# Patient Record
Sex: Male | Born: 1960 | Race: White | Hispanic: No | Marital: Single | Smoking: Never smoker
Health system: Southern US, Community
[De-identification: ages and names within clinical notes are randomized; demographics above are authoritative.]

## PROBLEM LIST (undated history)

## (undated) DIAGNOSIS — N320 Bladder-neck obstruction: Secondary | ICD-10-CM

## (undated) DIAGNOSIS — F329 Major depressive disorder, single episode, unspecified: Secondary | ICD-10-CM

## (undated) DIAGNOSIS — F419 Anxiety disorder, unspecified: Secondary | ICD-10-CM

## (undated) DIAGNOSIS — K219 Gastro-esophageal reflux disease without esophagitis: Secondary | ICD-10-CM

## (undated) DIAGNOSIS — K589 Irritable bowel syndrome without diarrhea: Secondary | ICD-10-CM

## (undated) DIAGNOSIS — F840 Autistic disorder: Secondary | ICD-10-CM

## (undated) DIAGNOSIS — J45909 Unspecified asthma, uncomplicated: Secondary | ICD-10-CM

## (undated) DIAGNOSIS — I10 Essential (primary) hypertension: Secondary | ICD-10-CM

## (undated) DIAGNOSIS — E785 Hyperlipidemia, unspecified: Secondary | ICD-10-CM

## (undated) DIAGNOSIS — J189 Pneumonia, unspecified organism: Secondary | ICD-10-CM

## (undated) DIAGNOSIS — G4733 Obstructive sleep apnea (adult) (pediatric): Secondary | ICD-10-CM

## (undated) DIAGNOSIS — D638 Anemia in other chronic diseases classified elsewhere: Secondary | ICD-10-CM

## (undated) DIAGNOSIS — N390 Urinary tract infection, site not specified: Secondary | ICD-10-CM

## (undated) DIAGNOSIS — K8020 Calculus of gallbladder without cholecystitis without obstruction: Secondary | ICD-10-CM

## (undated) DIAGNOSIS — E669 Obesity, unspecified: Secondary | ICD-10-CM

## (undated) DIAGNOSIS — F32A Depression, unspecified: Secondary | ICD-10-CM

## (undated) HISTORY — DX: Irritable bowel syndrome, unspecified: K58.9

## (undated) HISTORY — DX: Obstructive sleep apnea (adult) (pediatric): G47.33

## (undated) HISTORY — DX: Unspecified asthma, uncomplicated: J45.909

## (undated) HISTORY — DX: Anxiety disorder, unspecified: F41.9

## (undated) HISTORY — DX: Pneumonia, unspecified organism: J18.9

## (undated) HISTORY — DX: Obesity, unspecified: E66.9

## (undated) HISTORY — DX: Major depressive disorder, single episode, unspecified: F32.9

## (undated) HISTORY — DX: Calculus of gallbladder without cholecystitis without obstruction: K80.20

## (undated) HISTORY — DX: Essential (primary) hypertension: I10

## (undated) HISTORY — DX: Gastro-esophageal reflux disease without esophagitis: K21.9

## (undated) HISTORY — DX: Depression, unspecified: F32.A

## (undated) HISTORY — DX: Urinary tract infection, site not specified: N39.0

## (undated) HISTORY — DX: Hyperlipidemia, unspecified: E78.5

---

## 2004-05-14 ENCOUNTER — Ambulatory Visit: Payer: Self-pay | Admitting: Internal Medicine

## 2004-10-08 ENCOUNTER — Ambulatory Visit: Payer: Self-pay | Admitting: Internal Medicine

## 2005-03-25 ENCOUNTER — Ambulatory Visit: Payer: Self-pay | Admitting: Internal Medicine

## 2005-10-07 ENCOUNTER — Ambulatory Visit: Payer: Self-pay | Admitting: Internal Medicine

## 2006-04-07 ENCOUNTER — Ambulatory Visit: Payer: Self-pay | Admitting: Internal Medicine

## 2006-05-22 ENCOUNTER — Emergency Department (HOSPITAL_COMMUNITY): Admission: EM | Admit: 2006-05-22 | Discharge: 2006-05-22 | Payer: Self-pay | Admitting: Emergency Medicine

## 2006-07-21 ENCOUNTER — Ambulatory Visit: Payer: Self-pay | Admitting: Internal Medicine

## 2006-07-21 LAB — CONVERTED CEMR LAB
ALT: 17 units/L (ref 0–53)
Albumin: 3.7 g/dL (ref 3.5–5.2)
Alkaline Phosphatase: 82 units/L (ref 39–117)
BUN: 15 mg/dL (ref 6–23)
Basophils Absolute: 0 10*3/uL (ref 0.0–0.1)
Bilirubin, Direct: 0.1 mg/dL (ref 0.0–0.3)
CO2: 28 meq/L (ref 19–32)
Calcium: 9 mg/dL (ref 8.4–10.5)
Chloride: 107 meq/L (ref 96–112)
Cholesterol: 204 mg/dL (ref 0–200)
Eosinophils Absolute: 0.3 10*3/uL (ref 0.0–0.6)
Eosinophils Relative: 3.1 % (ref 0.0–5.0)
GFR calc Af Amer: 103 mL/min
Lymphocytes Relative: 16.5 % (ref 12.0–46.0)
Monocytes Absolute: 1.2 10*3/uL — ABNORMAL HIGH (ref 0.2–0.7)
Monocytes Relative: 14.2 % — ABNORMAL HIGH (ref 3.0–11.0)
Neutro Abs: 5.7 10*3/uL (ref 1.4–7.7)
PSA: 0.51 ng/mL (ref 0.10–4.00)
RBC: 4.5 M/uL (ref 4.22–5.81)
Sodium: 141 meq/L (ref 135–145)
TSH: 0.8 microintl units/mL (ref 0.35–5.50)
Total Bilirubin: 0.8 mg/dL (ref 0.3–1.2)
Total CHOL/HDL Ratio: 6

## 2007-02-16 ENCOUNTER — Ambulatory Visit: Payer: Self-pay | Admitting: Internal Medicine

## 2007-02-16 DIAGNOSIS — R079 Chest pain, unspecified: Secondary | ICD-10-CM | POA: Insufficient documentation

## 2007-02-16 DIAGNOSIS — G4710 Hypersomnia, unspecified: Secondary | ICD-10-CM | POA: Insufficient documentation

## 2007-02-16 DIAGNOSIS — J309 Allergic rhinitis, unspecified: Secondary | ICD-10-CM | POA: Insufficient documentation

## 2007-02-16 DIAGNOSIS — I10 Essential (primary) hypertension: Secondary | ICD-10-CM | POA: Insufficient documentation

## 2007-02-16 DIAGNOSIS — E785 Hyperlipidemia, unspecified: Secondary | ICD-10-CM | POA: Insufficient documentation

## 2007-02-16 DIAGNOSIS — F3289 Other specified depressive episodes: Secondary | ICD-10-CM | POA: Insufficient documentation

## 2007-02-16 DIAGNOSIS — F411 Generalized anxiety disorder: Secondary | ICD-10-CM | POA: Insufficient documentation

## 2007-02-16 DIAGNOSIS — K219 Gastro-esophageal reflux disease without esophagitis: Secondary | ICD-10-CM | POA: Insufficient documentation

## 2007-02-16 DIAGNOSIS — M722 Plantar fascial fibromatosis: Secondary | ICD-10-CM | POA: Insufficient documentation

## 2007-02-16 DIAGNOSIS — F329 Major depressive disorder, single episode, unspecified: Secondary | ICD-10-CM

## 2007-02-27 ENCOUNTER — Encounter: Payer: Self-pay | Admitting: Pulmonary Disease

## 2008-05-02 ENCOUNTER — Encounter (INDEPENDENT_AMBULATORY_CARE_PROVIDER_SITE_OTHER): Payer: Self-pay | Admitting: *Deleted

## 2008-05-02 ENCOUNTER — Ambulatory Visit: Payer: Self-pay | Admitting: Internal Medicine

## 2008-05-02 LAB — CONVERTED CEMR LAB
AST: 20 units/L (ref 0–37)
Albumin: 4 g/dL (ref 3.5–5.2)
Alkaline Phosphatase: 76 units/L (ref 39–117)
BUN: 17 mg/dL (ref 6–23)
Basophils Relative: 0.5 % (ref 0.0–3.0)
Bilirubin Urine: NEGATIVE
Bilirubin, Direct: 0 mg/dL (ref 0.0–0.3)
CO2: 28 meq/L (ref 19–32)
Chloride: 108 meq/L (ref 96–112)
Cholesterol: 178 mg/dL (ref 0–200)
Creatinine, Ser: 1 mg/dL (ref 0.4–1.5)
Eosinophils Absolute: 0.2 10*3/uL (ref 0.0–0.7)
Eosinophils Relative: 3.5 % (ref 0.0–5.0)
GFR calc non Af Amer: 84.66 mL/min (ref >60)
HDL: 32.4 mg/dL — ABNORMAL LOW (ref >39.00)
Hemoglobin, Urine: NEGATIVE
LDL Cholesterol: 107 mg/dL — ABNORMAL HIGH (ref 0–99)
Lymphs Abs: 1.4 10*3/uL (ref 0.7–4.0)
MCV: 86.4 fL (ref 78.0–100.0)
Neutro Abs: 3 10*3/uL (ref 1.4–7.7)
Nitrite: NEGATIVE
PSA: 0.35 ng/mL (ref 0.10–4.00)
Specific Gravity, Urine: 1.025 (ref 1.000–1.030)
TSH: 1.01 microintl units/mL (ref 0.35–5.50)
Total Bilirubin: 0.7 mg/dL (ref 0.3–1.2)
Total Protein, Urine: NEGATIVE mg/dL
Total Protein: 7.4 g/dL (ref 6.0–8.3)
pH: 5.5 (ref 5.0–8.0)

## 2008-06-11 ENCOUNTER — Inpatient Hospital Stay (HOSPITAL_COMMUNITY): Admission: EM | Admit: 2008-06-11 | Discharge: 2008-06-12 | Payer: Self-pay | Admitting: Emergency Medicine

## 2008-06-11 ENCOUNTER — Ambulatory Visit: Payer: Self-pay | Admitting: Internal Medicine

## 2008-07-04 ENCOUNTER — Ambulatory Visit: Payer: Self-pay | Admitting: Internal Medicine

## 2008-11-07 ENCOUNTER — Ambulatory Visit: Payer: Self-pay | Admitting: Internal Medicine

## 2009-06-20 ENCOUNTER — Ambulatory Visit: Payer: Self-pay | Admitting: Internal Medicine

## 2009-06-20 DIAGNOSIS — R1011 Right upper quadrant pain: Secondary | ICD-10-CM | POA: Insufficient documentation

## 2009-06-20 LAB — CONVERTED CEMR LAB
ALT: 21 units/L (ref 0–53)
Alkaline Phosphatase: 65 units/L (ref 39–117)
Basophils Absolute: 0 10*3/uL (ref 0.0–0.1)
Bilirubin Urine: NEGATIVE
CO2: 28 meq/L (ref 19–32)
Chloride: 101 meq/L (ref 96–112)
Cholesterol: 212 mg/dL — ABNORMAL HIGH (ref 0–200)
Creatinine, Ser: 0.9 mg/dL (ref 0.4–1.5)
Direct LDL: 129.1 mg/dL
Eosinophils Absolute: 0.2 10*3/uL (ref 0.0–0.7)
Eosinophils Relative: 3.6 % (ref 0.0–5.0)
Glucose, Bld: 76 mg/dL (ref 70–99)
Hemoglobin, Urine: NEGATIVE
Lymphocytes Relative: 31.1 % (ref 12.0–46.0)
Lymphs Abs: 2 10*3/uL (ref 0.7–4.0)
MCV: 87.5 fL (ref 78.0–100.0)
Neutro Abs: 3.5 10*3/uL (ref 1.4–7.7)
Platelets: 297 10*3/uL (ref 150.0–400.0)
Potassium: 4 meq/L (ref 3.5–5.1)
RBC: 4.66 M/uL (ref 4.22–5.81)
RDW: 13.4 % (ref 11.5–14.6)
Sodium: 139 meq/L (ref 135–145)
Specific Gravity, Urine: 1.025 (ref 1.000–1.030)
TSH: 1.43 microintl units/mL (ref 0.35–5.50)
Urobilinogen, UA: 0.2 (ref 0.0–1.0)
WBC: 6.5 10*3/uL (ref 4.5–10.5)

## 2009-07-05 ENCOUNTER — Ambulatory Visit: Payer: Self-pay | Admitting: Pulmonary Disease

## 2009-07-06 ENCOUNTER — Telehealth: Payer: Self-pay | Admitting: Internal Medicine

## 2009-07-10 ENCOUNTER — Ambulatory Visit: Payer: Self-pay | Admitting: Internal Medicine

## 2009-07-17 ENCOUNTER — Ambulatory Visit: Payer: Self-pay | Admitting: Internal Medicine

## 2009-08-14 ENCOUNTER — Encounter: Payer: Self-pay | Admitting: Pulmonary Disease

## 2009-08-14 ENCOUNTER — Ambulatory Visit (HOSPITAL_BASED_OUTPATIENT_CLINIC_OR_DEPARTMENT_OTHER): Admission: RE | Admit: 2009-08-14 | Discharge: 2009-08-14 | Payer: Self-pay | Admitting: Pulmonary Disease

## 2009-08-14 DIAGNOSIS — G4733 Obstructive sleep apnea (adult) (pediatric): Secondary | ICD-10-CM | POA: Insufficient documentation

## 2009-08-21 ENCOUNTER — Ambulatory Visit: Payer: Self-pay | Admitting: Internal Medicine

## 2009-08-23 ENCOUNTER — Ambulatory Visit: Payer: Self-pay | Admitting: Pulmonary Disease

## 2009-09-04 ENCOUNTER — Ambulatory Visit: Payer: Self-pay | Admitting: Internal Medicine

## 2009-09-15 ENCOUNTER — Emergency Department (HOSPITAL_COMMUNITY): Admission: EM | Admit: 2009-09-15 | Discharge: 2009-09-16 | Payer: Self-pay | Admitting: Emergency Medicine

## 2009-10-02 ENCOUNTER — Ambulatory Visit: Payer: Self-pay | Admitting: Internal Medicine

## 2009-11-28 ENCOUNTER — Ambulatory Visit: Payer: Self-pay | Admitting: Internal Medicine

## 2010-02-11 ENCOUNTER — Encounter: Payer: Self-pay | Admitting: Internal Medicine

## 2010-02-16 ENCOUNTER — Emergency Department (HOSPITAL_COMMUNITY)
Admission: EM | Admit: 2010-02-16 | Discharge: 2010-02-17 | Payer: Self-pay | Source: Home / Self Care | Admitting: Emergency Medicine

## 2010-02-21 NOTE — Assessment & Plan Note (Signed)
Summary: depression/offered sda - pt requested this date/cd   Vital Signs:  Patient profile:   50 year old male Height:      70 inches Weight:      254 pounds BMI:     36.58 O2 Sat:      95 % on Room air Temp:     97.5 degrees F oral Pulse rate:   85 / minute BP sitting:   122 / 80  (left arm) Cuff size:   large  Vitals Entered ByZella Ball Ewing (Jun 20, 2009 10:56 AM)  O2 Flow:  Room air  CC: Depression/RE   CC:  Depression/RE.  History of Present Illness: out of sertraline for approx 1 mo, as unbeknownst to [place] [facility] (mother goes there too);  so depressive symtpoms worsened as he was out of the med; overall good compliacne and tolerance in the past;    here with wt gain, and also pain to RUQ for at least 2 months, mild, intermittent, worse to lie on the right side, currently sleeping on the floor of his one BR apt he shares with mother; no n/v, wt loss, bowel change, or GU symtpoms such as freq, urgency, dysuria or hematuria; no fever, trauma, wt loss, night sweats, or other constitutional symtpoms  Pt denies CP, sob, doe, wheezing, orthopnea, pnd, worsening LE edema, palps, dizziness or syncope   Pt denies new neuro symptoms such as headache, facial or extremity weakness    Preventive Screening-Counseling & Management      Drug Use:  no.    Problems Prior to Update: 1)  Ruq Pain  (ICD-789.01) 2)  Plantar Fasciitis, Left  (ICD-728.71) 3)  Preventive Health Care  (ICD-V70.0) 4)  Fasciitis, Plantar  (ICD-728.71) 5)  Allergic Rhinitis  (ICD-477.9) 6)  Chest Pain  (ICD-786.50) 7)  Hypersomnia  (ICD-780.54) 8)  Hypertension  (ICD-401.9) 9)  Family History of Alcoholism/addiction  (ICD-V61.41) 10)  Anxiety  (ICD-300.00) 11)  Hyperlipidemia  (ICD-272.4) 12)  Gerd  (ICD-530.81) 13)  Depression  (ICD-311)  Medications Prior to Update: 1)  Alprazolam 0.5 Mg  Tabs (Alprazolam) .Marland Kitchen.. 1 By Mouth Two Times A Day As Needed 2)  Sertraline Hcl 100  Mg Tabs (Sertraline Hcl) .... 2 By Mouth Once Daily 3)  Meloxicam 15 Mg Tabs (Meloxicam) .... Take 1 Tablet By Mouth Once A Day As Needed Pain 4)  Lisinopril 20 Mg  Tabs (Lisinopril) .Marland Kitchen.. 1 By Mouth Once Daily 5)  Fluticasone Propionate 50 Mcg/act  Susp (Fluticasone Propionate) .... 2 Spray/side Once Daily For Allergies 6)  Simvastatin 40 Mg  Tabs (Simvastatin) .Marland Kitchen.. 1 By Mouth Once Daily 7)  Adult Aspirin Ec Low Strength 81 Mg Tbec (Aspirin) .Marland Kitchen.. 1 By Mouth Once Daily  Current Medications (verified): 1)  Alprazolam 0.5 Mg  Tabs (Alprazolam) .Marland Kitchen.. 1 By Mouth Two Times A Day As Needed 2)  Sertraline Hcl 100 Mg Tabs (Sertraline Hcl) .... 2 By Mouth Once Daily 3)  Meloxicam 15 Mg Tabs (Meloxicam) .... Take 1 Tablet By Mouth Once A Day As Needed Pain 4)  Lisinopril 20 Mg  Tabs (Lisinopril) .Marland Kitchen.. 1 By Mouth Once Daily 5)  Fluticasone Propionate 50 Mcg/act  Susp (Fluticasone Propionate) .... 2 Spray/side Once Daily For Allergies 6)  Simvastatin 40 Mg  Tabs (Simvastatin) .Marland Kitchen.. 1 By Mouth Once Daily 7)  Adult Aspirin Ec Low Strength 81 Mg Tbec (Aspirin) .Marland Kitchen.. 1 By Mouth Once Daily  Allergies (verified): 1)  Prozac  Past History:  Past Medical History: Last updated: 02/16/2007 Depression GERD Hyperlipidemia Anxiety Hypertension Allergic rhinitis  Past Surgical History: Last updated: 02/16/2007 Denies surgical history  Family History: Last updated: 02/16/2007 Family History of Alcoholism/Addiction Family History of Arthritis breast cancer prostate cancer Family History High cholesterol heart disease stroke kidney disease DM pancreatic cancer - uncle  Social History: Last updated: 06/20/2009 Never Smoked Alcohol use-no Single no children Drug use-no  Risk Factors: Smoking Status: never (02/16/2007)  Social History: Reviewed history from 02/16/2007 and no changes required. Never Smoked Alcohol use-no Single no children Drug use-no Drug Use:  no  Review of  Systems  The patient denies anorexia, fever, vision loss, decreased hearing, hoarseness, chest pain, syncope, dyspnea on exertion, peripheral edema, prolonged cough, headaches, hemoptysis, melena, hematochezia, severe indigestion/heartburn, hematuria, muscle weakness, suspicious skin lesions, transient blindness, difficulty walking, unusual weight change, abnormal bleeding, enlarged lymph nodes, and angioedema.         all otherwise negative per pt -    Physical Exam  General:  alert and overweight-appearing.   Head:  normocephalic and atraumatic.   Eyes:  vision grossly intact, pupils equal, and pupils round.   Ears:  R ear normal and L ear normal.   Nose:  no external deformity and no nasal discharge.   Mouth:  no gingival abnormalities and pharynx pink and moist.   Neck:  supple and no masses.   Lungs:  normal respiratory effort and normal breath sounds.   Heart:  normal rate and regular rhythm.   Abdomen:  soft, non-tender, and normal bowel sounds.  except for midl ruq /costal tenderness Msk:  no joint tenderness and no joint swelling.   Extremities:  no edema, no erythema  Neurologic:  cranial nerves II-XII intact and strength normal in all extremities.   Skin:  color normal and no rashes.   Psych:  depressed affect and moderately anxious.     Impression & Recommendations:  Problem # 1:  Preventive Health Care (ICD-V70.0)  Overall doing well, age appropriate education and counseling updated and referral for appropriate preventive services done unless declined, immunizations up to date or declined, diet counseling done if overweight, urged to quit smoking if smokes , most recent labs reviewed and current ordered if appropriate, ecg reviewed or declined (interpretation per ECG scanned in the EMR if done); information regarding Medicare Prevention requirements given if appropriate; speciality referrals updated as appropriate   Orders: TLB-BMP (Basic Metabolic Panel-BMET)  (80048-METABOL) TLB-CBC Platelet - w/Differential (85025-CBCD) TLB-Hepatic/Liver Function Pnl (80076-HEPATIC) TLB-Lipid Panel (80061-LIPID) TLB-TSH (Thyroid Stimulating Hormone) (84443-TSH) TLB-PSA (Prostate Specific Antigen) (84153-PSA) TLB-Udip ONLY (81003-UDIP)  Problem # 2:  DEPRESSION (ICD-311)  His updated medication list for this problem includes:    Alprazolam 0.5 Mg Tabs (Alprazolam) .Marland Kitchen... 1 by mouth two times a day as needed    Sertraline Hcl 100 Mg Tabs (Sertraline hcl) .Marland Kitchen... 2 by mouth once daily to re-start the sertraline  Problem # 3:  HYPERSOMNIA (ICD-780.54) he agrees now to referral Orders: Pulmonary Referral (Pulmonary)  Problem # 4:  RUQ PAIN (ICD-789.01) ? msk - for abd u/s, and labs above Orders: Radiology Referral (Radiology)  Complete Medication List: 1)  Alprazolam 0.5 Mg Tabs (Alprazolam) .Marland Kitchen.. 1 by mouth two times a day as needed 2)  Sertraline Hcl 100 Mg Tabs (Sertraline hcl) .... 2 by mouth once daily 3)  Meloxicam 15 Mg Tabs (Meloxicam) .... Take 1 tablet by mouth once a day as needed pain 4)  Lisinopril 20 Mg  Tabs (Lisinopril) .Marland Kitchen.. 1 by mouth once daily 5)  Fluticasone Propionate 50 Mcg/act Susp (Fluticasone propionate) .... 2 spray/side once daily for allergies 6)  Simvastatin 40 Mg Tabs (Simvastatin) .Marland Kitchen.. 1 by mouth once daily 7)  Adult Aspirin Ec Low Strength 81 Mg Tbec (Aspirin) .Marland Kitchen.. 1 by mouth once daily  Patient Instructions: 1)  Continue all previous medications as before this visit , including re-starting the sertraline 2)  You will be contacted about the referral(s) to: pulmonary for the possible sleep apnea, and the ultrasound for the right side pain 3)  Please go to the Lab in the basement for your blood and/or urine tests today 4)  Please schedule a follow-up appointment in 1 year or sooner if needed Prescriptions: SIMVASTATIN 40 MG  TABS (SIMVASTATIN) 1 by mouth once daily  #90 x 3   Entered and Authorized by:   Corwin Levins MD    Signed by:   Corwin Levins MD on 06/20/2009   Method used:   Electronically to        [facility] [phone]* (retail)       [address]       [place], [place]  [address]       Ph: [phone]       Fax: [phone]   RxID:   [id number] FLUTICASONE PROPIONATE 50 MCG/ACT  SUSP (FLUTICASONE PROPIONATE) 2 spray/side once daily for allergies  #3 x 3   Entered and Authorized by:   Corwin Levins MD   Signed by:   Corwin Levins MD on 06/20/2009   Method used:   Electronically to        [facility]. [phone]* (retail)       [address]       [place], [place]  [address]       Ph: [phone]       Fax: [phone]   RxID:   [id number] LISINOPRIL 20 MG  TABS (LISINOPRIL) 1 by mouth once daily  #90 x 3   Entered and Authorized by:   Corwin Levins MD   Signed by:   Corwin Levins MD on 06/20/2009   Method used:   Electronically to        [facility]. [phone]* (retail)       [address]       [place], [place]  [address]       Ph: [phone]       Fax: [phone]   RxID:   [id number] MELOXICAM 15 MG TABS (MELOXICAM) Take 1 tablet by mouth once a day as needed pain  #90 x 3   Entered and Authorized by:   Corwin Levins MD   Signed by:   Corwin Levins MD on 06/20/2009   Method used:   Electronically to        [facility] [phone]* (retail)       [address]       [place], [place]  [address]       Ph: [phone]       Fax: [phone]   RxID:   [id number] SERTRALINE HCL 100 MG TABS (SERTRALINE HCL) 2 by mouth once daily  #180 x 3   Entered and Authorized by:  Corwin Levins MD   Signed by:   Corwin Levins MD on 06/20/2009   Method used:   Electronically to        [facility] [phone]* (retail)       [address]       [place], [place]  [address]       Ph: [phone]       Fax: [phone]   RxID:    [id number]

## 2010-02-21 NOTE — Progress Notes (Signed)
----   Converted from flag ---- ---- 07/06/2009 3:15 PM, Phillip Levins MD wrote: will try to have him return for appt  - likely worsening depression with ? psychosis, ? paranoia  robin - to call pt- needs ROV  ---- 07/06/2009 2:01 PM, Phillip Helling MD wrote: Phillip Butler  I meet Phillip Butler yesterday.  I don't know what his usual personality is like.  However, yesterday he seemed to have very odd behavior.  He seemed distracted, withdrawn, and had a flat affect.  He persistently repeated his statements.    He would interject comments that didn't really seem to pertain to what we were otherwise discussing.  For example, I was explaining to him how sleep apnea can affect his health.  He interupted me to let me know he liked my socks, and then proceed to tell me how he had plantar fascitis after showing me his foot.    He also commented that he sometimes would hear his neighbors talking about him through the walls of his apartment.  He says he is not taking his anti-depressant medications and can't afford them.    I will defer to you about what other interventions he may need.  Phillip Butler ------------------------------  called pt left msg to call back 07/06/2009  Patient returned call, informed to schedule an OV with Phillip Butler, he agreed and transfered to a scheduler and pt schedule appt. for July 10, 2009.

## 2010-02-21 NOTE — Miscellaneous (Signed)
Summary: Sleep study results   Clinical Lists Changes AHI 91, oxygen nadir 72%.  CPAP 13 cm h2o with AHI down to 13 (mostly central's).  Observed in REM and supine.  PLMI 32.8 in titration portion.  Results d/w pt over the phone.  Will set up CPAP 13 cm h2o and arrange for rov in 6 to 8 weeks after set up. Problems: Added new problem of OBSTRUCTIVE SLEEP APNEA (ICD-327.23) Orders: Added new Referral order of DME Referral (DME) - Signed

## 2010-02-21 NOTE — Assessment & Plan Note (Signed)
Summary: FOLLOW UP-LB   Vital Signs:  Patient profile:   50 year old male Height:      70 inches Weight:      262 pounds BMI:     37.73 O2 Sat:      95 % on Room air Temp:     97.3 degrees F oral Pulse rate:   89 / minute BP sitting:   148 / 90  (left arm) Cuff size:   large  Vitals Entered By: Zella Ball Ewing CMA Duncan Dull) (September 04, 2009 10:37 AM)  O2 Flow:  Room air CC: followup/RE   Primary Care Provider:  Dr. Oliver Barre  CC:  followup/RE.  History of Present Illness: here to f/u; still marked fatigue;  has OSA but not yet tried the CPAP a night for some reason - pt vague on this;  has not been taking several of his meds including the lisinorpil and the fluoxetine - too expensive per pt - goes to [facility] and using his ins copay.  Pt denies CP, sob, doe, wheezing, orthopnea, pnd, worsening LE edema, palps, dizziness or syncope  Pt denies new neuro symptoms such as headache, facial or extremity weakness exceot c/o RLS type symptoms to getting to sleep and during the night.  Has persitent depressive symtpoms, but no suicidal ideation , incr anxiety or panic.  Hard to lose wt, too tired most diays.  Denies onset polydipsia or polyuria.  Does also have significant bilat ear discomfort, right more than left without hearing loss, n/v or vertigo;  has ongoing nasal allergy symtpoms but again not using meds due to cost.    Problems Prior to Update: 1)  Obstructive Sleep Apnea  (ICD-327.23) 2)  Ruq Pain  (ICD-789.01) 3)  Plantar Fasciitis, Left  (ICD-728.71) 4)  Preventive Health Care  (ICD-V70.0) 5)  Fasciitis, Plantar  (ICD-728.71) 6)  Allergic Rhinitis  (ICD-477.9) 7)  Chest Pain  (ICD-786.50) 8)  Hypersomnia  (ICD-780.54) 9)  Hypertension  (ICD-401.9) 10)  Family History of Alcoholism/addiction  (ICD-V61.41) 11)  Anxiety  (ICD-300.00) 12)  Hyperlipidemia  (ICD-272.4) 13)  Gerd  (ICD-530.81) 14)  Depression  (ICD-311)  Medications Prior to Update: 1)  Alprazolam 0.5 Mg  Tabs  (Alprazolam) .Marland Kitchen.. 1 By Mouth Two Times A Day As Needed 2)  Fluoxetine Hcl 20 Mg Caps (Fluoxetine Hcl) .Marland Kitchen.. 1 By Mouth Once Daily 3)  Meloxicam 15 Mg Tabs (Meloxicam) .... Take 1 Tablet By Mouth Once A Day As Needed Pain 4)  Lisinopril 20 Mg  Tabs (Lisinopril) .Marland Kitchen.. 1 By Mouth Once Daily 5)  Fluticasone Propionate 50 Mcg/act  Susp (Fluticasone Propionate) .... 2 Spray/side Once Daily For Allergies 6)  Simvastatin 40 Mg  Tabs (Simvastatin) .Marland Kitchen.. 1 By Mouth Once Daily 7)  Adult Aspirin Ec Low Strength 81 Mg Tbec (Aspirin) .Marland Kitchen.. 1 By Mouth Once Daily 8)  Tums 500 Mg Chew (Calcium Carbonate Antacid) .... As Needed 9)  St Johns Wort 300 Mg Caps ([address] Johns Wort) .Marland Kitchen.. 1 By Mouth Three Times A Day  Current Medications (verified): 1)  Fluoxetine Hcl 20 Mg Caps (Fluoxetine Hcl) .Marland Kitchen.. 1 By Mouth Once Daily 2)  Diclofenac Sodium 75 Mg Tbec (Diclofenac Sodium) .Marland Kitchen.. 1po Two Times A Day As Needed 3)  Lisinopril 20 Mg  Tabs (Lisinopril) .Marland Kitchen.. 1 By Mouth Once Daily 4)  Fluticasone Propionate 50 Mcg/act  Susp (Fluticasone Propionate) .... 2 Spray/side Once Daily For Allergies 5)  Pravastatin Sodium 40 Mg Tabs (Pravastatin Sodium) .Marland Kitchen.. 1po Once Daily 6)  Adult Aspirin Ec Low Strength 81 Mg Tbec (Aspirin) .Marland Kitchen.. 1 By Mouth Once Daily 7)  Tums 500 Mg Chew (Calcium Carbonate Antacid) .... As Needed 8)  St Johns Wort 300 Mg Caps ([address] Johns Wort) .Marland Kitchen.. 1 By Mouth Three Times A Day 9)  Fexofenadine Hcl 180 Mg Tabs (Fexofenadine Hcl) .Marland Kitchen.. 1po Once Daily  Allergies (verified): 1)  Prozac  Past History:  Past Surgical History: Last updated: 02/16/2007 Denies surgical history  Social History: Last updated: 06/20/2009 Never Smoked Alcohol use-no Single no children Drug use-no  Risk Factors: Alcohol Use: 0 (07/05/2009)  Risk Factors: Smoking Status: never (07/05/2009)  Past Medical History: Depression GERD Hyperlipidemia Anxiety Hypertension Allergic rhinitis OSA  Review of Systems       all otherwise  negative per pt -    Physical Exam  General:  alert and overweight-appearing.  , appears very fatigued and slow mentation Head:  normocephalic and atraumatic.   Eyes:  vision grossly intact, pupils equal, and pupils round.   Ears:  bilat tm's mild erythema, right worse than left Nose:  no external deformity and no nasal discharge.   Mouth:  no gingival abnormalities and pharynx pink and moist.   Neck:  supple and no masses.   Lungs:  normal respiratory effort and normal breath sounds.   Heart:  normal rate and regular rhythm.   Abdomen:  soft, non-tender, and normal bowel sounds.   Extremities:  no edema, no erythema    Impression & Recommendations:  Problem # 1:  OBSTRUCTIVE SLEEP APNEA (ICD-327.23) encouraged pt to start the CPAP - f/u with pulm if does not work out, I suspect his fatigue is likely mostly related to this and deconditiong  Problem # 2:  ALLERGIC RHINITIS (ICD-477.9)  His updated medication list for this problem includes:    Fluticasone Propionate 50 Mcg/act Susp (Fluticasone propionate) .Marland Kitchen... 2 spray/side once daily for allergies    Fexofenadine Hcl 180 Mg Tabs (Fexofenadine hcl) .Marland Kitchen... 1po once daily re-start med, also for generic allegra, and mucinx as needed for ear discomfort as well/eustachain symptoms on the right  Problem # 3:  HYPERTENSION (ICD-401.9)  His updated medication list for this problem includes:    Lisinopril 20 Mg Tabs (Lisinopril) .Marland Kitchen... 1 by mouth once daily re-start med - to change pharmacy to [facility] for cash pay which will be less expensive  BP today: 148/90 Prior BP: 146/100 (07/17/2009)  Labs Reviewed: K+: 4.0 (06/20/2009) Creat: : 0.9 (06/20/2009)   Chol: 212 (06/20/2009)   HDL: 36.00 (06/20/2009)   LDL: 107 (05/02/2008)   TG: 416.0 (06/20/2009)  Problem # 4:  DEPRESSION (ICD-311)  The following medications were removed from the medication list:    Alprazolam 0.5 Mg Tabs (Alprazolam) .Marland Kitchen... 1 by mouth two times a day as  needed His updated medication list for this problem includes:    Fluoxetine Hcl 20 Mg Caps (Fluoxetine hcl) .Marland Kitchen... 1 by mouth once daily to start the fluoxetine  - to send to [facility] as above to help with cost, declines counseling at this time  Complete Medication List: 1)  Fluoxetine Hcl 20 Mg Caps (Fluoxetine hcl) .Marland Kitchen.. 1 by mouth once daily 2)  Diclofenac Sodium 75 Mg Tbec (Diclofenac sodium) .Marland Kitchen.. 1po two times a day as needed 3)  Lisinopril 20 Mg Tabs (Lisinopril) .Marland Kitchen.. 1 by mouth once daily 4)  Fluticasone Propionate 50 Mcg/act Susp (Fluticasone propionate) .... 2 spray/side once daily for allergies 5)  Pravastatin Sodium 40 Mg Tabs (Pravastatin sodium) .Marland KitchenMarland KitchenMarland Kitchen  1po once daily 6)  Adult Aspirin Ec Low Strength 81 Mg Tbec (Aspirin) .Marland Kitchen.. 1 by mouth once daily 7)  Tums 500 Mg Chew (Calcium carbonate antacid) .... As needed 8)  [employer] Wort 300 Mg Caps ([address] johns wort) .Marland Kitchen.. 1 by mouth three times a day 9)  Fexofenadine Hcl 180 Mg Tabs (Fexofenadine hcl) .Marland Kitchen.. 1po once daily  Patient Instructions: 1)  All of your prescriptions were sent to [facility] on [address], except you need to stop the meloxicam (for pain) and the simvastatin (for cholesterol) 2)  The meloxicam was changed to diclofenac as needed for pain, and the simvastatin was changed to pravastatin for cholesterol, to help with the cost 3)  All of your prescriptions should be on the $4 list (cash) at [facility] except for the generic allegrea (fexofenadine) and the nasal spray for allergies (you will likely need to use your [facility] plan for the copay on those) 4)  You can also use Mucinex OTC or it's generic for congestion  5)  Please start the CPAP as per Dr Craige Cotta 6)  Please schedule a follow-up appointment in 3 months with: 7)  Hepatic Panel prior to visit, ICD-9: v58.69 8)  Lipid Panel prior to visit, ICD-9: 272.0 Prescriptions: DICLOFENAC SODIUM 75 MG TBEC (DICLOFENAC SODIUM) 1po two times a day as needed  #60 x 2   Entered and  Authorized by:   Corwin Levins MD   Signed by:   Corwin Levins MD on 09/04/2009   Method used:   Electronically to        [facility]  [phone]* (retail)       [address]       [place], [place]  [address]       Ph: [phone] or [phone]       Fax: [phone]   RxID:   [id number] FEXOFENADINE HCL 180 MG TABS (FEXOFENADINE HCL) 1po once daily  #30 x 11   Entered and Authorized by:   Corwin Levins MD   Signed by:   Corwin Levins MD on 09/04/2009   Method used:   Electronically to        [facility]  [phone]* (retail)       [address]       [place], [place]  [address]       Ph: [phone] or [phone]       Fax: [phone]   RxID:   [id number] PRAVASTATIN SODIUM 40 MG TABS (PRAVASTATIN SODIUM) 1po once daily  #90 x 3   Entered and Authorized by:   Corwin Levins MD   Signed by:   Corwin Levins MD on 09/04/2009   Method used:   Electronically to        [facility]  [phone]* (retail)       [address]       [place], [place]  [address]       Ph: [phone] or [phone]       Fax: [phone]   RxID:   [id number] FLUTICASONE PROPIONATE 50 MCG/ACT  SUSP (FLUTICASONE PROPIONATE) 2 spray/side once daily for allergies  #1 x 11   Entered and Authorized by:   Corwin Levins MD   Signed by:   Corwin Levins MD on 09/04/2009   Method used:  Electronically to        [facility]  [phone]* (retail)       [address]       [place], [place]  [address]       Ph: [phone] or [phone]       Fax: [phone]   RxID:   [id number] LISINOPRIL 20 MG  TABS (LISINOPRIL) 1 by mouth once daily  #90 x 3   Entered and Authorized by:   Corwin Levins MD   Signed by:   Corwin Levins MD on 09/04/2009   Method used:   Electronically to        [facility]  [phone]* (retail)       [address]        [place], [place]  [address]       Ph: [phone] or [phone]       Fax: [phone]   RxID:   [id number] MELOXICAM 15 MG TABS (MELOXICAM) Take 1 tablet by mouth once a day as needed pain  #90 x 3   Entered and Authorized by:   Corwin Levins MD   Signed by:   Corwin Levins MD on 09/04/2009   Method used:   Electronically to        [facility]  [phone]* (retail)       [address]       [place], [place]  [address]       Ph: [phone] or [phone]       Fax: [phone]   RxID:   [id number] FLUOXETINE HCL 20 MG CAPS (FLUOXETINE HCL) 1 by mouth once daily  #90 x 3   Entered and Authorized by:   Corwin Levins MD   Signed by:   Corwin Levins MD on 09/04/2009   Method used:   Electronically to        [facility]  [phone]* (retail)       [address]       [place], [place]  [address]       Ph: [phone] or [phone]       Fax: [phone]   RxID:   [id number]

## 2010-02-21 NOTE — Progress Notes (Signed)
----   Converted from flag ---- ---- 07/06/2009 4:13 PM, Coralyn Helling MD wrote: That is what I am worried about.  Thanks.  ---- 07/06/2009 3:15 PM, Corwin Levins MD wrote: will try to have him return for appt  - likely worsening depression with ? psychosis, ? paranoia  robin - to call pt- needs ROV  ---- 07/06/2009 2:01 PM, Coralyn Helling MD wrote: Phillip Butler  I meet Phillip Butler yesterday.  I don't know what his usual personality is like.  However, yesterday he seemed to have very odd behavior.  He seemed distracted, withdrawn, and had a flat affect.  He persistently repeated his statements.    He would interject comments that didn't really seem to pertain to what we were otherwise discussing.  For example, I was explaining to him how sleep apnea can affect his health.  He interupted me to let me know he liked my socks, and then proceed to tell me how he had plantar fascitis after showing me his foot.    He also commented that he sometimes would hear his neighbors talking about him through the walls of his apartment.  He says he is not taking his anti-depressant medications and can't afford them.    I will defer to you about what other interventions he may need.  Vineet ------------------------------

## 2010-02-21 NOTE — Assessment & Plan Note (Signed)
Summary: depression/lb   Vital Signs:  Patient profile:   50 year old male Height:      70 inches Weight:      252.50 pounds BMI:     36.36 O2 Sat:      96 % on Room air Temp:     99.8 degrees F oral Pulse rate:   91 / minute BP sitting:   146 / 100  (left arm) Cuff size:   large  Vitals Entered By: Zella Ball Ewing CMA Duncan Dull) (July 17, 2009 10:41 AM)  O2 Flow:  Room air CC: Depression/RE   Primary Care Provider:  Dr. Oliver Barre  CC:  Depression/RE.  History of Present Illness: here unfortunately with severe financial constraints, has been unable to afford the sertraline for approx 1 month, so he also gave up taking his other meds as well.  Pt denies CP, sob, doe, wheezing, orthopnea, pnd, worsening LE edema, palps, dizziness or syncope  Pt denies new neuro symptoms such as headache, facial or extremity weakness  Pt denies polydipsia, polyuria, or low sugar symptoms such as shakiness improved with eating.  But has had gradaul worsening depressive symptoms over the past 2 wks with low mood, tearfulness , lack of energy, wt gain, sleeps more, but no increased suicidal ideation or anxiety.  Also with mild nasal allergy itch, sneeze and congestion since last visit without headache, sinus pain, colored d/c or fever or ST.    Problems Prior to Update: 1)  Ruq Pain  (ICD-789.01) 2)  Plantar Fasciitis, Left  (ICD-728.71) 3)  Preventive Health Care  (ICD-V70.0) 4)  Fasciitis, Plantar  (ICD-728.71) 5)  Allergic Rhinitis  (ICD-477.9) 6)  Chest Pain  (ICD-786.50) 7)  Hypersomnia  (ICD-780.54) 8)  Hypertension  (ICD-401.9) 9)  Family History of Alcoholism/addiction  (ICD-V61.41) 10)  Anxiety  (ICD-300.00) 11)  Hyperlipidemia  (ICD-272.4) 12)  Gerd  (ICD-530.81) 13)  Depression  (ICD-311)  Medications Prior to Update: 1)  Alprazolam 0.5 Mg  Tabs (Alprazolam) .Marland Kitchen.. 1 By Mouth Two Times A Day As Needed 2)  Sertraline Hcl 100 Mg Tabs (Sertraline Hcl) .... 2 By Mouth Once Daily 3)  Meloxicam 15  Mg Tabs (Meloxicam) .... Take 1 Tablet By Mouth Once A Day As Needed Pain 4)  Lisinopril 20 Mg  Tabs (Lisinopril) .Marland Kitchen.. 1 By Mouth Once Daily 5)  Fluticasone Propionate 50 Mcg/act  Susp (Fluticasone Propionate) .... 2 Spray/side Once Daily For Allergies 6)  Simvastatin 40 Mg  Tabs (Simvastatin) .Marland Kitchen.. 1 By Mouth Once Daily 7)  Adult Aspirin Ec Low Strength 81 Mg Tbec (Aspirin) .Marland Kitchen.. 1 By Mouth Once Daily 8)  Tums 500 Mg Chew (Calcium Carbonate Antacid) .... As Needed 9)  St Johns Wort 300 Mg Caps ([address] Johns Wort) .Marland Kitchen.. 1 By Mouth Three Times A Day  Current Medications (verified): 1)  Alprazolam 0.5 Mg  Tabs (Alprazolam) .Marland Kitchen.. 1 By Mouth Two Times A Day As Needed 2)  Fluoxetine Hcl 20 Mg Caps (Fluoxetine Hcl) .Marland Kitchen.. 1 By Mouth Once Daily 3)  Meloxicam 15 Mg Tabs (Meloxicam) .... Take 1 Tablet By Mouth Once A Day As Needed Pain 4)  Lisinopril 20 Mg  Tabs (Lisinopril) .Marland Kitchen.. 1 By Mouth Once Daily 5)  Fluticasone Propionate 50 Mcg/act  Susp (Fluticasone Propionate) .... 2 Spray/side Once Daily For Allergies 6)  Simvastatin 40 Mg  Tabs (Simvastatin) .Marland Kitchen.. 1 By Mouth Once Daily 7)  Adult Aspirin Ec Low Strength 81 Mg Tbec (Aspirin) .Marland Kitchen.. 1 By Mouth Once Daily 8)  Tums 500 Mg Chew (Calcium Carbonate Antacid) .... As Needed 9)  St Johns Wort 300 Mg Caps ([address] Johns Wort) .Marland Kitchen.. 1 By Mouth Three Times A Day  Allergies (verified): 1)  Prozac  Past History:  Past Medical History: Last updated: 02/16/2007 Depression GERD Hyperlipidemia Anxiety Hypertension Allergic rhinitis  Past Surgical History: Last updated: 02/16/2007 Denies surgical history  Social History: Last updated: 06/20/2009 Never Smoked Alcohol use-no Single no children Drug use-no  Risk Factors: Alcohol Use: 0 (07/05/2009)  Risk Factors: Smoking Status: never (07/05/2009)  Review of Systems       all otherwise negative per pt -    Physical Exam  General:  alert and overweight-appearing.   Head:  normocephalic and  atraumatic.   Eyes:  vision grossly intact, pupils equal, and pupils round.   Ears:  R ear normal and L ear normal.   Nose:  nasal dischargemucosal pallor and mucosal edema.   Mouth:  no gingival abnormalities and pharynx pink and moist.   Neck:  supple and no masses.   Lungs:  normal respiratory effort and normal breath sounds.   Heart:  normal rate and regular rhythm.   Abdomen:  soft, non-tender, and normal bowel sounds.   Extremities:  no edema, no erythema  Psych:  depressed affect, flat affect, and slightly anxious.     Impression & Recommendations:  Problem # 1:  HYPERTENSION (ICD-401.9)  His updated medication list for this problem includes:    Lisinopril 20 Mg Tabs (Lisinopril) .Marland Kitchen... 1 by mouth once daily encouraged pt to re-start med - should be on the $4 list at [facility]  BP today: 146/100 Prior BP: 120/78 (07/05/2009)  Labs Reviewed: K+: 4.0 (06/20/2009) Creat: : 0.9 (06/20/2009)   Chol: 212 (06/20/2009)   HDL: 36.00 (06/20/2009)   LDL: 107 (05/02/2008)   TG: 416.0 (06/20/2009)  Problem # 2:  DEPRESSION (ICD-311)  His updated medication list for this problem includes:    Alprazolam 0.5 Mg Tabs (Alprazolam) .Marland Kitchen... 1 by mouth two times a day as needed    Fluoxetine Hcl 20 Mg Caps (Fluoxetine hcl) .Marland Kitchen... 1 by mouth once daily treat as above, f/u any worsening signs or symptoms - with the prozac should be on the $4 list at [facility] and he thinks he can afford this;  to consider ER or [facility] for evaluation if onset suicidal ideation , or hallucinations  Problem # 3:  ALLERGIC RHINITIS (ICD-477.9)  His updated medication list for this problem includes:    Fluticasone Propionate 50 Mcg/act Susp (Fluticasone propionate) .Marland Kitchen... 2 spray/side once daily for allergies adivsed pt, if unable to afford, should try OTC claritin as needed or allegra  Complete Medication List: 1)  Alprazolam 0.5 Mg Tabs (Alprazolam) .Marland Kitchen.. 1 by mouth two times a day as needed 2)  Fluoxetine Hcl 20 Mg  Caps (Fluoxetine hcl) .Marland Kitchen.. 1 by mouth once daily 3)  Meloxicam 15 Mg Tabs (Meloxicam) .... Take 1 tablet by mouth once a day as needed pain 4)  Lisinopril 20 Mg Tabs (Lisinopril) .Marland Kitchen.. 1 by mouth once daily 5)  Fluticasone Propionate 50 Mcg/act Susp (Fluticasone propionate) .... 2 spray/side once daily for allergies 6)  Simvastatin 40 Mg Tabs (Simvastatin) .Marland Kitchen.. 1 by mouth once daily 7)  Adult Aspirin Ec Low Strength 81 Mg Tbec (Aspirin) .Marland Kitchen.. 1 by mouth once daily 8)  Tums 500 Mg Chew (Calcium carbonate antacid) .... As needed 9)  St Johns Wort 300 Mg Caps ([address] johns wort) .Marland Kitchen.. 1 by mouth three  times a day  Patient Instructions: 1)  Please take all new medications as prescribed  - the new generic prozac (called fluoxetine)  - sent to the pharmacy 2)  re-start the lisinopril at 20 mg per day - sent to the pharmacy 3)  Remember, the generic prozac and the lisinopril should be $4 each at the [facility] 4)  Continue all previous medications as before this visit , as you can 5)  please go to the ER or [facility] for any worsening symptoms such as thoughts or suicide, hallucinations or confusion 6)  Please schedule a follow-up appointment in 1 month with CPX labs Prescriptions: LISINOPRIL 20 MG  TABS (LISINOPRIL) 1 by mouth once daily  #90 x 3   Entered and Authorized by:   Corwin Levins MD   Signed by:   Corwin Levins MD on 07/17/2009   Method used:   Electronically to        [facility]. [phone]* (retail)       [address]       [place], [place]  [address]       Ph: [phone]       Fax: [phone]   RxID:   [id number] FLUOXETINE HCL 20 MG CAPS (FLUOXETINE HCL) 1 by mouth once daily  #90 x 3   Entered and Authorized by:   Corwin Levins MD   Signed by:   Corwin Levins MD on 07/17/2009   Method used:   Electronically to        [facility]. [phone]* (retail)       [address]       [place], [place]   [address]       Ph: [phone]       Fax: [phone]   RxID:   [id number]

## 2010-02-21 NOTE — Assessment & Plan Note (Signed)
Summary: HYPERSOMNIA///kp   Visit Type:  Initial Consult Copy to:  Dr. Oliver Barre Primary Provider/Referring Provider:  Dr. Oliver Barre  CC:  Sleep consult for hypersomnia. Marland Kitchen  History of Present Illness: 50 yo male with hypersomnia.  He has noticed problems with his sleep for several years.  He was told to have sleep evaluation, but did not follow through.  His symptoms have been getting worse, and he finally decided he wanted to do something more.  He feels sleepy all the time.  He goes to bed at 9pm.  He is not using anything to help him sleep.  He falls asleep quickly.  He wakes up after about 2 hours to use the bathroom.  He will sometimes have trouble falling back to sleep.  He gets out of bed at 5am.  He feels tired when he wakes up, and will get headaches in the morning.  He drinks 5 cups of coffee and 4 or 5 sodas per day.  He needs the caffiene to keep going.  He has tried energy drinks, but these don't help.  He does snore, and wakes up with a choking sensation.  He has trouble sleeping on his back.  He will sometimes wake up in the middle of a dream, and has been acting out his dream on occasional.    He denies sleep walking, sleep talking, or bruxism.  There is no history of restless legs.  He denies sleep hallucinations, or cataplexy.  He has experienced a few episodes in which he felt like he couldn't move even though he was awake.  He denies alcohol use.  His recent thyroid function was okay.  He has gained about 30 lbs over the past two years.  He has a history of depression and anxiety.  He has not been taking his medications.  He says that the medicines are too expensive.  He sometimes can hear his neighbors talking about him through the walls of his apartment.  He denies visual hallucinations.  He denies suicidal or homicidal ideation.  Epworth is 9 out of 24.  Preventive Screening-Counseling & Management  Alcohol-Tobacco     Alcohol drinks/day: 0     Smoking Status:  never  Current Medications (verified): 1)  Alprazolam 0.5 Mg  Tabs (Alprazolam) .Marland Kitchen.. 1 By Mouth Two Times A Day As Needed 2)  Sertraline Hcl 100 Mg Tabs (Sertraline Hcl) .... 2 By Mouth Once Daily 3)  Meloxicam 15 Mg Tabs (Meloxicam) .... Take 1 Tablet By Mouth Once A Day As Needed Pain 4)  Lisinopril 20 Mg  Tabs (Lisinopril) .Marland Kitchen.. 1 By Mouth Once Daily 5)  Fluticasone Propionate 50 Mcg/act  Susp (Fluticasone Propionate) .... 2 Spray/side Once Daily For Allergies 6)  Simvastatin 40 Mg  Tabs (Simvastatin) .Marland Kitchen.. 1 By Mouth Once Daily 7)  Adult Aspirin Ec Low Strength 81 Mg Tbec (Aspirin) .Marland Kitchen.. 1 By Mouth Once Daily 8)  Tums 500 Mg Chew (Calcium Carbonate Antacid) .... As Needed 9)  St Johns Wort 300 Mg Caps ([address] Johns Wort) .Marland Kitchen.. 1 By Mouth Three Times A Day  Allergies (verified): 1)  Prozac  Past History:  Past Surgical History: Last updated: 02/16/2007 Denies surgical history  Past Medical History: Reviewed history from 02/16/2007 and no changes required. Depression GERD Hyperlipidemia Anxiety Hypertension Allergic rhinitis  Family History: Reviewed history from 02/16/2007 and no changes required. Family History of Alcoholism/Addiction Family History of Arthritis breast cancer prostate cancer Family History High cholesterol heart disease stroke kidney disease DM  pancreatic cancer - uncle  Social History: Reviewed history from 06/20/2009 and no changes required. Never Smoked Alcohol use-no Single no children Drug use-no Alcohol drinks/day:  0  Review of Systems       The patient complains of shortness of breath with activity, shortness of breath at rest, chest pain, acid heartburn, tooth/dental problems, headaches, nasal congestion/difficulty breathing through nose, sneezing, itching, ear ache, anxiety, depression, hand/feet swelling, and joint stiffness or pain.  The patient denies productive cough, non-productive cough, coughing up blood, irregular heartbeats,  indigestion, loss of appetite, weight change, abdominal pain, difficulty swallowing, sore throat, rash, change in color of mucus, and fever.    Vital Signs:  Patient profile:   50 year old male Height:      70 inches (177.80 cm) Weight:      249 pounds (113.18 kg) BMI:     35.86 O2 Sat:      94 % on Room air Temp:     97.7 degrees F (36.50 degrees C) oral Pulse rate:   89 / minute BP sitting:   120 / 78  (left arm) Cuff size:   large  Vitals Entered By: Michel Bickers CMA (July 05, 2009 2:01 PM)  O2 Sat at Rest %:  94 O2 Flow:  Room air CC: Sleep consult for hypersomnia.  Is Patient Diabetic? No Comments Medications reviewed. Daytime phone verified. Michel Bickers [facility]  July 05, 2009 2:34 PM   Physical Exam  General:  obese.   Eyes:  PERRLA and EOMI.   Nose:  no deformity, discharge, inflammation, or lesions Mouth:  MP 3, no exudate Neck:  no JVD.   Chest Wall:  no deformities noted Lungs:  clear bilaterally to auscultation and percussion Heart:  regular rate and rhythm, S1, S2 without murmurs, rubs, gallops, or clicks Abdomen:  bowel sounds positive; abdomen soft and non-tender without masses, or organomegaly Msk:  no deformity or scoliosis noted with normal posture Pulses:  pulses normal Extremities:  no clubbing, cyanosis, edema, or deformity noted Neurologic:  CN II-XII grossly intact with normal reflexes, coordination, muscle strength and tone Skin:  multiple areas of excoriation Cervical Nodes:  no significant adenopathy Psych:  depressed affect, easily distracted, poor concentration, and poor historian.     Impression & Recommendations:  Problem # 1:  HYPERSOMNIA (ICD-780.54) He has sleep disruption and excessive daytime sleepiness.  He has prior history of hypertension and depression.  I am concerned that he has sleep apnea.  He also reports intermittent episodes of acting out his dreams.  He could also have a component of REM sleep behavior disorder.  To further  assess these I will schedule him for a sleep test.  I explained to him how his weight can affect his sleep.  I advised him to use caution when operating equipment.  Problem # 2:  DEPRESSION (ICD-311) He has been off his medications for at least one year.  He states that he can not afford these medications.  He is to follow up with his primary physician.  He had rather odd behavior patterns.  He would likely benefit from further psychiatric evaluation.  Medications Added to Medication List This Visit: 1)  Tums 500 Mg Chew (Calcium carbonate antacid) .... As needed 2)  [employer] Wort 300 Mg Caps ([address] johns wort) .Marland Kitchen.. 1 by mouth three times a day  Complete Medication List: 1)  Alprazolam 0.5 Mg Tabs (Alprazolam) .Marland Kitchen.. 1 by mouth two times a day as needed 2)  Sertraline Hcl 100 Mg Tabs (Sertraline hcl) .... 2 by mouth once daily 3)  Meloxicam 15 Mg Tabs (Meloxicam) .... Take 1 tablet by mouth once a day as needed pain 4)  Lisinopril 20 Mg Tabs (Lisinopril) .Marland Kitchen.. 1 by mouth once daily 5)  Fluticasone Propionate 50 Mcg/act Susp (Fluticasone propionate) .... 2 spray/side once daily for allergies 6)  Simvastatin 40 Mg Tabs (Simvastatin) .Marland Kitchen.. 1 by mouth once daily 7)  Adult Aspirin Ec Low Strength 81 Mg Tbec (Aspirin) .Marland Kitchen.. 1 by mouth once daily 8)  Tums 500 Mg Chew (Calcium carbonate antacid) .... As needed 9)  St Johns Wort 300 Mg Caps ([address] johns wort) .Marland Kitchen.. 1 by mouth three times a day  Other Orders: Consultation Level IV (56213) Sleep Disorder Referral (Sleep Disorder)  Patient Instructions: 1)  Will schedule sleep apnea 2)  Will call to schedule follow up after sleep test is reviewed

## 2010-03-29 ENCOUNTER — Encounter: Payer: Self-pay | Admitting: Internal Medicine

## 2010-03-29 ENCOUNTER — Other Ambulatory Visit: Payer: Self-pay | Admitting: Internal Medicine

## 2010-03-29 ENCOUNTER — Other Ambulatory Visit: Payer: PRIVATE HEALTH INSURANCE

## 2010-03-29 ENCOUNTER — Ambulatory Visit (INDEPENDENT_AMBULATORY_CARE_PROVIDER_SITE_OTHER): Payer: PRIVATE HEALTH INSURANCE | Admitting: Internal Medicine

## 2010-03-29 DIAGNOSIS — F3289 Other specified depressive episodes: Secondary | ICD-10-CM

## 2010-03-29 DIAGNOSIS — R109 Unspecified abdominal pain: Secondary | ICD-10-CM | POA: Insufficient documentation

## 2010-03-29 DIAGNOSIS — I10 Essential (primary) hypertension: Secondary | ICD-10-CM

## 2010-03-29 DIAGNOSIS — G4733 Obstructive sleep apnea (adult) (pediatric): Secondary | ICD-10-CM

## 2010-03-29 DIAGNOSIS — F329 Major depressive disorder, single episode, unspecified: Secondary | ICD-10-CM

## 2010-03-29 LAB — URINALYSIS, ROUTINE W REFLEX MICROSCOPIC
Bilirubin Urine: NEGATIVE
Hgb urine dipstick: NEGATIVE
Nitrite: NEGATIVE
Total Protein, Urine: NEGATIVE

## 2010-03-29 LAB — CBC WITH DIFFERENTIAL/PLATELET
Eosinophils Absolute: 0.3 10*3/uL (ref 0.0–0.7)
Lymphocytes Relative: 26.3 % (ref 12.0–46.0)
Lymphs Abs: 1.8 10*3/uL (ref 0.7–4.0)
MCHC: 34.5 g/dL (ref 30.0–36.0)
MCV: 87.1 fl (ref 78.0–100.0)
Monocytes Absolute: 0.7 10*3/uL (ref 0.1–1.0)
Monocytes Relative: 10 % (ref 3.0–12.0)
Neutro Abs: 4 10*3/uL (ref 1.4–7.7)
Neutrophils Relative %: 58.9 % (ref 43.0–77.0)
Platelets: 282 10*3/uL (ref 150.0–400.0)
RDW: 14 % (ref 11.5–14.6)
WBC: 6.7 10*3/uL (ref 4.5–10.5)

## 2010-03-29 LAB — LIPASE: Lipase: 25 U/L (ref 11.0–59.0)

## 2010-03-29 LAB — BASIC METABOLIC PANEL
Chloride: 108 mEq/L (ref 96–112)
Glucose, Bld: 99 mg/dL (ref 70–99)
Potassium: 4.1 mEq/L (ref 3.5–5.1)

## 2010-03-29 LAB — HEPATIC FUNCTION PANEL
ALT: 23 U/L (ref 0–53)
AST: 22 U/L (ref 0–37)
Total Protein: 7.2 g/dL (ref 6.0–8.3)

## 2010-03-30 ENCOUNTER — Other Ambulatory Visit: Payer: Self-pay | Admitting: Internal Medicine

## 2010-04-03 NOTE — Assessment & Plan Note (Signed)
Summary: COULDN'T GET REASON OUT OF PT--STC   Vital Signs:  Patient profile:   50 year old male Height:      70 inches Weight:      259.50 pounds BMI:     37.37 O2 Sat:      94 % on Room air Temp:     97.7 degrees F oral Pulse rate:   105 / minute BP sitting:   162 / 90  (left arm) Cuff size:   large  Vitals Entered By: Margaret Pyle, CMA (March 29, 2010 1:29 PM)  O2 Flow:  Room air  CC: RT Abd pain x several months. Pt states pain has been ongoing and has recently increased in intensity.   Primary Care Provider:  Dr. Oliver Barre  CC:  RT Abd pain x several months. Pt states pain has been ongoing and has recently increased in intensity..  History of Present Illness: here to f/u - appears extremely fatigued , admits to noncompliacne to CPAP and all meds , though we took pains last visit to explain his meds were the least expensive , and at his pharmacy at relatively low cost.  Pt denies CP, worsening sob, doe, wheezing, orthopnea, pnd, worsening LE edema, palps, dizziness or syncope  Pt denies new neuro symptoms such as headache, facial or extremity weakness  Pt denies polydipsia, polyuria, or low sugar symptoms such as shakiness improved with eating.  Overall good compliance with meds, trying to follow low chol, DM diet, wt stable, little excercise however  Does have marked fatigue and daytime somnolence, and indeed today is very fatigued appaering.     Also c/o vague abd pain, mostly right sided, with occasional n/v , no radiaiton, fever, wt loss, dyshphagia, but has been intermittently constipated as well.  He is concerned about GB since a friend of his had similar symptoms it seems.  No blood, dysphagia.    Problems Prior to Update: 1)  Abdominal Pain Other Specified Site  (ICD-789.09) 2)  Obstructive Sleep Apnea  (ICD-327.23) 3)  Ruq Pain  (ICD-789.01) 4)  Plantar Fasciitis, Left  (ICD-728.71) 5)  Preventive Health Care  (ICD-V70.0) 6)  Fasciitis, Plantar   (ICD-728.71) 7)  Allergic Rhinitis  (ICD-477.9) 8)  Chest Pain  (ICD-786.50) 9)  Hypersomnia  (ICD-780.54) 10)  Hypertension  (ICD-401.9) 11)  Family History of Alcoholism/addiction  (ICD-V61.41) 12)  Anxiety  (ICD-300.00) 13)  Hyperlipidemia  (ICD-272.4) 14)  Gerd  (ICD-530.81) 15)  Depression  (ICD-311)  Medications Prior to Update: 1)  Fluoxetine Hcl 20 Mg Caps (Fluoxetine Hcl) .Marland Kitchen.. 1 By Mouth Once Daily 2)  Diclofenac Sodium 75 Mg Tbec (Diclofenac Sodium) .Marland Kitchen.. 1po Two Times A Day As Needed 3)  Lisinopril 20 Mg  Tabs (Lisinopril) .Marland Kitchen.. 1 By Mouth Once Daily 4)  Fluticasone Propionate 50 Mcg/act  Susp (Fluticasone Propionate) .... 2 Spray/side Once Daily For Allergies 5)  Pravastatin Sodium 40 Mg Tabs (Pravastatin Sodium) .Marland Kitchen.. 1po Once Daily 6)  Adult Aspirin Ec Low Strength 81 Mg Tbec (Aspirin) .Marland Kitchen.. 1 By Mouth Once Daily 7)  Tums 500 Mg Chew (Calcium Carbonate Antacid) .... As Needed 8)  St Johns Wort 300 Mg Caps ([address] Johns Wort) .Marland Kitchen.. 1 By Mouth Three Times A Day 9)  Fexofenadine Hcl 180 Mg Tabs (Fexofenadine Hcl) .Marland Kitchen.. 1po Once Daily  Current Medications (verified): 1)  Fluoxetine Hcl 20 Mg Caps (Fluoxetine Hcl) .Marland Kitchen.. 1 By Mouth Once Daily 2)  Lisinopril 20 Mg  Tabs (Lisinopril) .Marland Kitchen.. 1 By Mouth Once  Daily 3)  Pravastatin Sodium 40 Mg Tabs (Pravastatin Sodium) .Marland Kitchen.. 1po Once Daily 4)  Adult Aspirin Ec Low Strength 81 Mg Tbec (Aspirin) .Marland Kitchen.. 1 By Mouth Once Daily 5)  Tums 500 Mg Chew (Calcium Carbonate Antacid) .... As Needed  Allergies (verified): 1)  Prozac  Past History:  Past Medical History: Last updated: 09/04/2009 Depression GERD Hyperlipidemia Anxiety Hypertension Allergic rhinitis OSA  Past Surgical History: Last updated: 02/16/2007 Denies surgical history  Social History: Last updated: 06/20/2009 Never Smoked Alcohol use-no Single no children Drug use-no  Risk Factors: Alcohol Use: 0 (07/05/2009)  Risk Factors: Smoking Status: never  (07/05/2009)  Review of Systems       all otherwise negative per pt -    Physical Exam  General:  alert and overweight-appearing.  , appears very fatigued and slow mentation Head:  normocephalic and atraumatic.   Eyes:  vision grossly intact, pupils equal, and pupils round.   Ears:  R ear normal and L ear normal.   Nose:  no external deformity and no nasal discharge.   Mouth:  no gingival abnormalities and pharynx pink and moist.   Neck:  supple and no masses.   Lungs:  normal respiratory effort and normal breath sounds.   Heart:  normal rate and regular rhythm.   Abdomen:  soft and normal bowel sounds.with midl ruq/rlq tender without guarding or rebound Msk:  no joint tenderness and no joint swelling.   Extremities:  no edema, no erythema  Neurologic:  strength normal in all extremities and gait normal.     Impression & Recommendations:  Problem # 1:  ABDOMINAL PAIN OTHER SPECIFIED SITE (ICD-789.09)  The following medications were removed from the medication list:    Diclofenac Sodium 75 Mg Tbec (Diclofenac sodium) .Marland Kitchen... 1po two times a day as needed His updated medication list for this problem includes:    Adult Aspirin Ec Low Strength 81 Mg Tbec (Aspirin) .Marland Kitchen... 1 by mouth once daily right side - ? constipation vs other - does seem to have some abd distnension today and recent n/v;  wil check abd films, routine labs as well as abd u/s to screen for GB (all of which he states he is willing to do today);    Orders: T-Acute Abdomen (2 view w/ PA & Chest (02725DG) Radiology Referral (Radiology) TLB-BMP (Basic Metabolic Panel-BMET) (80048-METABOL) TLB-CBC Platelet - w/Differential (85025-CBCD) TLB-Hepatic/Liver Function Pnl (80076-HEPATIC) TLB-Lipase (83690-LIPASE) TLB-Udip w/ Micro (81001-URINE)  Problem # 2:  HYPERTENSION (ICD-401.9)  His updated medication list for this problem includes:    Lisinopril 20 Mg Tabs (Lisinopril) .Marland Kitchen... 1 by mouth once daily  BP today:  162/90 Prior BP: 148/90 (09/04/2009)  Labs Reviewed: K+: 4.0 (06/20/2009) Creat: : 0.9 (06/20/2009)   Chol: 212 (06/20/2009)   HDL: 36.00 (06/20/2009)   LDL: 107 (05/02/2008)   TG: 416.0 (06/20/2009) noncompliant with tx - will try again as per last visit with same tx recommendation  Problem # 3:  DEPRESSION (ICD-311)  His updated medication list for this problem includes:    Fluoxetine Hcl 20 Mg Caps (Fluoxetine hcl) .Marland Kitchen... 1 by mouth once daily  Discussed treatment options, including trial of antidpressant medication. . Patient agrees to call if any worsening of symptoms or thoughts of doing harm arise. Verified that the patient has no suicidal ideation at this time.   treat as above, f/u any worsening signs or symptoms   Problem # 4:  OBSTRUCTIVE SLEEP APNEA (ICD-327.23) urged to start the CPAP, which may in  the big picture be his biggest problem today, as well as the noncomplaince issues and financial constraints  Complete Medication List: 1)  Fluoxetine Hcl 20 Mg Caps (Fluoxetine hcl) .Marland Kitchen.. 1 by mouth once daily 2)  Lisinopril 20 Mg Tabs (Lisinopril) .Marland Kitchen.. 1 by mouth once daily 3)  Pravastatin Sodium 40 Mg Tabs (Pravastatin sodium) .Marland Kitchen.. 1po once daily 4)  Adult Aspirin Ec Low Strength 81 Mg Tbec (Aspirin) .Marland Kitchen.. 1 by mouth once daily 5)  Tums 500 Mg Chew (Calcium carbonate antacid) .... As needed  Patient Instructions: 1)  please use your CPAP as prescribed by your [facility]ologist 2)  Please take all new medications as prescribed  - the blood pressure, cholesterol and depression medications 3)  Please go to the Lab in the basement for your blood and/or urine tests today 4)  Please go to Radiology in the basement level for your X-Ray today  5)  You will be contacted about the referral(s) to: ultrasound for the abdomen to see about the gallbladder 6)  Please schedule a follow-up appointment in 2 weeks. Prescriptions: PRAVASTATIN SODIUM 40 MG TABS (PRAVASTATIN SODIUM) 1po once  daily  #90 x 3   Entered and Authorized by:   Corwin Levins MD   Signed by:   Corwin Levins MD on 03/29/2010   Method used:   Electronically to        [facility]  [phone]* (retail)       [address]       [place], [place]  [address]       Ph: [phone] or [phone]       Fax: [phone]   RxID:   [id number] LISINOPRIL 20 MG  TABS (LISINOPRIL) 1 by mouth once daily  #90 x 3   Entered and Authorized by:   Corwin Levins MD   Signed by:   Corwin Levins MD on 03/29/2010   Method used:   Electronically to        [facility]  [phone]* (retail)       [address]       [place], [place]  [address]       Ph: [phone] or [phone]       Fax: [phone]   RxID:   [id number] FLUOXETINE HCL 20 MG CAPS (FLUOXETINE HCL) 1 by mouth once daily  #90 x 3   Entered and Authorized by:   Corwin Levins MD   Signed by:   Corwin Levins MD on 03/29/2010   Method used:   Electronically to        [facility]  [phone]* (retail)       [address]       [place], [place]  [address]       Ph: [phone] or [phone]       Fax: [phone]   RxID:   [id number]    Orders Added: 1)  T-Acute Abdomen (2 view w/ PA & Chest [74022TC] 2)  Radiology Referral [Radiology] 3)  TLB-BMP (Basic Metabolic Panel-BMET) [80048-METABOL] 4)  TLB-CBC Platelet - w/Differential [85025-CBCD] 5)  TLB-Hepatic/Liver Function Pnl [80076-HEPATIC] 6)  TLB-Lipase [83690-LIPASE] 7)  TLB-Udip w/ Micro [81001-URINE] 8)  Est. Patient Level IV [25852]

## 2010-04-05 ENCOUNTER — Encounter: Payer: Self-pay | Admitting: Internal Medicine

## 2010-04-23 ENCOUNTER — Inpatient Hospital Stay: Admission: RE | Admit: 2010-04-23 | Payer: PRIVATE HEALTH INSURANCE | Source: Ambulatory Visit

## 2010-04-23 ENCOUNTER — Other Ambulatory Visit: Payer: PRIVATE HEALTH INSURANCE

## 2010-05-01 LAB — POCT CARDIAC MARKERS: Myoglobin, poc: 107 ng/mL (ref 12–200)

## 2010-05-01 LAB — COMPREHENSIVE METABOLIC PANEL
ALT: 17 U/L (ref 0–53)
ALT: 19 U/L (ref 0–53)
AST: 19 U/L (ref 0–37)
AST: 21 U/L (ref 0–37)
Alkaline Phosphatase: 77 U/L (ref 39–117)
CO2: 26 mEq/L (ref 19–32)
CO2: 28 mEq/L (ref 19–32)
Calcium: 8.8 mg/dL (ref 8.4–10.5)
Calcium: 9.2 mg/dL (ref 8.4–10.5)
Chloride: 106 mEq/L (ref 96–112)
GFR calc Af Amer: >60 mL/min (ref >60)
GFR calc Af Amer: >60 mL/min (ref >60)
GFR calc non Af Amer: >60 mL/min (ref >60)
GFR calc non Af Amer: >60 mL/min (ref >60)
Glucose, Bld: 101 mg/dL — ABNORMAL HIGH (ref 70–99)
Potassium: 4.1 mEq/L (ref 3.5–5.1)
Potassium: 4.2 mEq/L (ref 3.5–5.1)
Sodium: 140 mEq/L (ref 135–145)
Sodium: 142 mEq/L (ref 135–145)
Total Bilirubin: 0.6 mg/dL (ref 0.3–1.2)
Total Protein: 6.8 g/dL (ref 6.0–8.3)

## 2010-05-01 LAB — POCT I-STAT, CHEM 8
BUN: 22 mg/dL (ref 6–23)
Creatinine, Ser: 0.9 mg/dL (ref 0.4–1.5)
Glucose, Bld: 111 mg/dL — ABNORMAL HIGH (ref 70–99)
Potassium: 3.6 mEq/L (ref 3.5–5.1)
Sodium: 141 mEq/L (ref 135–145)

## 2010-05-01 LAB — CARDIAC PANEL(CRET KIN+CKTOT+MB+TROPI)
CK, MB: 2.4 ng/mL (ref 0.3–4.0)
CK, MB: 2.7 ng/mL (ref 0.3–4.0)
Relative Index: 1.3 (ref 0.0–2.5)
Total CK: 192 U/L (ref 7–232)

## 2010-05-01 LAB — CBC
Hemoglobin: 13.2 g/dL (ref 13.0–17.0)
MCHC: 34.7 g/dL (ref 30.0–36.0)
MCV: 86.2 fL (ref 78.0–100.0)
Platelets: 280 10*3/uL (ref 150–400)
RBC: 4.57 MIL/uL (ref 4.22–5.81)

## 2010-05-01 LAB — DIFFERENTIAL
Lymphocytes Relative: 32 % (ref 12–46)
Lymphs Abs: 2 10*3/uL (ref 0.7–4.0)
Monocytes Relative: 11 % (ref 3–12)
Neutrophils Relative %: 54 % (ref 43–77)

## 2010-06-05 NOTE — H&P (Signed)
NAME:  Phillip Butler, Phillip Butler NO.:  [id number]   MEDICAL RECORD NO.:  [id number]          PATIENT TYPE:  INP   LOCATION:  2022                         FACILITY:  [facility]   PHYSICIAN:  Joylene John, MD       DATE OF BIRTH:  [birth year]   DATE OF ADMISSION:  06/11/2008  DATE OF DISCHARGE:                              HISTORY & PHYSICAL   REASON FOR ADMISSION:  Chest pain.   HISTORY OF PRESENT ILLNESS:  This is a 50 year old male coming in with  complaints of chest pain.  According to the patient, the pain started  while he was at work.  He has had similar episodes in the past.  According to the patient, the worry and stress triggers the pain.  No  relation with exertion.  Pain according to the patient has located at  entire left side of the chest, it is pressure-like in nature.  He took  aspirin today which helped to relieve the pain.  It is associated with  diaphoresis, nausea, and shortness of breath.   PAST MEDICAL HISTORY:  According to the patient, past medical history of  depression and sleep apnea.   SOCIAL HISTORY:  No alcohol, drugs, or tobacco.   FAMILY HISTORY:  Father had MI at a young age.   ALLERGIES:  No known allergies.   EKG:  Normal sinus rhythm.  No ST-T changes.  No previous EKG to  compare.   REVIEW OF SYSTEMS:  Please refer to the HPI.  Otherwise, 14-point review  of system is negative.   PHYSICAL EXAMINATION:  VITAL SIGNS:  Temperature of 97.4, blood pressure  131/74, pulse of 73, respiration is 20.  GENERAL:  The patient is mentally challenged poor historian.  No acute  distress.  Able to follow simple commands and answer simple questions.  No icterus, pallor, or JVD appreciated.  LUNGS:  Clear to auscultation.  CARDIOVASCULAR:  Regular rate and rhythm.  LOWER EXTREMITY:  No edema appreciated.   CHEST X-RAY:  Without any acute pathology, borderline cardiomegaly lab  show cardiac enzymes first set negative.  Sodium 141, potassium 3.6,  chloride 106, bicarb 24, BUN 22, creatinine 0.9, glucose 111, hemoglobin  13.2, hematocrit 37.1, white count 6.4, platelets 280.   ASSESSMENT AND PLAN:  Plan is to admit the patient to a tele bed and to  do serial cardiac enzymes.  Continue aspirin.  The patient tells me he  does have an outpatient PCP.  Once the patient is ruled out, the patient  can likely be discharged, and have outpatient followup for cardiac  workup.      Joylene John, MD  Electronically Signed     RP/MEDQ  D:  06/11/2008  T:  06/12/2008  Job:  [id number]

## 2010-06-05 NOTE — Discharge Summary (Signed)
NAME:  STEFANO, TRULSON NO.:  [id number]   MEDICAL RECORD NO.:  [id number]          PATIENT TYPE:  INP   LOCATION:  2022                         FACILITY:  [facility]   PHYSICIAN:  Willow Ora, MD           DATE OF BIRTH:  15-Feb-1960   DATE OF ADMISSION:  06/11/2008  DATE OF DISCHARGE:  06/12/2008                               DISCHARGE SUMMARY   ADMITTING DIAGNOSIS:  Chest pain.   DISCHARGE DIAGNOSES:  1. Chest pain with negative cardiac enzymes.  2. Hypertension.  3. History of anxiety and depression.  4. History of hyperlipidemia.  5. Allergic rhinitis.  6. Gastroesophageal reflux disease.   BRIEF HISTORY AND PHYSICAL:  Mr. Cassedy is a 50 year old gentleman who  was admitted with atypical chest pain.  The pain started when he was at  work.  He states that the triggers are usually a stress and there was no  relationship with exertion.  Location is on the entire left side of the  chest and pressure like in nature.  Aspirin seems to relieve the pain.   PHYSICAL EXAMINATION:  VITAL SIGNS:  Upon admission, he was afebrile  with blood pressure 131/74, pulse 73, respirations 20.  LUNGS:  Clear to auscultation bilaterally.  CARDIOVASCULAR:  Regular rate and rhythm without murmur.  LOWER EXTREMITIES:  No edema.  NEUROLOGICAL AND PSYCHIATRIC:  The patient is a poor historian.  He  seems mentally challenged, but he is able to follow all commands and  answer most questions.   LABORATORY AND X-RAYS:  Chest x-ray was unremarkable.  Three sets of  cardiac enzymes are negative.  Calcium was normal.  LFTs were normal.  Potassium 4.1, creatinine 0.8.  hemoglobin 13.2, platelets 280, white  cells 6.4.  EKG was normal.   HOSPITAL COURSE:  The patient was admitted to the telemetry.  He  remained asymptomatic.  Enzymes were negative.  Today on rounds, again  he denies further chest pain.  There was no history of  lower extremity  edema, or pain.  I believe the patient is ready to be  discharge home for  possible further workup as an outpatient by his primary care doctor.  I  am planning to check a D-dimer before he goes home.  The outpatient  chart is reviewed.  He is supposed to be on sertraline to 100 mg 2  tablets a day, meloxicam, lisinopril 20 mg once a day, Flonase,  simvastatin 40 mg daily, and an aspirin every day.  The patient,  however, states that he has only been taking sertraline.  At this point,  he will be discharged on sertraline and lisinopril since his blood  pressure during this admission has been slightly elevated with the  diastolic occasionally in the 90s.  Otherwise, his medication will be  handled by his primary care doctor.   His discharge instructions are as follows:  1. Sertraline as before which is 100 mg two tablets a day.  2. Restart lisinopril 20 mg one a day, prescription for 30 tablets      given to  the patient.  3. He is encouraged to talk with Dr. Kae Heller regards his other      medications.  4. He is advised to see Dr. Aurea Graff next week.  5. If he has severe chest pain, he is instructed to go to the      emergency room.  6. Aspirin 81 mg once a day.   ADDENDUM:  There seems to be some social issues as well.  The patient  has been living with his mother, but he reports some problems there.  The nurse is working on getting the patient's sister to pick him up from  the hospital take him into her home.  I did inform that the social  worker will be involved in this as well.      Willow Ora, MD  Electronically Signed     JP/MEDQ  D:  06/12/2008  T:  06/12/2008  Job:  [id number]

## 2010-08-09 ENCOUNTER — Encounter: Payer: Self-pay | Admitting: Internal Medicine

## 2010-08-10 ENCOUNTER — Encounter: Payer: Self-pay | Admitting: Internal Medicine

## 2010-08-10 DIAGNOSIS — Z0000 Encounter for general adult medical examination without abnormal findings: Secondary | ICD-10-CM | POA: Insufficient documentation

## 2010-08-13 ENCOUNTER — Ambulatory Visit (INDEPENDENT_AMBULATORY_CARE_PROVIDER_SITE_OTHER): Payer: PRIVATE HEALTH INSURANCE | Admitting: Internal Medicine

## 2010-08-13 ENCOUNTER — Encounter: Payer: Self-pay | Admitting: Internal Medicine

## 2010-08-13 ENCOUNTER — Inpatient Hospital Stay (HOSPITAL_COMMUNITY)
Admission: RE | Admit: 2010-08-13 | Discharge: 2010-08-24 | DRG: 885 | Disposition: A | Discharge status: Nursing Home (Includes ICF) | Payer: PRIVATE HEALTH INSURANCE | Attending: Psychiatry | Admitting: Psychiatry

## 2010-08-13 VITALS — BP 140/92 | HR 70 | Temp 98.0°F | Ht 70.0 in | Wt 253.1 lb

## 2010-08-13 DIAGNOSIS — M722 Plantar fascial fibromatosis: Secondary | ICD-10-CM

## 2010-08-13 DIAGNOSIS — F840 Autistic disorder: Secondary | ICD-10-CM

## 2010-08-13 DIAGNOSIS — I10 Essential (primary) hypertension: Secondary | ICD-10-CM

## 2010-08-13 DIAGNOSIS — E785 Hyperlipidemia, unspecified: Secondary | ICD-10-CM

## 2010-08-13 DIAGNOSIS — F3289 Other specified depressive episodes: Secondary | ICD-10-CM

## 2010-08-13 DIAGNOSIS — R45850 Homicidal ideations: Secondary | ICD-10-CM

## 2010-08-13 DIAGNOSIS — H6691 Otitis media, unspecified, right ear: Secondary | ICD-10-CM

## 2010-08-13 DIAGNOSIS — K219 Gastro-esophageal reflux disease without esophagitis: Secondary | ICD-10-CM

## 2010-08-13 DIAGNOSIS — R45851 Suicidal ideations: Secondary | ICD-10-CM

## 2010-08-13 DIAGNOSIS — Z111 Encounter for screening for respiratory tuberculosis: Secondary | ICD-10-CM

## 2010-08-13 DIAGNOSIS — Z7982 Long term (current) use of aspirin: Secondary | ICD-10-CM

## 2010-08-13 DIAGNOSIS — F411 Generalized anxiety disorder: Secondary | ICD-10-CM

## 2010-08-13 DIAGNOSIS — H6690 Otitis media, unspecified, unspecified ear: Secondary | ICD-10-CM

## 2010-08-13 DIAGNOSIS — Z9119 Patient's noncompliance with other medical treatment and regimen: Secondary | ICD-10-CM

## 2010-08-13 DIAGNOSIS — F329 Major depressive disorder, single episode, unspecified: Secondary | ICD-10-CM

## 2010-08-13 DIAGNOSIS — Z91199 Patient's noncompliance with other medical treatment and regimen due to unspecified reason: Secondary | ICD-10-CM

## 2010-08-13 DIAGNOSIS — F322 Major depressive disorder, single episode, severe without psychotic features: Principal | ICD-10-CM

## 2010-08-13 DIAGNOSIS — Z560 Unemployment, unspecified: Secondary | ICD-10-CM

## 2010-08-13 DIAGNOSIS — Z79899 Other long term (current) drug therapy: Secondary | ICD-10-CM

## 2010-08-13 LAB — CBC
HCT: 41.3 % (ref 39.0–52.0)
MCHC: 32.7 g/dL (ref 30.0–36.0)
MCV: 87.7 fL (ref 78.0–100.0)
RDW: 13 % (ref 11.5–15.5)

## 2010-08-13 LAB — DIFFERENTIAL
Eosinophils Relative: 4 % (ref 0–5)
Lymphocytes Relative: 33 % (ref 12–46)
Lymphs Abs: 2.4 10*3/uL (ref 0.7–4.0)
Monocytes Absolute: 0.6 10*3/uL (ref 0.1–1.0)

## 2010-08-13 LAB — COMPREHENSIVE METABOLIC PANEL
Albumin: 4.2 g/dL (ref 3.5–5.2)
BUN: 13 mg/dL (ref 6–23)
Calcium: 9.9 mg/dL (ref 8.4–10.5)
Creatinine, Ser: 0.98 mg/dL (ref 0.50–1.35)
Total Bilirubin: 0.5 mg/dL (ref 0.3–1.2)
Total Protein: 7.8 g/dL (ref 6.0–8.3)

## 2010-08-13 MED ORDER — FLUOXETINE HCL 20 MG PO CAPS: 20.0000 mg | ORAL_CAPSULE | Freq: Every day | ORAL | Status: DC

## 2010-08-13 MED ORDER — AZITHROMYCIN 250 MG PO TABS: ORAL_TABLET | ORAL | Status: AC

## 2010-08-13 MED ORDER — LISINOPRIL 20 MG PO TABS: 20.0000 mg | ORAL_TABLET | Freq: Every day | ORAL | Status: DC

## 2010-08-13 NOTE — Assessment & Plan Note (Signed)
Pt with extremely limited finances; will hold on the statin at this time

## 2010-08-13 NOTE — Assessment & Plan Note (Addendum)
Mild, for tylenol prn ,  to f/u any worsening symptoms or concerns, consider podiatry

## 2010-08-13 NOTE — Patient Instructions (Addendum)
Please re-start the Prozac and Lisinopril (for depression and Blood Pressure) OK to stay off the cholesterol pill for now (pravachol) You can also take Mucinex (or it's generic off brand) for congestion A letter will be sent ASAP to the Dept of Social Services to ask for Case Worker Evaluation;  You may need to have Adult Protective Services involved and assigned a Guardian to help with your Daily activities, Health Care, and Finances You can also use tylenol as needed for the foot pain;  If not better please call for podiatry referral (foot doctor) From here, please go directly to [facility] for Evalaution for Major Depression with Suicidal Ideation Please return in 2 weeks, or sooner if needed

## 2010-08-13 NOTE — Progress Notes (Signed)
Subjective:    Patient ID: Phillip Butler, male    DOB: January 14, 1961, 50 y.o.   MRN: [id number]  HPI  Here to f/u; unfort pt's mother with whom he has been living has been placed in LT facility, and he lost his job, now living alone, noncomplaint with taking medications and essentially Failure to thrive today.  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 Schizophrenia, uncontrollable fits of crying, slowness of thought, cannot pay attention, bad negative thoughts against himself"; also mentions incidentally 2 days onset right earache, low grade temp, dizziness with walking.  We were also contacted per Dept Soc Services this AM prior to pt visit; a concerned person had contacted them about his living situation, unable to care for himself and Dept SS would like letter from [place] to support involving Adult Protective Services Case Worker Evalaution; may need Guardian.  Also with not taking meds including prozac and recent mother placement his depression has been worse, now with daily thoughts of hurting himself, and has plan but denies intent today or soon.  Nonhomicidal.  Pt denies chest pain, increased sob or doe, wheezing, orthopnea, PND, increased LE swelling, palpitations, dizziness or syncope.  Pt denies new neurological symptoms such as new headache, or facial or extremity weakness or numbness   Pt denies polydipsia, polyuria. Does also have some mild left plantar fasciitis type symptoms with pain on standing to the midfoot,. Past Medical History  Diagnosis Date  . Depression   . GERD (gastroesophageal reflux disease)   . Hyperlipidemia   . Anxiety   . Hypertension   . Allergic rhinitis   . OSA (obstructive sleep apnea)    No past surgical history on file.  reports that he has never smoked. He does not have any smokeless tobacco history on file. He reports that he does not drink alcohol or use illicit drugs. family  history includes Alcohol abuse in an unspecified family member; Arthritis in an unspecified family member; Breast cancer in an unspecified family member; Diabetes in an unspecified family member; Heart disease in an unspecified family member; Hyperlipidemia in an unspecified family member; Kidney disease in an unspecified family member; Pancreatic cancer in an unspecified family member; Prostate cancer in an unspecified family member; and Stroke in an unspecified family member. Allergies  Allergen Reactions  . Fluoxetine Hcl     REACTION: ineffective   Current Outpatient Prescriptions on File Prior to Visit  Medication Sig Dispense Refill  . calcium carbonate (TUMS) 500 MG chewable tablet Chew 1 tablet by mouth as needed.        Marland Kitchen aspirin 81 MG EC tablet Take 81 mg by mouth daily.        Marland Kitchen FLUoxetine (PROZAC) 20 MG capsule Take 20 mg by mouth daily.        Marland Kitchen lisinopril (PRINIVIL,ZESTRIL) 20 MG tablet Take 20 mg by mouth daily.        . pravastatin (PRAVACHOL) 40 MG tablet Take 40 mg by mouth daily.         Review of Systems Review of Systems  Constitutional: Negative for diaphoresis and unexpected weight change.  HENT: Negative for drooling and tinnitus.   Eyes: Negative for photophobia and visual disturbance.  Respiratory: Negative for choking and stridor.   Gastrointestinal: Negative for vomiting and blood in stool.  Genitourinary: Negative for hematuria and decreased urine volume.  Musculoskeletal: Negative for gait problem.  Skin:  Negative for color change and wound.      Objective:   Physical Exam BP 140/92  Pulse 70  Temp(Src) 98 F (36.7 C) (Oral)  Ht 5\' 10"  (1.778 m)  Wt 253 lb 2 oz (114.817 kg)  BMI 36.32 kg/m2  SpO2 95% Physical Exam  VS noted Constitutional: Pt appears well-developed and well-nourished.  HENT: Head: Normocephalic.  Right Ear: External ear normal.  Left Ear: External ear normal. Right TM mod erythema, bulging Eyes: Conjunctivae and EOM are normal.  Pupils are equal, round, and reactive to light.  Neck: Normal range of motion. Neck supple.  Cardiovascular: Normal rate and regular rhythm.   Pulmonary/Chest: Effort normal and breath sounds normal.  Abd:  Soft, NT, non-distended, + BS Neurological: Pt is alert. No cranial nerve deficit.  Skin: Skin is warm. No erythema.  Psychiatric: Pt with marked psychomotor retarded, fatigued appearing, depressed affect but alert and x 3   Mild tender left instep, no sweling, erythema, ulcer   Assessment & Plan:

## 2010-08-13 NOTE — Assessment & Plan Note (Signed)
Mild to mod, for antibx course,  to f/u any worsening symptoms or concerns 

## 2010-08-13 NOTE — Assessment & Plan Note (Signed)
Now with major depression episode with suicidal ideation, prob FTT with respect to ability to care for himself at home, and will need to send letter to Dept Soc Serv to ask for Case worker Eval, for adult prot serv purpose, to possibly assign guardian; will re-start the prozac, but more importanntly on leaving here today, he is to go directly to [facility] for intake and suicidal/depression evaluation

## 2010-08-13 NOTE — Assessment & Plan Note (Signed)
Mild elev, to restart the lisinopril he has tolerated well  BP Readings from Last 3 Encounters:  08/13/10 140/92  03/29/10 162/90  09/04/09 148/90

## 2010-08-14 DIAGNOSIS — F840 Autistic disorder: Secondary | ICD-10-CM

## 2010-08-14 DIAGNOSIS — F329 Major depressive disorder, single episode, unspecified: Secondary | ICD-10-CM

## 2010-08-14 LAB — URINALYSIS, ROUTINE W REFLEX MICROSCOPIC
Hgb urine dipstick: NEGATIVE
Protein, ur: NEGATIVE mg/dL
Urobilinogen, UA: 0.2 mg/dL (ref 0.0–1.0)

## 2010-08-14 LAB — TSH: TSH: 2.78 u[IU]/mL (ref 0.350–4.500)

## 2010-08-14 LAB — T3 UPTAKE: T3 Uptake Ratio: 30.6 % (ref 22.5–37.0)

## 2010-08-15 LAB — URINE DRUGS OF ABUSE SCREEN W ALC, ROUTINE (REF LAB)
Amphetamine Screen, Ur: NEGATIVE
Cocaine Metabolites: NEGATIVE
Opiate Screen, Urine: NEGATIVE
Propoxyphene: NEGATIVE

## 2010-08-16 ENCOUNTER — Telehealth: Payer: Self-pay

## 2010-08-16 NOTE — Telephone Encounter (Signed)
Thanks for calling.  I meant to do a letter but have been out of town  Based on recent exam, Phillip Butler is not capable of living alone in that he cannot perform ADL's dependably due to mental and physical illness, and is not capable of handling his finances.

## 2010-08-16 NOTE — Telephone Encounter (Signed)
Mariel Kansky with Social Services called to inquire of MD's opinion of this patients ability to continue to live at home alone. The patients mother has been put in a facility and does MD think the patient has need for placement. Please advise based on last OV of patient's capacity at this time. Call back number is [phone].

## 2010-08-17 NOTE — Telephone Encounter (Signed)
Called Phillip Butler left message to call back 

## 2010-08-19 NOTE — Assessment & Plan Note (Signed)
NAME:  Phillip Butler, RAHIMI NO.:  [id number]  MEDICAL RECORD NO.:  [id number]  LOCATION:  0501                          FACILITY:  [facility]  PHYSICIAN:  Franchot Gallo, MD     DATE OF BIRTH:  03-24-1960  DATE OF ADMISSION:  08/13/2010 DATE OF DISCHARGE:                      PSYCHIATRIC ADMISSION ASSESSMENT   HISTORY OF PRESENT ILLNESS:  This is a 50 year old single white male. He was at a routine appointment today with his primary care doctor, Dr. Oliver Barre at [facility] on [facility], and apparently they had received a telephone call that a concerned person and was asking him to have a DSS evaluation that they felt that he was unable to care for himself.  His mother had recently been put in a long-term facility.  He lost his employment apparently 2 months ago and is reported to have a history of autism.  Apparently, he became noncompliant with his Prozac.  His depression has worsened.  He now has daily thoughts of hurting himself but no plan or intent.  He actually says today that he would never hurt himself.  He said he has had homicidal ideations towards his ex-manager who apparently harassed him into being fired.  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  He is not getting any unemployment.  PAST PSYCHIATRIC HISTORY:  None.  SOCIAL HISTORY:  He has a GED.  He never married.  He has no children. As already stated, he recently lost his job 2 months ago after being employed at [employer] for about 20 years.  He does not drive.  He has to take a bus.  He says his mother is currently in the [facility], and his sister Tyrrell Stephens is available at [phone].  She may be able to help [place] with support materials regarding his autism.  FAMILY HISTORY:  He has 4 sisters.  ALCOHOL AND DRUG HISTORY:  He denies.  His primary care provider is Dr. Oliver Barre.  He states that he has been his patient for 7 years.  MEDICAL PROBLEMS:  He is known to have  hypertension and GERD.  Today he was found to have right otitis media he also has unspecified hyperlipidemia and plantar fascial fibromatosis.  MEDICATIONS:  It was felt that he could stay on Tums 1 tablet by mouth as needed, aspirin 81 mg p.o. daily, enteric-coated.  They were going to restart Prozac, but they started Zoloft here today.  Lisinopril 20 mg p.o. daily, pravastatin 40 mg p.o. daily.  They also stated that he could take Mucinex.  They said a letter will be sent as soon as possible to the Department of Social Services to ask for case worker evaluation.  He may need to have adult protective services involved and be assigned a guardian to help with daily activities, health care and finances.  DRUG ALLERGIES:  No known drug allergies.  POSITIVE PHYSICAL FINDINGS:  He is a well-developed, well-nourished obese white male.  He does have unusual phonation to his voice, and he has an odd speech pattern.  He was medically cleared in Dr. Raphael Gibney office today.  His blood pressure was 140/92, pulse 70.  Temperature  was 98.4.  He weighed 253 pounds.  His BMI is 36.32.  His baseline labs were drawn here.  They are pending.  MENTAL STATUS EXAM:  He is alert and oriented.  He is somewhat unkempt. He needs to wash his clothes, brush his teeth, this sort of thing.  As far as his speech, as already stated, he has unusual phonation and timing on his speech.  He had good eye contact.  His motor was normal. His mood is depressed.  His affect is rather blunted.  His thought processes are superficial, somewhat concrete but he was not noted to be responding to internal stimuli.  He does acknowledge auditory hallucinations.  He says it is not God talking to him.  He would be able to tell that.  He denies that they are command in nature.  Judgment and insight are fair.  Concentration and memory are superficially intact. Intelligence is average to borderline average.  DIAGNOSES:   Axis I: 1.  Adjustment disorder with depression and anxiety. 2. Depressive disorder NOS. Axis II:  Borderline IQ (?) versus autism. Axis III:  Unspecified hyperlipidemia, unspecified the essential hypertension, plantar fascial fibromatosis and right otitis media. Axis IV:  Severe, problems with primary support group, education, occupation, housing and Nurse, children's. Axis V:  GAF 45.  PLAN:  The plan is to admit for safety and stabilization.  He was already started on Zoloft 50 mg p.o. q.a.m.  Our case manager will contact Dr. Fayrene Fearing John's office regarding who they have been in contact with at DSS, and we will also make contact with his sister, Brody Bonneau, at [phone] for help with pinning down his autism.  Estimated length of stay is 3-5 days.     Mickie Leonarda Salon, P.A.-C.   ______________________________ Franchot Gallo, MD    MD/MEDQ  D:  08/13/2010  T:  08/13/2010  Job:  [id number]  Electronically Signed by Jaci Lazier ADAMS P.A.-C. on 08/19/2010 12:22:42 PM Electronically Signed by Franchot Gallo MD on 08/19/2010 07:44:30 PM

## 2010-08-20 NOTE — Telephone Encounter (Signed)
Called Phillip Butler left message to call back 

## 2010-08-20 NOTE — Telephone Encounter (Signed)
Called Mariel Kansky left message to call back

## 2010-08-21 NOTE — Telephone Encounter (Signed)
Called Martha Clark left message to call back 

## 2010-08-21 NOTE — Telephone Encounter (Signed)
Called Phillip Butler left message to call back 

## 2010-08-22 ENCOUNTER — Telehealth: Payer: Self-pay

## 2010-08-22 DIAGNOSIS — F329 Major depressive disorder, single episode, unspecified: Secondary | ICD-10-CM

## 2010-08-22 DIAGNOSIS — F840 Autistic disorder: Secondary | ICD-10-CM

## 2010-08-22 NOTE — Telephone Encounter (Signed)
Phillip Butler with Social Service returned phone call from 08/17/2010. Informed would send a letter per MD pertaining to the patients inability to live alone. Completed letter and will fax to Phillip Butler with Social Services at [phone].

## 2010-08-27 ENCOUNTER — Ambulatory Visit: Payer: PRIVATE HEALTH INSURANCE | Admitting: Internal Medicine

## 2010-08-27 NOTE — Discharge Summary (Signed)
  NAME:  Phillip Butler, Phillip Butler NO.:  [id number]  MEDICAL RECORD NO.:  [id number]  LOCATION:  0500                          FACILITY:  [facility]  PHYSICIAN:  Franchot Gallo, MD     DATE OF BIRTH:  03/21/1960  DATE OF ADMISSION:  08/13/2010 DATE OF DISCHARGE:  08/24/2010                              DISCHARGE SUMMARY   REASON FOR ADMISSION:  This was a 50 year old male that was admitted after there was a DSS evaluation report that the patient was unable to care for himself.  His mother had recently been put in a long-term facility.  He had lost his employment a couple of years ago.  He had a history of autism.  His depression had worsened.  He was having thoughts of hurting himself and having no plan or intent.  He currently was not receiving any services.  FINAL IMPRESSION: 1. Major depressive disorder single episode, severe. 2. Autistic disorder.  SIGNIFICANT FINDINGS:  The patient was admitted to the adult milieu for safety and stabilization.  He had already been initiated on his Zoloft which we continued.  We contacted Dr. Tresa Endo' office regarding who they had been contact with at DSS and made contact with his sister for further insight.  The patient was reporting good sleep and good appetite.  Having severe depressive symptoms rating it a 9, but having no suicidal thoughts.  He was having homicidal thoughts of hurting his manager, but no plan or intent.  He reports moderate anxiety rating it a 5 on a scale of 1-10.  We contacted the patient's sister, Ander Slade, to assess the situation and for [place] to assess any safety concerns.  The patient was very timid and quiet throughout his stay, and very cooperative.  We were focusing on obtaining placement.  He was continuing to feel a little depressed and was hopeful that we will find a place for him to stay.  He was tolerating his medications without any side effects.  On day of discharge, the patient was seen by the  interdisciplinary team to address any other concerns.  The patient was appropriate and denied any suicidal thoughts.  He was reporting good sleep and good appetite.  Having mild depressive symptoms, rating his depression 1-2 on a scale of 1-10. Again denied any suicidal or homicidal thoughts, auditory hallucinations or delusional thinking.  Having mild anxiety, rating it 1-2 on a scale of 1-10.  DISPOSITION:  He was discharged to an assisted-living facility at [facility] [place]  DISCHARGE MEDICATIONS: 1. Aspirin 81 mg daily. 2. Lisinopril 20 mg one daily. 3. Zoloft 100 mg daily. 4. Trazodone 50 mg one nightly for sleep.     Landry Corporal, N.P.   ______________________________ Franchot Gallo, MD    JO/MEDQ  D:  08/24/2010  T:  08/24/2010  Job:  [id number]  Electronically Signed by Limmie PatriciaP. on 08/27/2010 09:46:53 AM Electronically Signed by Franchot Gallo MD on 08/27/2010 02:52:59 PM

## 2010-10-01 ENCOUNTER — Other Ambulatory Visit: Payer: Self-pay

## 2010-10-01 DIAGNOSIS — I10 Essential (primary) hypertension: Secondary | ICD-10-CM

## 2010-10-01 MED ORDER — LISINOPRIL 20 MG PO TABS: 20.0000 mg | ORAL_TABLET | Freq: Every day | ORAL | Status: DC

## 2010-10-01 MED ORDER — ASPIRIN 81 MG PO TBEC: 81.0000 mg | DELAYED_RELEASE_TABLET | Freq: Every day | ORAL | Status: DC

## 2010-10-01 NOTE — Telephone Encounter (Signed)
[facility] called Francena Hanly contact person) to request refill on patients Lisinopril and Aspirin, pharmacy is Licensed conveyancer.

## 2010-11-28 ENCOUNTER — Ambulatory Visit: Payer: PRIVATE HEALTH INSURANCE

## 2010-11-28 ENCOUNTER — Encounter: Payer: Self-pay | Admitting: Internal Medicine

## 2010-11-28 ENCOUNTER — Other Ambulatory Visit: Payer: Self-pay | Admitting: Internal Medicine

## 2010-11-28 ENCOUNTER — Ambulatory Visit (INDEPENDENT_AMBULATORY_CARE_PROVIDER_SITE_OTHER): Payer: PRIVATE HEALTH INSURANCE | Admitting: Internal Medicine

## 2010-11-28 ENCOUNTER — Other Ambulatory Visit (INDEPENDENT_AMBULATORY_CARE_PROVIDER_SITE_OTHER): Payer: PRIVATE HEALTH INSURANCE

## 2010-11-28 VITALS — BP 134/84 | HR 108 | Temp 97.6°F | Ht 70.0 in | Wt 236.5 lb

## 2010-11-28 DIAGNOSIS — Z0000 Encounter for general adult medical examination without abnormal findings: Secondary | ICD-10-CM | POA: Insufficient documentation

## 2010-11-28 DIAGNOSIS — I10 Essential (primary) hypertension: Secondary | ICD-10-CM

## 2010-11-28 DIAGNOSIS — E785 Hyperlipidemia, unspecified: Secondary | ICD-10-CM

## 2010-11-28 DIAGNOSIS — K219 Gastro-esophageal reflux disease without esophagitis: Secondary | ICD-10-CM

## 2010-11-28 DIAGNOSIS — Z0001 Encounter for general adult medical examination with abnormal findings: Secondary | ICD-10-CM | POA: Insufficient documentation

## 2010-11-28 LAB — LIPID PANEL
HDL: 41.3 mg/dL (ref >39.00)
Triglycerides: 285 mg/dL — ABNORMAL HIGH (ref 0.0–149.0)
VLDL: 57 mg/dL — ABNORMAL HIGH (ref 0.0–40.0)

## 2010-11-28 MED ORDER — ATORVASTATIN CALCIUM 10 MG PO TABS: 10.0000 mg | ORAL_TABLET | Freq: Every day | ORAL | Status: DC

## 2010-11-28 MED ORDER — LISINOPRIL 20 MG PO TABS: 20.0000 mg | ORAL_TABLET | Freq: Every day | ORAL | Status: DC

## 2010-11-28 NOTE — Patient Instructions (Addendum)
Continue all other medications as before Please go to LAB in the Basement for the blood and/or urine tests to be done today Please call the phone number [phone] (the PhoneTree System) for results of testing in 2-3 days;  When calling, simply dial the number, and when prompted enter the MRN number above (the Medical Record Number) and the # key, then the message should start. Your forms were filled out today, and the hardcopy refill done for the blood pressure medicine Please do not put table salt on your food, to help keep the blood pressure lower You can also take TUMS as needed up to once per day for heartburn if needed Please return in 6 mo with Lab testing done 3-5 days before

## 2010-11-28 NOTE — Progress Notes (Signed)
Subjective:    Patient ID: Phillip Butler, male    DOB: 1960-06-06, 50 y.o.   MRN: [id number]  HPI  Here to f/u;  To re-cap - pt mother died approx 08-08-12and he suffered major depression and FTT, was hospd at [facility], since then living in a [facility] (and here with [name], owner-admin of the facility) with 5 other male cohabitants - currently located in [place], and for now is long term intent;  meds adjusted per psychiatry while hospd and cont's with f/u with [facility] in [place];  [name] states he has been overall doing well and no significnat compalints, goes to church on sundays with new friends there;   Pt denies chest pain, increased sob or doe, wheezing, orthopnea, PND, increased LE swelling, palpitations, dizziness or syncope.  Pt denies new neurological symptoms such as new headache, or facial or extremity weakness or numbness   Pt denies polydipsia, polyuria, or low sugar symptoms such as weakness or confusion improved with po intake.  Pt states overall good compliance with meds, trying to follow lower cholesterol  diet, wt overall stable but little exercise however.  Statin stopped last visit  - for labs today.  Has had mild worsening reflux, but no dysphagia, abd pain, n/v, bowel change or blood.  [name] fax:  [phone]  Past Medical History  Diagnosis Date  . Depression   . GERD (gastroesophageal reflux disease)   . Hyperlipidemia   . Anxiety   . Hypertension   . Allergic rhinitis   . OSA (obstructive sleep apnea)    No past surgical history on file.  reports that he has never smoked. He does not have any smokeless tobacco history on file. He reports that he does not drink alcohol or use illicit drugs. family history includes Alcohol abuse in an unspecified family member; Arthritis in an unspecified family member; Breast cancer in an unspecified family member; Diabetes in an unspecified family member; Heart disease in an unspecified family  member; Hyperlipidemia in an unspecified family member; Kidney disease in an unspecified family member; Pancreatic cancer in an unspecified family member; Prostate cancer in an unspecified family member; and Stroke in an unspecified family member. Allergies  Allergen Reactions  . Fluoxetine Hcl     REACTION: ineffective   Current Outpatient Prescriptions on File Prior to Visit  Medication Sig Dispense Refill  . aspirin 81 MG EC tablet Take 1 tablet (81 mg total) by mouth daily.  30 tablet  11  . calcium carbonate (TUMS) 500 MG chewable tablet Chew 1 tablet by mouth as needed.         Review of Systems Review of Systems  Constitutional: Negative for diaphoresis and unexpected weight change.  HENT: Negative for drooling and tinnitus.   Eyes: Negative for photophobia and visual disturbance.  Respiratory: Negative for choking and stridor.   Gastrointestinal: Negative for vomiting and blood in stool.  Genitourinary: Negative for hematuria and decreased urine volume.  Musculoskeletal: Negative for gait problem.      Objective:   Physical Exam BP 134/84  Pulse 108  Temp(Src) 97.6 F (36.4 C) (Oral)  Ht 5\' 10"  (1.778 m)  Wt 236 lb 8 oz (107.276 kg)  BMI 33.93 kg/m2  SpO2 95% Physical Exam  VS noted Constitutional: Pt appears well-developed and well-nourished.  HENT: Head: Normocephalic.  Right Ear: External ear normal.  Left Ear: External ear normal.  Eyes: Conjunctivae and EOM are normal. Pupils are equal, round, and  reactive to light.  Neck: Normal range of motion. Neck supple.  Cardiovascular: Normal rate and regular rhythm.   Pulmonary/Chest: Effort normal and breath sounds normal.  Abd:  Soft, NT, non-distended, + BS Neurological: Pt is alert. No cranial nerve deficit.  Skin: Skin is warm. No erythema.  Psychiatric: Pt behavior is normal. Thought content normal.     Assessment & Plan:

## 2010-12-02 ENCOUNTER — Encounter: Payer: Self-pay | Admitting: Internal Medicine

## 2010-12-02 NOTE — Assessment & Plan Note (Signed)
stable overall by hx and exam, most recent data reviewed with pt, and pt to continue medical treatment as before  Lab Results  Component Value Date   WBC 7.1 08/13/2010   HGB 13.5 08/13/2010   HCT 41.3 08/13/2010   PLT 283 08/13/2010   GLUCOSE 95 08/13/2010   CHOL 210* 11/28/2010   TRIG 285.0* 11/28/2010   HDL 41.30 11/28/2010   LDLDIRECT 137.9 11/28/2010   LDLCALC 107* 05/02/2008   ALT 19 08/13/2010   AST 20 08/13/2010   NA 139 08/13/2010   K 3.8 08/13/2010   CL 102 08/13/2010   CREATININE 0.98 08/13/2010   BUN 13 08/13/2010   CO2 29 08/13/2010   TSH 2.780 08/13/2010   PSA 0.32 06/20/2009   For tums prn, declines PPI

## 2010-12-02 NOTE — Assessment & Plan Note (Addendum)
stable overall by hx and exam, most recent data reviewed with pt, and pt to continue medical treatment as before, for statin for ldl > 100  Lab Results  Component Value Date   LDLCALC 107* 05/02/2008

## 2010-12-02 NOTE — Assessment & Plan Note (Signed)
stable overall by hx and exam, most recent data reviewed with pt, and pt to continue medical treatment as before  BP Readings from Last 3 Encounters:  11/28/10 134/84  08/13/10 140/92  03/29/10 162/90

## 2011-04-02 ENCOUNTER — Encounter: Payer: Self-pay | Admitting: Internal Medicine

## 2011-04-02 ENCOUNTER — Ambulatory Visit (INDEPENDENT_AMBULATORY_CARE_PROVIDER_SITE_OTHER)
Admission: RE | Admit: 2011-04-02 | Discharge: 2011-04-02 | Disposition: A | Discharge status: Home / Self Care | Payer: Medicaid Other | Source: Ambulatory Visit | Attending: Internal Medicine | Admitting: Internal Medicine

## 2011-04-02 ENCOUNTER — Ambulatory Visit (INDEPENDENT_AMBULATORY_CARE_PROVIDER_SITE_OTHER): Payer: Medicaid Other | Admitting: Internal Medicine

## 2011-04-02 DIAGNOSIS — R079 Chest pain, unspecified: Secondary | ICD-10-CM | POA: Insufficient documentation

## 2011-04-02 DIAGNOSIS — E785 Hyperlipidemia, unspecified: Secondary | ICD-10-CM

## 2011-04-02 DIAGNOSIS — K219 Gastro-esophageal reflux disease without esophagitis: Secondary | ICD-10-CM

## 2011-04-02 DIAGNOSIS — I10 Essential (primary) hypertension: Secondary | ICD-10-CM

## 2011-04-02 MED ORDER — ATORVASTATIN CALCIUM 10 MG PO TABS: 10.0000 mg | ORAL_TABLET | Freq: Every day | ORAL | Status: DC

## 2011-04-02 MED ORDER — OMEPRAZOLE 20 MG PO CPDR: 20.0000 mg | DELAYED_RELEASE_CAPSULE | Freq: Every day | ORAL | Status: DC

## 2011-04-02 NOTE — Assessment & Plan Note (Signed)
Mild to mod, for prilosec asd,  to f/u any worsening symptoms or concerns

## 2011-04-02 NOTE — Patient Instructions (Addendum)
Your FL2 form was filled out and signed today Your EKG was OK today Start the lipitor 10 mg per day for cholesterol Start the Prilosec 20 mg per day for reflux Continue all other medications as before Please have the pharmacy call if you need other refills We will plan on blood work next visit, but Please go to XRAY in the Basement for the x-ray test Please call the phone number [phone] (the PhoneTree System) for results of testing in 2-3 days;  When calling, simply dial the number, and when prompted enter the MRN number above (the Medical Record Number) and the # key, then the message should start. You will be contacted regarding the referral for: stress test

## 2011-04-02 NOTE — Assessment & Plan Note (Addendum)
Due to communication issue,lipitor not yet started - to start lipitor, f/u OV in 2 mo for repeat labs  Lab Results  Component Value Date   LDLCALC 107* 05/02/2008

## 2011-04-02 NOTE — Assessment & Plan Note (Signed)
stable overall by hx and exam, most recent data reviewed with pt, and pt to continue medical treatment as before  BP Readings from Last 3 Encounters:  04/02/11 132/70  11/28/10 134/84  08/13/10 140/92

## 2011-04-02 NOTE — Assessment & Plan Note (Addendum)
ECG reviewed as per emr, atypical pain ? GI but cant r/o cardiac - for stress test

## 2011-04-02 NOTE — Progress Notes (Signed)
Subjective:    Patient ID: Phillip Butler, male    DOB: 1960-03-16, 51 y.o.   MRN: [id number]  HPI  Here to f/u with caretaker of the group home, who asks for FL2 form to be signed as she is helping him now apply for disability and medicare, as she believes he likely qualifies.  Today incidentally c/o 2-3 day onset mild CP, dull and sharp, intermittent, no radiation, no sob, n/v, palps, diaphoresis, dizziness or syncope.  Does have also occas reflux, sour brash.  Pt denies new neurological symptoms such as new headache, or facial or extremity weakness or numbness   Pt denies polydipsia, polyuria.  Denies worsening depressive symptoms, suicidal ideation, or panic, though has ongoing anxiety, not increased recently.   Overall good compliance with treatment, and good medicine tolerability. Past Medical History  Diagnosis Date  . Depression   . GERD (gastroesophageal reflux disease)   . Hyperlipidemia   . Anxiety   . Hypertension   . Allergic rhinitis   . OSA (obstructive sleep apnea)    No past surgical history on file.  reports that he has never smoked. He does not have any smokeless tobacco history on file. He reports that he does not drink alcohol or use illicit drugs. family history includes Alcohol abuse in an unspecified family member; Arthritis in an unspecified family member; Breast cancer in an unspecified family member; Diabetes in an unspecified family member; Heart disease in an unspecified family member; Hyperlipidemia in an unspecified family member; Kidney disease in an unspecified family member; Pancreatic cancer in an unspecified family member; Prostate cancer in an unspecified family member; and Stroke in an unspecified family member. Allergies  Allergen Reactions  . Fluoxetine Hcl     REACTION: ineffective   Current Outpatient Prescriptions on File Prior to Visit  Medication Sig Dispense Refill  . aspirin 81 MG EC tablet Take 1 tablet (81 mg total) by mouth daily.  30  tablet  11  . calcium carbonate (TUMS) 500 MG chewable tablet Chew 1 tablet by mouth as needed.        Marland Kitchen lisinopril (PRINIVIL,ZESTRIL) 20 MG tablet Take 1 tablet (20 mg total) by mouth daily.  90 tablet  3  . sertraline (ZOLOFT) 100 MG tablet Take 100 mg by mouth daily.        . traZODone (DESYREL) 50 MG tablet Take 50 mg by mouth at bedtime.         Review of Systems Review of Systems  Constitutional: Negative for diaphoresis and unexpected weight change.  HENT: Negative for drooling and tinnitus.   Eyes: Negative for photophobia and visual disturbance.  Respiratory: Negative for choking and stridor.   Gastrointestinal: Negative for vomiting and blood in stool.  Genitourinary: Negative for hematuria and decreased urine volume.     Objective:   Physical Exam BP 132/70  Pulse 106  Temp(Src) 97.3 F (36.3 C) (Oral)  Ht 5\' 8"  (1.727 m)  Wt 225 lb 6 oz (102.229 kg)  BMI 34.27 kg/m2  SpO2 95% Physical Exam  VS noted Constitutional: Pt appears well-developed and well-nourished.  HENT: Head: Normocephalic.  Right Ear: External ear normal.  Left Ear: External ear normal.  Eyes: Conjunctivae and EOM are normal. Pupils are equal, round, and reactive to light.  Neck: Normal range of motion. Neck supple.  Cardiovascular: Normal rate and regular rhythm.   Pulmonary/Chest: Effort normal and breath sounds normal.  Abd:  Soft, NT, non-distended, + BS Neurological: Pt is alert. No  cranial nerve deficit.  Skin: Skin is warm. No erythema.  Psychiatric: Pt behavior is normal. Not depressed affect, 1+ nervous Assessment & Plan:

## 2011-04-15 ENCOUNTER — Encounter (HOSPITAL_COMMUNITY): Payer: Medicaid Other

## 2011-04-16 ENCOUNTER — Encounter (HOSPITAL_COMMUNITY): Payer: Medicaid Other

## 2011-05-06 ENCOUNTER — Ambulatory Visit (HOSPITAL_COMMUNITY): Payer: Medicaid Other | Attending: Internal Medicine | Admitting: Radiology

## 2011-05-06 DIAGNOSIS — R5383 Other fatigue: Secondary | ICD-10-CM | POA: Insufficient documentation

## 2011-05-06 DIAGNOSIS — R079 Chest pain, unspecified: Secondary | ICD-10-CM | POA: Insufficient documentation

## 2011-05-06 DIAGNOSIS — R5381 Other malaise: Secondary | ICD-10-CM | POA: Insufficient documentation

## 2011-05-06 DIAGNOSIS — R55 Syncope and collapse: Secondary | ICD-10-CM | POA: Insufficient documentation

## 2011-05-06 DIAGNOSIS — R0989 Other specified symptoms and signs involving the circulatory and respiratory systems: Secondary | ICD-10-CM | POA: Insufficient documentation

## 2011-05-06 DIAGNOSIS — E785 Hyperlipidemia, unspecified: Secondary | ICD-10-CM | POA: Insufficient documentation

## 2011-05-06 DIAGNOSIS — R0609 Other forms of dyspnea: Secondary | ICD-10-CM | POA: Insufficient documentation

## 2011-05-06 DIAGNOSIS — I10 Essential (primary) hypertension: Secondary | ICD-10-CM | POA: Insufficient documentation

## 2011-05-06 DIAGNOSIS — R0602 Shortness of breath: Secondary | ICD-10-CM | POA: Insufficient documentation

## 2011-05-06 DIAGNOSIS — Z8249 Family history of ischemic heart disease and other diseases of the circulatory system: Secondary | ICD-10-CM | POA: Insufficient documentation

## 2011-05-06 DIAGNOSIS — R42 Dizziness and giddiness: Secondary | ICD-10-CM | POA: Insufficient documentation

## 2011-05-06 DIAGNOSIS — R002 Palpitations: Secondary | ICD-10-CM | POA: Insufficient documentation

## 2011-05-06 MED ORDER — REGADENOSON 0.4 MG/5ML IV SOLN
0.4000 mg | Freq: Once | INTRAVENOUS | Status: AC
  Administered 2011-05-06: 0.4 mg via INTRAVENOUS

## 2011-05-06 MED ORDER — TECHNETIUM TC 99M TETROFOSMIN IV KIT
33.0000 | PACK | Freq: Once | INTRAVENOUS | Status: AC | PRN
  Administered 2011-05-06: 33 via INTRAVENOUS

## 2011-05-06 MED ORDER — TECHNETIUM TC 99M TETROFOSMIN IV KIT
11.0000 | PACK | Freq: Once | INTRAVENOUS | Status: AC | PRN
  Administered 2011-05-06: 11 via INTRAVENOUS

## 2011-05-06 NOTE — Progress Notes (Signed)
[facility] SITE 3 NUCLEAR MED [address] [place] [address] [phone]  Cardiology Nuclear Med Study  Phillip Butler is a 51 y.o. male     MRN : [id number]     DOB: 05/01/1960  Procedure Date: 05/06/2011  Nuclear Med Background Indication for Stress Test:  Evaluation for Ischemia History:  No prior cardiac history Cardiac Risk Factors: Family History - CAD, Hypertension and Lipids  Symptoms:  Chest Pain at rest and  with Exertion (last date of chest discomfort 2 weeks ago), Dizziness, DOE, Fatigue, Fatigue with Exertion, Light-Headedness, Near Syncope, Rapid HR and SOB   Nuclear Pre-Procedure Caffeine/Decaff Intake:  None NPO After: 9:00pm   Lungs:  clear IV 0.9% NS with Angio Cath:  20g  IV Site: R Antecubital  IV Started by:  Stanton Kidney, EMT-P  Chest Size (in):  46 Cup Size: n/a  Height: 5\' 8"  (1.727 m)  Weight:  224 lb (101.606 kg)  BMI:  Body mass index is 34.06 kg/(m^2). Tech Comments:  NA    Nuclear Med Study 1 or 2 day study: 1 day  Stress Test Type:  Treadmill/Lexiscan  Reading MD: Dietrich Pates, MD  Order Authorizing Provider:  Bayard Males  Resting Radionuclide: Technetium 55m Tetrofosmin  Resting Radionuclide Dose: 11.0 mCi   Stress Radionuclide:  Technetium 42m Tetrofosmin  Stress Radionuclide Dose: 33.0 mCi           Stress Protocol Rest HR: 61 Stress HR: 153  Rest BP: 141/91 Stress BP: 151/97  Exercise Time (min): 6:00 METS: 5.5   Predicted Max HR: 169 bpm % Max HR: 90.53 bpm Rate Pressure Product: 21308   Dose of Adenosine (mg):  n/a Dose of Lexiscan: 0.4 mg  Dose of Atropine (mg): n/a Dose of Dobutamine: n/a mcg/kg/min (at max HR)  Stress Test Technologist: Cathlyn Parsons, RN  Nuclear Technologist:  Domenic Polite, CNMT     Rest Procedure:  Myocardial perfusion imaging was performed at rest 45 minutes following the intravenous administration of Technetium 64m Tetrofosmin. Rest ECG: NSR - Normal EKG  Stress  Procedure:  The patient attempted Bruce Protocol but after 4:00 minutes patient fatigued and unable to continue. The patient received IV Lexiscan 0.4 mg over 15-seconds with concurrent low level exercise and then Technetium 35m Tetrofosmin was injected at 30-seconds while the patient continued walking one more minute. There were no significant changes with Lexiscan. Quantitative spect images were obtained after a 45-minute delay. Stress ECG: No significant change from baseline ECG  QPS Raw Data Images:  Images were motion corrected.  Soft tissue (diaphragm) underlies heart. Stress Images:  Normal homogeneous uptake in all areas of the myocardium. Rest Images:  Normal homogeneous uptake in all areas of the myocardium. Subtraction (SDS):  Normal Transient Ischemic Dilatation (Normal <1.22):  0.90 Lung/Heart Ratio (Normal <0.45):  0.30  Quantitative Gated Spect Images QGS EDV:  130 ml QGS ESV:  54 ml  Impression Exercise Capacity:  Patient unable to proceed further than target HR  Switched to lexiscan. BP Response:  Normal blood pressure response. Clinical Symptoms:  No significant symptoms noted. ECG Impression:  No significant ST segment change suggestive of ischemia. Comparison with Prior Nuclear Study: No images to compare  Overall Impression:  Normal stress nuclear study.  LV Ejection Fraction: 59%.  LV Wall Motion:  NL LV Function; NL Wall Motion Dietrich Pates

## 2011-05-07 ENCOUNTER — Encounter: Payer: Self-pay | Admitting: Internal Medicine

## 2011-05-22 ENCOUNTER — Encounter: Payer: Self-pay | Admitting: Internal Medicine

## 2011-05-29 ENCOUNTER — Ambulatory Visit: Payer: PRIVATE HEALTH INSURANCE | Admitting: Internal Medicine

## 2011-05-29 ENCOUNTER — Other Ambulatory Visit (INDEPENDENT_AMBULATORY_CARE_PROVIDER_SITE_OTHER): Payer: Medicaid Other

## 2011-05-29 ENCOUNTER — Encounter: Payer: Self-pay | Admitting: Internal Medicine

## 2011-05-29 ENCOUNTER — Ambulatory Visit (INDEPENDENT_AMBULATORY_CARE_PROVIDER_SITE_OTHER): Payer: Medicaid Other | Admitting: Internal Medicine

## 2011-05-29 VITALS — BP 106/62 | HR 79 | Temp 97.4°F | Ht 69.0 in | Wt 220.4 lb

## 2011-05-29 DIAGNOSIS — N320 Bladder-neck obstruction: Secondary | ICD-10-CM

## 2011-05-29 DIAGNOSIS — I10 Essential (primary) hypertension: Secondary | ICD-10-CM

## 2011-05-29 DIAGNOSIS — Z0000 Encounter for general adult medical examination without abnormal findings: Secondary | ICD-10-CM

## 2011-05-29 DIAGNOSIS — E785 Hyperlipidemia, unspecified: Secondary | ICD-10-CM

## 2011-05-29 DIAGNOSIS — R079 Chest pain, unspecified: Secondary | ICD-10-CM

## 2011-05-29 HISTORY — DX: Bladder-neck obstruction: N32.0

## 2011-05-29 LAB — LIPID PANEL
Cholesterol: 119 mg/dL (ref 0–200)
LDL Cholesterol: 44 mg/dL (ref 0–99)

## 2011-05-29 LAB — URINALYSIS, ROUTINE W REFLEX MICROSCOPIC
Ketones, ur: NEGATIVE
Leukocytes, UA: NEGATIVE
Nitrite: NEGATIVE
Specific Gravity, Urine: <=1.005 (ref 1.000–1.030)
Urobilinogen, UA: 0.2 (ref 0.0–1.0)

## 2011-05-29 LAB — BASIC METABOLIC PANEL
BUN: 14 mg/dL (ref 6–23)
Chloride: 103 mEq/L (ref 96–112)
GFR: 88.71 mL/min (ref >60.00)
Potassium: 3.9 mEq/L (ref 3.5–5.1)
Sodium: 138 mEq/L (ref 135–145)

## 2011-05-29 LAB — CBC WITH DIFFERENTIAL/PLATELET
Eosinophils Absolute: 0.2 10*3/uL (ref 0.0–0.7)
Eosinophils Relative: 3.4 % (ref 0.0–5.0)
HCT: 38.4 % — ABNORMAL LOW (ref 39.0–52.0)
Lymphs Abs: 1.6 10*3/uL (ref 0.7–4.0)
MCHC: 33.9 g/dL (ref 30.0–36.0)
MCV: 86.7 fl (ref 78.0–100.0)
Monocytes Absolute: 0.4 10*3/uL (ref 0.1–1.0)
Platelets: 229 10*3/uL (ref 150.0–400.0)
WBC: 5.6 10*3/uL (ref 4.5–10.5)

## 2011-05-29 LAB — HEPATIC FUNCTION PANEL
AST: 18 U/L (ref 0–37)
Alkaline Phosphatase: 73 U/L (ref 39–117)
Bilirubin, Direct: 0.1 mg/dL (ref 0.0–0.3)
Total Bilirubin: 0.7 mg/dL (ref 0.3–1.2)

## 2011-05-29 LAB — TSH: TSH: 0.81 u[IU]/mL (ref 0.35–5.50)

## 2011-05-29 NOTE — Assessment & Plan Note (Signed)
Not charged , but due for colonoscopy

## 2011-05-29 NOTE — Patient Instructions (Signed)
Continue all other medications as before Please have the pharmacy call with any refills you may need. Please go to LAB in the Basement for the blood and/or urine tests to be done today You will be contacted by phone if any changes need to be made immediately.  Otherwise, you will receive a letter about your results with an explanation. You will be contacted regarding the referral for: colonoscopy You are otherwise up to date with prevention Please continue your efforts at being more active, low cholesterol diet, and weight control.

## 2011-05-30 ENCOUNTER — Encounter: Payer: Self-pay | Admitting: Internal Medicine

## 2011-05-30 LAB — HEMOGLOBIN A1C: Hgb A1c MFr Bld: 5.6 % (ref 4.6–6.5)

## 2011-06-08 ENCOUNTER — Encounter: Payer: Self-pay | Admitting: Internal Medicine

## 2011-06-08 NOTE — Assessment & Plan Note (Signed)
stable overall by hx and exam, most recent data reviewed with pt, and pt to continue medical treatment as before Lab Results  Component Value Date   LDLCALC 44 05/29/2011

## 2011-06-08 NOTE — Assessment & Plan Note (Signed)
Also for psa as he is due 

## 2011-06-08 NOTE — Assessment & Plan Note (Signed)
stable overall by hx and exam, most recent data reviewed with pt, and pt to continue medical treatment as before BP Readings from Last 3 Encounters:  05/29/11 106/62  05/06/11 141/91  04/02/11 132/70

## 2011-06-08 NOTE — Assessment & Plan Note (Signed)
Noncardiac, to cont PPI,,  to f/u any worsening symptoms or concerns

## 2011-06-08 NOTE — Progress Notes (Signed)
Subjective:    Patient ID: Phillip Butler, male    DOB: [birth year], 51 y.o.   MRN: [id number]  HPI  Here to f/u recent eval with stress test  - neg for ischemia.  Has occas  worsening reflux, but no dysphagia, abd pain, n/v, bowel change or blood.  Pt denies increased sob or doe, wheezing, orthopnea, PND, increased LE swelling, palpitations, dizziness or syncope.   Pt denies polydipsia, polyuria,   Pt states overall good compliance with meds, trying to follow lower cholesterol diet, wt overall stable but little exercise however.  Pt denies new neurological symptoms such as new headache, or facial or extremity weakness or numbness  Denies worsening depressive symptoms, suicidal ideation, or panic   Pt denies fever, wt loss, night sweats, loss of appetite, or other constitutional symptoms Past Medical History  Diagnosis Date  . Depression   . GERD (gastroesophageal reflux disease)   . Hyperlipidemia   . Anxiety   . Hypertension   . Allergic rhinitis   . OSA (obstructive sleep apnea)    No past surgical history on file.  reports that he has never smoked. He does not have any smokeless tobacco history on file. He reports that he does not drink alcohol or use illicit drugs. family history includes Alcohol abuse in an unspecified family member; Arthritis in an unspecified family member; Breast cancer in an unspecified family member; Diabetes in an unspecified family member; Heart disease in an unspecified family member; Hyperlipidemia in an unspecified family member; Kidney disease in an unspecified family member; Pancreatic cancer in an unspecified family member; Prostate cancer in an unspecified family member; and Stroke in an unspecified family member. Allergies  Allergen Reactions  . Fluoxetine Hcl     REACTION: ineffective   Current Outpatient Prescriptions on File Prior to Visit  Medication Sig Dispense Refill  . aspirin 81 MG EC tablet Take 1 tablet (81 mg total) by mouth daily.  30  tablet  11  . atorvastatin (LIPITOR) 10 MG tablet Take 1 tablet (10 mg total) by mouth daily.  90 tablet  3  . calcium carbonate (TUMS) 500 MG chewable tablet Chew 1 tablet by mouth as needed.        Marland Kitchen lisinopril (PRINIVIL,ZESTRIL) 20 MG tablet Take 1 tablet (20 mg total) by mouth daily.  90 tablet  3  . omeprazole (PRILOSEC) 20 MG capsule Take 1 capsule (20 mg total) by mouth daily.  90 capsule  3  . sertraline (ZOLOFT) 100 MG tablet Take 100 mg by mouth daily.        . traZODone (DESYREL) 50 MG tablet Take 50 mg by mouth at bedtime.         Review of Systems Review of Systems  Constitutional: Negative for diaphoresis and unexpected weight change.  HENT: Negative for drooling and tinnitus.   Eyes: Negative for photophobia and visual disturbance.  Respiratory: Negative for choking and stridor.   Gastrointestinal: Negative for vomiting and blood in stool.  Genitourinary: Negative for hematuria and decreased urine volume.  Musculoskeletal: Negative for gait problem.  Skin: Negative for color change and wound.  Neurological: Negative for tremors and numbness.  Psychiatric/Behavioral: Negative for decreased concentration. The patient is not hyperactive.       Objective:   Physical Exam BP 106/62  Pulse 79  Temp(Src) 97.4 F (36.3 C) (Oral)  Ht 5\' 9"  (1.753 m)  Wt 220 lb 6 oz (99.961 kg)  BMI 32.54 kg/m2  SpO2 96% Physical Exam  VS noted, not ill appaering Constitutional: Pt appears well-developed and well-nourished.  HENT: Head: Normocephalic.  Right Ear: External ear normal.  Left Ear: External ear normal.  Eyes: Conjunctivae and EOM are normal. Pupils are equal, round, and reactive to light.  Neck: Normal range of motion. Neck supple.  Cardiovascular: Normal rate and regular rhythm.   Pulmonary/Chest: Effort normal and breath sounds normal.  Abd:  Soft, NT, non-distended, + BS Neurological: Pt is alert.  Skin: Skin is warm. No erythema.  Psychiatric: Pt behavior is normal.  Thought content normal.     Assessment & Plan:

## 2011-09-25 ENCOUNTER — Other Ambulatory Visit: Payer: Self-pay

## 2011-09-25 DIAGNOSIS — I10 Essential (primary) hypertension: Secondary | ICD-10-CM

## 2011-09-25 MED ORDER — ASPIRIN 81 MG PO TBEC: 81.0000 mg | DELAYED_RELEASE_TABLET | Freq: Every day | ORAL | Status: DC

## 2011-09-25 MED ORDER — LISINOPRIL 20 MG PO TABS: 20.0000 mg | ORAL_TABLET | Freq: Every day | ORAL | Status: DC

## 2012-02-03 ENCOUNTER — Ambulatory Visit (INDEPENDENT_AMBULATORY_CARE_PROVIDER_SITE_OTHER): Payer: Medicaid Other | Admitting: Internal Medicine

## 2012-02-03 ENCOUNTER — Other Ambulatory Visit (INDEPENDENT_AMBULATORY_CARE_PROVIDER_SITE_OTHER): Payer: Medicaid Other

## 2012-02-03 ENCOUNTER — Encounter: Payer: Self-pay | Admitting: Internal Medicine

## 2012-02-03 VITALS — BP 140/82 | HR 117 | Temp 98.2°F | Wt 233.0 lb

## 2012-02-03 DIAGNOSIS — R350 Frequency of micturition: Secondary | ICD-10-CM

## 2012-02-03 DIAGNOSIS — M79609 Pain in unspecified limb: Secondary | ICD-10-CM

## 2012-02-03 DIAGNOSIS — I10 Essential (primary) hypertension: Secondary | ICD-10-CM

## 2012-02-03 DIAGNOSIS — H9190 Unspecified hearing loss, unspecified ear: Secondary | ICD-10-CM

## 2012-02-03 DIAGNOSIS — G4710 Hypersomnia, unspecified: Secondary | ICD-10-CM | POA: Insufficient documentation

## 2012-02-03 DIAGNOSIS — M79672 Pain in left foot: Secondary | ICD-10-CM | POA: Insufficient documentation

## 2012-02-03 DIAGNOSIS — M79671 Pain in right foot: Secondary | ICD-10-CM | POA: Insufficient documentation

## 2012-02-03 DIAGNOSIS — H9193 Unspecified hearing loss, bilateral: Secondary | ICD-10-CM | POA: Insufficient documentation

## 2012-02-03 LAB — URINALYSIS, ROUTINE W REFLEX MICROSCOPIC
Bilirubin Urine: NEGATIVE
Hgb urine dipstick: NEGATIVE
Leukocytes, UA: NEGATIVE
Nitrite: NEGATIVE
pH: 6 (ref 5.0–8.0)

## 2012-02-03 NOTE — Progress Notes (Signed)
Subjective:    Patient ID: Phillip Butler, male    DOB: 04-30-1960, 52 y.o.   MRN: [id number]  HPI  Here to f/u;  No longer living at the group home b/c "I got homesick and they werent treating me right."  Living by himself for 2 mo.  Has some hearing bilat difficulty today.  Does ADL's ok.  Does all his own shopping and cooking and laundry.  Needs letter to Soc Security to change his address from the group home to his current home so that he gets disability checks to his current address.  Has had about 1 yr ongoing urinary freq, self diagnosed with overactive bladder, asking if might need tx since he sees ads on TV, urinates about 8 times per day, 1-3 times per night, sometimes has to strain to start, some ? Retention such that he cant get it all out.  Denies urinary symptoms such as urgency, flank pain, hematuria or n/v, fever, chills. Has occasional urinary leakage.  Thinks sertraline at 100 mg might cause sedation for about 2 mo, but also gained 13 lbs since may 2013, and snores at night.  Has been trying to walk more for exercise.  Has pain to both insteps and flat feet.  May 2013 labs with benign UA, PSA normal and glc 117.   Pt denies polydipsia, polyuria. Past Medical History  Diagnosis Date  . Depression   . GERD (gastroesophageal reflux disease)   . Hyperlipidemia   . Anxiety   . Hypertension   . Allergic rhinitis   . OSA (obstructive sleep apnea)    No past surgical history on file.  reports that he has never smoked. He does not have any smokeless tobacco history on file. He reports that he does not drink alcohol or use illicit drugs. family history includes Alcohol abuse in an unspecified family member; Arthritis in an unspecified family member; Breast cancer in an unspecified family member; Diabetes in an unspecified family member; Heart disease in an unspecified family member; Hyperlipidemia in an unspecified family member; Kidney disease in an unspecified family member; Pancreatic  cancer in an unspecified family member; Prostate cancer in an unspecified family member; and Stroke in an unspecified family member. Allergies  Allergen Reactions  . Fluoxetine Hcl     REACTION: ineffective   Current Outpatient Prescriptions on File Prior to Visit  Medication Sig Dispense Refill  . aspirin 81 MG EC tablet Take 1 tablet (81 mg total) by mouth daily.  30 tablet  8  . atorvastatin (LIPITOR) 10 MG tablet Take 1 tablet (10 mg total) by mouth daily.  90 tablet  3  . calcium carbonate (TUMS) 500 MG chewable tablet Chew 1 tablet by mouth as needed.        Marland Kitchen lisinopril (PRINIVIL,ZESTRIL) 20 MG tablet Take 1 tablet (20 mg total) by mouth daily.  30 tablet  8  . omeprazole (PRILOSEC) 20 MG capsule Take 1 capsule (20 mg total) by mouth daily.  90 capsule  3  . sertraline (ZOLOFT) 100 MG tablet Take 100 mg by mouth daily.        . traZODone (DESYREL) 50 MG tablet Take 50 mg by mouth at bedtime.         Review of Systems  Constitutional: Negative for unexpected weight change, or unusual diaphoresis  HENT: Negative for tinnitus.   Eyes: Negative for photophobia and visual disturbance.  Respiratory: Negative for choking and stridor.   Gastrointestinal: Negative for vomiting and blood in  stool.  Genitourinary: Negative for hematuria and decreased urine volume.  Musculoskeletal: Negative for acute joint swelling Skin: Negative for color change and wound.  Neurological: Negative for tremors and numbness other than noted  Psychiatric/Behavioral: Negative for decreased concentration or  hyperactivity.       Objective:   Physical Exam BP 140/82  Pulse 117  Temp 98.2 F (36.8 C) (Oral)  Wt 233 lb (105.688 kg)  SpO2 97% VS noted, , not ill appearing Constitutional: Pt appears well-developed and well-nourished.  HENT: Head: NCAT.  Right Ear: External ear normal.  Left Ear: External ear normal.  Hearing improved with irrigation bilat Eyes: Conjunctivae and EOM are normal. Pupils are  equal, round, and reactive to light.  Neck: Normal range of motion. Neck supple.  Cardiovascular: Normal rate and regular rhythm.   Pulmonary/Chest: Effort normal and breath sounds normal.  Abd:  Soft, NT, non-distended, + BS Neurological: Pt is alert. Not confused  Skin: Skin is warm. No erythema.  Psychiatric: Pt behavior is normal. Mild nervous    Assessment & Plan:

## 2012-02-03 NOTE — Assessment & Plan Note (Signed)
Has bilat pes planus/flat feet - suspect may need orthotics , encouraged working on wt loss, for podiatry refferral

## 2012-02-03 NOTE — Assessment & Plan Note (Signed)
With recent wt gain, doubt related to sertraline, for pulm referral - suspect likely OSA

## 2012-02-03 NOTE — Assessment & Plan Note (Signed)
Suspect prostate or OAB - for UA today, refer urology

## 2012-02-03 NOTE — Assessment & Plan Note (Addendum)
Improved s/p irrigation bilat,  to f/u any worsening symptoms or concerns

## 2012-02-03 NOTE — Assessment & Plan Note (Addendum)
stable overall by history and exam, recent data reviewed with pt, and pt to continue medical treatment as before,  to f/u any worsening symptoms or concerns,  BP Readings from Last 3 Encounters:  02/03/12 140/82  05/29/11 106/62  05/06/11 141/91   Also ok for the letter today as he requests

## 2012-02-03 NOTE — Patient Instructions (Addendum)
Your Ears were both irrigated of wax today You are given the letter to say OK to live at your current address, for Social Security purposes Please continue all other medications as before, since the sertraline is not very likely to cause sleepiness. Please have the pharmacy call with any other refills you may need. You will be contacted regarding the referral for: Pulmonary for possible sleep apnea, Urology for possible enlarged prostate or bladder problem, and Podiatry for the feet pain Please go to the LAB in the Basement (turn left off the elevator) for the urine test to be done today You will be contacted by phone if any changes need to be made immediately.  Otherwise, you will receive a letter about your results with an explanation Please remember to sign up for My Chart if you have not done so, as this will be important to you in the future with finding out test results, communicating by private email, and scheduling acute appointments online when needed. Please return in May 2014, or sooner if needed

## 2012-02-19 ENCOUNTER — Ambulatory Visit: Payer: Medicaid Other | Admitting: Pulmonary Disease

## 2012-03-12 ENCOUNTER — Ambulatory Visit: Payer: Medicaid Other | Admitting: Pulmonary Disease

## 2012-03-26 ENCOUNTER — Ambulatory Visit (INDEPENDENT_AMBULATORY_CARE_PROVIDER_SITE_OTHER): Payer: Medicaid Other | Admitting: Pulmonary Disease

## 2012-03-26 ENCOUNTER — Encounter: Payer: Self-pay | Admitting: Pulmonary Disease

## 2012-03-26 VITALS — BP 120/82 | HR 75 | Temp 97.0°F | Ht 69.0 in | Wt 242.0 lb

## 2012-03-26 DIAGNOSIS — G4733 Obstructive sleep apnea (adult) (pediatric): Secondary | ICD-10-CM

## 2012-03-26 NOTE — Progress Notes (Signed)
Chief Complaint  Patient presents with  . Follow-up    last seen 07/05/09. Per pt he was never set up on CPAP in 2011. Pt reports he is feeling tired during the day, waking up about 3 times at night.     History of Present Illness: Phillip Butler is a 52 y.o. male with severe OSA.  He was last seen in July 05, 2009.  He had sleep study August 14, 2009.  He was never set up with CPAP, and did not follow up with pulmonary/sleep medicine until today.  He continues to have sleep disruption and daytimes sleepiness.  He goes to bed at 9 pm.  He falls asleep quickly, and does not use anything to help sleep.  He wakes up frequently to use the bathroom. He gets out of bed at 530 am.  He feels sleep all day.  He gets frequent headaches.  He is not using anything to help him stay wake.  He can fall asleep easily when sitting quiet.  He does still snore, and wakes up feeling like he is choking.  He can get funny feelings in his legs.  The patient denies sleep walking, sleep talking, bruxism, or nightmares.  The patient denies sleep hallucinations, sleep paralysis, or cataplexy.  His Epworth score is 9 out of 24.  TESTS: PSG 08/14/09 >> AHI 91, SpO2 72%; CPAP 13 cm H2O  [name]  has a past medical history of Depression; GERD (gastroesophageal reflux disease); Hyperlipidemia; Anxiety; Hypertension; Allergic rhinitis; and OSA (obstructive sleep apnea).  [name]  has no past surgical history on file.  Prior to Admission medications   Medication Sig Start Date End Date Taking? Authorizing Provider  aspirin 81 MG EC tablet Take 1 tablet (81 mg total) by mouth daily. 09/25/11  Yes Corwin Levins, MD  calcium carbonate (TUMS) 500 MG chewable tablet Chew 1 tablet by mouth as needed.     Yes Historical Provider, MD  sertraline (ZOLOFT) 100 MG tablet Take 100 mg by mouth daily.     Yes Historical Provider, MD    Allergies  Allergen Reactions  . Fluoxetine Hcl     REACTION: ineffective      Physical Exam:  General - No distress ENT - No sinus tenderness, MP 3, no oral exudate, no LAN Cardiac - s1s2 regular, no murmur Chest - No wheeze/rales/dullness Back - No focal tenderness Abd - Soft, non-tender Ext - No edema Neuro - Normal strength Skin - No rashes Psych - flat affect, difficulty focusing   Assessment/Plan:  Coralyn Helling, MD [facility] Pager:  [phone] 03/26/2012, 10:01 AM

## 2012-03-26 NOTE — Patient Instructions (Signed)
Will arrange for sleep study Will call to arrange for follow up after sleep study reviewed 

## 2012-03-26 NOTE — Assessment & Plan Note (Signed)
He has history of severe obstructive sleep apnea.  He has history of hypertension, and mood disorder.  He has continued snoring, sleep disruption, and daytime sleepiness.  Unfortunately he was never set up with CPAP.  I have explained how sleep apnea can affect the patient's health.  Driving precautions and importance of weight loss were discussed.  Treatment options for sleep apnea were reviewed.  To further assess will arrange for repeat in lab sleep study.  Will likely then need to arrange for CPAP set up.

## 2012-04-12 ENCOUNTER — Ambulatory Visit (HOSPITAL_BASED_OUTPATIENT_CLINIC_OR_DEPARTMENT_OTHER): Payer: Medicaid Other | Attending: Pulmonary Disease | Admitting: Radiology

## 2012-04-12 VITALS — Ht 70.0 in | Wt 230.0 lb

## 2012-04-12 DIAGNOSIS — G4737 Central sleep apnea in conditions classified elsewhere: Secondary | ICD-10-CM | POA: Insufficient documentation

## 2012-04-12 DIAGNOSIS — G4733 Obstructive sleep apnea (adult) (pediatric): Secondary | ICD-10-CM

## 2012-04-12 DIAGNOSIS — I10 Essential (primary) hypertension: Secondary | ICD-10-CM | POA: Insufficient documentation

## 2012-04-19 DIAGNOSIS — G4733 Obstructive sleep apnea (adult) (pediatric): Secondary | ICD-10-CM

## 2012-04-20 ENCOUNTER — Telehealth: Payer: Self-pay | Admitting: Pulmonary Disease

## 2012-04-20 NOTE — Telephone Encounter (Signed)
PSG 04/12/12 >> AHI 74.5, SpO2 low 81%, CA index 8.4.  Pt declined CPAP.  Will have my nurse schedule ROV to review results.

## 2012-04-21 NOTE — Procedures (Signed)
NAME:  Phillip Butler, BELLANCA NO.:  [id number]  MEDICAL RECORD NO.:  [id number]          PATIENT TYPE:  OUT  LOCATION:  SLEEP CENTER                 FACILITY:  [facility]  PHYSICIAN:  Coralyn Helling, MD        DATE OF BIRTH:  09-29-1960  DATE OF STUDY:                           NOCTURNAL POLYSOMNOGRAM  REFERRING PHYSICIAN:  Coralyn Helling, MD  INDICATION FOR STUDY:  Mr. Asfaw is a 52 year old male who has a history of hypertension and mood disorder.  He was previously diagnosed with severe obstructive sleep apnea after having a sleep study from August 14, 2009, and was found to have an apnea/hypopnea index of 91 and an oxygen saturation nadir of 72%.  He has never been on therapy for his sleep apnea.  He continues to have snoring, sleep disruption, and daytime sleepiness.  He is referred to the sleep lab for further evaluation of hypersomnia with obstructive sleep apnea.  Height is 5 feet 10 inches, weight is 230 pounds, BMI is 33, neck size is 18.5 inches.  EPWORTH SLEEPINESS SCORE:  10.  MEDICATIONS:  Aspirin, Tums, Zoloft, and trazodone.  SLEEP ARCHITECTURE:  Total recording time was 423 minutes, total sleep time was 278 minutes.  Sleep efficiency was 66%, sleep latency was 52 minutes.  REM latency was 146 minutes.  The study was notable for lack of slow-wave sleep and he slept in both supine and nonsupine positions.  RESPIRATORY DATA:  The average respiratory was 24.  Moderate snoring was noted by the technician.  The overall apnea/hypopnea index was 74.5. There were 39 central apneic events.  There were 52 mixed respiratory events.  The remainder of the events were obstructive in nature.  The central apnea index was 8.4.  The mixed apnea index was 11.2.  Of note is that the patient met split night study criteria.  However, he declined to be tried on CPAP in the 2nd half of the study.  OXYGEN DATA:  The baseline oxygenation was 95%.  The oxygen saturation nadir was 81%.   The study was conducted without the use of oxygen.  The average heart rate 72 and the rhythm strip showed sinus rhythm with occasional PVCs.  CARDIAC DATA:  MOVEMENT-PARASOMNIA:  The periodic limb movement index was 0 and the patient had 1 restroom trip.  IMPRESSION-RECOMMENDATIONS:  This study shows evidence for severe complex sleep apnea with an overall apnea/hypopnea index of 74.5.  He also had an oxygen saturation nadir of 81%.  He had both obstructive and central respiratory events.  In addition to diet, exercise, and weight reduction, I would recommend that the patient return to the sleep lab for a CPAP titration study.  At that time, it will be determine if CPAP is adequate or if he would need to consider using BiPAP therapy plus or minus supplemental oxygen.     Coralyn Helling, MD Diplomat, American Board of Sleep Medicine    VS/MEDQ  D:  04/20/2012 13:27:06  T:  04/21/2012 02:42:07  Job:  [id number]

## 2012-04-21 NOTE — Telephone Encounter (Signed)
lmomtcb x1 

## 2012-04-22 NOTE — Telephone Encounter (Signed)
lmomtcb x 2  

## 2012-04-23 ENCOUNTER — Encounter: Payer: Self-pay | Admitting: *Deleted

## 2012-04-23 NOTE — Telephone Encounter (Signed)
Noted  

## 2012-04-23 NOTE — Telephone Encounter (Signed)
lmomtcb x3 for pt. Called alternate # in chart for tp and it was d/c.  Will send letter and forward to Dr. Craige Cotta as Lorain Childes

## 2012-05-27 ENCOUNTER — Ambulatory Visit: Payer: Medicaid Other | Admitting: Internal Medicine

## 2012-12-16 ENCOUNTER — Other Ambulatory Visit: Payer: Self-pay | Admitting: Internal Medicine

## 2013-02-09 ENCOUNTER — Ambulatory Visit (INDEPENDENT_AMBULATORY_CARE_PROVIDER_SITE_OTHER): Payer: Self-pay | Admitting: Internal Medicine

## 2013-02-09 ENCOUNTER — Encounter: Payer: Self-pay | Admitting: Internal Medicine

## 2013-02-09 VITALS — BP 122/78 | HR 100 | Temp 97.9°F | Wt 235.5 lb

## 2013-02-09 DIAGNOSIS — I10 Essential (primary) hypertension: Secondary | ICD-10-CM

## 2013-02-09 DIAGNOSIS — M79672 Pain in left foot: Secondary | ICD-10-CM

## 2013-02-09 DIAGNOSIS — J069 Acute upper respiratory infection, unspecified: Secondary | ICD-10-CM | POA: Insufficient documentation

## 2013-02-09 DIAGNOSIS — M79609 Pain in unspecified limb: Secondary | ICD-10-CM

## 2013-02-09 MED ORDER — SERTRALINE HCL 100 MG PO TABS: 100.0000 mg | ORAL_TABLET | Freq: Every day | ORAL | Status: DC

## 2013-02-09 MED ORDER — AZITHROMYCIN 250 MG PO TABS: ORAL_TABLET | ORAL | Status: DC

## 2013-02-09 MED ORDER — FESOTERODINE FUMARATE ER 4 MG PO TB24: 4.0000 mg | ORAL_TABLET | Freq: Every day | ORAL | Status: DC

## 2013-02-09 MED ORDER — TRAZODONE HCL 50 MG PO TABS: ORAL_TABLET | ORAL | Status: DC

## 2013-02-09 MED ORDER — OMEPRAZOLE 20 MG PO CPDR: 20.0000 mg | DELAYED_RELEASE_CAPSULE | Freq: Every day | ORAL | Status: DC

## 2013-02-09 MED ORDER — LISINOPRIL 20 MG PO TABS: 20.0000 mg | ORAL_TABLET | Freq: Every day | ORAL | Status: DC

## 2013-02-09 NOTE — Assessment & Plan Note (Signed)
Instep with some pes planus; ok for podiatry referral, but may need to see his PCP assigned per [place] access to get this done;  O/w can try new shoes and otc orthotics to see if helps

## 2013-02-09 NOTE — Assessment & Plan Note (Signed)
stable overall by history and exam, recent data reviewed with pt, and pt to continue medical treatment as before,  to f/u any worsening symptoms or concerns BP Readings from Last 3 Encounters:  02/09/13 122/78  03/26/12 120/82  02/03/12 140/82

## 2013-02-09 NOTE — Assessment & Plan Note (Signed)
Mild to mod, for antibx course,  to f/u any worsening symptoms or concerns 

## 2013-02-09 NOTE — Progress Notes (Signed)
Subjective:    Patient ID: Phillip Butler, male    DOB: 1960/08/17, 53 y.o.   MRN: [id number]  HPI    Here with 2-3 days acute onset fever, facial pain, left earache, pressure, headache, general weakness and malaise, and greenish d/c, with mild ST and cough, but pt denies chest pain, wheezing, increased sob or doe, orthopnea, PND, increased LE swelling, palpitations, dizziness or syncope. Also c/o several wks left foot instep pain without trauma, ulcer, red, swelling, but tends to stand and walk quite a bit.  Asks for tetanus but is not yet due until next yr.  Needs mult med refills.  Pt denies polydipsia, polyuria. Pt denies new neurological symptoms such as new headache, or facial or extremity weakness or numbness other than above.   Past Medical History  Diagnosis Date  . Depression   . GERD (gastroesophageal reflux disease)   . Hyperlipidemia   . Anxiety   . Hypertension   . Allergic rhinitis   . OSA (obstructive sleep apnea)    No past surgical history on file.  reports that he has never smoked. He does not have any smokeless tobacco history on file. He reports that he does not drink alcohol or use illicit drugs. family history includes Alcohol abuse in an other family member; Arthritis in an other family member; Breast cancer in an other family member; Diabetes in an other family member; Heart disease in an other family member; Hyperlipidemia in an other family member; Kidney disease in an other family member; Pancreatic cancer in an other family member; Prostate cancer in an other family member; Stroke in an other family member. Allergies  Allergen Reactions  . Fluoxetine Hcl     REACTION: ineffective   Current Outpatient Prescriptions on File Prior to Visit  Medication Sig Dispense Refill  . aspirin 81 MG EC tablet Take 1 tablet (81 mg total) by mouth daily.  30 tablet  8  . calcium carbonate (TUMS) 500 MG chewable tablet Chew 1 tablet by mouth as needed.        . traZODone  (DESYREL) 50 MG tablet TAKE 1 TABLET BY MOUTH AT BEDTIME  30 tablet  2   No current facility-administered medications on file prior to visit.    Pt agrees to be seen, has [place] access and seems to understand the billing may not be approved by his insurance.   Review of Systems  Constitutional: Negative for unexpected weight change, or unusual diaphoresis  HENT: Negative for tinnitus.   Eyes: Negative for photophobia and visual disturbance.  Respiratory: Negative for choking and stridor.   Gastrointestinal: Negative for vomiting and blood in stool.  Genitourinary: Negative for hematuria and decreased urine volume.  Musculoskeletal: Negative for acute joint swelling Skin: Negative for color change and wound.  Neurological: Negative for tremors and numbness other than noted  Psychiatric/Behavioral: Negative for decreased concentration or  hyperactivity.       Objective:   Physical Exam BP 122/78  Pulse 100  Temp(Src) 97.9 F (36.6 C) (Oral)  Wt 235 lb 8 oz (106.822 kg)  SpO2 95% VS noted, mild ill Constitutional: Pt appears well-developed and well-nourished.  HENT: Head: NCAT.  Right Ear: External ear normal.  Left Ear: External ear normal.  Bilat tm's with mild erythema left > right.  Max sinus areas mild tender.  Pharynx with mild erythema, no exudate Eyes: Conjunctivae and EOM are normal. Pupils are equal, round, and reactive to light.  Neck: Normal range of motion.  Neck supple.  Cardiovascular: Normal rate and regular rhythm.   Pulmonary/Chest: Effort normal and breath sounds normal.  Abd:  Soft, NT, non-distended, + BS Neurological: Pt is alert. Not confused - at baseline most likely, has mental retardation Skin: Skin is warm. No erythema.  Psychiatric: Pt behavior is normal.     Assessment & Plan:

## 2013-02-09 NOTE — Patient Instructions (Signed)
Please take all new medication as prescribed - the antibiotic You can also take Delsym OTC for cough, and/or Mucinex (or it's generic off brand) for congestion, and tylenol as needed for pain. Please continue all other medications as before, and refills have been done Please have the pharmacy call with any other refills you may need.  Your tetanus update will be due in 2016 (ok to wait until then)  You will be contacted regarding the referral for: Podiatry, but if we are unable to do this because of your insurance, please see your "assigned physician" for a referral, or try an OTC orthotic (shoe insert with builtup instep) such as most Drug Stores or [facility] ([facility] [address]

## 2013-08-20 ENCOUNTER — Other Ambulatory Visit: Payer: Self-pay | Admitting: Internal Medicine

## 2014-02-18 ENCOUNTER — Emergency Department (HOSPITAL_COMMUNITY): Payer: Medicare Other

## 2014-02-18 ENCOUNTER — Inpatient Hospital Stay (HOSPITAL_COMMUNITY)
Admission: EM | Admit: 2014-02-18 | Discharge: 2014-02-22 | DRG: 871 | Disposition: A | Discharge status: Home Health | Payer: Medicare Other | Attending: Internal Medicine | Admitting: Internal Medicine

## 2014-02-18 ENCOUNTER — Encounter (HOSPITAL_COMMUNITY): Payer: Self-pay | Admitting: Emergency Medicine

## 2014-02-18 DIAGNOSIS — D638 Anemia in other chronic diseases classified elsewhere: Secondary | ICD-10-CM | POA: Diagnosis present

## 2014-02-18 DIAGNOSIS — D649 Anemia, unspecified: Secondary | ICD-10-CM | POA: Diagnosis present

## 2014-02-18 DIAGNOSIS — R05 Cough: Secondary | ICD-10-CM | POA: Diagnosis not present

## 2014-02-18 DIAGNOSIS — J984 Other disorders of lung: Secondary | ICD-10-CM | POA: Diagnosis not present

## 2014-02-18 DIAGNOSIS — J9601 Acute respiratory failure with hypoxia: Secondary | ICD-10-CM | POA: Diagnosis present

## 2014-02-18 DIAGNOSIS — J189 Pneumonia, unspecified organism: Secondary | ICD-10-CM | POA: Diagnosis not present

## 2014-02-18 DIAGNOSIS — A419 Sepsis, unspecified organism: Secondary | ICD-10-CM | POA: Diagnosis not present

## 2014-02-18 DIAGNOSIS — G4733 Obstructive sleep apnea (adult) (pediatric): Secondary | ICD-10-CM | POA: Diagnosis not present

## 2014-02-18 DIAGNOSIS — R0602 Shortness of breath: Secondary | ICD-10-CM | POA: Diagnosis not present

## 2014-02-18 DIAGNOSIS — K219 Gastro-esophageal reflux disease without esophagitis: Secondary | ICD-10-CM | POA: Diagnosis present

## 2014-02-18 DIAGNOSIS — F418 Other specified anxiety disorders: Secondary | ICD-10-CM | POA: Diagnosis not present

## 2014-02-18 DIAGNOSIS — I10 Essential (primary) hypertension: Secondary | ICD-10-CM

## 2014-02-18 DIAGNOSIS — Z7982 Long term (current) use of aspirin: Secondary | ICD-10-CM

## 2014-02-18 DIAGNOSIS — F329 Major depressive disorder, single episode, unspecified: Secondary | ICD-10-CM | POA: Diagnosis present

## 2014-02-18 DIAGNOSIS — E785 Hyperlipidemia, unspecified: Secondary | ICD-10-CM | POA: Diagnosis present

## 2014-02-18 DIAGNOSIS — Z79899 Other long term (current) drug therapy: Secondary | ICD-10-CM | POA: Diagnosis not present

## 2014-02-18 DIAGNOSIS — R059 Cough, unspecified: Secondary | ICD-10-CM

## 2014-02-18 HISTORY — DX: Anemia in other chronic diseases classified elsewhere: D63.8

## 2014-02-18 HISTORY — DX: Bladder-neck obstruction: N32.0

## 2014-02-18 LAB — URINE MICROSCOPIC-ADD ON

## 2014-02-18 LAB — BASIC METABOLIC PANEL
ANION GAP: 8 (ref 5–15)
BUN: 17 mg/dL (ref 6–23)
CHLORIDE: 107 mmol/L (ref 96–112)
CO2: 22 mmol/L (ref 19–32)
CREATININE: 0.79 mg/dL (ref 0.50–1.35)
Calcium: 8.4 mg/dL (ref 8.4–10.5)
GFR calc Af Amer: >90 mL/min (ref >90)
GFR calc non Af Amer: >90 mL/min (ref >90)
Glucose, Bld: 111 mg/dL — ABNORMAL HIGH (ref 70–99)
Potassium: 4.2 mmol/L (ref 3.5–5.1)
Sodium: 137 mmol/L (ref 135–145)

## 2014-02-18 LAB — URINALYSIS, ROUTINE W REFLEX MICROSCOPIC
GLUCOSE, UA: NEGATIVE mg/dL
Hgb urine dipstick: NEGATIVE
KETONES UR: NEGATIVE mg/dL
Leukocytes, UA: NEGATIVE
Nitrite: NEGATIVE
PH: 5.5 (ref 5.0–8.0)
Protein, ur: 100 mg/dL — AB
Specific Gravity, Urine: 1.033 — ABNORMAL HIGH (ref 1.005–1.030)
Urobilinogen, UA: 1 mg/dL (ref 0.0–1.0)

## 2014-02-18 LAB — CBC WITH DIFFERENTIAL/PLATELET
BASOS ABS: 0 10*3/uL (ref 0.0–0.1)
BASOS PCT: 0 % (ref 0–1)
EOS ABS: 0.1 10*3/uL (ref 0.0–0.7)
Eosinophils Relative: 2 % (ref 0–5)
HCT: 36 % — ABNORMAL LOW (ref 39.0–52.0)
Hemoglobin: 12 g/dL — ABNORMAL LOW (ref 13.0–17.0)
Lymphocytes Relative: 18 % (ref 12–46)
Lymphs Abs: 1.5 10*3/uL (ref 0.7–4.0)
MCH: 29.1 pg (ref 26.0–34.0)
MCHC: 33.3 g/dL (ref 30.0–36.0)
MCV: 87.4 fL (ref 78.0–100.0)
Monocytes Absolute: 0.7 10*3/uL (ref 0.1–1.0)
Monocytes Relative: 9 % (ref 3–12)
Neutro Abs: 5.8 10*3/uL (ref 1.7–7.7)
Neutrophils Relative %: 71 % (ref 43–77)
Platelets: 410 10*3/uL — ABNORMAL HIGH (ref 150–400)
RBC: 4.12 MIL/uL — ABNORMAL LOW (ref 4.22–5.81)
RDW: 12.6 % (ref 11.5–15.5)
WBC: 8.2 10*3/uL (ref 4.0–10.5)

## 2014-02-18 LAB — I-STAT CG4 LACTIC ACID, ED: Lactic Acid, Venous: 0.8 mmol/L (ref 0.5–2.0)

## 2014-02-18 MED ORDER — DEXTROSE 5 % IV SOLN
500.0000 mg | INTRAVENOUS | Status: DC
  Administered 2014-02-18: 500 mg via INTRAVENOUS
  Filled 2014-02-18: qty 500

## 2014-02-18 MED ORDER — DEXTROSE 5 % IV SOLN
1.0000 g | INTRAVENOUS | Status: DC
  Administered 2014-02-18: 1 g via INTRAVENOUS

## 2014-02-18 MED ORDER — SODIUM CHLORIDE 0.9 % IV SOLN
INTRAVENOUS | Status: DC
  Administered 2014-02-18: 20:00:00 via INTRAVENOUS

## 2014-02-18 NOTE — ED Notes (Addendum)
Pt reports  Fever, productive cough, shortness of breath, and central chest tightness. Pt afebrile at present time and pt denies checking temperature or taking anything at home to treat fever. Was seen at urgent care and diagnosed with pneumonia; sent here for tachycardia and low oxygen saturation on RA.

## 2014-02-18 NOTE — H&P (Signed)
PCP:  Oliver Barre, MD    Chief Complaint:  Cough shortness of breath HPI: Phillip Butler is a 54 y.o. male   has a past medical history of Depression; GERD (gastroesophageal reflux disease); Hyperlipidemia; Anxiety; Hypertension; Allergic rhinitis; and OSA (obstructive sleep apnea).    patient presented to Urgent care with shortness of breath and cough. With hx of fevers ,chills feeling unwell for the past 10 days. Not a smoker, no recent travel. He was found to have tachycardia up to 120 and oxygen saturation down to 90%. He was sent to ER and was found to have bilateral CPA on CXR. Reports no sick contacts. No leg swelling. No chest pain but have had mild right upper quadrant discomfort.    Hospitalist was called for admission for CAP  Review of Systems:    Pertinent positives include: Fevers, chills, fatigue, productive cough  Constitutional:  No weight loss, night sweats, weight loss  HEENT:  No headaches, Difficulty swallowing,Tooth/dental problems,Sore throat,  No sneezing, itching, ear ache, nasal congestion, post nasal drip,  Cardio-vascular:  No chest pain, Orthopnea, PND, anasarca, dizziness, palpitations.no Bilateral lower extremity swelling  GI:  No heartburn, indigestion, abdominal pain, nausea, vomiting, diarrhea, change in bowel habits, loss of appetite, melena, blood in stool, hematemesis Resp:  no shortness of breath at rest. No dyspnea on exertion, No excess mucus, no , No non-productive cough, No coughing up of blood.No change in color of mucus.No wheezing. Skin:  no rash or lesions. No jaundice GU:  no dysuria, change in color of urine, no urgency or frequency. No straining to urinate.  No flank pain.  Musculoskeletal:  No joint pain or no joint swelling. No decreased range of motion. No back pain.  Psych:  No change in mood or affect. No depression or anxiety. No memory loss.  Neuro: no localizing neurological complaints, no tingling, no weakness, no  double vision, no gait abnormality, no slurred speech, no confusion  Otherwise ROS are negative except for above, 10 systems were reviewed  Past Medical History: Past Medical History  Diagnosis Date  . Depression   . GERD (gastroesophageal reflux disease)   . Hyperlipidemia   . Anxiety   . Hypertension   . Allergic rhinitis   . OSA (obstructive sleep apnea)    History reviewed. No pertinent past surgical history.   Medications: Prior to Admission medications   Medication Sig Start Date End Date Taking? Authorizing Provider  aspirin 81 MG EC tablet Take 1 tablet (81 mg total) by mouth daily. 09/25/11  Yes Corwin Levins, MD  calcium carbonate (TUMS) 500 MG chewable tablet Chew 1 tablet by mouth as needed.     Yes Historical Provider, MD  fesoterodine (TOVIAZ) 4 MG TB24 tablet Take 1 tablet (4 mg total) by mouth daily. 02/09/13  Yes Corwin Levins, MD  lisinopril (PRINIVIL,ZESTRIL) 20 MG tablet TAKE 1 TABLET BY MOUTH EVERY DAY 08/20/13  Yes Corwin Levins, MD  omeprazole (PRILOSEC) 20 MG capsule Take 1 capsule (20 mg total) by mouth daily. 02/09/13  Yes Corwin Levins, MD  sertraline (ZOLOFT) 100 MG tablet Take 1 tablet (100 mg total) by mouth daily. 02/09/13  Yes Corwin Levins, MD  traZODone (DESYREL) 50 MG tablet TAKE 1 TABLET BY MOUTH AT BEDTIME 02/09/13  Yes Corwin Levins, MD  azithromycin [facility] Z-PAK) 250 MG tablet Use as directed 02/09/13   Corwin Levins, MD    Allergies:   Allergies  Allergen Reactions  .  Fluoxetine Hcl     REACTION: ineffective    Social History:  Ambulatory   Independently  Lives at home alone,        reports that he has never smoked. He does not have any smokeless tobacco history on file. He reports that he does not drink alcohol or use illicit drugs.    Family History: family history includes Alcohol abuse in an other family member; Arthritis in an other family member; Breast cancer in an other family member; Diabetes in an other family member; Heart  disease in his father and another family member; Hyperlipidemia in an other family member; Kidney disease in an other family member; Pancreatic cancer in an other family member; Prostate cancer in an other family member; Stroke in an other family member.    Physical Exam: Patient Vitals for the past 24 hrs:  BP Temp Temp src Pulse Resp SpO2  02/18/14 2106 138/75 mmHg - - 114 20 90 %  02/18/14 1901 - - - - - 92 %  02/18/14 1858 155/88 mmHg 98.9 F (37.2 C) Oral (!) 123 20 92 %  02/18/14 1844 149/88 mmHg 98 F (36.7 C) Oral 119 22 91 %    1. General:  in No Acute distress 2. Psychological: Alert and  Oriented 3. Head/ENT:   Dry Mucous Membranes                          Head Non traumatic, neck supple                             Poor Dentition 4. SKIN:  decreased Skin turgor,  Skin clean Dry and intact no rash 5. Heart: Regular rate and rhythm no Murmur, Rub or gallop 6. Lungs: Clear to auscultation bilaterally, no wheezes or crackles   7. Abdomen: Soft, mild RUQ tenderness, Non distended 8. Lower extremities: no clubbing, cyanosis, or edema 9. Neurologically Grossly intact, moving all 4 extremities equally 10. MSK: Normal range of motion  body mass index is unknown because there is no weight on file.   Labs on Admission:   Results for orders placed or performed during the hospital encounter of 02/18/14 (from the past 24 hour(s))  CBC with Differential/Platelet     Status: Abnormal   Collection Time: 02/18/14  7:17 PM  Result Value Ref Range   WBC 8.2 4.0 - 10.5 K/uL   RBC 4.12 (L) 4.22 - 5.81 MIL/uL   Hemoglobin 12.0 (L) 13.0 - 17.0 g/dL   HCT 62.136.0 (L) 30.839.0 - 65.752.0 %   MCV 87.4 78.0 - 100.0 fL   MCH 29.1 26.0 - 34.0 pg   MCHC 33.3 30.0 - 36.0 g/dL   RDW 84.612.6 96.211.5 - 95.215.5 %   Platelets 410 (H) 150 - 400 K/uL   Neutrophils Relative % 71 43 - 77 %   Neutro Abs 5.8 1.7 - 7.7 K/uL   Lymphocytes Relative 18 12 - 46 %   Lymphs Abs 1.5 0.7 - 4.0 K/uL   Monocytes Relative 9 3 - 12  %   Monocytes Absolute 0.7 0.1 - 1.0 K/uL   Eosinophils Relative 2 0 - 5 %   Eosinophils Absolute 0.1 0.0 - 0.7 K/uL   Basophils Relative 0 0 - 1 %   Basophils Absolute 0.0 0.0 - 0.1 K/uL  Basic metabolic panel     Status: Abnormal   Collection Time: 02/18/14  7:17 PM  Result Value Ref Range   Sodium 137 135 - 145 mmol/L   Potassium 4.2 3.5 - 5.1 mmol/L   Chloride 107 96 - 112 mmol/L   CO2 22 19 - 32 mmol/L   Glucose, Bld 111 (H) 70 - 99 mg/dL   BUN 17 6 - 23 mg/dL   Creatinine, Ser 4.09 0.50 - 1.35 mg/dL   Calcium 8.4 8.4 - 81.1 mg/dL   GFR calc non Af Amer >90 >90 mL/min   GFR calc Af Amer >90 >90 mL/min   Anion gap 8 5 - 15  I-Stat CG4 Lactic Acid, ED     Status: None   Collection Time: 02/18/14  7:29 PM  Result Value Ref Range   Lactic Acid, Venous 0.80 0.5 - 2.0 mmol/L  Urinalysis, Routine w reflex microscopic     Status: Abnormal   Collection Time: 02/18/14  9:07 PM  Result Value Ref Range   Color, Urine AMBER (A) YELLOW   APPearance CLEAR CLEAR   Specific Gravity, Urine 1.033 (H) 1.005 - 1.030   pH 5.5 5.0 - 8.0   Glucose, UA NEGATIVE NEGATIVE mg/dL   Hgb urine dipstick NEGATIVE NEGATIVE   Bilirubin Urine SMALL (A) NEGATIVE   Ketones, ur NEGATIVE NEGATIVE mg/dL   Protein, ur 914 (A) NEGATIVE mg/dL   Urobilinogen, UA 1.0 0.0 - 1.0 mg/dL   Nitrite NEGATIVE NEGATIVE   Leukocytes, UA NEGATIVE NEGATIVE  Urine microscopic-add on     Status: Abnormal   Collection Time: 02/18/14  9:07 PM  Result Value Ref Range   Squamous Epithelial / LPF RARE RARE   WBC, UA 3-6 <3 WBC/hpf   RBC / HPF 3-6 <3 RBC/hpf   Bacteria, UA FEW (A) RARE   Casts GRANULAR CAST (A) NEGATIVE   Urine-Other MUCOUS PRESENT     UA concentrated, 3-6 WBC and 3-6 RBC  Lab Results  Component Value Date   HGBA1C 5.6 05/29/2011    CrCl cannot be calculated (Unknown ideal weight.).  BNP (last 3 results) No results for input(s): PROBNP in the last 8760 hours.  Other results:  I have pearsonaly  reviewed this: ECG REPORT not obtained will order   There were no vitals filed for this visit.   Cultures: No results found for: SDES, SPECREQUEST, CULT, REPTSTATUS   Radiological Exams on Admission: Dg Chest 2 View  02/18/2014   CLINICAL DATA:  Cough, congestion, shortness of breath for 1 week  EXAM: CHEST  2 VIEW  COMPARISON:  04/02/2011  FINDINGS: Bilateral lower lobe hazy airspace disease, left greater than right. No pleural effusion or pneumothorax. Stable cardiomediastinal silhouette. No acute osseous abnormality.  IMPRESSION: Bilateral lower lobe hazy airspace disease concerning for bibasilar pneumonia.   Electronically Signed   By: Elige Ko   On: 02/18/2014 19:57    Chart has been reviewed  Assessment/Plan  54 yo M with hx of HTN here with tachycardia and CAP with evidence of sepsis  Present on Admission:  . Community acquired pneumonia -  - will admit for treatment of CAP will start on appropriate antibiotic coverage.   Obtain sputum cultures, blood cultures if febrile or if decompensates.  Provide oxygen as needed. Order influenza PCR . Essential hypertension - continue home meds, no hypotension noted Hypoxia, mild - given shortness of breath and relatively mild CXR findings will obtain d.dimer, troponin . Sepsis - given , hypoxia, tachycardia and tachypnea, give IVF, blood cultures obtained, order pro calcitonin level RUQ discomfort will obtain  hepatic panel  Prophylaxis:   Lovenox, Protonix  CODE STATUS:  FULL CODE    Other plan as per orders.  I have spent a total of 55 min on this admission  Ellisa Devivo 02/18/2014, 10:42 PM  [facility]  Pager [phone]   after 2 AM please page floor coverage PA If 7AM-7PM, please contact the day team taking care of the patient  [email or web address]  Password [email or web address]

## 2014-02-18 NOTE — ED Provider Notes (Signed)
CSN: [id number]     Arrival date & time 02/18/14  1843 History   First MD Initiated Contact with Patient 02/18/14 1912     No chief complaint on file.    (Consider location/radiation/quality/duration/timing/severity/associated sxs/prior Treatment) HPI Comments: Patient here with increased fever cough congestion 10 days. Seen in by his physician today and diagnosed with pneumonia and sent here due to hypoxia and tachycardia. He denies any hemoptysis, night sweats, recent travel history. No CHF or anginal type symptoms. Denies any use of tobacco products. No vomiting or diarrhea. Symptoms persistent and he becomes more short of breath with exertion and better with rest. Has used over-the-counter medications without relief.  The history is provided by the patient.    Past Medical History  Diagnosis Date  . Depression   . GERD (gastroesophageal reflux disease)   . Hyperlipidemia   . Anxiety   . Hypertension   . Allergic rhinitis   . OSA (obstructive sleep apnea)    History reviewed. No pertinent past surgical history. Family History  Problem Relation Age of Onset  . Alcohol abuse    . Arthritis    . Breast cancer    . Prostate cancer    . Hyperlipidemia    . Heart disease    . Stroke    . Kidney disease    . Diabetes    . Pancreatic cancer     History  Substance Use Topics  . Smoking status: Never Smoker   . Smokeless tobacco: Not on file  . Alcohol Use: No    Review of Systems  All other systems reviewed and are negative.     Allergies  Fluoxetine hcl  Home Medications   Prior to Admission medications   Medication Sig Start Date End Date Taking? Authorizing Provider  aspirin 81 MG EC tablet Take 1 tablet (81 mg total) by mouth daily. 09/25/11   Corwin Levins, MD  azithromycin (ZITHROMAX Z-PAK) 250 MG tablet Use as directed 02/09/13   Corwin Levins, MD  calcium carbonate (TUMS) 500 MG chewable tablet Chew 1 tablet by mouth as needed.      Historical Provider, MD   fesoterodine (TOVIAZ) 4 MG TB24 tablet Take 1 tablet (4 mg total) by mouth daily. 02/09/13   Corwin Levins, MD  lisinopril (PRINIVIL,ZESTRIL) 20 MG tablet TAKE 1 TABLET BY MOUTH EVERY DAY 08/20/13   Corwin Levins, MD  omeprazole (PRILOSEC) 20 MG capsule Take 1 capsule (20 mg total) by mouth daily. 02/09/13   Corwin Levins, MD  sertraline (ZOLOFT) 100 MG tablet Take 1 tablet (100 mg total) by mouth daily. 02/09/13   Corwin Levins, MD  traZODone (DESYREL) 50 MG tablet TAKE 1 TABLET BY MOUTH AT BEDTIME 02/09/13   Corwin Levins, MD   BP 155/88 mmHg  Pulse 123  Temp(Src) 98.9 F (37.2 C) (Oral)  Resp 20  SpO2 92% Physical Exam  Constitutional: He is oriented to person, place, and time. He appears well-developed and well-nourished.  Non-toxic appearance. No distress.  HENT:  Head: Normocephalic and atraumatic.  Eyes: Conjunctivae, EOM and lids are normal. Pupils are equal, round, and reactive to light.  Neck: Normal range of motion. Neck supple. No tracheal deviation present. No thyroid mass present.  Cardiovascular: Regular rhythm and normal heart sounds.  Tachycardia present.  Exam reveals no gallop.   No murmur heard. Pulmonary/Chest: Effort normal and breath sounds normal. No stridor. No respiratory distress. He has no decreased breath sounds. He  has no wheezes. He has no rhonchi. He has no rales.  Abdominal: Soft. Normal appearance and bowel sounds are normal. He exhibits no distension. There is no tenderness. There is no rebound and no CVA tenderness.  Musculoskeletal: Normal range of motion. He exhibits no edema or tenderness.  Neurological: He is alert and oriented to person, place, and time. He has normal strength. No cranial nerve deficit or sensory deficit. GCS eye subscore is 4. GCS verbal subscore is 5. GCS motor subscore is 6.  Skin: Skin is warm and dry. No abrasion and no rash noted.  Psychiatric: He has a normal mood and affect. His speech is normal and behavior is normal.  Nursing note  and vitals reviewed.   ED Course  Procedures (including critical care time) Labs Review Labs Reviewed  CBC WITH DIFFERENTIAL/PLATELET - Abnormal; Notable for the following:    RBC 4.12 (*)    Hemoglobin 12.0 (*)    HCT 36.0 (*)    Platelets 410 (*)    All other components within normal limits  CULTURE, BLOOD (ROUTINE X 2)  CULTURE, BLOOD (ROUTINE X 2)  BASIC METABOLIC PANEL  URINALYSIS, ROUTINE W REFLEX MICROSCOPIC  I-STAT CG4 LACTIC ACID, ED    Imaging Review No results found.   EKG Interpretation None      MDM   Final diagnoses:  Cough    Patient with community acquired pneumonia and will be started on antibiotics and admitted to the hospital service    Toy BakerAnthony T Noni Stonesifer, MD 02/18/14 1956

## 2014-02-19 ENCOUNTER — Encounter (HOSPITAL_COMMUNITY): Payer: Self-pay | Admitting: Internal Medicine

## 2014-02-19 DIAGNOSIS — E785 Hyperlipidemia, unspecified: Secondary | ICD-10-CM | POA: Insufficient documentation

## 2014-02-19 DIAGNOSIS — J9601 Acute respiratory failure with hypoxia: Secondary | ICD-10-CM

## 2014-02-19 DIAGNOSIS — D649 Anemia, unspecified: Secondary | ICD-10-CM | POA: Diagnosis present

## 2014-02-19 DIAGNOSIS — K219 Gastro-esophageal reflux disease without esophagitis: Secondary | ICD-10-CM

## 2014-02-19 DIAGNOSIS — G4733 Obstructive sleep apnea (adult) (pediatric): Secondary | ICD-10-CM

## 2014-02-19 DIAGNOSIS — F418 Other specified anxiety disorders: Secondary | ICD-10-CM | POA: Diagnosis present

## 2014-02-19 LAB — EXPECTORATED SPUTUM ASSESSMENT W GRAM STAIN, RFLX TO RESP C

## 2014-02-19 LAB — CBC WITH DIFFERENTIAL/PLATELET
BASOS ABS: 0 10*3/uL (ref 0.0–0.1)
BASOS PCT: 0 % (ref 0–1)
Eosinophils Absolute: 0.2 10*3/uL (ref 0.0–0.7)
Eosinophils Relative: 2 % (ref 0–5)
HCT: 31.8 % — ABNORMAL LOW (ref 39.0–52.0)
HEMOGLOBIN: 10.4 g/dL — AB (ref 13.0–17.0)
LYMPHS ABS: 1.6 10*3/uL (ref 0.7–4.0)
Lymphocytes Relative: 19 % (ref 12–46)
MCH: 29 pg (ref 26.0–34.0)
MCHC: 32.7 g/dL (ref 30.0–36.0)
MCV: 88.6 fL (ref 78.0–100.0)
MONO ABS: 1 10*3/uL (ref 0.1–1.0)
Monocytes Relative: 12 % (ref 3–12)
Neutro Abs: 5.6 10*3/uL (ref 1.7–7.7)
Neutrophils Relative %: 67 % (ref 43–77)
Platelets: 357 10*3/uL (ref 150–400)
RBC: 3.59 MIL/uL — AB (ref 4.22–5.81)
RDW: 12.6 % (ref 11.5–15.5)
WBC: 8.4 10*3/uL (ref 4.0–10.5)

## 2014-02-19 LAB — CBC
HEMATOCRIT: 33.8 % — AB (ref 39.0–52.0)
Hemoglobin: 11 g/dL — ABNORMAL LOW (ref 13.0–17.0)
MCH: 28.5 pg (ref 26.0–34.0)
MCHC: 32.5 g/dL (ref 30.0–36.0)
MCV: 87.6 fL (ref 78.0–100.0)
Platelets: 379 10*3/uL (ref 150–400)
RBC: 3.86 MIL/uL — ABNORMAL LOW (ref 4.22–5.81)
RDW: 12.7 % (ref 11.5–15.5)
WBC: 7.9 10*3/uL (ref 4.0–10.5)

## 2014-02-19 LAB — PROCALCITONIN: PROCALCITONIN: 0.14 ng/mL

## 2014-02-19 LAB — VITAMIN B12: Vitamin B-12: 352 pg/mL (ref 211–911)

## 2014-02-19 LAB — HEPATIC FUNCTION PANEL
ALBUMIN: 3.4 g/dL — AB (ref 3.5–5.2)
ALT: 18 U/L (ref 0–53)
AST: 23 U/L (ref 0–37)
Alkaline Phosphatase: 68 U/L (ref 39–117)
BILIRUBIN DIRECT: 0.2 mg/dL (ref 0.0–0.5)
Indirect Bilirubin: 0.5 mg/dL (ref 0.3–0.9)
TOTAL PROTEIN: 7.3 g/dL (ref 6.0–8.3)
Total Bilirubin: 0.7 mg/dL (ref 0.3–1.2)

## 2014-02-19 LAB — IRON AND TIBC
IRON: 13 ug/dL — AB (ref 42–165)
SATURATION RATIOS: 6 % — AB (ref 20–55)
TIBC: 230 ug/dL (ref 215–435)
UIBC: 217 ug/dL (ref 125–400)

## 2014-02-19 LAB — COMPREHENSIVE METABOLIC PANEL
ALT: 16 U/L (ref 0–53)
AST: 20 U/L (ref 0–37)
Albumin: 3.1 g/dL — ABNORMAL LOW (ref 3.5–5.2)
Alkaline Phosphatase: 61 U/L (ref 39–117)
Anion gap: 8 (ref 5–15)
BUN: 17 mg/dL (ref 6–23)
CO2: 24 mmol/L (ref 19–32)
CREATININE: 0.8 mg/dL (ref 0.50–1.35)
Calcium: 8.3 mg/dL — ABNORMAL LOW (ref 8.4–10.5)
Chloride: 107 mmol/L (ref 96–112)
GFR calc Af Amer: >90 mL/min (ref >90)
GFR calc non Af Amer: >90 mL/min (ref >90)
GLUCOSE: 97 mg/dL (ref 70–99)
POTASSIUM: 3.9 mmol/L (ref 3.5–5.1)
Sodium: 139 mmol/L (ref 135–145)
Total Bilirubin: 0.7 mg/dL (ref 0.3–1.2)
Total Protein: 6.9 g/dL (ref 6.0–8.3)

## 2014-02-19 LAB — TROPONIN I

## 2014-02-19 LAB — RETICULOCYTES
RBC.: 3.63 MIL/uL — AB (ref 4.22–5.81)
Retic Count, Absolute: 43.6 10*3/uL (ref 19.0–186.0)
Retic Ct Pct: 1.2 % (ref 0.4–3.1)

## 2014-02-19 LAB — INFLUENZA PANEL BY PCR (TYPE A & B)
H1N1 flu by pcr: NOT DETECTED
INFLAPCR: NEGATIVE
Influenza B By PCR: NEGATIVE

## 2014-02-19 LAB — CREATININE, SERUM
CREATININE: 0.89 mg/dL (ref 0.50–1.35)
GFR calc Af Amer: >90 mL/min (ref >90)

## 2014-02-19 LAB — D-DIMER, QUANTITATIVE (NOT AT ARMC): D DIMER QUANT: 2.46 ug{FEU}/mL — AB (ref 0.00–0.48)

## 2014-02-19 LAB — FOLATE: Folate: >20 ng/mL

## 2014-02-19 LAB — FERRITIN: Ferritin: 335 ng/mL — ABNORMAL HIGH (ref 22–322)

## 2014-02-19 LAB — EXPECTORATED SPUTUM ASSESSMENT W REFEX TO RESP CULTURE: SPECIAL REQUESTS: NORMAL

## 2014-02-19 MED ORDER — LISINOPRIL 20 MG PO TABS
20.0000 mg | ORAL_TABLET | Freq: Every day | ORAL | Status: DC
  Administered 2014-02-19 – 2014-02-22 (×5): 20 mg via ORAL
  Filled 2014-02-19 (×5): qty 1

## 2014-02-19 MED ORDER — ENSURE COMPLETE PO LIQD
237.0000 mL | Freq: Two times a day (BID) | ORAL | Status: DC
  Administered 2014-02-19 – 2014-02-22 (×7): 237 mL via ORAL

## 2014-02-19 MED ORDER — CEFTRIAXONE SODIUM IN DEXTROSE 20 MG/ML IV SOLN
1.0000 g | INTRAVENOUS | Status: DC
  Administered 2014-02-19 – 2014-02-20 (×2): 1 g via INTRAVENOUS
  Filled 2014-02-19 (×2): qty 50

## 2014-02-19 MED ORDER — GUAIFENESIN-CODEINE 100-10 MG/5ML PO SOLN
5.0000 mL | Freq: Four times a day (QID) | ORAL | Status: DC | PRN
  Administered 2014-02-19 – 2014-02-21 (×8): 5 mL via ORAL
  Filled 2014-02-19 (×9): qty 5

## 2014-02-19 MED ORDER — FESOTERODINE FUMARATE ER 4 MG PO TB24
4.0000 mg | ORAL_TABLET | Freq: Every day | ORAL | Status: DC
  Administered 2014-02-19 – 2014-02-22 (×4): 4 mg via ORAL
  Filled 2014-02-19 (×5): qty 1

## 2014-02-19 MED ORDER — INFLUENZA VAC SPLIT QUAD 0.5 ML IM SUSY
0.5000 mL | PREFILLED_SYRINGE | INTRAMUSCULAR | Status: DC
  Filled 2014-02-19 (×2): qty 0.5

## 2014-02-19 MED ORDER — SERTRALINE HCL 100 MG PO TABS
100.0000 mg | ORAL_TABLET | Freq: Every day | ORAL | Status: DC
  Administered 2014-02-19 – 2014-02-22 (×4): 100 mg via ORAL
  Filled 2014-02-19 (×4): qty 1

## 2014-02-19 MED ORDER — SODIUM CHLORIDE 0.9 % IV SOLN
INTRAVENOUS | Status: DC
  Administered 2014-02-19 – 2014-02-22 (×8): via INTRAVENOUS

## 2014-02-19 MED ORDER — INFLUENZA VAC SPLIT QUAD 0.5 ML IM SUSY
0.5000 mL | PREFILLED_SYRINGE | INTRAMUSCULAR | Status: AC
  Administered 2014-02-20: 0.5 mL via INTRAMUSCULAR
  Filled 2014-02-19 (×2): qty 0.5

## 2014-02-19 MED ORDER — ASPIRIN EC 81 MG PO TBEC
81.0000 mg | DELAYED_RELEASE_TABLET | Freq: Every day | ORAL | Status: DC
  Administered 2014-02-19 – 2014-02-22 (×4): 81 mg via ORAL
  Filled 2014-02-19 (×4): qty 1

## 2014-02-19 MED ORDER — TRAZODONE HCL 50 MG PO TABS
50.0000 mg | ORAL_TABLET | Freq: Every day | ORAL | Status: DC
  Administered 2014-02-19 – 2014-02-21 (×4): 50 mg via ORAL
  Filled 2014-02-19 (×5): qty 1

## 2014-02-19 MED ORDER — ENOXAPARIN SODIUM 40 MG/0.4ML ~~LOC~~ SOLN
40.0000 mg | SUBCUTANEOUS | Status: DC
  Administered 2014-02-19 – 2014-02-22 (×4): 40 mg via SUBCUTANEOUS
  Filled 2014-02-19 (×4): qty 0.4

## 2014-02-19 MED ORDER — PANTOPRAZOLE SODIUM 40 MG PO TBEC
40.0000 mg | DELAYED_RELEASE_TABLET | Freq: Every day | ORAL | Status: DC
  Administered 2014-02-19 – 2014-02-22 (×4): 40 mg via ORAL
  Filled 2014-02-19 (×4): qty 1

## 2014-02-19 MED ORDER — DEXTROSE 5 % IV SOLN
500.0000 mg | INTRAVENOUS | Status: DC
  Administered 2014-02-19 – 2014-02-20 (×2): 500 mg via INTRAVENOUS
  Filled 2014-02-19 (×2): qty 500

## 2014-02-19 NOTE — Progress Notes (Signed)
Assumed care of Phillip Butler at this time. Phillip Butler stable and with no complaints. Will continue to monitor Phillip Butler.   Arta BruceDeutsch, Orest Dygert [facility] HospitalDEBRULER 02/19/2014 3:00 PM

## 2014-02-19 NOTE — Progress Notes (Signed)
Progress Note   Phillip Butler:[id number] DOB: 07-02-1960 DOA: 02/18/2014 PCP: Oliver Barre, MD   Brief Narrative:   Phillip Butler is an 54 y.o. male with a PMH of depression, GERD, hyperlipidemia, anxiety, hypertension and OSA who was admitted 02/18/14 with chief complaint of shortness of breath and cough. Upon initial evaluation in the ED, he was found to be tachycardic with an oxygen saturation of 90%. Chest x-ray showed bilateral pneumonia.  Assessment/Plan:   Principal Problem:   Sepsis secondary to community-acquired pneumonia / acute respiratory failure with hypoxia  Continue Rocephin/azithromycin.  Continue supplemental oxygen as needed.  Follow-up sputum and blood cultures.  Follow-up HIV antibody and influenza panel.  Follow-up strep and legionella urinary antigens.  Hemodynamically stable, discontinue telemetry.  No elevation of lactic acid or Pro calcitonin which is reassuring.  D-dimer elevated, but with alternative explanation for hypoxia, no need to work up further unless patient does not improve over time.  Active Problems:   Normocytic anemia  Drop in hemoglobin overnight likely from dilutional factors. Monitor.   Check anemia panel.    Obstructive sleep apnea  Not on C Pap.    Essential hypertension  Continue lisinopril.    GERD  Continue PPI.    Depression with anxiety  Continue Zoloft and trazodone.    DVT Prophylaxis  Continue Lovenox.   Code Status: Full. Family Communication: No family at the bedside. Disposition Plan: Home when stable.  Lives alone.   IV Access:    Peripheral IV   Procedures and diagnostic studies:   Dg Chest 2 View 02/18/2014: Bilateral lower lobe hazy airspace disease concerning for bibasilar pneumonia.    Medical Consultants:    None.  Anti-Infectives:    Rocephin 02/18/14--->  Azithromycin 02/18/14--->  Subjective:   Phillip Butler denies dyspnea.  Has occasional cough.   Appetite poor.  Fatigued.  +fever/chills subjectively over night.  Objective:    Filed Vitals:   02/18/14 2106 02/18/14 2332 02/19/14 0020 02/19/14 0525  BP: 138/75 156/87 165/89 145/81  Pulse: 114 90 97 93  Temp:   97.5 F (36.4 C) 98.6 F (37 C)  TempSrc:   Oral Oral  Resp: Height:    (1.753 m)   Weight:   110.043 kg (242 lb 9.6 oz)   SpO2: 90% 90% 94% 96%    Intake/Output Summary (Last 24 hours) at 02/19/14 8119 Last data filed at 02/19/14 0700  Gross per 24 hour  Intake 779.17 ml  Output      0 ml  Net 779.17 ml    Exam: Gen:  NAD Cardiovascular:  RRR, No M/R/ Respiratory:  Lungs diminished Gastrointestinal:  Abdomen soft, NT/ND, + BS Extremities:  + edema, hairless   Data Reviewed:    Labs: Basic Metabolic Panel:  Recent Labs Lab 02/18/14 1917 02/19/14 0030 02/19/14 0620  NA 137  --  139  K 4.2  --  3.9  CL 107  --  107  CO2 22  --  24  GLUCOSE 111*  --  97  BUN 17  --  17  CREATININE 0.79 0.89 0.80  CALCIUM 8.4  --  8.3*   GFR Estimated Creatinine Clearance: 129 mL/min (by C-G formula based on Cr of 0.8). Liver Function Tests:  Recent Labs Lab 02/19/14 0030 02/19/14 0620  AST 23 20  ALT 18 16  ALKPHOS 68 61  BILITOT 0.7 0.7  PROT 7.3 6.9  ALBUMIN 3.4*  3.1*   CBC:  Recent Labs Lab 02/18/14 1917 02/19/14 0030 02/19/14 0620  WBC 8.2 7.9 8.4  NEUTROABS 5.8  --  5.6  HGB 12.0* 11.0* 10.4*  HCT 36.0* 33.8* 31.8*  MCV 87.4 87.6 88.6  PLT 410* 379 357   Cardiac Enzymes:  Recent Labs Lab 02/19/14 0030  TROPONINI <0.03   D-Dimer:  Recent Labs  02/18/14 2353  DDIMER 2.46*   Sepsis Labs:  Recent Labs Lab 02/18/14 1917 02/18/14 1929 02/19/14 0012 02/19/14 0030 02/19/14 0620  PROCALCITON  --   --  0.14  --   --   WBC 8.2  --   --  7.9 8.4  LATICACIDVEN  --  0.80  --   --   --    Microbiology No results found for this or any previous visit (from the past 240 hour(s)).   Medications:   .  aspirin EC  81 mg Oral Daily  . azithromycin  500 mg Intravenous Q24H  . cefTRIAXone (ROCEPHIN)  IV  1 g Intravenous Q24H  . enoxaparin (LOVENOX) injection  40 mg Subcutaneous Q24H  . feeding supplement (ENSURE COMPLETE)  237 mL Oral BID BM  . fesoterodine  4 mg Oral Daily  . Influenza vac split quadrivalent PF  0.5 mL Intramuscular Tomorrow-1000  . lisinopril  20 mg Oral Daily  . pantoprazole  40 mg Oral Daily  . sertraline  100 mg Oral Daily  . traZODone  50 mg Oral QHS   Continuous Infusions: . sodium chloride 125 mL/hr at 02/19/14 0800    Time spent: 35 minutes with > 50% of time discussing current diagnostic test results, clinical impression and plan of care.   LOS: 1 day   Tyheim Vanalstyne  [facility] Pager [phone]. If unable to reach me by pager, please call my cell phone at [phone].  *Please refer to [email or web address], password [email or web address] to get updated schedule on who will round on this patient, as hospitalists switch teams weekly. If 7PM-7AM, please contact night-coverage at [email or web address], password [email or web address] for any overnight needs.  02/19/2014, 8:11 AM

## 2014-02-20 DIAGNOSIS — A419 Sepsis, unspecified organism: Secondary | ICD-10-CM | POA: Diagnosis not present

## 2014-02-20 LAB — PROCALCITONIN

## 2014-02-20 LAB — CBC
HCT: 31.9 % — ABNORMAL LOW (ref 39.0–52.0)
Hemoglobin: 10.1 g/dL — ABNORMAL LOW (ref 13.0–17.0)
MCH: 28.2 pg (ref 26.0–34.0)
MCHC: 31.7 g/dL (ref 30.0–36.0)
MCV: 89.1 fL (ref 78.0–100.0)
Platelets: 393 10*3/uL (ref 150–400)
RBC: 3.58 MIL/uL — ABNORMAL LOW (ref 4.22–5.81)
RDW: 12.7 % (ref 11.5–15.5)
WBC: 6.9 10*3/uL (ref 4.0–10.5)

## 2014-02-20 LAB — BASIC METABOLIC PANEL WITH GFR
Anion gap: 7 (ref 5–15)
BUN: 15 mg/dL (ref 6–23)
CO2: 23 mmol/L (ref 19–32)
Calcium: 8 mg/dL — ABNORMAL LOW (ref 8.4–10.5)
Chloride: 106 mmol/L (ref 96–112)
Creatinine, Ser: 0.83 mg/dL (ref 0.50–1.35)
GFR calc Af Amer: >90 mL/min
GFR calc non Af Amer: >90 mL/min
Glucose, Bld: 114 mg/dL — ABNORMAL HIGH (ref 70–99)
Potassium: 3.8 mmol/L (ref 3.5–5.1)
Sodium: 136 mmol/L (ref 135–145)

## 2014-02-20 NOTE — Clinical Social Work Psychosocial (Signed)
Clinical Social Work Department BRIEF PSYCHOSOCIAL ASSESSMENT 02/20/2014  Patient:  Phillip Butler, Phillip Butler     Account Number:  [id number]     Admit date:  02/18/2014  Clinical Social Worker:  Dede Query, CLINICAL SOCIAL WORKER  Date/Time:  02/20/2014 02:53 PM  Referred by:  Physician  Date Referred:  02/20/2014 Referred for  ALF Placement   Other Referral:   Interview type:  Patient Other interview type:    PSYCHOSOCIAL DATA Living Status:  ALONE Admitted from facility:   Level of care:   Primary support name:  Vaughan Basta Primary support relationship to patient:  SIBLING Degree of support available:   medium/sister works full time    CURRENT CONCERNS  Other Concerns:    SOCIAL WORK ASSESSMENT / PLAN CSW met with pt at bedside to assess for services.  CSW prompted pt to discuss history and need for services.  CSW inquired into pt's home and living conditions and any need for services.  CSW discussed ALF options with pt.   Assessment/plan status:  No Further Intervention Required Other assessment/ plan:      Information/referral to community resources:    PATIENT'S/FAMILY'S RESPONSE TO PLAN OF CARE:  Pt stated that he lives alone and cares for himself.  Pt states that he does not work and is on disability. Pt states that he has the support of his sister and his brother in law.  Pt states that he provides for his own needs and does not need any additional services.  Pt declined a need for ALF placement  . Dede Query, LCSW [facility] Clinical Social Worker - Weekend Coverage cell #: [phone]

## 2014-02-20 NOTE — Progress Notes (Signed)
Nutrition Brief Note  Patient identified on the Malnutrition Screening Tool (MST) Report  Pt eating well. Pt with recent weight gain.   Wt Readings from Last 15 Encounters:  02/19/14 242 lb 9.6 oz (110.043 kg)  02/09/13 235 lb 8 oz (106.822 kg)  04/12/12 230 lb (104.327 kg)  03/26/12 242 lb (109.77 kg)  02/03/12 233 lb (105.688 kg)  05/29/11 220 lb 6 oz (99.961 kg)  05/06/11 224 lb (101.606 kg)  04/02/11 225 lb 6 oz (102.229 kg)  11/28/10 236 lb 8 oz (107.276 kg)  08/13/10 253 lb 2 oz (114.817 kg)  03/29/10 259 lb 8 oz (117.708 kg)  09/04/09 262 lb (118.842 kg)  07/17/09 252 lb 8 oz (114.533 kg)  07/05/09 249 lb (112.946 kg)  06/20/09 254 lb (115.214 kg)    Body mass index is 35.81 kg/(m^2). Patient meets criteria for obesity based on current BMI.   Current diet order is regular, patient is consuming approximately 100% of meals at this time. Labs and medications reviewed.   No nutrition interventions warranted at this time. If nutrition issues arise, please consult RD.   Tilda FrancoLindsey Sharolyn Weber, MS, RD, LDN Pager: [phone] After Hours Pager: [phone]

## 2014-02-20 NOTE — Progress Notes (Signed)
Progress Note   Phillip Butler [id number] DOB: August 23, 1960 DOA: 02/18/2014 PCP: Oliver Barre, MD   Brief Narrative:   Phillip Butler is an 54 y.o. male with a PMH of depression, GERD, hyperlipidemia, anxiety, hypertension and OSA who was admitted 02/18/14 with chief complaint of shortness of breath and cough. Upon initial evaluation in the ED, he was found to be tachycardic with an oxygen saturation of 90%. Chest x-ray showed bilateral pneumonia.  Assessment/Plan:   Principal Problem:   Sepsis secondary to community-acquired pneumonia / acute respiratory failure with hypoxia  Continue Rocephin/azithromycin.  Continue supplemental oxygen as needed.  Follow-up sputum and blood cultures.  Follow-up HIV antibody. Influenza panel negative.  Follow-up strep and legionella urinary antigens.  No elevation of lactic acid or Pro calcitonin which is reassuring.  D-dimer elevated, but with alternative explanation for hypoxia, no need to work up further unless patient does not improve over time.  Active Problems:   Normocytic anemia  Drop in hemoglobin overnight likely from dilutional factors. Monitor.   Iron low, ferritin 335. B-12/folate WNL. Findings consistent with anemia of chronic disease.    Obstructive sleep apnea  Not on C Pap.    Essential hypertension  Continue lisinopril.    GERD  Continue PPI.    Depression with anxiety  Continue Zoloft and trazodone.    DVT Prophylaxis  Continue Lovenox.   Code Status: Full. Family Communication: No family at the bedside. Disposition Plan: Home when stable.  Lives alone.   IV Access:    Peripheral IV   Procedures and diagnostic studies:   Dg Chest 2 View 02/18/2014: Bilateral lower lobe hazy airspace disease concerning for bibasilar pneumonia.    Medical Consultants:    None.  Anti-Infectives:    Rocephin 02/18/14--->  Azithromycin 02/18/14--->  Subjective:   Phillip Butler denies dyspnea.   No significant complaints.  Still with fatigue, occasional cough.  Objective:    Filed Vitals:   02/19/14 0920 02/19/14 1316 02/19/14 2020 02/20/14 0448  BP: 137/84 124/75 153/79 161/81  Pulse: 103 92 113 97  Temp: 98.1 F (36.7 C) 98.3 F (36.8 C) 99.5 F (37.5 C) 98.2 F (36.8 C)  TempSrc: Oral Oral Axillary Axillary  Resp: Height:      Weight:      SpO2: 95% 93% 93% 95%    Intake/Output Summary (Last 24 hours) at 02/20/14 1308 Last data filed at 02/20/14 1252  Gross per 24 hour  Intake 4287.5 ml  Output   2425 ml  Net 1862.5 ml    Exam: Gen:  NAD Cardiovascular:  RRR, No M/R/G Respiratory:  Lungs diminished Gastrointestinal:  Abdomen soft, NT/ND, + BS Extremities:  + edema, hairless   Data Reviewed:    Labs: Basic Metabolic Panel:  Recent Labs Lab 02/18/14 1917 02/19/14 0030 02/19/14 0620 02/20/14 0538  NA 137  --  139 136  K 4.2  --  3.9 3.8  CL 107  --  107 106  CO2 22  --  24 23  GLUCOSE 111*  --  97 114*  BUN 17  --  17 15  CREATININE 0.79 0.89 0.80 0.83  CALCIUM 8.4  --  8.3* 8.0*   GFR Estimated Creatinine Clearance: 124.3 mL/min (by C-G formula based on Cr of 0.83). Liver Function Tests:  Recent Labs Lab 02/19/14 0030 02/19/14 0620  AST 23 20  ALT 18 16  ALKPHOS 68 61  BILITOT 0.7 0.7  PROT 7.3 6.9  ALBUMIN 3.4* 3.1*   CBC:  Recent Labs Lab 02/18/14 1917 02/19/14 0030 02/19/14 0620 02/20/14 0538  WBC 8.2 7.9 8.4 6.9  NEUTROABS 5.8  --  5.6  --   HGB 12.0* 11.0* 10.4* 10.1*  HCT 36.0* 33.8* 31.8* 31.9*  MCV 87.4 87.6 88.6 89.1  PLT 410* 379 357 393   Cardiac Enzymes:  Recent Labs Lab 02/19/14 0030  TROPONINI <0.03   D-Dimer:  Recent Labs  02/18/14 2353  DDIMER 2.46*   Sepsis Labs:  Recent Labs Lab 02/18/14 1917 02/18/14 1929 02/19/14 0012 02/19/14 0030 02/19/14 0620 02/20/14 0538  PROCALCITON  --   --  0.14  --   --  <0.10  WBC 8.2  --   --  7.9 8.4 6.9  LATICACIDVEN  --  0.80  --    --   --   --    Microbiology Recent Results (from the past 240 hour(s))  Culture, sputum-assessment     Status: None   Collection Time: 02/19/14  8:02 AM  Result Value Ref Range Status   Specimen Description SPUTUM  Final   Special Requests Normal  Final   Sputum evaluation   Final    THIS SPECIMEN IS ACCEPTABLE. RESPIRATORY CULTURE REPORT TO FOLLOW.   Report Status 02/19/2014 FINAL  Final     Medications:   . aspirin EC  81 mg Oral Daily  . azithromycin  500 mg Intravenous Q24H  . cefTRIAXone (ROCEPHIN)  IV  1 g Intravenous Q24H  . enoxaparin (LOVENOX) injection  40 mg Subcutaneous Q24H  . feeding supplement (ENSURE COMPLETE)  237 mL Oral BID BM  . fesoterodine  4 mg Oral Daily  . lisinopril  20 mg Oral Daily  . pantoprazole  40 mg Oral Daily  . sertraline  100 mg Oral Daily  . traZODone  50 mg Oral QHS   Continuous Infusions: . sodium chloride 125 mL/hr at 02/20/14 0619    Time spent: 25 minutes.   LOS: 2 days   Marilena Trevathan  [facility] Pager [phone]. If unable to reach me by pager, please call my cell phone at [phone].  *Please refer to [email or web address], password [email or web address] to get updated schedule on who will round on this patient, as hospitalists switch teams weekly. If 7PM-7AM, please contact night-coverage at [email or web address], password [email or web address] for any overnight needs.  02/20/2014, 1:08 PM

## 2014-02-21 DIAGNOSIS — A419 Sepsis, unspecified organism: Secondary | ICD-10-CM | POA: Diagnosis not present

## 2014-02-21 LAB — STREP PNEUMONIAE URINARY ANTIGEN: Strep Pneumo Urinary Antigen: NEGATIVE

## 2014-02-21 MED ORDER — CEFUROXIME AXETIL 500 MG PO TABS
500.0000 mg | ORAL_TABLET | Freq: Two times a day (BID) | ORAL | Status: DC
  Administered 2014-02-21 – 2014-02-22 (×2): 500 mg via ORAL
  Filled 2014-02-21 (×3): qty 1

## 2014-02-21 MED ORDER — AZITHROMYCIN 250 MG PO TABS
250.0000 mg | ORAL_TABLET | Freq: Every day | ORAL | Status: DC
  Administered 2014-02-21 – 2014-02-22 (×2): 250 mg via ORAL
  Filled 2014-02-21 (×2): qty 1

## 2014-02-21 MED ORDER — TETANUS-DIPHTH-ACELL PERTUSSIS 5-2.5-18.5 LF-MCG/0.5 IM SUSP
0.5000 mL | Freq: Once | INTRAMUSCULAR | Status: AC
  Administered 2014-02-21: 0.5 mL via INTRAMUSCULAR
  Filled 2014-02-21: qty 0.5

## 2014-02-21 MED ORDER — ACETAMINOPHEN 325 MG PO TABS
650.0000 mg | ORAL_TABLET | ORAL | Status: DC | PRN
  Administered 2014-02-21: 650 mg via ORAL
  Filled 2014-02-21: qty 2

## 2014-02-21 NOTE — Care Management Note (Addendum)
    Page 1 of 2   02/22/2014     10:59:13 AM CARE MANAGEMENT NOTE 02/22/2014  Patient:  Phillip Butler,Phillip Butler   Account Number:  [id number]  Date Initiated:  02/21/2014  Documentation initiated by:  Lanier ClamMAHABIR,Derrill Bagnell  Subjective/Objective Assessment:   54 y/o m admitted w/sepsis.     Action/Plan:   From home.   Anticipated DC Date:  02/22/2014   Anticipated DC Plan:  HOME W HOME HEALTH SERVICES      DC Planning Services  CM consult      Choice offered to / List presented to:  C-1 Patient   DME arranged  OXYGEN      DME agency  [facility]     [facility]  [facility] RN  HH-2 PT  HH-4 NURSE'S AIDE  HH-6 SOCIAL WORKER      HH agency  [facility]   Status of service:  Completed, signed off Medicare Important Message given?   (If response is "NO", the following Medicare IM given date fields will be blank) Date Medicare IM given:   Medicare IM given by:   Date Additional Medicare IM given:   Additional Medicare IM given by:    Discharge Disposition:  HOME W HOME HEALTH SERVICES  Per UR Regulation:  Reviewed for med. necessity/level of care/duration of stay  If discussed at Long Length of Stay Meetings, dates discussed:    Comments:  02/22/14 Lanier ClamKathy Dorell Gatlin RN BSN NCM [phone] [facility] Kristen [facility], aware of d/c, & orders.[facility] dme rep Mayra ReelLecretia aware of qualifying for home 02, & orders.Will deliver home 02 to rm.Nurse notified.Patient correct address is:[address], [place].No further d/c needs.  02/21/14 Lanier ClamKathy Vilda Zollner RN BSN NCM [phone] Can provide private duty agency list as resource.Has medicaid.He can receive HH Nurse's aide for custodial asst.

## 2014-02-21 NOTE — Progress Notes (Signed)
Progress Note   Phillip CongressCharles G Butler [id number] DOB: Oct 24, 1960 DOA: 02/18/2014 PCP: Oliver BarreJames John, MD   Brief Narrative:   Phillip Butler is an 54 y.o. male with a PMH of depression, GERD, hyperlipidemia, anxiety, hypertension and OSA who was admitted 02/18/14 with chief complaint of shortness of breath and cough. Upon initial evaluation in the ED, he was found to be tachycardic with an oxygen saturation of 90%. Chest x-ray showed bilateral pneumonia.  Assessment/Plan:   Principal Problem:   Sepsis secondary to community-acquired pneumonia / acute respiratory failure with hypoxia  Change antibiotics to PO.  Continue supplemental oxygen as needed.  Follow-up sputum and blood cultures.  Follow-up HIV antibody. Influenza panel negative.  Follow-up strep and legionella urinary antigens.  No elevation of lactic acid or Pro calcitonin which is reassuring.  D-dimer elevated, but with alternative explanation for hypoxia, no need to work up further unless patient does not improve over time.  Active Problems:   Normocytic anemia  Drop in hemoglobin overnight likely from dilutional factors. Monitor.   Iron low, ferritin 335. B-12/folate WNL. Findings consistent with anemia of chronic disease.    Obstructive sleep apnea  Not on C Pap.    Essential hypertension  Continue lisinopril.    GERD  Continue PPI.    Depression with anxiety  Continue Zoloft and trazodone.    DVT Prophylaxis  Continue Lovenox.   Code Status: Full. Family Communication: No family at the bedside. Disposition Plan: Home when stable.  Lives alone.   IV Access:    Peripheral IV   Procedures and diagnostic studies:   Dg Chest 2 View 02/18/2014: Bilateral lower lobe hazy airspace disease concerning for bibasilar pneumonia.    Medical Consultants:    None.  Anti-Infectives:    Rocephin 02/18/14--->  Azithromycin 02/18/14--->  Subjective:   Phillip Congressharles G Limburg denies dyspnea.  No  significant complaints.  Still with fatigue, occasional cough, occasional subjective fever and chills.  Appetite is "just fine".  Bowels moved last night.  Objective:    Filed Vitals:   02/20/14 1500 02/20/14 2044 02/21/14 0145 02/21/14 0528  BP: 142/79 163/65 145/67 144/65  Pulse: 96 96 76 75  Temp: 98.4 F (36.9 C) 98.5 F (36.9 C) 98.3 F (36.8 C) 98.2 F (36.8 C)  TempSrc: Axillary Oral Oral Oral  Resp: 20 20 20 19   Height:      Weight:      SpO2: 94% 98% 98% 98%    Intake/Output Summary (Last 24 hours) at 02/21/14 0806 Last data filed at 02/21/14 0700  Gross per 24 hour  Intake 4002.5 ml  Output   2525 ml  Net 1477.5 ml    Exam: Gen:  NAD Cardiovascular:  RRR, No M/R/G Respiratory:  Lungs diminished Gastrointestinal:  Abdomen soft, NT/ND, + BS Extremities:  + edema, hairless   Data Reviewed:    Labs: Basic Metabolic Panel:  Recent Labs Lab 02/18/14 1917 02/19/14 0030 02/19/14 0620 02/20/14 0538  NA 137  --  139 136  K 4.2  --  3.9 3.8  CL 107  --  107 106  CO2 22  --  24 23  GLUCOSE 111*  --  97 114*  BUN 17  --  17 15  CREATININE 0.79 0.89 0.80 0.83  CALCIUM 8.4  --  8.3* 8.0*   GFR Estimated Creatinine Clearance: 124.3 mL/min (by C-G formula based on Cr of 0.83). Liver Function Tests:  Recent Labs Lab 02/19/14 0030 02/19/14 [id number]  AST 23 20  ALT 18 16  ALKPHOS 68 61  BILITOT 0.7 0.7  PROT 7.3 6.9  ALBUMIN 3.4* 3.1*   CBC:  Recent Labs Lab 02/18/14 1917 02/19/14 0030 02/19/14 0620 02/20/14 0538  WBC 8.2 7.9 8.4 6.9  NEUTROABS 5.8  --  5.6  --   HGB 12.0* 11.0* 10.4* 10.1*  HCT 36.0* 33.8* 31.8* 31.9*  MCV 87.4 87.6 88.6 89.1  PLT 410* 379 357 393   Cardiac Enzymes:  Recent Labs Lab 02/19/14 0030  TROPONINI <0.03   D-Dimer:  Recent Labs  02/18/14 2353  DDIMER 2.46*   Sepsis Labs:  Recent Labs Lab 02/18/14 1917 02/18/14 1929 02/19/14 0012 02/19/14 0030 02/19/14 0620 02/20/14 0538  PROCALCITON  --   --   0.14  --   --  <0.10  WBC 8.2  --   --  7.9 8.4 6.9  LATICACIDVEN  --  0.80  --   --   --   --    Microbiology Recent Results (from the past 240 hour(s))  Culture, blood (routine x 2)     Status: None (Preliminary result)   Collection Time: 02/18/14  7:17 PM  Result Value Ref Range Status   Specimen Description BLOOD RIGHT HAND  Final   Special Requests BOTTLES DRAWN AEROBIC AND ANAEROBIC  Final   Culture   Final           BLOOD CULTURE RECEIVED NO GROWTH TO DATE CULTURE WILL BE HELD FOR 5 DAYS BEFORE ISSUING A FINAL NEGATIVE REPORT Performed at [facility]    Report Status PENDING  Incomplete  Culture, blood (routine x 2)     Status: None (Preliminary result)   Collection Time: 02/18/14  7:23 PM  Result Value Ref Range Status   Specimen Description BLOOD LAC  Final   Special Requests BOTTLES DRAWN AEROBIC AND ANAEROBIC  Final   Culture   Final           BLOOD CULTURE RECEIVED NO GROWTH TO DATE CULTURE WILL BE HELD FOR 5 DAYS BEFORE ISSUING A FINAL NEGATIVE REPORT Performed at [facility]    Report Status PENDING  Incomplete  Culture, sputum-assessment     Status: None   Collection Time: 02/19/14  8:02 AM  Result Value Ref Range Status   Specimen Description SPUTUM  Final   Special Requests Normal  Final   Sputum evaluation   Final    THIS SPECIMEN IS ACCEPTABLE. RESPIRATORY CULTURE REPORT TO FOLLOW.   Report Status 02/19/2014 FINAL  Final  Culture, respiratory (NON-Expectorated)     Status: None (Preliminary result)   Collection Time: 02/19/14  8:02 AM  Result Value Ref Range Status   Specimen Description SPUTUM  Final   Special Requests NONE  Final   Gram Stain   Final    ABUNDANT WBC PRESENT, PREDOMINANTLY PMN RARE SQUAMOUS EPITHELIAL CELLS PRESENT ABUNDANT GRAM POSITIVE RODS Performed at [facility]    Culture   Final    Culture reincubated for better growth Performed at [facility]    Report Status PENDING  Incomplete       Medications:   . aspirin EC  81 mg Oral Daily  . azithromycin  500 mg Intravenous Q24H  . cefTRIAXone (ROCEPHIN)  IV  1 g Intravenous Q24H  . enoxaparin (LOVENOX) injection  40 mg Subcutaneous Q24H  . feeding supplement (ENSURE COMPLETE)  237 mL Oral BID BM  . fesoterodine  4 mg  Oral Daily  . lisinopril  20 mg Oral Daily  . pantoprazole  40 mg Oral Daily  . sertraline  100 mg Oral Daily  . traZODone  50 mg Oral QHS   Continuous Infusions: . sodium chloride 125 mL/hr at 02/20/14 1341    Time spent: 25 minutes.   LOS: 3 days   Sharonica Kraszewski  [facility] Pager [phone]. If unable to reach me by pager, please call my cell phone at [phone].  *Please refer to [email or web address], password [email or web address] to get updated schedule on who will round on this patient, as hospitalists switch teams weekly. If 7PM-7AM, please contact night-coverage at [email or web address], password [email or web address] for any overnight needs.  02/21/2014, 8:06 AM

## 2014-02-22 ENCOUNTER — Encounter (HOSPITAL_COMMUNITY): Payer: Self-pay | Admitting: Internal Medicine

## 2014-02-22 LAB — PROCALCITONIN: Procalcitonin: <0.1 ng/mL

## 2014-02-22 LAB — LEGIONELLA ANTIGEN, URINE

## 2014-02-22 LAB — CULTURE, RESPIRATORY: CULTURE: NORMAL

## 2014-02-22 LAB — HIV ANTIBODY (ROUTINE TESTING W REFLEX): HIV Screen 4th Generation wRfx: NONREACTIVE

## 2014-02-22 LAB — CULTURE, RESPIRATORY W GRAM STAIN

## 2014-02-22 MED ORDER — GUAIFENESIN-DM 100-10 MG/5ML PO SYRP: 5.0000 mL | ORAL_SOLUTION | ORAL | Status: DC | PRN

## 2014-02-22 MED ORDER — CEFUROXIME AXETIL 500 MG PO TABS: 500.0000 mg | ORAL_TABLET | Freq: Two times a day (BID) | ORAL | Status: DC

## 2014-02-22 NOTE — Discharge Summary (Signed)
Physician Discharge Summary  [name] MBT:[id number] DOB: 1960/03/12 DOA: 02/18/2014  PCP: Cathlean Cower, MD  Admit date: 02/18/2014 Discharge date: 02/22/2014   Recommendations for Outpatient Follow-Up:   1. Home health RN, PT, Aide, SW set up. 2. DME: Home oxygen set up.  Please re-assess for ongoing need at post discharge F/U scheduled at 03/02/14 at 3:30PM.  Although hypoxia thought to be from pneumonia, D-dimer was mildly elevated so consider work up for pulmonary embolism if hypoxia does not resolve. 3. F/U final blood cultures, they are negative to date. 4. Consider outpatient referral to pulmonology for a sleep study/CPAP initiation.   Discharge Diagnosis:   Principal Problem:    Sepsis secondary to CAP Active Problems:    Obstructive sleep apnea    Essential hypertension    GERD    Community acquired pneumonia    Depression with anxiety    Acute respiratory failure with hypoxia    Normocytic anemia   Discharge Condition: Improved.  Diet recommendation: Low sodium, heart healthy.     History of Present Illness:   Phillip Butler is an 54 y.o. male with a PMH of depression, GERD, hyperlipidemia, anxiety, hypertension and OSA who was admitted 02/18/14 with chief complaint of shortness of breath and cough. Upon initial evaluation in the ED, he was found to be tachycardic with an oxygen saturation of 90%. Chest x-ray showed bilateral pneumonia.  Hospital Course by Problem:   Principal Problem:  Sepsis secondary to community-acquired pneumonia / acute respiratory failure with hypoxia  Discharge home on oral Ceftin for 2 more days to complete 7 days of therapy.  Has had 5 days of Azithromycin.  Continue supplemental oxygen.  Have set up home oxygen therapy.  Follow-up blood cultures.  Sputum culture negative.  HIV antibody negative. Influenza panel negative. Strep pneumoniae antigen negative.  Follow-up legionella urinary antigen.  D-dimer elevated,  but with alternative explanation for hypoxia, no need to work up further unless patient does not improve over time.  Active Problems:  Normocytic anemia / anemia of chronic disease  Drop in hemoglobin overnight likely from dilutional factors.   Iron low, ferritin 335. B-12/folate WNL. Findings consistent with anemia of chronic disease.   Obstructive sleep apnea  Not on C Pap.   Essential hypertension  Continue lisinopril.   GERD  Continue PPI.   Depression with anxiety  Continue Zoloft and trazodone.    Medical Consultants:    None.   Discharge Exam:   Filed Vitals:   02/22/14 0452  BP: 163/98  Pulse: 107  Temp: 98.9 F (37.2 C)  Resp: 22   Filed Vitals:   02/21/14 1410 02/21/14 2208 02/22/14 0136 02/22/14 0452  BP: 134/80 163/91 145/69 163/98  Pulse: 100 126 92 107  Temp: 98.5 F (36.9 C) 98.5 F (36.9 C) 98.1 F (36.7 C) 98.9 F (37.2 C)  TempSrc: Oral Oral Oral Oral  Resp: '20 22 22 22  ' Height:      Weight:      SpO2: 93% 92% 96% 93%    Gen:  NAD Cardiovascular:  RRR, No M/R/G Respiratory: Lungs CTAB Gastrointestinal: Abdomen soft, NT/ND with normal active bowel sounds. Extremities: No C/E/C   The results of significant diagnostics from this hospitalization (including imaging, microbiology, ancillary and laboratory) are listed below for reference.     Procedures and Diagnostic Studies:   Dg Chest 2 View 02/18/2014: Bilateral lower lobe hazy airspace disease concerning for bibasilar pneumonia.   Labs:   Basic  Metabolic Panel:  Recent Labs Lab 02/18/14 1917 02/19/14 0030 02/19/14 0620 02/20/14 0538  NA 137  --  139 136  K 4.2  --  3.9 3.8  CL 107  --  107 106  CO2 22  --  24 23  GLUCOSE 111*  --  97 114*  BUN 17  --  17 15  CREATININE 0.79 0.89 0.80 0.83  CALCIUM 8.4  --  8.3* 8.0*   GFR Estimated Creatinine Clearance: 124.3 mL/min (by C-G formula based on Cr of 0.83). Liver Function Tests:  Recent Labs Lab  02/19/14 0030 02/19/14 0620  AST 23 20  ALT 18 16  ALKPHOS 68 61  BILITOT 0.7 0.7  PROT 7.3 6.9  ALBUMIN 3.4* 3.1*   CBC:  Recent Labs Lab 02/18/14 1917 02/19/14 0030 02/19/14 0620 02/20/14 0538  WBC 8.2 7.9 8.4 6.9  NEUTROABS 5.8  --  5.6  --   HGB 12.0* 11.0* 10.4* 10.1*  HCT 36.0* 33.8* 31.8* 31.9*  MCV 87.4 87.6 88.6 89.1  PLT 410* 379 357 393   Cardiac Enzymes:  Recent Labs Lab 02/19/14 0030  TROPONINI <0.03   Microbiology Recent Results (from the past 240 hour(s))  Culture, blood (routine x 2)     Status: None (Preliminary result)   Collection Time: 02/18/14  7:17 PM  Result Value Ref Range Status   Specimen Description BLOOD RIGHT HAND  Final   Special Requests BOTTLES DRAWN AEROBIC AND ANAEROBIC 4ML  Final   Culture   Final           BLOOD CULTURE RECEIVED NO GROWTH TO DATE CULTURE WILL BE HELD FOR 5 DAYS BEFORE ISSUING A FINAL NEGATIVE REPORT Performed at [facility]    Report Status PENDING  Incomplete  Culture, blood (routine x 2)     Status: None (Preliminary result)   Collection Time: 02/18/14  7:23 PM  Result Value Ref Range Status   Specimen Description BLOOD LAC  Final   Special Requests BOTTLES DRAWN AEROBIC AND ANAEROBIC 5ML  Final   Culture   Final           BLOOD CULTURE RECEIVED NO GROWTH TO DATE CULTURE WILL BE HELD FOR 5 DAYS BEFORE ISSUING A FINAL NEGATIVE REPORT Performed at [facility]    Report Status PENDING  Incomplete  Culture, sputum-assessment     Status: None   Collection Time: 02/19/14  8:02 AM  Result Value Ref Range Status   Specimen Description SPUTUM  Final   Special Requests Normal  Final   Sputum evaluation   Final    THIS SPECIMEN IS ACCEPTABLE. RESPIRATORY CULTURE REPORT TO FOLLOW.   Report Status 02/19/2014 FINAL  Final  Culture, respiratory (NON-Expectorated)     Status: None   Collection Time: 02/19/14  8:02 AM  Result Value Ref Range Status   Specimen Description SPUTUM  Final   Special  Requests NONE  Final   Gram Stain   Final    ABUNDANT WBC PRESENT, PREDOMINANTLY PMN RARE SQUAMOUS EPITHELIAL CELLS PRESENT ABUNDANT GRAM POSITIVE RODS Performed at [facility]    Culture   Final    NORMAL OROPHARYNGEAL FLORA Performed at [facility]    Report Status 02/22/2014 FINAL  Final     Discharge Instructions:   Discharge Instructions    Call MD for:  difficulty breathing, headache or visual disturbances    Complete by:  As directed      Call MD for:  extreme fatigue    Complete by:  As directed      Call MD for:  temperature >100.4    Complete by:  As directed      Diet - low sodium heart healthy    Complete by:  As directed      Discharge instructions    Complete by:  As directed   You were cared for by Dr. Jacquelynn Cree  (a hospitalist) during your hospital stay. If you have any questions about your discharge medications or the care you received while you were in the hospital after you are discharged, you can call the unit and ask to speak with the hospitalist on call if the hospitalist that took care of you is not available. Once you are discharged, your primary care physician will handle any further medical issues. Please note that NO REFILLS for any discharge medications will be authorized once you are discharged, as it is imperative that you return to your primary care physician (or establish a relationship with a primary care physician if you do not have one) for your aftercare needs so that they can reassess your need for medications and monitor your lab values.  Any outstanding tests can be reviewed by your PCP at your follow up visit.  It is also important to review any medicine changes with your PCP.  Please bring these d/c instructions with you to your next visit so your physician can review these changes with you.  If you do not have a primary care physician, you can call [phone] for a physician referral.  It is highly recommended that you obtain  a PCP for hospital follow up.  You were treated for pneumonia while you were in the hospital.  It is important that you see your PCP in follow up, and have him/her order a follow up chest x-ray in 4-6 weeks to ensure resolution of the pneumonia and to exclude any underlying pathology.  Take all of your antibiotics, as prescribed, even if you feel better.  Do not discontinue antibiotics prematurely.     Face-to-face encounter (required for Medicare/Medicaid patients)    Complete by:  As directed   I Raye Slyter certify that this patient is under my care and that I, or a nurse practitioner or physician's assistant working with me, had a face-to-face encounter that meets the physician face-to-face encounter requirements with this patient on 02/22/2014. The encounter with the patient was in whole, or in part for the following medical condition(s) which is the primary reason for home health care (List medical condition): Patient seems to have some cognitive deficits.  Needs RN to ensure he understands how to take his medications, PT/Aide for assistance with ADLs, home adaptation, SW to evaluate home to ensure it is a safe environment and that his needs are being met.  The encounter with the patient was in whole, or in part, for the following medical condition, which is the primary reason for home health care:  CAP, deconditioning.  I certify that, based on my findings, the following services are medically necessary home health services:  Physical therapy  Reason for Medically Necessary Home Health Services:   Therapy- Therapeutic Exercises to Increase Strength and Endurance Skilled Nursing- Change/Decline in Patient Status    My clinical findings support the need for the above services:  Unable to leave home safely without assistance and/or assistive device  Further, I certify that my clinical findings support that this patient is homebound due to:  Unable to  leave home safely without assistance     For  home use only DME oxygen    Complete by:  As directed   Mode or (Route):  Nasal cannula  Liters per Minute:  2  Frequency:  Continuous (stationary and portable oxygen unit needed)  Oxygen conserving device:  Yes  Oxygen delivery system:  Gas     Home Health    Complete by:  As directed   To provide the following care/treatments:   [place] RN Social work       Increase activity slowly    Complete by:  As directed             Medication List    STOP taking these medications        azithromycin 250 MG tablet  Commonly known as:  ZITHROMAX Z-PAK     TUMS 500 MG chewable tablet  Generic drug:  calcium carbonate      TAKE these medications        aspirin 81 MG EC tablet  Take 1 tablet (81 mg total) by mouth daily.     cefUROXime 500 MG tablet  Commonly known as:  CEFTIN  Take 1 tablet (500 mg total) by mouth 2 (two) times daily with a meal.     fesoterodine 4 MG Tb24 tablet  Commonly known as:  TOVIAZ  Take 1 tablet (4 mg total) by mouth daily.     guaiFENesin-dextromethorphan 100-10 MG/5ML syrup  Commonly known as:  ROBITUSSIN DM  Take 5 mLs by mouth every 4 (four) hours as needed for cough.     lisinopril 20 MG tablet  Commonly known as:  PRINIVIL,ZESTRIL  TAKE 1 TABLET BY MOUTH EVERY DAY     omeprazole 20 MG capsule  Commonly known as:  PRILOSEC  Take 1 capsule (20 mg total) by mouth daily.     sertraline 100 MG tablet  Commonly known as:  ZOLOFT  Take 1 tablet (100 mg total) by mouth daily.     traZODone 50 MG tablet  Commonly known as:  DESYREL  TAKE 1 TABLET BY MOUTH AT BEDTIME           Follow-up Information    Follow up with Cathlean Cower, MD.   Specialties:  Internal Medicine, Radiology   Contact information:   [place] [address] [phone]       Follow up with [facility].   Why:  HHRN/PT/Nurse's aide/social worker   Contact information:   [address] [place]  [address] [phone]       Follow up with [facility].   Why:  0xygen   Contact information:   [address] [place] [address] [phone]        Time coordinating discharge: 35 minutes.  Signed:  Vishruth Seoane  Pager [phone] [facility] 02/22/2014, 12:54 PM

## 2014-02-22 NOTE — Discharge Instructions (Signed)
Pneumonia, Adult  Pneumonia is an infection of the lungs. It may be caused by a germ (virus or bacteria). Some types of pneumonia can spread easily from person to person. This can happen when you cough or sneeze.  HOME CARE   Only take medicine as told by your doctor.   Take your medicine (antibiotics) as told. Finish it even if you start to feel better.   Do not smoke.   You may use a vaporizer or humidifier in your room. This can help loosen thick spit (mucus).   Sleep so you are almost sitting up (semi-upright). This helps reduce coughing.   Rest.  A shot (vaccine) can help prevent pneumonia. Shots are often advised for:   People over 65 years old.   Patients on chemotherapy.   People with long-term (chronic) lung problems.   People with immune system problems.  GET HELP RIGHT AWAY IF:    You are getting worse.   You cannot control your cough, and you are losing sleep.   You cough up blood.   Your pain gets worse, even with medicine.   You have a fever.   Any of your problems are getting worse, not better.   You have shortness of breath or chest pain.  MAKE SURE YOU:    Understand these instructions.   Will watch your condition.   Will get help right away if you are not doing well or get worse.  Document Released: 06/26/2007 Document Revised: 04/01/2011 Document Reviewed: 03/30/2010  ExitCare Patient Information 2015 [employer]. This information is not intended to replace advice given to you by your health care provider. Make sure you discuss any questions you have with your health care provider.

## 2014-02-22 NOTE — Progress Notes (Signed)
SATURATION QUALIFICATIONS: (This note is used to comply with regulatory documentation for home oxygen)  Patient Saturations on Room Air at Rest = 91%  Patient Saturations on Room Air while Ambulating = 87%  Patient Saturations on 2 Liters of oxygen while Ambulating = 93%  Please briefly explain why patient needs home oxygen: When ambulating patients oxygen saturation drops without any oxygen.

## 2014-02-22 NOTE — Evaluation (Signed)
Physical Therapy Evaluation Patient Details Name: Phillip Butler MRN: [id number] DOB: 1961-01-20 Today's Date: 02/22/2014   History of Present Illness  54 y.o. male with a PMH of depression, GERD, hyperlipidemia, anxiety, hypertension and OSA who was admitted 02/18/14 for sepsis secondary to community-acquired pneumonia / acute respiratory failure with hypoxia  Clinical Impression  Pt admitted with above diagnosis. Pt currently with functional limitations due to the deficits listed below (see PT Problem List). Pt slow to respond at times and reports living alone.  Pt requiring supplemental oxygen at this time so recommend initial supervision with mobility at first as pt will require oxygen line.  Pt will benefit from skilled PT to increase their independence and safety with mobility to allow discharge to the venue listed below.    SATURATION QUALIFICATIONS: (This note is used to comply with regulatory documentation for home oxygen)  Patient Saturations on Room Air at Rest = 90%  Patient Saturations on Room Air while Ambulating = 87%  Patient Saturations on 2 Liters of oxygen while Ambulating = 90%  Please briefly explain why patient needs home oxygen: to maintain oxygen saturation above 88% during functional activities such as ambulation.     Follow Up Recommendations Home health PT;Supervision for mobility/OOB    Equipment Recommendations  None recommended by PT    Recommendations for Other Services       Precautions / Restrictions Precautions Precautions: Fall Precaution Comments: monitor sats      Mobility  Bed Mobility Overal bed mobility: Modified Independent                Transfers Overall transfer level: Needs assistance Equipment used: None Transfers: Sit to/from Stand Sit to Stand: Supervision            Ambulation/Gait Ambulation/Gait assistance: Supervision Ambulation Distance (Feet): 160 Feet Assistive device: None Gait Pattern/deviations:  Step-through pattern Gait velocity: decr   General Gait Details: slow pace, reports mild SOB, required 2L O2 [place]  Stairs            Wheelchair Mobility    Modified Rankin (Stroke Patients Only)       Balance                                             Pertinent Vitals/Pain Pain Assessment: No/denies pain    Home Living Family/patient expects to be discharged to:: Private residence Living Arrangements: Alone           Home Layout: Able to live on main level with bedroom/bathroom Home Equipment: None Additional Comments: pt reports previous fall on stairs and he "isn't allowed to use them anymore"    Prior Function Level of Independence: Independent               Hand Dominance        Extremity/Trunk Assessment               Lower Extremity Assessment: Overall WFL for tasks assessed         Communication   Communication: No difficulties  Cognition Arousal/Alertness: Awake/alert Behavior During Therapy: WFL for tasks assessed/performed Overall Cognitive Status: Within Functional Limits for tasks assessed                      General Comments      Exercises        Assessment/Plan  PT Assessment Patient needs continued PT services  PT Diagnosis Difficulty walking   PT Problem List Decreased mobility;Decreased activity tolerance;Cardiopulmonary status limiting activity  PT Treatment Interventions DME instruction;Gait training;Functional mobility training;Patient/family education;Therapeutic activities;Therapeutic exercise   PT Goals (Current goals can be found in the Care Plan section) Acute Rehab PT Goals PT Goal Formulation: With patient Time For Goal Achievement: 03/01/14 Potential to Achieve Goals: Good    Frequency Min 3X/week   Barriers to discharge        Co-evaluation               End of Session Equipment Utilized During Treatment: Oxygen Activity Tolerance: Patient tolerated  treatment well Patient left: in bed;with call bell/phone within reach;with bed alarm set           Time: [id number] PT Time Calculation (min) (ACUTE ONLY): 11 min   Charges:   PT Evaluation $Initial PT Evaluation Tier I: 1 Procedure     PT G Codes:        Barbee Mamula,KATHrine E 02/22/2014, 9:43 AM Zenovia JarredKati Kash Davie, PT, DPT 02/22/2014 Pager: [phone]

## 2014-02-23 ENCOUNTER — Other Ambulatory Visit: Payer: Self-pay | Admitting: Internal Medicine

## 2014-02-23 DIAGNOSIS — G4733 Obstructive sleep apnea (adult) (pediatric): Secondary | ICD-10-CM | POA: Diagnosis not present

## 2014-02-23 DIAGNOSIS — F341 Dysthymic disorder: Secondary | ICD-10-CM | POA: Diagnosis not present

## 2014-02-23 DIAGNOSIS — J189 Pneumonia, unspecified organism: Secondary | ICD-10-CM | POA: Diagnosis not present

## 2014-02-23 DIAGNOSIS — I10 Essential (primary) hypertension: Secondary | ICD-10-CM | POA: Diagnosis not present

## 2014-02-23 NOTE — Telephone Encounter (Signed)
Diane called in [facility] and said that pharmacy will be faxing over request for refills on some of pt meds

## 2014-02-25 ENCOUNTER — Telehealth: Payer: Self-pay | Admitting: Internal Medicine

## 2014-02-25 DIAGNOSIS — J189 Pneumonia, unspecified organism: Secondary | ICD-10-CM | POA: Diagnosis not present

## 2014-02-25 DIAGNOSIS — G4733 Obstructive sleep apnea (adult) (pediatric): Secondary | ICD-10-CM | POA: Diagnosis not present

## 2014-02-25 DIAGNOSIS — F341 Dysthymic disorder: Secondary | ICD-10-CM | POA: Diagnosis not present

## 2014-02-25 DIAGNOSIS — I10 Essential (primary) hypertension: Secondary | ICD-10-CM | POA: Diagnosis not present

## 2014-02-25 LAB — CULTURE, BLOOD (ROUTINE X 2)
CULTURE: NO GROWTH
CULTURE: NO GROWTH

## 2014-02-25 NOTE — Telephone Encounter (Signed)
Phillip Butler [phone]  From [facility] called in said that pt was not compliant with visit today.  She called to make appt and he was not there.  [facility]

## 2014-02-28 DIAGNOSIS — F341 Dysthymic disorder: Secondary | ICD-10-CM | POA: Diagnosis not present

## 2014-02-28 DIAGNOSIS — G4733 Obstructive sleep apnea (adult) (pediatric): Secondary | ICD-10-CM | POA: Diagnosis not present

## 2014-02-28 DIAGNOSIS — I10 Essential (primary) hypertension: Secondary | ICD-10-CM | POA: Diagnosis not present

## 2014-02-28 DIAGNOSIS — J189 Pneumonia, unspecified organism: Secondary | ICD-10-CM | POA: Diagnosis not present

## 2014-03-01 DIAGNOSIS — J189 Pneumonia, unspecified organism: Secondary | ICD-10-CM | POA: Diagnosis not present

## 2014-03-02 ENCOUNTER — Inpatient Hospital Stay: Payer: Medicaid Other | Admitting: Internal Medicine

## 2014-03-03 ENCOUNTER — Ambulatory Visit (INDEPENDENT_AMBULATORY_CARE_PROVIDER_SITE_OTHER): Payer: Medicare Other | Admitting: Internal Medicine

## 2014-03-03 ENCOUNTER — Ambulatory Visit (INDEPENDENT_AMBULATORY_CARE_PROVIDER_SITE_OTHER)
Admission: RE | Admit: 2014-03-03 | Discharge: 2014-03-03 | Disposition: A | Discharge status: Home / Self Care | Payer: Medicaid Other | Source: Ambulatory Visit | Attending: Internal Medicine | Admitting: Internal Medicine

## 2014-03-03 ENCOUNTER — Encounter: Payer: Self-pay | Admitting: Internal Medicine

## 2014-03-03 VITALS — BP 154/98 | HR 98 | Temp 98.9°F | Ht 69.0 in | Wt 237.0 lb

## 2014-03-03 DIAGNOSIS — I10 Essential (primary) hypertension: Secondary | ICD-10-CM | POA: Diagnosis not present

## 2014-03-03 DIAGNOSIS — J189 Pneumonia, unspecified organism: Secondary | ICD-10-CM

## 2014-03-03 DIAGNOSIS — F341 Dysthymic disorder: Secondary | ICD-10-CM | POA: Diagnosis not present

## 2014-03-03 DIAGNOSIS — G4733 Obstructive sleep apnea (adult) (pediatric): Secondary | ICD-10-CM

## 2014-03-03 DIAGNOSIS — J9601 Acute respiratory failure with hypoxia: Secondary | ICD-10-CM

## 2014-03-03 MED ORDER — FESOTERODINE FUMARATE ER 4 MG PO TB24: 4.0000 mg | ORAL_TABLET | Freq: Every day | ORAL | Status: DC

## 2014-03-03 MED ORDER — LISINOPRIL 20 MG PO TABS: 20.0000 mg | ORAL_TABLET | Freq: Every day | ORAL | Status: DC

## 2014-03-03 MED ORDER — SERTRALINE HCL 100 MG PO TABS: 100.0000 mg | ORAL_TABLET | Freq: Every day | ORAL | Status: DC

## 2014-03-03 MED ORDER — OMEPRAZOLE 20 MG PO CPDR: 20.0000 mg | DELAYED_RELEASE_CAPSULE | Freq: Every day | ORAL | Status: DC

## 2014-03-03 NOTE — Assessment & Plan Note (Signed)
Also for referral to pulm for f/u eval and tx

## 2014-03-03 NOTE — Progress Notes (Signed)
Subjective:    Patient ID: Phillip Butler, male    DOB: 1960/08/22, 54 y.o.   MRN: [id number]  HPI   Here to f/u, overall improved having been compliant and finished antibx for bibas pna/sepsis jan 29; still with deep scant prod cough, no blood, no wheezing or worsening sob/doe, but does have diffuse sharp ant fleeting CP worse after cough.  ? With fever but not sure.  Also with acute hypoxic resp fail sent home with Home o2, has not worn for a day or two at home, o2 sat here 95% rest, then 95% post ambulation on RA.  Recent sputum cx not helpful. 1/29 blood cx no grownth.  No new complaints.  + some suggestion at hospn for need for OSA eval with pulm. Pt is aggreable.   Past Medical History  Diagnosis Date  . Depression   . GERD (gastroesophageal reflux disease)   . Hyperlipidemia   . Anxiety   . Hypertension   . Allergic rhinitis   . OSA (obstructive sleep apnea)   . 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 Anemia of chronic disease    No past surgical history on file.  reports that he has never smoked. He does not have any smokeless tobacco history on file. He reports that he does not drink alcohol or use illicit drugs. family history includes Alcohol abuse in an other family member; Arthritis in an other family member; Breast cancer in an other family member; Diabetes in an other family member; Heart disease in his father and another family member; Hyperlipidemia in an other family member; Kidney disease in an other family member; Pancreatic cancer in an other family member; Prostate cancer in an other family member; Stroke in an other family member. Allergies  Allergen Reactions  . Fluoxetine Hcl     REACTION: ineffective   Current Outpatient Prescriptions on File Prior to Visit  Medication Sig Dispense Refill  . aspirin 81 MG EC tablet Take 1 tablet (81 mg total) by mouth daily. 30 tablet 8  . guaiFENesin-dextromethorphan (ROBITUSSIN DM) 100-10 MG/5ML syrup Take 5 mLs by mouth every  4 (four) hours as needed for cough. 118 mL 0  . traZODone (DESYREL) 50 MG tablet TAKE 1 TABLET BY MOUTH AT BEDTIME 90 tablet 1   No current facility-administered medications on file prior to visit.   Review of Systems  Constitutional: Negative for unusual diaphoresis or other sweats  HENT: Negative for ringing in ear Eyes: Negative for double vision or worsening visual disturbance.  Respiratory: Negative for choking and stridor.   Gastrointestinal: Negative for vomiting or other signifcant bowel change Genitourinary: Negative for hematuria or decreased urine volume.  Musculoskeletal: Negative for other MSK pain or swelling Skin: Negative for color change and worsening wound.  Neurological: Negative for tremors and numbness other than noted  Psychiatric/Behavioral: Negative for decreased concentration or agitation other than above       Objective:   Physical Exam BP 154/98 mmHg  Pulse 98  Temp(Src) 98.9 F (37.2 C) (Oral)  Ht  (1.753 m)  Wt 237 lb (107.502 kg)  BMI 34.98 kg/m2 VS noted,  Constitutional: Pt appears well-developed, well-nourished.  HENT: Head: NCAT.  Right Ear: External ear normal.  Left Ear: External ear normal.  Eyes: . Pupils are equal, round, and reactive to light. Conjunctivae and EOM are normal Neck: Normal range of motion. Neck supple.  Cardiovascular: Normal rate and regular rhythm.   Pulmonary/Chest: Effort normal and breath sounds somewhat decreased  bilat without rales or wheezing.  Abd:  Soft, NT, ND, + BS Neurological: Pt is alert. At baseline confused , motor grossly intact Skin: Skin is warm. No rash Psychiatric: Pt behavior is normal. No agitation.     Assessment & Plan:

## 2014-03-03 NOTE — Patient Instructions (Addendum)
OK to stop the Home oxygen  We will fax order to [facility]  Please continue all other medications as before, and refills have been done if requested.  Please have the pharmacy call with any other refills you may need.  Please keep your appointments with your specialists as you may have planned  Please go to the XRAY Department in the Basement (go straight as you get off the elevator) for the x-ray testing  You will be contacted by phone if any changes need to be made immediately.  Otherwise, you will receive a letter about your results with an explanation, but please check with MyChart first.  You will be contacted regarding the referral for: pulmonary for sleep apnea  Please remember to sign up for MyChart if you have not done so, as this will be important to you in the future with finding out test results, communicating by private email, and scheduling acute appointments online when needed.  Please return in 6 months, or sooner if needed, with Lab testing done 3-5 days before

## 2014-03-03 NOTE — Progress Notes (Signed)
Pre visit review using our clinic review tool, if applicable. No additional management support is needed unless otherwise documented below in the visit note. 

## 2014-03-03 NOTE — Assessment & Plan Note (Signed)
Resolved, ok to d/c Home O2

## 2014-03-03 NOTE — Assessment & Plan Note (Signed)
Mild elev today, o/w stable overall by history and exam, recent data reviewed with pt, and pt to continue medical treatment as before as pt does not accept further change at this time,  to f/u any worsening symptoms or concerns BP Readings from Last 3 Encounters:  03/03/14 154/98  02/22/14 163/98  02/09/13 122/78

## 2014-03-03 NOTE — Assessment & Plan Note (Signed)
S/p inpatient tx, now finished antibx, for f/u cxr as pt c/o persistnet cough he is concerned is getting worse

## 2014-03-09 DIAGNOSIS — I10 Essential (primary) hypertension: Secondary | ICD-10-CM | POA: Diagnosis not present

## 2014-03-09 DIAGNOSIS — J189 Pneumonia, unspecified organism: Secondary | ICD-10-CM | POA: Diagnosis not present

## 2014-03-09 DIAGNOSIS — G4733 Obstructive sleep apnea (adult) (pediatric): Secondary | ICD-10-CM | POA: Diagnosis not present

## 2014-03-09 DIAGNOSIS — F341 Dysthymic disorder: Secondary | ICD-10-CM | POA: Diagnosis not present

## 2014-03-11 DIAGNOSIS — J189 Pneumonia, unspecified organism: Secondary | ICD-10-CM | POA: Diagnosis not present

## 2014-05-30 ENCOUNTER — Ambulatory Visit: Payer: Medicaid Other | Admitting: Pulmonary Disease

## 2015-02-26 ENCOUNTER — Emergency Department (HOSPITAL_COMMUNITY): Payer: Medicare Other

## 2015-02-26 ENCOUNTER — Inpatient Hospital Stay (HOSPITAL_COMMUNITY)
Admission: EM | Admit: 2015-02-26 | Discharge: 2015-03-06 | DRG: 492 | Disposition: A | Discharge status: Skilled Nursing Facility | Payer: Medicare Other | Attending: Orthopedic Surgery | Admitting: Orthopedic Surgery

## 2015-02-26 ENCOUNTER — Encounter (HOSPITAL_COMMUNITY): Payer: Self-pay | Admitting: Emergency Medicine

## 2015-02-26 DIAGNOSIS — I824Z9 Acute embolism and thrombosis of unspecified deep veins of unspecified distal lower extremity: Secondary | ICD-10-CM | POA: Diagnosis present

## 2015-02-26 DIAGNOSIS — E785 Hyperlipidemia, unspecified: Secondary | ICD-10-CM | POA: Diagnosis present

## 2015-02-26 DIAGNOSIS — H9193 Unspecified hearing loss, bilateral: Secondary | ICD-10-CM | POA: Diagnosis not present

## 2015-02-26 DIAGNOSIS — J9601 Acute respiratory failure with hypoxia: Secondary | ICD-10-CM | POA: Diagnosis not present

## 2015-02-26 DIAGNOSIS — K0889 Other specified disorders of teeth and supporting structures: Secondary | ICD-10-CM | POA: Diagnosis present

## 2015-02-26 DIAGNOSIS — K219 Gastro-esophageal reflux disease without esophagitis: Secondary | ICD-10-CM | POA: Diagnosis present

## 2015-02-26 DIAGNOSIS — G4733 Obstructive sleep apnea (adult) (pediatric): Secondary | ICD-10-CM | POA: Diagnosis present

## 2015-02-26 DIAGNOSIS — J989 Respiratory disorder, unspecified: Secondary | ICD-10-CM | POA: Diagnosis not present

## 2015-02-26 DIAGNOSIS — Z4789 Encounter for other orthopedic aftercare: Secondary | ICD-10-CM | POA: Diagnosis not present

## 2015-02-26 DIAGNOSIS — I82442 Acute embolism and thrombosis of left tibial vein: Secondary | ICD-10-CM | POA: Diagnosis present

## 2015-02-26 DIAGNOSIS — M79662 Pain in left lower leg: Secondary | ICD-10-CM | POA: Diagnosis not present

## 2015-02-26 DIAGNOSIS — I5033 Acute on chronic diastolic (congestive) heart failure: Secondary | ICD-10-CM | POA: Diagnosis present

## 2015-02-26 DIAGNOSIS — D638 Anemia in other chronic diseases classified elsewhere: Secondary | ICD-10-CM | POA: Diagnosis present

## 2015-02-26 DIAGNOSIS — Y9301 Activity, walking, marching and hiking: Secondary | ICD-10-CM | POA: Diagnosis present

## 2015-02-26 DIAGNOSIS — Z7982 Long term (current) use of aspirin: Secondary | ICD-10-CM | POA: Diagnosis not present

## 2015-02-26 DIAGNOSIS — M6281 Muscle weakness (generalized): Secondary | ICD-10-CM | POA: Diagnosis not present

## 2015-02-26 DIAGNOSIS — F329 Major depressive disorder, single episode, unspecified: Secondary | ICD-10-CM | POA: Diagnosis present

## 2015-02-26 DIAGNOSIS — S82852A Displaced trimalleolar fracture of left lower leg, initial encounter for closed fracture: Secondary | ICD-10-CM | POA: Diagnosis not present

## 2015-02-26 DIAGNOSIS — E669 Obesity, unspecified: Secondary | ICD-10-CM | POA: Diagnosis present

## 2015-02-26 DIAGNOSIS — F840 Autistic disorder: Secondary | ICD-10-CM | POA: Diagnosis present

## 2015-02-26 DIAGNOSIS — F319 Bipolar disorder, unspecified: Secondary | ICD-10-CM | POA: Diagnosis present

## 2015-02-26 DIAGNOSIS — R0600 Dyspnea, unspecified: Secondary | ICD-10-CM | POA: Diagnosis not present

## 2015-02-26 DIAGNOSIS — I5032 Chronic diastolic (congestive) heart failure: Secondary | ICD-10-CM | POA: Diagnosis not present

## 2015-02-26 DIAGNOSIS — Q738 Other reduction defects of unspecified limb(s): Secondary | ICD-10-CM

## 2015-02-26 DIAGNOSIS — J309 Allergic rhinitis, unspecified: Secondary | ICD-10-CM | POA: Diagnosis not present

## 2015-02-26 DIAGNOSIS — F419 Anxiety disorder, unspecified: Secondary | ICD-10-CM | POA: Diagnosis present

## 2015-02-26 DIAGNOSIS — I10 Essential (primary) hypertension: Secondary | ICD-10-CM | POA: Diagnosis not present

## 2015-02-26 DIAGNOSIS — R918 Other nonspecific abnormal finding of lung field: Secondary | ICD-10-CM | POA: Diagnosis not present

## 2015-02-26 DIAGNOSIS — I110 Hypertensive heart disease with heart failure: Secondary | ICD-10-CM | POA: Diagnosis present

## 2015-02-26 DIAGNOSIS — S99919A Unspecified injury of unspecified ankle, initial encounter: Secondary | ICD-10-CM | POA: Diagnosis not present

## 2015-02-26 DIAGNOSIS — S82852D Displaced trimalleolar fracture of left lower leg, subsequent encounter for closed fracture with routine healing: Secondary | ICD-10-CM | POA: Diagnosis not present

## 2015-02-26 DIAGNOSIS — S92909A Unspecified fracture of unspecified foot, initial encounter for closed fracture: Secondary | ICD-10-CM | POA: Diagnosis not present

## 2015-02-26 DIAGNOSIS — G8918 Other acute postprocedural pain: Secondary | ICD-10-CM | POA: Diagnosis not present

## 2015-02-26 DIAGNOSIS — S9302XA Subluxation of left ankle joint, initial encounter: Secondary | ICD-10-CM | POA: Diagnosis not present

## 2015-02-26 DIAGNOSIS — M25572 Pain in left ankle and joints of left foot: Secondary | ICD-10-CM | POA: Diagnosis not present

## 2015-02-26 DIAGNOSIS — I824Z2 Acute embolism and thrombosis of unspecified deep veins of left distal lower extremity: Secondary | ICD-10-CM | POA: Diagnosis not present

## 2015-02-26 DIAGNOSIS — M79669 Pain in unspecified lower leg: Secondary | ICD-10-CM | POA: Diagnosis present

## 2015-02-26 DIAGNOSIS — S82852B Displaced trimalleolar fracture of left lower leg, initial encounter for open fracture type I or II: Secondary | ICD-10-CM | POA: Diagnosis not present

## 2015-02-26 DIAGNOSIS — R2681 Unsteadiness on feet: Secondary | ICD-10-CM | POA: Diagnosis not present

## 2015-02-26 DIAGNOSIS — J9602 Acute respiratory failure with hypercapnia: Secondary | ICD-10-CM | POA: Diagnosis not present

## 2015-02-26 DIAGNOSIS — W010XXA Fall on same level from slipping, tripping and stumbling without subsequent striking against object, initial encounter: Secondary | ICD-10-CM | POA: Diagnosis present

## 2015-02-26 DIAGNOSIS — T148 Other injury of unspecified body region: Secondary | ICD-10-CM | POA: Diagnosis not present

## 2015-02-26 DIAGNOSIS — R262 Difficulty in walking, not elsewhere classified: Secondary | ICD-10-CM | POA: Diagnosis not present

## 2015-02-26 DIAGNOSIS — I824Z1 Acute embolism and thrombosis of unspecified deep veins of right distal lower extremity: Secondary | ICD-10-CM | POA: Diagnosis not present

## 2015-02-26 DIAGNOSIS — R069 Unspecified abnormalities of breathing: Secondary | ICD-10-CM | POA: Diagnosis present

## 2015-02-26 HISTORY — DX: Autistic disorder: F84.0

## 2015-02-26 LAB — CBC WITH DIFFERENTIAL/PLATELET
Basophils Absolute: 0 10*3/uL (ref 0.0–0.1)
Basophils Relative: 0 %
Eosinophils Absolute: 0 10*3/uL (ref 0.0–0.7)
Eosinophils Relative: 0 %
HEMATOCRIT: 38.6 % — AB (ref 39.0–52.0)
HEMOGLOBIN: 12.8 g/dL — AB (ref 13.0–17.0)
LYMPHS ABS: 0.9 10*3/uL (ref 0.7–4.0)
LYMPHS PCT: 10 %
MCH: 29.3 pg (ref 26.0–34.0)
MCHC: 33.2 g/dL (ref 30.0–36.0)
MCV: 88.3 fL (ref 78.0–100.0)
MONOS PCT: 6 %
Monocytes Absolute: 0.6 10*3/uL (ref 0.1–1.0)
NEUTROS ABS: 8.3 10*3/uL — AB (ref 1.7–7.7)
NEUTROS PCT: 84 %
Platelets: 266 10*3/uL (ref 150–400)
RBC: 4.37 MIL/uL (ref 4.22–5.81)
RDW: 13.2 % (ref 11.5–15.5)
WBC: 9.9 10*3/uL (ref 4.0–10.5)

## 2015-02-26 LAB — I-STAT CHEM 8, ED
BUN: 24 mg/dL — AB (ref 6–20)
CREATININE: 0.8 mg/dL (ref 0.61–1.24)
Calcium, Ion: 1.13 mmol/L (ref 1.12–1.23)
Chloride: 105 mmol/L (ref 101–111)
Glucose, Bld: 154 mg/dL — ABNORMAL HIGH (ref 65–99)
HCT: 42 % (ref 39.0–52.0)
Hemoglobin: 14.3 g/dL (ref 13.0–17.0)
Potassium: 4.5 mmol/L (ref 3.5–5.1)
Sodium: 141 mmol/L (ref 135–145)
TCO2: 27 mmol/L (ref 0–100)

## 2015-02-26 LAB — CREATININE, SERUM
CREATININE: 0.9 mg/dL (ref 0.61–1.24)
GFR calc Af Amer: >60 mL/min (ref >60)

## 2015-02-26 MED ORDER — MORPHINE SULFATE (PF) 2 MG/ML IV SOLN
0.5000 mg | INTRAVENOUS | Status: DC | PRN
  Administered 2015-02-27 – 2015-02-28 (×2): 0.5 mg via INTRAVENOUS
  Filled 2015-02-26 (×2): qty 1

## 2015-02-26 MED ORDER — SENNA 8.6 MG PO TABS
1.0000 | ORAL_TABLET | Freq: Two times a day (BID) | ORAL | Status: DC
  Administered 2015-02-26 – 2015-03-06 (×14): 8.6 mg via ORAL
  Filled 2015-02-26 (×16): qty 1

## 2015-02-26 MED ORDER — ENOXAPARIN SODIUM 40 MG/0.4ML ~~LOC~~ SOLN
40.0000 mg | Freq: Every day | SUBCUTANEOUS | Status: DC
  Administered 2015-02-26 – 2015-02-27 (×2): 40 mg via SUBCUTANEOUS
  Filled 2015-02-26 (×2): qty 0.4

## 2015-02-26 MED ORDER — SERTRALINE HCL 100 MG PO TABS
100.0000 mg | ORAL_TABLET | Freq: Every day | ORAL | Status: DC
  Administered 2015-02-26 – 2015-03-06 (×8): 100 mg via ORAL
  Filled 2015-02-26 (×7): qty 1
  Filled 2015-02-26: qty 2

## 2015-02-26 MED ORDER — LISINOPRIL 20 MG PO TABS
20.0000 mg | ORAL_TABLET | Freq: Every day | ORAL | Status: DC
  Administered 2015-02-26 – 2015-03-06 (×8): 20 mg via ORAL
  Filled 2015-02-26 (×8): qty 1

## 2015-02-26 MED ORDER — ONDANSETRON HCL 4 MG/2ML IJ SOLN: 4.0000 mg | Freq: Four times a day (QID) | INTRAMUSCULAR | Status: DC | PRN

## 2015-02-26 MED ORDER — HYDROMORPHONE HCL 1 MG/ML IJ SOLN
1.0000 mg | Freq: Once | INTRAMUSCULAR | Status: AC
  Administered 2015-02-26: 1 mg via INTRAVENOUS
  Filled 2015-02-26: qty 1

## 2015-02-26 MED ORDER — ONDANSETRON HCL 4 MG/2ML IJ SOLN
4.0000 mg | Freq: Once | INTRAMUSCULAR | Status: AC
  Administered 2015-02-26: 4 mg via INTRAVENOUS
  Filled 2015-02-26: qty 2

## 2015-02-26 MED ORDER — POLYETHYLENE GLYCOL 3350 17 G PO PACK
17.0000 g | PACK | Freq: Every day | ORAL | Status: DC | PRN
  Filled 2015-02-26: qty 1

## 2015-02-26 MED ORDER — BISACODYL 10 MG RE SUPP: 10.0000 mg | Freq: Every day | RECTAL | Status: DC | PRN

## 2015-02-26 MED ORDER — PANTOPRAZOLE SODIUM 40 MG PO TBEC
40.0000 mg | DELAYED_RELEASE_TABLET | Freq: Every day | ORAL | Status: DC
  Administered 2015-02-26 – 2015-03-01 (×4): 40 mg via ORAL
  Filled 2015-02-26 (×4): qty 1

## 2015-02-26 MED ORDER — TRAZODONE HCL 50 MG PO TABS
50.0000 mg | ORAL_TABLET | Freq: Every day | ORAL | Status: DC
  Administered 2015-02-26 – 2015-03-05 (×7): 50 mg via ORAL
  Filled 2015-02-26 (×9): qty 1

## 2015-02-26 MED ORDER — DOCUSATE SODIUM 100 MG PO CAPS
100.0000 mg | ORAL_CAPSULE | Freq: Two times a day (BID) | ORAL | Status: DC
  Administered 2015-02-26 – 2015-03-06 (×14): 100 mg via ORAL
  Filled 2015-02-26 (×16): qty 1

## 2015-02-26 MED ORDER — HYDROCODONE-ACETAMINOPHEN 5-325 MG PO TABS
1.0000 | ORAL_TABLET | Freq: Four times a day (QID) | ORAL | Status: DC | PRN
  Administered 2015-02-27 – 2015-03-01 (×8): 2 via ORAL
  Administered 2015-03-01: 1 via ORAL
  Filled 2015-02-26 (×7): qty 2
  Filled 2015-02-26: qty 1
  Filled 2015-02-26: qty 2

## 2015-02-26 MED ORDER — MAGNESIUM CITRATE PO SOLN: 1.0000 | Freq: Once | ORAL | Status: DC | PRN

## 2015-02-26 MED ORDER — FESOTERODINE FUMARATE ER 4 MG PO TB24
4.0000 mg | ORAL_TABLET | Freq: Every day | ORAL | Status: DC
  Administered 2015-02-26 – 2015-03-06 (×8): 4 mg via ORAL
  Filled 2015-02-26 (×10): qty 1

## 2015-02-26 NOTE — ED Notes (Signed)
Bed: WA02 Expected date:  Expected time:  Means of arrival:  Comments: Ankle deformity

## 2015-02-26 NOTE — ED Provider Notes (Signed)
CSN: [id number]     Arrival date & time 02/26/15  1738 History   First MD Initiated Contact with Patient 02/26/15 1742     Chief Complaint  Patient presents with  . Ankle Pain    deformity     (Consider location/radiation/quality/duration/timing/severity/associated sxs/prior Treatment) Patient is a 55 y.o. male presenting with ankle pain. The history is provided by the patient.  Ankle Pain Location:  Ankle Time since incident:  1 hour Injury: yes   Mechanism of injury: fall   Fall:    Fall occurred: Walking down a hill and slipped in the wet grass.   Impact surface:  Grass   Point of impact:  Feet Ankle location:  L ankle Pain details:    Quality:  Shooting and throbbing   Radiates to:  Does not radiate   Severity:  Severe   Onset quality:  Sudden   Timing:  Constant   Progression:  Unchanged Chronicity:  New Prior injury to area:  No Relieved by: Improved after immobilization and fentanyl by EMS. Worsened by:  Bearing weight and flexion Ineffective treatments:  None tried Associated symptoms: decreased ROM and swelling     Past Medical History  Diagnosis Date  . Depression   . GERD (gastroesophageal reflux disease)   . Hyperlipidemia   . Anxiety   . Hypertension   . Allergic rhinitis   . OSA (obstructive sleep apnea)   . 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 Anemia of chronic disease   . Autism    History reviewed. No pertinent past surgical history. Family History  Problem Relation Age of Onset  . Alcohol abuse    . Arthritis    . Breast cancer    . Prostate cancer    . Hyperlipidemia    . Heart disease    . Stroke    . Kidney disease    . Diabetes    . Pancreatic cancer    . Heart disease Father    Social History  Substance Use Topics  . Smoking status: Never Smoker   . Smokeless tobacco: None  . Alcohol Use: No    Review of Systems  All other systems reviewed and are negative.     Allergies  Fluoxetine hcl  Home Medications   Prior  to Admission medications   Medication Sig Start Date End Date Taking? Authorizing Provider  aspirin 81 MG EC tablet Take 1 tablet (81 mg total) by mouth daily. 09/25/11  Yes Corwin Levins, MD  fesoterodine (TOVIAZ) 4 MG TB24 tablet Take 1 tablet (4 mg total) by mouth daily. 03/03/14  Yes Corwin Levins, MD  lisinopril (PRINIVIL,ZESTRIL) 20 MG tablet Take 1 tablet (20 mg total) by mouth daily. 03/03/14  Yes Corwin Levins, MD  omeprazole (PRILOSEC) 20 MG capsule Take 1 capsule (20 mg total) by mouth daily. 03/03/14  Yes Corwin Levins, MD  sertraline (ZOLOFT) 100 MG tablet Take 1 tablet (100 mg total) by mouth daily. 03/03/14  Yes Corwin Levins, MD  traZODone (DESYREL) 50 MG tablet TAKE 1 TABLET BY MOUTH AT BEDTIME Patient taking differently: Take 50 mg by mouth at bedtime.  02/09/13  Yes Corwin Levins, MD  guaiFENesin-dextromethorphan (ROBITUSSIN DM) 100-10 MG/5ML syrup Take 5 mLs by mouth every 4 (four) hours as needed for cough. Patient not taking: Reported on 02/26/2015 02/22/14   Maryruth Bun Rama, MD   BP 160/101 mmHg  Pulse 90  Temp(Src) 97.8 F (36.6 C) (Oral)  Resp 18  Ht 5\' 9"  (1.753 m)  Wt 237 lb (107.502 kg)  BMI 34.98 kg/m2  SpO2 94% Physical Exam  Constitutional: He is oriented to person, place, and time. He appears well-developed and well-nourished. No distress.  HENT:  Head: Normocephalic and atraumatic.  Mouth/Throat: Oropharynx is clear and moist.  Eyes: Conjunctivae and EOM are normal. Pupils are equal, round, and reactive to light.  Neck: Normal range of motion. Neck supple.  Cardiovascular: Normal rate, regular rhythm and intact distal pulses.   No murmur heard. Pulmonary/Chest: Effort normal and breath sounds normal. No respiratory distress. He has no wheezes. He has no rales.  Abdominal: Soft. He exhibits no distension. There is no tenderness. There is no rebound and no guarding.  Musculoskeletal: He exhibits tenderness. He exhibits no edema.       Left ankle: He exhibits decreased  range of motion, swelling and deformity. Tenderness. Lateral malleolus and medial malleolus tenderness found.       Feet:  2+ DP pulse present in the right foot with normal sensation and color. Capillary refill less than 2 seconds  Neurological: He is alert and oriented to person, place, and time.  Skin: Skin is warm and dry. No rash noted. No erythema.  Psychiatric: He has a normal mood and affect. His behavior is normal.  Nursing note and vitals reviewed.   ED Course  Reduction of dislocation Date/Time: 02/26/2015 9:54 PM Performed by: Gwyneth Sprout Authorized by: Gwyneth Sprout Consent: Verbal consent obtained. Risks and benefits: risks, benefits and alternatives were discussed Consent given by: patient Relevant documents: relevant documents present and verified Imaging studies: imaging studies available Patient identity confirmed: verbally with patient Time out: Immediately prior to procedure a "time out" was called to verify the correct patient, procedure, equipment, support staff and site/side marked as required. Local anesthesia used: no Patient sedated: yes Sedation type: anxiolysis Analgesia: hydromorphone Vitals: Vital signs were monitored during sedation. Patient tolerance: Patient tolerated the procedure well with no immediate complications Comments: With foot flexion and traction and medial compression pt had reduction and pop felt   (including critical care time) Labs Review Labs Reviewed - No data to display  Imaging Review Dg Ankle Complete Left  02/26/2015  CLINICAL DATA:  Slipped and fell in the back yard today. Left foot and ankle pain. EXAM: LEFT ANKLE COMPLETE - 3+ VIEW COMPARISON:  None. FINDINGS: There is a trimalleolar fracture dislocation. The talar dome is dislocated laterally and posteriorly relative to the tibial plafond. There is a displaced distal fibular metaphysis fracture with 11 mm of lateral displacement. There is a displaced and rotated  medial malleolar fracture with approximately 10 mm of displacement. There is severe surrounding soft tissue swelling. IMPRESSION: Trimalleolar left ankle fracture and dislocation. Electronically Signed   By: Elige Ko   On: 02/26/2015 18:28   Dg Ankle Left Port  02/26/2015  CLINICAL DATA:  55 year old male status post reduction of the left ankle fracture dislocation. EXAM: PORTABLE LEFT ANKLE - 2 VIEW COMPARISON:  Earlier radiograph dated 02/26/2015 FINDINGS: Evaluation is limited due to overlying cast. There has been interval reduction of the previously seen trimalleolar fracture dislocation of the left ankle. IMPRESSION: Interval reduction of the previously seen left ankle fracture/dislocation. Electronically Signed   By: Elgie Collard M.D.   On: 02/26/2015 20:17   Dg Foot Complete Left  02/26/2015  CLINICAL DATA:  Fall. EXAM: LEFT FOOT - COMPLETE 3+ VIEW COMPARISON:  None. FINDINGS: Fracture dislocation of the ankle.  See separate ankle  report No other fracture in the foot. Mild degenerative change in the first MTP. IMPRESSION: Fracture dislocation of the ankle. Electronically Signed   By: Marlan Palau M.D.   On: 02/26/2015 18:29   I have personally reviewed and evaluated these images and lab results as part of my medical decision-making.  SPLINT APPLICATION Date/Time: 9:55 PM Authorized by: Gwyneth Sprout Consent: Verbal consent obtained. Risks and benefits: risks, benefits and alternatives were discussed Consent given by: patient Splint applied by: orthopedic technician Location details: left ankle Splint type: catalac Supplies used: plaster Post-procedure: The splinted body part was neurovascularly unchanged following the procedure. Patient tolerance: Patient tolerated the procedure well with no immediate complications.     MDM   Final diagnoses:  Reduction defect of extremity  Trimalleolar fracture of ankle, closed, left, initial encounter    Patient is a 55 year old  male with a history of autism, hypertension who presents today after a mechanical fall down a hill with left ankle deformity but good pulses and sensation.  X-rays confirm a trimalleolar left ankle fracture and dislocation.  Patient denies any other injury. No LOC or head injury.  She takes no anticoagulation. He does have a history of autism and lives alone but does have a family member who checks on him as needed.  Patient ankle was reduced at bedside with splint placement and post reduction film showing a left ankle fracture reduction with improved alignment.  Family member arrives however when attempted to walk the patient he was unable to ambulate with crutches was very shaky and off-balance and is having some nausea and vomiting related to the pain medication. Discussed with Dr. Victorino Dike who will admit the patient over to [facility] for further management.    Gwyneth Sprout, MD 02/26/15 2158

## 2015-02-26 NOTE — ED Notes (Signed)
Pt here via EMS with deformity to left ankle. Pt states he slipped in the mud going down a hill.EMS gave 50 mcg of fentanyl and  of zofran IV. Pt does not take blood thinners. Pt denies hitting his head or pain elsewhere.

## 2015-02-27 ENCOUNTER — Other Ambulatory Visit: Payer: Self-pay | Admitting: Orthopedic Surgery

## 2015-02-27 ENCOUNTER — Encounter (HOSPITAL_COMMUNITY): Payer: Self-pay | Admitting: *Deleted

## 2015-02-27 LAB — SURGICAL PCR SCREEN
MRSA, PCR: NEGATIVE
STAPHYLOCOCCUS AUREUS: NEGATIVE

## 2015-02-27 MED ORDER — DEXTROSE 5 % IV SOLN
3.0000 g | INTRAVENOUS | Status: AC
  Administered 2015-03-02: 3 g via INTRAVENOUS
  Filled 2015-02-27 (×3): qty 3000

## 2015-02-27 MED ORDER — SODIUM CHLORIDE 0.9 % IV SOLN
INTRAVENOUS | Status: DC
  Administered 2015-02-27: 18:00:00 via INTRAVENOUS

## 2015-02-27 MED ORDER — SODIUM CHLORIDE 0.9 % IV BOLUS (SEPSIS)
500.0000 mL | Freq: Once | INTRAVENOUS | Status: AC
  Administered 2015-02-27: 500 mL via INTRAVENOUS

## 2015-02-27 MED ORDER — MAGIC MOUTHWASH W/LIDOCAINE
5.0000 mL | Freq: Three times a day (TID) | ORAL | Status: DC | PRN
  Administered 2015-02-27 – 2015-02-28 (×2): 5 mL via ORAL
  Filled 2015-02-27 (×5): qty 5

## 2015-02-27 MED ORDER — CHLORHEXIDINE GLUCONATE 4 % EX LIQD
60.0000 mL | Freq: Once | CUTANEOUS | Status: DC
  Filled 2015-02-27: qty 60

## 2015-02-27 MED ORDER — CEFAZOLIN SODIUM-DEXTROSE 2-3 GM-% IV SOLR: 2.0000 g | INTRAVENOUS | Status: DC

## 2015-02-27 NOTE — Evaluation (Signed)
Physical Therapy Evaluation Patient Details Name: Phillip Butler MRN: [id number] DOB: Aug 16, 1960 Today's Date: 02/27/2015   History of Present Illness  54 yo with PMHx of autism, depression, GERD, hyperlipidemia, anxiety, HTN who fell at home with left ankle fx s/p closed reduction and splinting  Clinical Impression  Pt pleasant with flat affect. Pt aware of need to maintain NWB on LLE and was able to maintain throughout session. Pt educated for benefit of shoe for RLE to decrease impact with hopping. Pt requires cues and assist for transfers and gait without assist of family for home. Pt with decreased mobility, function and gait who will benefit from acute therapy to maximize mobility and function to decrease burden of care.    Follow Up Recommendations SNF;Supervision/Assistance - 24 hour    Equipment Recommendations  Rolling walker with 5" wheels;Wheelchair (measurements PT);3in1 (PT)    Recommendations for Other Services OT consult     Precautions / Restrictions Precautions Precautions: Fall Restrictions Weight Bearing Restrictions: Yes LLE Weight Bearing: Non weight bearing      Mobility  Bed Mobility Overal bed mobility: Modified Independent             General bed mobility comments: with rail and increased time  Transfers Overall transfer level: Needs assistance   Transfers: Sit to/from Stand Sit to Stand: Min guard         General transfer comment:  max cues for hand placement with pt having difficulty following cues and maintaining grip on RW with return to sitting  Ambulation/Gait Ambulation/Gait assistance: Min assist Ambulation Distance (Feet): 18 Feet Assistive device: Rolling walker (2 wheeled) Gait Pattern/deviations: Step-to pattern   Gait velocity interpretation: Below normal speed for age/gender General Gait Details: cues for posture, position in RW and safety with chair to follow, pt does not state fatigue and has to be asked for need to  sit  Stairs            Wheelchair Mobility    Modified Rankin (Stroke Patients Only)       Balance Overall balance assessment: Needs assistance;History of Falls   Sitting balance-Leahy Scale: Good       Standing balance-Leahy Scale: Poor                               Pertinent Vitals/Pain Pain Assessment: No/denies pain    Home Living Family/patient expects to be discharged to:: Private residence Living Arrangements: Alone Available Help at Discharge: Family;Available PRN/intermittently Type of Home: House Home Access: Stairs to enter   [employer] of Steps: 3 Home Layout: One level Home Equipment: None      Prior Function Level of Independence: Independent         Comments: pt does not drive     Hand Dominance        Extremity/Trunk Assessment   Upper Extremity Assessment: Overall WFL for tasks assessed           Lower Extremity Assessment: Generalized weakness      Cervical / Trunk Assessment: Normal  Communication   Communication: No difficulties  Cognition Arousal/Alertness: Awake/alert Behavior During Therapy: Flat affect Overall Cognitive Status: No family/caregiver present to determine baseline cognitive functioning                      General Comments      Exercises        Assessment/Plan    PT Assessment  Patient needs continued PT services  PT Diagnosis Difficulty walking;Generalized weakness   PT Problem List Decreased activity tolerance;Decreased balance;Decreased mobility;Decreased knowledge of precautions;Decreased knowledge of use of DME  PT Treatment Interventions Gait training;Functional mobility training;Therapeutic activities;Therapeutic exercise;Balance training;Patient/family education;Stair training;DME instruction   PT Goals (Current goals can be found in the Care Plan section) Acute Rehab PT Goals Patient Stated Goal: be able to return home PT Goal Formulation: With  patient Time For Goal Achievement: 03/13/15 Potential to Achieve Goals: Fair    Frequency Min 3X/week   Barriers to discharge Decreased caregiver support      Co-evaluation               End of Session Equipment Utilized During Treatment: Gait belt Activity Tolerance: Patient tolerated treatment well Patient left: in chair;with call bell/phone within reach;with chair alarm set Nurse Communication: Mobility status         Time: 1610-9604 PT Time Calculation (min) (ACUTE ONLY): 13 min   Charges:   PT Evaluation $PT Eval Moderate Complexity: 1 Procedure     PT G CodesDelorse Lek 02/27/2015, 9:54 AM Delaney Meigs, PT [phone]

## 2015-02-27 NOTE — Progress Notes (Signed)
Pt arrived to 5N12, settled into room , and medicated for pain. Pt's brother in law was made aware of arrival as per report from [place] at [facility].

## 2015-02-27 NOTE — Progress Notes (Signed)
Upon reviewing vitals, RN noticed that HR was elevated to 125 around 3 pm. RN reassessed with pt sitting up in bed, stated pain was 5/10. T 100.2-given IS, BP 151/94, HR 127, O2 sat 88% RA-applied 2L with O2 rising to 94%. Pt denied chest pain or SOB, has voided in the urinal several times today, and drinking fluids with meals. RN notified Jill Alexanders, [place] who instructed RN to closely monitor HR believing pain could be contributing, he asked if pt was having calf pain. RN asked pt if he was having calf pain, to which he replied yes in the left leg. Stated it was a dull pain that comes and goes. RN updated PA of this, who ordered doppler of LLE to r/o DVT. Pts foot was elevated, and given PRN morphine. Night RN aware of situation.   Lowella Dell  02/27/2015

## 2015-02-27 NOTE — H&P (Signed)
Reason for Consult: Left trimalleolar fracture Referring Physician: Blanchie Dessert, MD  Phillip Butler is an 55 y.o. male.  HPI: ACY ORSAK is a 55 year old male, with PMH listed below, that presented to the [facility] ED on 02/26/15 via EMS after sustaining a fall while ambulating in his back yard.  Patient notes that he felt immediate sharp severe pain and was unable to WB.  EMS was then contacted and he was brought to the ED where radiographs were obtained of his L ankle that demonstrated a trimalleolar fracture.  Dr. Wylene Simmer was then consulted.  The patient was then splinted, made NWB, and transferred to [facility] for definitive care.  The patient does have a PMH significant for autism and is a poor historian.  The patient notes that NWB along with pain medication make his ankle feel better.  He denies any previous injury or surgery to this ankle.  Patient states that he lives by himself and is unemployed.  He denies any history of DM, thyroid disease, autoimmune diseases, CA, and he is a nonsmoker.  Surgical considerations: HTN, obstructive sleep apnea, hx of autism and his living situation.  Past Medical History  Diagnosis Date  . Depression   . GERD (gastroesophageal reflux disease)   . Hyperlipidemia   . Anxiety   . Hypertension   . Allergic rhinitis   . OSA (obstructive sleep apnea)   . 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 Anemia of chronic disease   . Autism     History reviewed. No pertinent past surgical history.  Family History  Problem Relation Age of Onset  . Alcohol abuse    . Arthritis    . Breast cancer    . Prostate cancer    . Hyperlipidemia    . Heart disease    . Stroke    . Kidney disease    . Diabetes    . Pancreatic cancer    . Heart disease Father     Social History:  reports that he has never smoked. He does not have any smokeless tobacco history on file. He reports that he does not drink alcohol or use illicit  drugs.  Allergies:  Allergies  Allergen Reactions  . Fluoxetine Hcl     REACTION: ineffective    Medications: I have reviewed the patient's current medications.  Results for orders placed or performed during the hospital encounter of 02/26/15 (from the past 48 hour(s))  CBC with Differential/Platelet     Status: Abnormal   Collection Time: 02/26/15 10:09 PM  Result Value Ref Range   WBC 9.9 4.0 - 10.5 K/uL   RBC 4.37 4.22 - 5.81 MIL/uL   Hemoglobin 12.8 (L) 13.0 - 17.0 g/dL   HCT 38.6 (L) 39.0 - 52.0 %   MCV 88.3 78.0 - 100.0 fL   MCH 29.3 26.0 - 34.0 pg   MCHC 33.2 30.0 - 36.0 g/dL   RDW 13.2 11.5 - 15.5 %   Platelets 266 150 - 400 K/uL   Neutrophils Relative % 84 %   Neutro Abs 8.3 (H) 1.7 - 7.7 K/uL   Lymphocytes Relative 10 %   Lymphs Abs 0.9 0.7 - 4.0 K/uL   Monocytes Relative 6 %   Monocytes Absolute 0.6 0.1 - 1.0 K/uL   Eosinophils Relative 0 %   Eosinophils Absolute 0.0 0.0 - 0.7 K/uL   Basophils Relative 0 %   Basophils Absolute 0.0 0.0 - 0.1 K/uL  Creatinine, serum  Status: None   Collection Time: 02/26/15 10:09 PM  Result Value Ref Range   Creatinine, Ser 0.90 0.61 - 1.24 mg/dL   GFR calc non Af Amer >60 >60 mL/min   GFR calc Af Amer >60 >60 mL/min    Comment: (NOTE) The eGFR has been calculated using the CKD EPI equation. This calculation has not been validated in all clinical situations. eGFR's persistently <60 mL/min signify possible Chronic Kidney Disease.   I-stat chem 8, ed     Status: Abnormal   Collection Time: 02/26/15 10:18 PM  Result Value Ref Range   Sodium 141 135 - 145 mmol/L   Potassium 4.5 3.5 - 5.1 mmol/L   Chloride 105 101 - 111 mmol/L   BUN 24 (H) 6 - 20 mg/dL   Creatinine, Ser 0.80 0.61 - 1.24 mg/dL   Glucose, Bld 154 (H) 65 - 99 mg/dL   Calcium, Ion 1.13 1.12 - 1.23 mmol/L   TCO2 27 0 - 100 mmol/L   Hemoglobin 14.3 13.0 - 17.0 g/dL   HCT 42.0 39.0 - 52.0 %    Dg Ankle Complete Left  02/26/2015  CLINICAL DATA:  Slipped and  fell in the back yard today. Left foot and ankle pain. EXAM: LEFT ANKLE COMPLETE - 3+ VIEW COMPARISON:  None. FINDINGS: There is a trimalleolar fracture dislocation. The talar dome is dislocated laterally and posteriorly relative to the tibial plafond. There is a displaced distal fibular metaphysis fracture with 11 mm of lateral displacement. There is a displaced and rotated medial malleolar fracture with approximately 10 mm of displacement. There is severe surrounding soft tissue swelling. IMPRESSION: Trimalleolar left ankle fracture and dislocation. Electronically Signed   By: Kathreen Devoid   On: 02/26/2015 18:28   Dg Ankle Left Port  02/26/2015  CLINICAL DATA:  55 year old male status post reduction of the left ankle fracture dislocation. EXAM: PORTABLE LEFT ANKLE - 2 VIEW COMPARISON:  Earlier radiograph dated 02/26/2015 FINDINGS: Evaluation is limited due to overlying cast. There has been interval reduction of the previously seen trimalleolar fracture dislocation of the left ankle. IMPRESSION: Interval reduction of the previously seen left ankle fracture/dislocation. Electronically Signed   By: Anner Crete M.D.   On: 02/26/2015 20:17   Dg Foot Complete Left  02/26/2015  CLINICAL DATA:  Fall. EXAM: LEFT FOOT - COMPLETE 3+ VIEW COMPARISON:  None. FINDINGS: Fracture dislocation of the ankle.  See separate ankle report No other fracture in the foot. Mild degenerative change in the first MTP. IMPRESSION: Fracture dislocation of the ankle. Electronically Signed   By: Franchot Gallo M.D.   On: 02/26/2015 18:29    ROS:   Gen: Denies fever, chills, weight change, fatigue, night sweats MSK: C/o of LLE pain with WB Neuro: Denies headache, numbness, weakness, slurred speech, loss of memory or consciousness Derm: Denies rash, dry skin, scaling or peeling skin change HEENT: Denies blurred vision, double vision, neck stiffness, dysphagia PULM: Denies shortness of breath, cough, sputum production, hemoptysis,  wheezing CV: Denies chest pain, edema, orthopnea, palpitations GI: Denies abdominal pain, nausea, vomiting, diarrhea, hematochezia, melena, constipation  Endocrine: Denies hot or cold intolerance, polyuria, polyphagia or appetite change Heme: Denies easy bruising, bleeding, bleeding gums  PE:  General: WDWN patient in NAD. Psych:  Appropriate mood and affect. Neuro:  A&O x 3, however the patient has some difficulty with short term memory.  Moving all extremities, sensation intact to light touch HEENT:  EOMs intact Chest:  Even non-labored respirations Skin:  Posterior splint  C/D/I, no rashes or lesions Extremities: warm/dry, no visible edema, erythmea, or echymosis.  No lymphadenopathy. Pulses: Popliteus 2+ MSK:  ROM: EHL/FHL intact, MMT: patient can perform quad set  Blood pressure 145/93, pulse 102, temperature 97.8 F (36.6 C), temperature source Oral, resp. rate 20, height '5\' 9"'  (1.753 m), weight 120.339 kg (265 lb 4.8 oz), SpO2 96 %.   Assessment/Plan: L ankle trimalleolar fracture/dislocation  This is an unstable fracture.  After discussing the risks of surgical intervention, including risk of bleeding, blood clots, non-union, need for additional surgery, infection, amputation, and death the patient has agreed to undergo ORIF of his L ankle fracture.  Up with therapy NWB LLE Plan for ORIF L trimalleolar fx performed by Dr. Wylene Simmer on Thursday 03/02/15. He will be NPO after midnight the night prior to surgery D/C lovenox prior to the day of surgery.   Mechele Claude, PA-C, [employer] Office:  [phone]

## 2015-02-28 ENCOUNTER — Inpatient Hospital Stay (HOSPITAL_COMMUNITY): Payer: Medicare Other

## 2015-02-28 DIAGNOSIS — M79662 Pain in left lower leg: Secondary | ICD-10-CM

## 2015-02-28 DIAGNOSIS — I5033 Acute on chronic diastolic (congestive) heart failure: Secondary | ICD-10-CM | POA: Diagnosis present

## 2015-02-28 DIAGNOSIS — I824Z9 Acute embolism and thrombosis of unspecified deep veins of unspecified distal lower extremity: Secondary | ICD-10-CM | POA: Diagnosis present

## 2015-02-28 DIAGNOSIS — I5032 Chronic diastolic (congestive) heart failure: Secondary | ICD-10-CM | POA: Diagnosis present

## 2015-02-28 DIAGNOSIS — F840 Autistic disorder: Secondary | ICD-10-CM | POA: Diagnosis present

## 2015-02-28 MED ORDER — HEPARIN BOLUS VIA INFUSION
6500.0000 [IU] | Freq: Once | INTRAVENOUS | Status: DC
  Filled 2015-02-28: qty 6500

## 2015-02-28 MED ORDER — HEPARIN (PORCINE) IN NACL 100-0.45 UNIT/ML-% IJ SOLN
1850.0000 [IU]/h | INTRAMUSCULAR | Status: AC
  Administered 2015-02-28: 1600 [IU]/h via INTRAVENOUS
  Filled 2015-02-28 (×4): qty 250

## 2015-02-28 NOTE — [place] FL2 (Signed)
[place] MEDICAID FL2 LEVEL OF CARE SCREENING TOOL     IDENTIFICATION  Patient Name: Phillip Butler Birthdate: May 16, 1960 Sex: male Admission Date (Current Location): 02/26/2015  [place] and [place] Number:  Haynes Bast [id number] A Facility and Address:  [facility], [address], [place], [place] [address]      Provider Number: [id number]  Attending Physician Name and Address:  Toni Arthurs, MD  Relative Name and Phone Number:   (n/a)    Current Level of Care: Hospital Recommended Level of Care: Skilled Nursing Facility Prior Approval Number:    Date Approved/Denied:   PASRR Number:   [id number]   Discharge Plan: SNF    Current Diagnoses: Patient Active Problem List   Diagnosis Date Noted  . Fracture of ankle, trimalleolar, right, closed 02/26/2015  . Closed trimalleolar fracture of left ankle 02/26/2015  . Hyperlipidemia 02/19/2014  . Depression with anxiety 02/19/2014  . Acute respiratory failure with hypoxia (HCC) 02/19/2014  . Normocytic anemia 02/19/2014  . Community acquired pneumonia 02/18/2014  . Hearing loss, bilateral 02/03/2012  . Preventative health care 11/28/2010  . Obstructive sleep apnea 08/14/2009  . Essential hypertension 02/16/2007  . ALLERGIC RHINITIS 02/16/2007  . GERD 02/16/2007    Orientation RESPIRATION BLADDER Height & Weight     Self, Time, Situation  Normal Continent Weight: 265 lb 4.8 oz (120.339 kg) Height:   (175.3 cm)  BEHAVIORAL SYMPTOMS/MOOD NEUROLOGICAL BOWEL NUTRITION STATUS      Continent  (NPO)  AMBULATORY STATUS COMMUNICATION OF NEEDS Skin   Limited Assist   Normal                       Personal Care Assistance Level of Assistance              Functional Limitations Info             SPECIAL CARE FACTORS FREQUENCY  PT (By licensed PT)     PT Frequency:  (3x/wk)              Contractures      Additional Factors Info  Code Status, Allergies Code Status Info:   (FULL) Allergies Info:  (fluoxetine)           Current Medications (02/28/2015):  This is the current hospital active medication list Current Facility-Administered Medications  Medication Dose Route Frequency Provider Last Rate Last Dose  . 0.9 %  sodium chloride infusion   Intravenous Continuous Florentina [place], [place] 75 mL/hr at 02/27/15 1807    . bisacodyl (DULCOLAX) suppository 10 mg  10 mg Rectal Daily PRN Toni Arthurs, MD      . Melene Muller ON 03/02/2015] ceFAZolin (ANCEF) 3 g in dextrose 5 % 50 mL IVPB  3 g Intravenous To SS-Surg Remi Haggard, RPH      . [START ON 03/02/2015] chlorhexidine (HIBICLENS) 4 % liquid 4 application  60 mL Topical Once [facility], PA-C      . docusate sodium (COLACE) capsule 100 mg  100 mg Oral BID Toni Arthurs, MD   100 mg at 02/28/15 1033  . enoxaparin (LOVENOX) injection 40 mg  40 mg Subcutaneous QHS Toni Arthurs, MD   40 mg at 02/27/15 2132  . fesoterodine (TOVIAZ) tablet 4 mg  4 mg Oral Daily Toni Arthurs, MD   4 mg at 02/28/15 1032  . HYDROcodone-acetaminophen (NORCO/VICODIN) 5-325 MG per tablet 1-2 tablet  1-2 tablet Oral Q6H PRN Toni Arthurs, MD  2 tablet at 02/28/15 0429  . lisinopril (PRINIVIL,ZESTRIL) tablet 20 mg  20 mg Oral Daily Toni Arthurs, MD   20 mg at 02/28/15 1034  . magic mouthwash w/lidocaine  5 mL Oral TID PRN Jacinta Shoe, PA-C   5 mL at 02/27/15 1659  . magnesium citrate solution 1 Bottle  1 Bottle Oral Once PRN Toni Arthurs, MD      . morphine 2 MG/ML injection 0.5 mg  0.5 mg Intravenous Q2H PRN Toni Arthurs, MD   0.5 mg at 02/28/15 0012  . ondansetron (ZOFRAN) injection 4 mg  4 mg Intravenous Q6H PRN Toni Arthurs, MD      . pantoprazole (PROTONIX) EC tablet 40 mg  40 mg Oral Daily Toni Arthurs, MD   40 mg at 02/28/15 1037  . polyethylene glycol (MIRALAX / GLYCOLAX) packet 17 g  17 g Oral Daily PRN Toni Arthurs, MD      . Gwyndolyn Kaufman [facility]) tablet 8.6 mg  1 tablet Oral BID Toni Arthurs, MD   8.6 mg at 02/28/15 1034  . sertraline (ZOLOFT)  tablet 100 mg  100 mg Oral Daily Toni Arthurs, MD   100 mg at 02/27/15 0814  . traZODone (DESYREL) tablet 50 mg  50 mg Oral QHS Toni Arthurs, MD   50 mg at 02/27/15 2132     Discharge Medications: Please see discharge summary for a list of discharge medications.  Relevant Imaging Results:  Relevant Lab Results:   Additional Information  (SSN: [id number])  Orson Gear, Student-SW [phone]

## 2015-02-28 NOTE — Clinical Social Work Placement (Signed)
   CLINICAL SOCIAL WORK PLACEMENT  NOTE  Date:  02/28/2015  Patient Details  Name: Phillip Butler MRN: [id number] Date of Birth: 1960/04/05  Clinical Social Work is seeking post-discharge placement for this patient at the Skilled  Nursing Facility level of care (*CSW will initial, date and re-position this form in  chart as items are completed):  Yes   Patient/family provided with [facility] Clinical Social Work Department's list of facilities offering this level of care within the geographic area requested by the patient (or if unable, by the patient's family).  Yes   Patient/family informed of their freedom to choose among providers that offer the needed level of care, that participate in Medicare, Medicaid or managed care program needed by the patient, have an available bed and are willing to accept the patient.  Yes   Patient/family informed of [facility]'s ownership interest in [facility] and [facility], as well as of the fact that they are under no obligation to receive care at these facilities.  PASRR submitted to EDS on       PASRR number received on       Existing PASRR number confirmed on       FL2 transmitted to all facilities in geographic area requested by pt/family on 02/28/15     FL2 transmitted to all facilities within larger geographic area on       Patient informed that his/her managed care company has contracts with or will negotiate with certain facilities, including the following:            Patient/family informed of bed offers received.  Patient chooses bed at       Physician recommends and patient chooses bed at      Patient to be transferred to   on  .  Patient to be transferred to facility by       Patient family notified on   of transfer.  Name of family member notified:        PHYSICIAN Please sign FL2     Additional Comment:    _______________________________________________ Orson Gear, Student-SW 02/28/2015, 2:46 PM

## 2015-02-28 NOTE — Progress Notes (Signed)
*  Preliminary Results* Left lower extremity venous duplex completed. Study was technically limited due to surgical bandaging and hardware. Left lower extremity is positive for acute deep vein thrombosis involving the proximal posterior tibial vein. There is no evidence of left Baker's cyst.  Preliminary results discussed with Selena Batten, RN.  02/28/2015 11:11 AM  [name], RVT, RDCS, RDMS

## 2015-02-28 NOTE — Clinical Social Work Note (Signed)
BSW intern has attempted to complete assessment. Patient is disoriented. BSW intern later attempted to contact family to discuss discharge planning. However, number listed on chart was disconnected.  BSW intern remains available  Orson Gear BSW intern [phone]

## 2015-02-28 NOTE — Progress Notes (Signed)
ANTICOAGULATION CONSULT NOTE - Initial Consult  Pharmacy Consult for heparin Indication: DVT  Allergies  Allergen Reactions  . Fluoxetine Hcl     REACTION: ineffective    Patient Measurements: Height:  (175.3 cm) Weight: 265 lb 4.8 oz (120.339 kg) IBW/kg (Calculated) : 70.7 Heparin Dosing Weight: 98 kg  Vital Signs: Temp: 99.7 F (37.6 C) (02/07 0600) BP: 155/101 mmHg (02/07 1034) Pulse Rate: 102 (02/07 0600)  Labs:  Recent Labs  02/26/15 2209 02/26/15 2218  HGB 12.8* 14.3  HCT 38.6* 42.0  PLT 266  --   CREATININE 0.90 0.80    Estimated Creatinine Clearance: 133.6 mL/min (by C-G formula based on Cr of 0.8).  Assessment: 55 yo male presenting s/p fall - ankle fracture. LLE is + for DVT  PMH: autism, HTN, anxiety  AC: none PTA. Heparin until surgery then xarelto post-surgery  Renal: SCr 0.8  Heme: H&H 14.3/42, Plt 266  Goal of Therapy:  Heparin level 0.3-0.7 units/ml Monitor platelets by anticoagulation protocol: Yes   Plan:  Heparin 6500 unit bolus followed by an infusion of 1600 units/hr Initial HL at 0000 Daily HL, CBC Will f/u with ortho preference for time of d/c heparin prior to surgery Thursday Will initiate rivaroxaban 15 mg BID x 21 days 24 hrs after surgery for DVT treatment followed by 20 mg daily  Isaac Bliss, PharmD, BCPS, [facility] Clinical Pharmacist Pager [phone] 02/28/2015 5:44 PM

## 2015-02-28 NOTE — Consult Note (Signed)
[facility] Medical Consultation  Phillip Butler YNW:[id number] DOB: September 27, 1960 DOA: 02/26/2015 PCP: Oliver Barre, MD   Requesting physician: Dr Victorino Dike Date of consultation: 02/28/15 Reason for consultation: anticoagulation  Impression/Recommendations Active Problems:   Fracture of ankle, trimalleolar, right, closed   Closed trimalleolar fracture of left ankle    L Calf DVT: likely developed from vascular injury and congestion from initial injury on 02/26/15.  - Start heparin per pharmacy. DC in time for surgery on 03/02/15. Discussed w/ pharmacy. - 24hrs postop will start pt on Xarelto  BID. Discussed anticoagulation options w/ pts brother in law and HPOA who agrees w/ use of Xarelto. Pt will need to continue Xarelto for 3 mo. (11mo if continues to struggle w/ immobility or [place] confirms continuation of clot)  L trimalleolar fracture: pt presented to ED on 02/26/15: Primary problem, managed by orthopedics, Dr. Victorino Dike. Planning surgical repair on 03/02/15. Discussed disposition w/ Brother in law who stated that he would prefer SNF placement for rehab.  - f/u Ortho plan and treatment  Autism: pt requiring medical decision making by another adult individual. Lives w/ and cared for by brother in law, Mr Regan Rakers [phone].    I will followup again tomorrow. Please contact me if I can be of assistance in the meanwhile. Thank you for this consultation.  Chief Complaint: L ankle pain and LE swelling   HPI:  Ambulating in backyard on 02/26/15 when pt fell sustaining L ankle fracture. Immediately painful condition is constant. Relieved by rest and medications. Pt w/ progressive LE swelling. LE dopplers on 02/27/15 noted a DVT adn started on lovenox. Denies CP, SOB, fevers, N/v, HA.   History obtained by primary team, pt, and pts sister.   Review of Systems:  Unabloe to obtain furthe rdue to autistic state.   Past Medical History  Diagnosis Date  . Depression   . GERD (gastroesophageal  reflux disease)   . Hyperlipidemia   . Anxiety   . Hypertension   . Allergic rhinitis   . OSA (obstructive sleep apnea)   . 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 Anemia of chronic disease   . Autism    History reviewed. No pertinent past surgical history. Social History:  reports that he has never smoked. He does not have any smokeless tobacco history on file. He reports that he does not drink alcohol or use illicit drugs.  Allergies  Allergen Reactions  . Fluoxetine Hcl     REACTION: ineffective   Family History  Problem Relation Age of Onset  . Alcohol abuse    . Arthritis    . Breast cancer    . Prostate cancer    . Hyperlipidemia    . Heart disease    . Stroke    . Kidney disease    . Diabetes    . Pancreatic cancer    . Heart disease Father     Prior to Admission medications   Medication Sig Start Date End Date Taking? Authorizing Provider  aspirin 81 MG EC tablet Take 1 tablet (81 mg total) by mouth daily. 09/25/11  Yes Corwin Levins, MD  fesoterodine (TOVIAZ) 4 MG TB24 tablet Take 1 tablet (4 mg total) by mouth daily. 03/03/14  Yes Corwin Levins, MD  lisinopril (PRINIVIL,ZESTRIL) 20 MG tablet Take 1 tablet (20 mg total) by mouth daily. 03/03/14  Yes Corwin Levins, MD  omeprazole (PRILOSEC) 20 MG capsule Take 1 capsule (20 mg total) by mouth daily. 03/03/14  Yes Fayrene Fearing  Ellin Mayhew, MD  sertraline (ZOLOFT) 100 MG tablet Take 1 tablet (100 mg total) by mouth daily. 03/03/14  Yes Corwin Levins, MD  traZODone (DESYREL) 50 MG tablet TAKE 1 TABLET BY MOUTH AT BEDTIME Patient taking differently: Take 50 mg by mouth at bedtime.  02/09/13  Yes Corwin Levins, MD  guaiFENesin-dextromethorphan (ROBITUSSIN DM) 100-10 MG/5ML syrup Take 5 mLs by mouth every 4 (four) hours as needed for cough. Patient not taking: Reported on 02/26/2015 02/22/14   Maryruth Bun Rama, MD   Physical Exam: Blood pressure 155/101, pulse 102, temperature 99.7 F (37.6 C), temperature source Oral, resp. rate 20, height   (1.753 m), weight 120.339 kg (265 lb 4.8 oz), SpO2 90 %. Filed Vitals:   02/28/15 0600 02/28/15 1034  BP: 155/101 155/101  Pulse: 102   Temp: 99.7 F (37.6 C)   Resp:       General:  NAD, pleasant  Eyes: EOMI, PERRL  ENT: mmm, nml  Neck: FROM, no thyromegaly  Cardiovascular: RRR, no M/R/G  Respiratory: CTAB, nml effort  Abdomen: obese, NABS, nonttp  Skin: no rash, good skin turgor   Musculoskeletal: L ankle wrapped and immobilized, LE edema noted  Psychiatric: answers questions appropriately though simply. Unable to make significant medical decisions, follows basic commands  Neurologic: CN 2-12 grossly intact, moves all extremities in coordinated fashion  Labs on Admission:  Basic Metabolic Panel:  Recent Labs Lab 02/26/15 2209 02/26/15 2218  NA  --  141  K  --  4.5  CL  --  105  GLUCOSE  --  154*  BUN  --  24*  CREATININE 0.90 0.80   Liver Function Tests: No results for input(s): AST, ALT, ALKPHOS, BILITOT, PROT, ALBUMIN in the last 168 hours. No results for input(s): LIPASE, AMYLASE in the last 168 hours. No results for input(s): AMMONIA in the last 168 hours. CBC:  Recent Labs Lab 02/26/15 2209 02/26/15 2218  WBC 9.9  --   NEUTROABS 8.3*  --   HGB 12.8* 14.3  HCT 38.6* 42.0  MCV 88.3  --   PLT 266  --    Cardiac Enzymes: No results for input(s): CKTOTAL, CKMB, CKMBINDEX, TROPONINI in the last 168 hours. BNP: Invalid input(s): POCBNP CBG: No results for input(s): GLUCAP in the last 168 hours.  Radiological Exams on Admission: Dg Ankle Complete Left  02/26/2015  CLINICAL DATA:  Slipped and fell in the back yard today. Left foot and ankle pain. EXAM: LEFT ANKLE COMPLETE - 3+ VIEW COMPARISON:  None. FINDINGS: There is a trimalleolar fracture dislocation. The talar dome is dislocated laterally and posteriorly relative to the tibial plafond. There is a displaced distal fibular metaphysis fracture with 11 mm of lateral displacement. There is a  displaced and rotated medial malleolar fracture with approximately 10 mm of displacement. There is severe surrounding soft tissue swelling. IMPRESSION: Trimalleolar left ankle fracture and dislocation. Electronically Signed   By: Elige Ko   On: 02/26/2015 18:28   Dg Ankle Left Port  02/26/2015  CLINICAL DATA:  55 year old male status post reduction of the left ankle fracture dislocation. EXAM: PORTABLE LEFT ANKLE - 2 VIEW COMPARISON:  Earlier radiograph dated 02/26/2015 FINDINGS: Evaluation is limited due to overlying cast. There has been interval reduction of the previously seen trimalleolar fracture dislocation of the left ankle. IMPRESSION: Interval reduction of the previously seen left ankle fracture/dislocation. Electronically Signed   By: Elgie Collard M.D.   On: 02/26/2015 20:17   Dg  Foot Complete Left  02/26/2015  CLINICAL DATA:  Fall. EXAM: LEFT FOOT - COMPLETE 3+ VIEW COMPARISON:  None. FINDINGS: Fracture dislocation of the ankle.  See separate ankle report No other fracture in the foot. Mild degenerative change in the first MTP. IMPRESSION: Fracture dislocation of the ankle. Electronically Signed   By: Marlan Palau M.D.   On: 02/26/2015 18:29   EKG independently reviewed: no signs of ACS, sinus, tachy.     Kathryn Cosby J [facility]   If 7PM-7AM, please contact night-coverage [email or web address] Password [facility] 02/28/2015, 4:58 PM

## 2015-02-28 NOTE — Progress Notes (Signed)
Responded to spiritual consult to pray with patient prior to surgery. Patient sister at bedside and expecting for family later.  I prayed with patient and family.  Patient going to surgery on Thursday.  Will past on to unit chaplain for follow up support as needed.   02/28/15 1700  Clinical Encounter Type  Visited With Patient and family together;Health care provider  Visit Type Initial;Spiritual support;Pre-op  Referral From Nurse  Spiritual Encounters  Spiritual Needs Prayer;Emotional  Stress Factors  Patient Stress Factors None identified  Family Stress Factors Family relationships  Venida Jarvis, Chaplain,pager [phone]

## 2015-02-28 NOTE — Evaluation (Signed)
Occupational Therapy Evaluation Patient Details Name: Phillip Butler MRN: [id number] DOB: 01/06/1961 Today's Date: 02/28/2015    History of Present Illness 55 yo with PMHx of autism, depression, GERD, hyperlipidemia, anxiety, HTN who fell at home with left ankle fx s/p closed reduction and splinting   Clinical Impression   This 55 yo male admitted with above presents to acute OT with deficits below affecting his safety and independence with basic ADLs as he was pta. He will benefit from acute OT with follow up OT at SNF to get to a S level or better. It was noted prior to seeing pt that he was diagnosed with a new DVT in LLE involving the proximal posterior tibial vein today. Pt has been on lovenox since admission 02/26/2015 and I spoke to his nurse, Selena Batten who stated that there is not plan to do anything more than lovenox at this time so did see pt for eval.    Follow Up Recommendations  SNF    Equipment Recommendations   (TBD at next venue)       Precautions / Restrictions Precautions Precautions: Fall Restrictions Weight Bearing Restrictions: Yes LLE Weight Bearing: Non weight bearing      Mobility Bed Mobility Overal bed mobility: Modified Independent             General bed mobility comments: with rail and increased time  Transfers Overall transfer level: Needs assistance Equipment used: Rolling walker (2 wheeled) Transfers: Sit to/from Stand Sit to Stand: Min assist         General transfer comment: cues for hand placement     Balance Overall balance assessment: Needs assistance Sitting-balance support: No upper extremity supported;Feet unsupported Sitting balance-Leahy Scale: Good     Standing balance support: During functional activity;Bilateral upper extremity supported Standing balance-Leahy Scale: Poor Standing balance comment: reliant on RW                            ADL Overall ADL's : Needs assistance/impaired Eating/Feeding:  Independent;Sitting   Grooming: Set up;Sitting   Upper Body Bathing: Set up;Sitting   Lower Body Bathing: Moderate assistance (min A sit<>stand)   Upper Body Dressing : Set up;Sitting   Lower Body Dressing: Sit to/from stand;Moderate assistance (min A sit<>stand)   Toilet Transfer: Minimal assistance;Stand-pivot;RW (hopping , bed>recliner 2 feet away)   Toileting- Clothing Manipulation and Hygiene: Moderate assistance (min A sit<>stand)                         Pertinent Vitals/Pain Pain Assessment: Faces Faces Pain Scale: Hurts little more Pain Location: tooth ache on left  Pain Descriptors / Indicators: Aching Pain Intervention(s): Monitored during session;Patient requesting pain meds-RN notified     Hand Dominance Right      Communication Communication Communication:  (dysarthric speech--hard to understand at times, h/o autisim)   Cognition Arousal/Alertness: Awake/alert Behavior During Therapy: Flat affect Overall Cognitive Status: No family/caregiver present to determine baseline cognitive functioning (does have a history of cognitive impairments (autistic))                                Home Living Family/patient expects to be discharged to:: Private residence Living Arrangements: Alone Available Help at Discharge: Family;Available PRN/intermittently Type of Home: House Home Access: Stairs to enter Entrance Stairs-Number of Steps: 3   Home Layout: One level  Home Equipment: None          Prior Functioning/Environment Level of Independence: Independent        Comments: pt does not drive    OT Diagnosis: Generalized weakness;Acute pain;Cognitive deficits   OT Problem List: Decreased strength;Decreased range of motion;Decreased activity tolerance;Impaired balance (sitting and/or standing);Pain;Decreased safety awareness;Obesity;Decreased knowledge of use of DME or AE   OT Treatment/Interventions: Self-care/ADL  training;Patient/family education;Balance training;Therapeutic activities;DME and/or AE instruction    OT Goals(Current goals can be found in the care plan section) Acute Rehab OT Goals Patient Stated Goal: to be able to go home OT Goal Formulation: With patient Time For Goal Achievement: 03/07/15 Potential to Achieve Goals: Good  OT Frequency: Min 2X/week   Barriers to D/C: Decreased caregiver support             End of Session Equipment Utilized During Treatment: Gait belt;Rolling walker Nurse Communication:  (BP  175/99, O2 sats on RA 94% and HR 118 after getting from bed>recliner; NT-I emptied 250ccs of urine)  Activity Tolerance: Patient limited by fatigue (DOE) Patient left: in chair;with call bell/phone within reach;with chair alarm set   Time: 1500-1515 OT Time Calculation (min): 15 min Charges:  OT General Charges $OT Visit: 1 Procedure OT Evaluation $OT Eval Moderate Complexity: 1 Procedure   Evette Georges [phone] 02/28/2015, 3:29 PM

## 2015-02-28 NOTE — Progress Notes (Signed)
Physical Therapy Treatment Patient Details Name: Phillip Butler MRN: [id number] DOB: [birth year] Today's Date: 03-Mar-2015    History of Present Illness 55 yo with PMHx of autism, depression, GERD, hyperlipidemia, anxiety, HTN who fell at home with left ankle fx s/p closed reduction and splinting    PT Comments    Pt pleasant and willing to walk. Pt maintains NWB well but fatigues quickly with activity. Pt educated for bil LE HEP and encouraged to perform throughout the day to maintain strength and independence. Will continue to follow.  Follow Up Recommendations  SNF;Supervision/Assistance - 24 hour     Equipment Recommendations  Rolling walker with 5" wheels;Wheelchair (measurements PT);3in1 (PT)    Recommendations for Other Services       Precautions / Restrictions Precautions Precautions: Fall Restrictions LLE Weight Bearing: Non weight bearing    Mobility  Bed Mobility Overal bed mobility: Modified Independent                Transfers Overall transfer level: Needs assistance   Transfers: Sit to/from Stand Sit to Stand: Min guard         General transfer comment: cues for hand placement and safety x2 trials  Ambulation/Gait Ambulation/Gait assistance: Min assist Ambulation Distance (Feet): 25 Feet Assistive device: Rolling walker (2 wheeled) Gait Pattern/deviations: Step-to pattern   Gait velocity interpretation: Below normal speed for age/gender General Gait Details: cues for posture, position in RW and safety with chair to follow, pt does not state fatigue and has to be asked for need to sit. 25' then12' gait trials with seated rest between   Stairs            Wheelchair Mobility    Modified Rankin (Stroke Patients Only)       Balance                                    Cognition Arousal/Alertness: Awake/alert Behavior During Therapy: Flat affect Overall Cognitive Status: No family/caregiver present to determine  baseline cognitive functioning                      Exercises General Exercises - Lower Extremity Long Arc Quad: AROM;Seated;Both;15 reps Hip ABduction/ADduction: AROM;Seated;Both;15 reps Hip Flexion/Marching: AROM;Seated;Both;15 reps    General Comments        Pertinent Vitals/Pain Pain Assessment: 0-10 Pain Location: toothe, unable to rate Pain Descriptors / Indicators: Aching Pain Intervention(s): Repositioned;Monitored during session    Home Living                      Prior Function            PT Goals (current goals can now be found in the care plan section) Progress towards PT goals: Progressing toward goals    Frequency       PT Plan Current plan remains appropriate    Co-evaluation             End of Session Equipment Utilized During Treatment: Gait belt Activity Tolerance: Patient tolerated treatment well Patient left: in chair;with call bell/phone within reach;with chair alarm set     Time: 7829-5621 PT Time Calculation (min) (ACUTE ONLY): 17 min  Charges:  $Gait Training: 8-22 mins                    G Codes:      Delorse Lek Mar 03, 2015,  10:18 AM Delaney Meigs, PT [phone]

## 2015-02-28 NOTE — Progress Notes (Signed)
Utilization review completed. Lauryn Lizardi, RN, BSN. 

## 2015-02-28 NOTE — Progress Notes (Signed)
Vascular called to state pt has a DVT in left lower leg, dx discussed with [name]. Phillip Butler

## 2015-02-28 NOTE — Progress Notes (Addendum)
Subjective: Pt c/o pain in his ankle that is mild in severity.  Per RN he is also having some tooth pain. [place] positive for L calf DVT.  Pt denies CP or SOB.  Objective: Vital signs in last 24 hours: Temp:  [97.3 F (36.3 C)-100.2 F (37.9 C)] 99.7 F (37.6 C) (02/07 0600) Pulse Rate:  [102-138] 102 (02/07 0600) Resp:  [16-20] 20 (02/06 2041) BP: (112-155)/(62-101) 155/101 mmHg (02/07 1034) SpO2:  [88 %-94 %] 90 % (02/07 0600)  Intake/Output from previous day: 02/06 0701 - 02/07 0700 In: 2051.3 [P.O.:960; I.V.:591.3; IV Piggyback:500] Out: 775 [Urine:775] Intake/Output this shift:     Recent Labs  02/26/15 2209 02/26/15 2218  HGB 12.8* 14.3    Recent Labs  02/26/15 2209 02/26/15 2218  WBC 9.9  --   RBC 4.37  --   HCT 38.6* 42.0  PLT 266  --     Recent Labs  02/26/15 2209 02/26/15 2218  NA  --  141  K  --  4.5  CL  --  105  BUN  --  24*  CREATININE 0.90 0.80  GLUCOSE  --  154*   No results for input(s): LABPT, INR in the last 72 hours.  PE:  wn wd male in nad.  L ankle splinted.  flat affect.  wiggle stoes.  feels LT at toes.  Assessment/Plan: L ankle trimal fracture - to OR Thursday for ORIF.  Consent from family.  Continue elvation and NWB.  Hospitalist consult to for assistance in managing DVT.   Toni Arthurs 02/28/2015, 4:32 PM

## 2015-02-28 NOTE — Progress Notes (Signed)
Per Dr Victorino Dike (verbal order) elevated left lower leg and apply warm compress, Consents have all ready been signed by his sister

## 2015-03-01 ENCOUNTER — Encounter (HOSPITAL_COMMUNITY): Payer: Self-pay | Admitting: General Practice

## 2015-03-01 DIAGNOSIS — I824Z2 Acute embolism and thrombosis of unspecified deep veins of left distal lower extremity: Secondary | ICD-10-CM

## 2015-03-01 DIAGNOSIS — S82852K Displaced trimalleolar fracture of left lower leg, subsequent encounter for closed fracture with nonunion: Secondary | ICD-10-CM

## 2015-03-01 LAB — HEPARIN LEVEL (UNFRACTIONATED)
HEPARIN UNFRACTIONATED: 0.15 [IU]/mL — AB (ref 0.30–0.70)
HEPARIN UNFRACTIONATED: 0.48 [IU]/mL (ref 0.30–0.70)
HEPARIN UNFRACTIONATED: 0.7 [IU]/mL (ref 0.30–0.70)

## 2015-03-01 LAB — CBC
HEMATOCRIT: 34.2 % — AB (ref 39.0–52.0)
Hemoglobin: 11 g/dL — ABNORMAL LOW (ref 13.0–17.0)
MCH: 29.7 pg (ref 26.0–34.0)
MCHC: 32.2 g/dL (ref 30.0–36.0)
MCV: 92.4 fL (ref 78.0–100.0)
PLATELETS: 225 10*3/uL (ref 150–400)
RBC: 3.7 MIL/uL — ABNORMAL LOW (ref 4.22–5.81)
RDW: 13.7 % (ref 11.5–15.5)
WBC: 6.6 10*3/uL (ref 4.0–10.5)

## 2015-03-01 MED ORDER — HEPARIN BOLUS VIA INFUSION
3000.0000 [IU] | Freq: Once | INTRAVENOUS | Status: AC
  Administered 2015-03-01: 3000 [IU] via INTRAVENOUS
  Filled 2015-03-01: qty 3000

## 2015-03-01 NOTE — Care Management Important Message (Signed)
Important Message  Patient Details  Name: Phillip Butler MRN: [id number] Date of Birth: February 15, 1960   Medicare Important Message Given:  Yes    [name] 03/01/2015, 1:46 PMImportant Message  Patient Details  Name: Phillip Butler MRN: [id number] Date of Birth: 01/16/1961   Medicare Important Message Given:  Yes    [name] G 03/01/2015, 1:46 PM

## 2015-03-01 NOTE — Progress Notes (Signed)
ANTICOAGULATION CONSULT NOTE - Follow-up Consult  Pharmacy Consult for heparin Indication: DVT  Allergies  Allergen Reactions  . Fluoxetine Hcl     REACTION: ineffective    Patient Measurements: Height:  (175.3 cm) Weight: 265 lb 4.8 oz (120.339 kg) IBW/kg (Calculated) : 70.7 Heparin Dosing Weight: 98 kg  Vital Signs: Temp: 97.7 F (36.5 C) (02/08 0527) Temp Source: Oral (02/08 0527) BP: 151/92 mmHg (02/08 0527) Pulse Rate: 94 (02/08 0527)  Labs:  Recent Labs  02/26/15 2209 02/26/15 2218 02/28/15 2348 03/01/15 0637  HGB 12.8* 14.3  --  11.0*  HCT 38.6* 42.0  --  34.2*  PLT 266  --   --  225  HEPARINUNFRC  --   --  0.15* 0.70  CREATININE 0.90 0.80  --   --     Estimated Creatinine Clearance: 133.6 mL/min (by C-G formula based on Cr of 0.8).  Assessment: 55 yo male presenting s/p fall - ankle fracture. LLE is + for DVT. Plan for heparin until surgery then Xarelto 24 hours post-surgery.   HL therapeutic after bolus/rate increase (0.7) but at top of range. Hg 11 (down), plt wnl, no bleed/IV line issues per RN.  Goal of Therapy:  Heparin level 0.3-0.7 units/ml Monitor platelets by anticoagulation protocol: Yes   Plan:  Decrease heparin slightly to 1850 units/h to keep in range 6h HL, Daily HL, CBC FM consult to stop heparin in time for surgery 2/9 at 1445 - f/u with ortho on Wed for their preference Start rivaroxaban 24 hrs after surgery for DVT treatment  Babs Bertin, PharmD, [facility] Clinical Pharmacist Pager [phone] 03/01/2015 8:28 AM

## 2015-03-01 NOTE — Progress Notes (Signed)
ANTICOAGULATION CONSULT NOTE - Follow-up Consult  Pharmacy Consult for heparin Indication: DVT  Allergies  Allergen Reactions  . Fluoxetine Hcl     REACTION: ineffective    Patient Measurements: Height:  (175.3 cm) Weight: 265 lb 4.8 oz (120.339 kg) IBW/kg (Calculated) : 70.7 Heparin Dosing Weight: 98 kg  Vital Signs: Temp: 97.8 F (36.6 C) (02/07 2013) Temp Source: Oral (02/07 2013) BP: 109/69 mmHg (02/07 2013) Pulse Rate: 119 (02/07 2013)  Labs:  Recent Labs  02/26/15 2209 02/26/15 2218 02/28/15 2348  HGB 12.8* 14.3  --   HCT 38.6* 42.0  --   PLT 266  --   --   HEPARINUNFRC  --   --  0.15*  CREATININE 0.90 0.80  --     Estimated Creatinine Clearance: 133.6 mL/min (by C-G formula based on Cr of 0.8).  Assessment: 55 yo male presenting s/p fall - ankle fracture. LLE is + for DVT. Plan for heparin until surgery then Xarelto post-surgery. Heparin level 0.15 (subtherapeutic) on 1600 units/hr. No issues with line or bleeding reported per RN.  Goal of Therapy:  Heparin level 0.3-0.7 units/ml Monitor platelets by anticoagulation protocol: Yes   Plan:  Rebolus heparin 3000 units and increase infusion to 1900 units/hr Heparin level in 6 hours  Christoper Fabian, PharmD, BCPS Clinical pharmacist, pager [phone] 03/01/2015 12:16 AM

## 2015-03-01 NOTE — Progress Notes (Signed)
PROGRESS NOTE  Phillip Butler ZOX:[id number] DOB: [birth year] DOA: 02/26/2015 PCP: Oliver Barre, MD  HPI/Recap of past 76 hours: 55 year old male with past oral history of obstructive sleep apnea, autism and hypertension admitted on 2/5 after being unable to bear weight after a fall on his left foot and found to have a left ankle trimalleolar fracture.during workup, patient found to have left calf DVT. Hospitalist consulted.  Patient started on IV heparin prior to surgery which is planned for 2/9.  Today, patient doing okay. Limited response and affect secondary to autism. Not great historian. No complaints.  Assessment/Plan: Active Problems:   Fracture of ankle, trimalleolar, right, closed:for surgery tomorrow    Autism   DVT, lower extremity, distal, acute (HCC): currently on heparin with plans for xarelto postop   Code Status: full code   Family Communication: left message for patient's brother who is caretaker   Disposition Plan: potential skilled nursing versus inpatient rehabilitation, awaiting PT eval after surgery    Consultants:  hospitalists  Procedures:  Planned ankle repair 2/9   Antibiotics:  none   Objective: BP 152/78 mmHg  Pulse 111  Temp(Src) 98.1 F (36.7 C) (Oral)  Resp 20  Ht  (1.753 m)  Wt 120.339 kg (265 lb 4.8 oz)  BMI 39.16 kg/m2  SpO2 98%  Intake/Output Summary (Last 24 hours) at 03/01/15 1550 Last data filed at 03/01/15 1300  Gross per 24 hour  Intake    475 ml  Output   1050 ml  Net   -575 ml   Filed Weights   02/26/15 1750 02/27/15 0108  Weight: 107.502 kg (237 lb) 120.339 kg (265 lb 4.8 oz)    Exam:   General:  Alert and oriented 2, no acute distress   Cardiovascular: regular rate and rhythm, S1-S2   Respiratory: clear to auscultation bilaterally   Abdomen: soft, obese, nontender, positive bowel sounds   Musculoskeletal: no clubbing or cyanosis, trace edema    Data Reviewed: Basic Metabolic Panel:  Recent  Labs Lab 02/26/15 2209 02/26/15 2218  NA  --  141  K  --  4.5  CL  --  105  GLUCOSE  --  154*  BUN  --  24*  CREATININE 0.90 0.80   Liver Function Tests: No results for input(s): AST, ALT, ALKPHOS, BILITOT, PROT, ALBUMIN in the last 168 hours. No results for input(s): LIPASE, AMYLASE in the last 168 hours. No results for input(s): AMMONIA in the last 168 hours. CBC:  Recent Labs Lab 02/26/15 2209 02/26/15 2218 03/01/15 0637  WBC 9.9  --  6.6  NEUTROABS 8.3*  --   --   HGB 12.8* 14.3 11.0*  HCT 38.6* 42.0 34.2*  MCV 88.3  --  92.4  PLT 266  --  225   Cardiac Enzymes:   No results for input(s): CKTOTAL, CKMB, CKMBINDEX, TROPONINI in the last 168 hours. BNP (last 3 results) No results for input(s): BNP in the last 8760 hours.  ProBNP (last 3 results) No results for input(s): PROBNP in the last 8760 hours.  CBG: No results for input(s): GLUCAP in the last 168 hours.  Recent Results (from the past 240 hour(s))  Surgical pcr screen     Status: None   Collection Time: 02/27/15 10:40 AM  Result Value Ref Range Status   MRSA, PCR NEGATIVE NEGATIVE Final   Staphylococcus aureus NEGATIVE NEGATIVE Final    Comment:        The Xpert SA Assay (FDA approved  for NASAL specimens in patients over 82 years of age), is one component of a comprehensive surveillance program.  Test performance has been validated by [facility] for patients greater than or equal to 54 year old. It is not intended to diagnose infection nor to guide or monitor treatment.      Studies: No results found.  Scheduled Meds: . [START ON 03/02/2015]  ceFAZolin (ANCEF) IV  3 g Intravenous To SS-Surg  . [START ON 03/02/2015] chlorhexidine  60 mL Topical Once  . docusate sodium  100 mg Oral BID  . fesoterodine  4 mg Oral Daily  . lisinopril  20 mg Oral Daily  . pantoprazole  40 mg Oral Daily  . senna  1 tablet Oral BID  . sertraline  100 mg Oral Daily  . traZODone  50 mg Oral QHS    Continuous  Infusions: . heparin 1,850 Units/hr (03/01/15 0831)     Time spent: 15 minutes   Hollice Espy  [facility] Pager [phone] . If 7PM-7AM, please contact night-coverage at [email or web address], password [facility] 03/01/2015, 3:50 PM  LOS: 3 days

## 2015-03-01 NOTE — Progress Notes (Addendum)
ANTICOAGULATION CONSULT NOTE - Follow-up Consult  Pharmacy Consult for heparin Indication: DVT  Allergies  Allergen Reactions  . Fluoxetine Hcl     REACTION: ineffective    Patient Measurements: Height:  (175.3 cm) Weight: 265 lb 4.8 oz (120.339 kg) IBW/kg (Calculated) : 70.7 Heparin Dosing Weight: 98 kg  Vital Signs: Temp: 98.1 F (36.7 C) (02/08 1423) Temp Source: Oral (02/08 1423) BP: 152/78 mmHg (02/08 1423) Pulse Rate: 111 (02/08 1423)  Labs:  Recent Labs  02/26/15 2209 02/26/15 2218 02/28/15 2348 03/01/15 0637 03/01/15 1505  HGB 12.8* 14.3  --  11.0*  --   HCT 38.6* 42.0  --  34.2*  --   PLT 266  --   --  225  --   HEPARINUNFRC  --   --  0.15* 0.70 0.48  CREATININE 0.90 0.80  --   --   --     Estimated Creatinine Clearance: 133.6 mL/min (by C-G formula based on Cr of 0.8).  Assessment: 55 yo male presenting s/p fall - ankle fracture. LLE is + for DVT. Plan for heparin until surgery then Xarelto 24 hours post-surgery.   HL therapeutic x2  Hg 11 (down), plt wnl, no bleed/IV line issues per RN.  Goal of Therapy:  Heparin level 0.3-0.7 units/ml Monitor platelets by anticoagulation protocol: Yes   Plan:  Continue heparin at 1850 units/h Daily HL, CBC Off at 0600 for surgery Start rivaroxaban 24 hrs after surgery for DVT treatment  Isaac Bliss, PharmD, BCPS, [facility] Clinical Pharmacist Pager [phone] 03/01/2015 4:31 PM     ======================   Addendum: - HL this AM is therapeutic; no bleeding reported.  Will f/u post surgery to ensure there is no unexpected complication prior to starting Xarelto on 2/10.  D/C heparin consult.    Trestan Vahle D. Laney Potash, PharmD, BCPS Pager:  [phone] 03/02/2015, 10:31 AM

## 2015-03-01 NOTE — Progress Notes (Signed)
Subjective:   Procedure(s) (LRB): OPEN REDUCTION INTERNAL FIXATION (ORIF) LEFT ANKLE TRIMALLEOLAR FRACTURE (Left)  Patient reports pain as mild to moderate.  Continues to c/o of mild L calf soreness.  Tolerating POs well. Admits to BM.  Objective:   VITALS:  Temp:  [97.7 F (36.5 C)-97.8 F (36.6 C)] 97.7 F (36.5 C) (02/08 0527) Pulse Rate:  [94-119] 94 (02/08 0527) Resp:  [18-20] 20 (02/08 0527) BP: (109-155)/(69-101) 151/92 mmHg (02/08 0527) SpO2:  [88 %-96 %] 96 % (02/08 0527)  General: WDWN patient in NAD. Psych:  Appropriate mood and affect. Neuro:  A&O x 3, Moving all extremities, sensation intact to light touch HEENT:  EOMs intact Chest:  Even non-labored respirations Skin:  Dressing/posterior splint C/D/I, no rashes or lesions Extremities: warm/dry, no edema, erythmea, or echymosis.  No lymphadenopathy. Pulses: Popliteus 2+ MSK:  ROM: EHL/FHL intact, MMT: patient can perform quad set    LABS  Recent Labs  02/26/15 2209 02/26/15 2218  HGB 12.8* 14.3  WBC 9.9  --   PLT 266  --     Recent Labs  02/26/15 2209 02/26/15 2218  NA  --  141  K  --  4.5  CL  --  105  BUN  --  24*  CREATININE 0.90 0.80  GLUCOSE  --  154*   No results for input(s): LABPT, INR in the last 72 hours.   Assessment/Plan:   Procedure(s) (LRB): OPEN REDUCTION INTERNAL FIXATION (ORIF) LEFT ANKLE TRIMALLEOLAR FRACTURE (Left)  Up with therapy  Hospitalist and pharmacy consult for DVT management.  Appreciate their involvement. NWB LLE.  Continue elevation. To OR 03/02/15 for ORIF L Trimalleolar fx.  Alfredo Martinez, PA-C, [employer] Office:  [phone]

## 2015-03-02 ENCOUNTER — Inpatient Hospital Stay (HOSPITAL_COMMUNITY): Payer: Medicare Other | Admitting: Certified Registered Nurse Anesthetist

## 2015-03-02 ENCOUNTER — Encounter (HOSPITAL_COMMUNITY)
Admission: EM | Disposition: A | Discharge status: Skilled Nursing Facility | Payer: Self-pay | Source: Home / Self Care | Attending: Orthopedic Surgery

## 2015-03-02 ENCOUNTER — Inpatient Hospital Stay (HOSPITAL_COMMUNITY): Payer: Medicare Other

## 2015-03-02 DIAGNOSIS — S82852A Displaced trimalleolar fracture of left lower leg, initial encounter for closed fracture: Principal | ICD-10-CM

## 2015-03-02 DIAGNOSIS — M79662 Pain in left lower leg: Secondary | ICD-10-CM

## 2015-03-02 DIAGNOSIS — F840 Autistic disorder: Secondary | ICD-10-CM

## 2015-03-02 DIAGNOSIS — E669 Obesity, unspecified: Secondary | ICD-10-CM | POA: Diagnosis present

## 2015-03-02 DIAGNOSIS — I824Z1 Acute embolism and thrombosis of unspecified deep veins of right distal lower extremity: Secondary | ICD-10-CM

## 2015-03-02 HISTORY — PX: ORIF ANKLE FRACTURE: SHX5408

## 2015-03-02 LAB — BLOOD GAS, ARTERIAL
ACID-BASE EXCESS: 0.9 mmol/L (ref 0.0–2.0)
BICARBONATE: 26.5 meq/L — AB (ref 20.0–24.0)
Drawn by: 42624
FIO2: 0.5
Mode: POSITIVE
O2 SAT: 96.5 %
PATIENT TEMPERATURE: 98.6
PCO2 ART: 55 mmHg — AB (ref 35.0–45.0)
PEEP: 5 cmH2O
Pressure control: 7 cmH2O
RATE: 15 resp/min
TCO2: 28.2 mmol/L (ref 0–100)
pH, Arterial: 7.304 — ABNORMAL LOW (ref 7.350–7.450)
pO2, Arterial: 101 mmHg — ABNORMAL HIGH (ref 80.0–100.0)

## 2015-03-02 LAB — PROTIME-INR
INR: 1.05 (ref 0.00–1.49)
Prothrombin Time: 13.9 seconds (ref 11.6–15.2)

## 2015-03-02 LAB — LACTIC ACID, PLASMA: LACTIC ACID, VENOUS: 0.8 mmol/L (ref 0.5–2.0)

## 2015-03-02 LAB — APTT: APTT: 25 s (ref 24–37)

## 2015-03-02 LAB — CBC
HEMATOCRIT: 33.4 % — AB (ref 39.0–52.0)
Hemoglobin: 11 g/dL — ABNORMAL LOW (ref 13.0–17.0)
MCH: 29.6 pg (ref 26.0–34.0)
MCHC: 32.9 g/dL (ref 30.0–36.0)
MCV: 89.8 fL (ref 78.0–100.0)
PLATELETS: 263 10*3/uL (ref 150–400)
RBC: 3.72 MIL/uL — ABNORMAL LOW (ref 4.22–5.81)
RDW: 13.3 % (ref 11.5–15.5)
WBC: 6.3 10*3/uL (ref 4.0–10.5)

## 2015-03-02 LAB — HEPARIN LEVEL (UNFRACTIONATED): HEPARIN UNFRACTIONATED: 0.57 [IU]/mL (ref 0.30–0.70)

## 2015-03-02 SURGERY — OPEN REDUCTION INTERNAL FIXATION (ORIF) ANKLE FRACTURE
Anesthesia: Regional | Site: Ankle | Laterality: Left

## 2015-03-02 MED ORDER — LABETALOL HCL 5 MG/ML IV SOLN
10.0000 mg | INTRAVENOUS | Status: AC | PRN
  Administered 2015-03-02 (×2): 10 mg via INTRAVENOUS

## 2015-03-02 MED ORDER — ALBUMIN HUMAN 5 % IV SOLN
INTRAVENOUS | Status: DC | PRN
  Administered 2015-03-02: 15:00:00 via INTRAVENOUS

## 2015-03-02 MED ORDER — SODIUM CHLORIDE 0.9 % IV SOLN
INTRAVENOUS | Status: DC
  Administered 2015-03-02: 50 mL/h via INTRAVENOUS
  Administered 2015-03-03: via INTRAVENOUS

## 2015-03-02 MED ORDER — LABETALOL HCL 5 MG/ML IV SOLN
10.0000 mg | INTRAVENOUS | Status: AC | PRN
  Administered 2015-03-02 – 2015-03-03 (×2): 10 mg via INTRAVENOUS

## 2015-03-02 MED ORDER — MIDAZOLAM HCL 2 MG/2ML IJ SOLN
INTRAMUSCULAR | Status: AC
  Filled 2015-03-02: qty 2

## 2015-03-02 MED ORDER — FENTANYL CITRATE (PF) 250 MCG/5ML IJ SOLN
INTRAMUSCULAR | Status: AC
  Filled 2015-03-02: qty 5

## 2015-03-02 MED ORDER — LACTATED RINGERS IV SOLN
INTRAVENOUS | Status: DC
Start: 2015-03-02 — End: 2015-03-03
  Administered 2015-03-02 (×2): via INTRAVENOUS

## 2015-03-02 MED ORDER — GLYCOPYRROLATE 0.2 MG/ML IJ SOLN
INTRAMUSCULAR | Status: AC
  Filled 2015-03-02: qty 2

## 2015-03-02 MED ORDER — ARTIFICIAL TEARS OP OINT
TOPICAL_OINTMENT | OPHTHALMIC | Status: AC
  Filled 2015-03-02: qty 3.5

## 2015-03-02 MED ORDER — LIDOCAINE HCL (CARDIAC) 20 MG/ML IV SOLN
INTRAVENOUS | Status: AC
  Filled 2015-03-02: qty 5

## 2015-03-02 MED ORDER — HEPARIN (PORCINE) IN NACL 100-0.45 UNIT/ML-% IJ SOLN
2050.0000 [IU]/h | INTRAMUSCULAR | Status: AC
  Administered 2015-03-03: 2050 [IU]/h via INTRAVENOUS
  Administered 2015-03-03 (×2): 1850 [IU]/h via INTRAVENOUS
  Filled 2015-03-02 (×5): qty 250

## 2015-03-02 MED ORDER — ALBUTEROL SULFATE HFA 108 (90 BASE) MCG/ACT IN AERS
INHALATION_SPRAY | RESPIRATORY_TRACT | Status: DC | PRN
  Administered 2015-03-02: 4 via RESPIRATORY_TRACT
  Administered 2015-03-02: 2 via RESPIRATORY_TRACT

## 2015-03-02 MED ORDER — HYDROMORPHONE HCL 1 MG/ML IJ SOLN
0.2500 mg | INTRAMUSCULAR | Status: DC | PRN
  Administered 2015-03-02 (×4): 0.5 mg via INTRAVENOUS

## 2015-03-02 MED ORDER — HYDROMORPHONE HCL 1 MG/ML IJ SOLN
INTRAMUSCULAR | Status: AC
  Administered 2015-03-02: 0.5 mg via INTRAVENOUS
  Filled 2015-03-02: qty 1

## 2015-03-02 MED ORDER — PROPOFOL 10 MG/ML IV BOLUS
INTRAVENOUS | Status: DC | PRN
  Administered 2015-03-02: 170 mg via INTRAVENOUS
  Administered 2015-03-02: 50 mg via INTRAVENOUS
  Administered 2015-03-02: 30 mg via INTRAVENOUS

## 2015-03-02 MED ORDER — NEOSTIGMINE METHYLSULFATE 10 MG/10ML IV SOLN
INTRAVENOUS | Status: AC
  Filled 2015-03-02: qty 1

## 2015-03-02 MED ORDER — INSULIN ASPART 100 UNIT/ML ~~LOC~~ SOLN
0.0000 [IU] | SUBCUTANEOUS | Status: DC
  Administered 2015-03-05 – 2015-03-06 (×2): 1 [IU] via SUBCUTANEOUS

## 2015-03-02 MED ORDER — LABETALOL HCL 5 MG/ML IV SOLN
INTRAVENOUS | Status: AC
  Administered 2015-03-02: 10 mg via INTRAVENOUS
  Filled 2015-03-02: qty 4

## 2015-03-02 MED ORDER — ONDANSETRON HCL 4 MG/2ML IJ SOLN
INTRAMUSCULAR | Status: DC | PRN
  Administered 2015-03-02: 4 mg via INTRAVENOUS

## 2015-03-02 MED ORDER — EPHEDRINE SULFATE 50 MG/ML IJ SOLN
INTRAMUSCULAR | Status: DC | PRN
  Administered 2015-03-02: 10 mg via INTRAVENOUS

## 2015-03-02 MED ORDER — ONDANSETRON HCL 4 MG/2ML IJ SOLN
INTRAMUSCULAR | Status: AC
  Filled 2015-03-02: qty 2

## 2015-03-02 MED ORDER — ROCURONIUM BROMIDE 100 MG/10ML IV SOLN
INTRAVENOUS | Status: DC | PRN
  Administered 2015-03-02: 40 mg via INTRAVENOUS

## 2015-03-02 MED ORDER — ROCURONIUM BROMIDE 50 MG/5ML IV SOLN
INTRAVENOUS | Status: AC
  Filled 2015-03-02: qty 1

## 2015-03-02 MED ORDER — 0.9 % SODIUM CHLORIDE (POUR BTL) OPTIME
TOPICAL | Status: DC | PRN
  Administered 2015-03-02: 1000 mL

## 2015-03-02 MED ORDER — PROPOFOL 10 MG/ML IV BOLUS
INTRAVENOUS | Status: AC
  Filled 2015-03-02: qty 20

## 2015-03-02 MED ORDER — MIDAZOLAM HCL 5 MG/5ML IJ SOLN
INTRAMUSCULAR | Status: DC | PRN
  Administered 2015-03-02: 2 mg via INTRAVENOUS

## 2015-03-02 MED ORDER — NEOSTIGMINE METHYLSULFATE 10 MG/10ML IV SOLN
INTRAVENOUS | Status: DC | PRN
  Administered 2015-03-02: 3 mg via INTRAVENOUS

## 2015-03-02 MED ORDER — SUCCINYLCHOLINE CHLORIDE 20 MG/ML IJ SOLN
INTRAMUSCULAR | Status: DC | PRN
  Administered 2015-03-02: 120 mg via INTRAVENOUS

## 2015-03-02 MED ORDER — GLYCOPYRROLATE 0.2 MG/ML IJ SOLN
INTRAMUSCULAR | Status: DC | PRN
  Administered 2015-03-02: 0.4 mg via INTRAVENOUS

## 2015-03-02 MED ORDER — PANTOPRAZOLE SODIUM 40 MG IV SOLR
40.0000 mg | INTRAVENOUS | Status: DC
Start: 2015-03-02 — End: 2015-03-04
  Administered 2015-03-03: 40 mg via INTRAVENOUS
  Filled 2015-03-02 (×2): qty 40

## 2015-03-02 MED ORDER — FENTANYL CITRATE (PF) 100 MCG/2ML IJ SOLN
INTRAMUSCULAR | Status: DC | PRN
  Administered 2015-03-02: 50 ug via INTRAVENOUS
  Administered 2015-03-02: 100 ug via INTRAVENOUS
  Administered 2015-03-02 (×2): 50 ug via INTRAVENOUS

## 2015-03-02 MED ORDER — LIDOCAINE HCL (CARDIAC) 20 MG/ML IV SOLN
INTRAVENOUS | Status: DC | PRN
  Administered 2015-03-02: 60 mg via INTRAVENOUS

## 2015-03-02 MED ORDER — MORPHINE SULFATE (PF) 2 MG/ML IV SOLN
2.0000 mg | INTRAVENOUS | Status: DC | PRN
  Administered 2015-03-02: 2 mg via INTRAVENOUS
  Administered 2015-03-03: 1 mg via INTRAVENOUS
  Filled 2015-03-02: qty 1

## 2015-03-02 MED ORDER — ONDANSETRON HCL 4 MG/2ML IJ SOLN: INTRAMUSCULAR | Status: DC | PRN

## 2015-03-02 MED ORDER — MORPHINE SULFATE (PF) 2 MG/ML IV SOLN
INTRAVENOUS | Status: AC
  Administered 2015-03-02: 2 mg via INTRAVENOUS
  Filled 2015-03-02: qty 1

## 2015-03-02 SURGICAL SUPPLY — 68 items
BANDAGE ESMARK 6X9 LF (GAUZE/BANDAGES/DRESSINGS) ×1 IMPLANT
BIT DRILL 2.5X2.75 QC CALB (BIT) ×1 IMPLANT
BIT DRILL 3.5X5.5 QC CALB (BIT) ×1 IMPLANT
BLADE SURG 15 STRL LF DISP TIS (BLADE) ×1 IMPLANT
BLADE SURG 15 STRL SS (BLADE) ×2
BNDG CMPR 9X6 STRL LF SNTH (GAUZE/BANDAGES/DRESSINGS) ×1
BNDG COHESIVE 4X5 TAN STRL (GAUZE/BANDAGES/DRESSINGS) ×2 IMPLANT
BNDG COHESIVE 6X5 TAN STRL LF (GAUZE/BANDAGES/DRESSINGS) ×2 IMPLANT
BNDG ESMARK 6X9 LF (GAUZE/BANDAGES/DRESSINGS) ×2
CANISTER SUCT 3000ML PPV (MISCELLANEOUS) ×2 IMPLANT
CHLORAPREP W/TINT 26ML (MISCELLANEOUS) ×2 IMPLANT
COVER SURGICAL LIGHT HANDLE (MISCELLANEOUS) ×2 IMPLANT
CUFF TOURNIQUET SINGLE 34IN LL (TOURNIQUET CUFF) ×2 IMPLANT
CUFF TOURNIQUET SINGLE 44IN (TOURNIQUET CUFF) IMPLANT
DRAPE OEC MINIVIEW 54X84 (DRAPES) ×2 IMPLANT
DRAPE U-SHAPE 47X51 STRL (DRAPES) ×2 IMPLANT
DRSG ADAPTIC 3X8 NADH LF (GAUZE/BANDAGES/DRESSINGS) IMPLANT
DRSG MEPITEL 4X7.2 (GAUZE/BANDAGES/DRESSINGS) ×1 IMPLANT
DRSG PAD ABDOMINAL 8X10 ST (GAUZE/BANDAGES/DRESSINGS) ×3 IMPLANT
ELECT REM PT RETURN 9FT ADLT (ELECTROSURGICAL) ×2
ELECTRODE REM PT RTRN 9FT ADLT (ELECTROSURGICAL) ×1 IMPLANT
GAUZE SPONGE 4X4 12PLY STRL (GAUZE/BANDAGES/DRESSINGS) ×1 IMPLANT
GLOVE BIO SURGEON STRL SZ8 (GLOVE) ×2 IMPLANT
GLOVE BIOGEL PI IND STRL 8 (GLOVE) ×1 IMPLANT
GLOVE BIOGEL PI INDICATOR 8 (GLOVE) ×1
GLOVE ECLIPSE 7.5 STRL STRAW (GLOVE) ×2 IMPLANT
GOWN STRL REUS W/ TWL LRG LVL3 (GOWN DISPOSABLE) ×1 IMPLANT
GOWN STRL REUS W/ TWL XL LVL3 (GOWN DISPOSABLE) ×2 IMPLANT
GOWN STRL REUS W/TWL LRG LVL3 (GOWN DISPOSABLE) ×2
GOWN STRL REUS W/TWL XL LVL3 (GOWN DISPOSABLE) ×4
K-WIRE ACE 1.6X6 (WIRE) ×4
KIT BASIN OR (CUSTOM PROCEDURE TRAY) ×2 IMPLANT
KIT ROOM TURNOVER OR (KITS) ×2 IMPLANT
KWIRE ACE 1.6X6 (WIRE) IMPLANT
NEEDLE 22X1 1/2 (OR ONLY) (NEEDLE) IMPLANT
NS IRRIG 1000ML POUR BTL (IV SOLUTION) ×2 IMPLANT
PACK ORTHO EXTREMITY (CUSTOM PROCEDURE TRAY) ×2 IMPLANT
PAD ARMBOARD 7.5X6 YLW CONV (MISCELLANEOUS) ×4 IMPLANT
PAD CAST 4YDX4 CTTN HI CHSV (CAST SUPPLIES) ×1 IMPLANT
PADDING CAST COTTON 4X4 STRL (CAST SUPPLIES) ×2
PADDING CAST COTTON 6X4 STRL (CAST SUPPLIES) ×1 IMPLANT
PLATE ACE 100DEG 7HOLE (Plate) ×1 IMPLANT
SCREW ACE CAN 4.0 40M (Screw) ×1 IMPLANT
SCREW ACE CAN 4.0 42M (Screw) ×1 IMPLANT
SCREW CORT 3.5X16 815037016 (Screw) ×1 IMPLANT
SCREW CORT 3.5X26 815037026 (Screw) ×1 IMPLANT
SCREW CORTICAL 3.5MM  20MM (Screw) ×1 IMPLANT
SCREW CORTICAL 3.5MM 14MM (Screw) ×3 IMPLANT
SCREW CORTICAL 3.5MM 18MM (Screw) ×1 IMPLANT
SCREW CORTICAL 3.5MM 20MM (Screw) IMPLANT
SPONGE LAP 18X18 X RAY DECT (DISPOSABLE) ×2 IMPLANT
STAPLER VISISTAT 35W (STAPLE) IMPLANT
SUCTION FRAZIER HANDLE 10FR (MISCELLANEOUS) ×1
SUCTION TUBE FRAZIER 10FR DISP (MISCELLANEOUS) ×1 IMPLANT
SUT ETHILON 3 0 PS 1 (SUTURE) ×2 IMPLANT
SUT MNCRL AB 3-0 PS2 18 (SUTURE) ×1 IMPLANT
SUT PROLENE 3 0 PS 2 (SUTURE) ×2 IMPLANT
SUT VIC AB 0 CT1 27 (SUTURE) ×4
SUT VIC AB 0 CT1 27XBRD ANBCTR (SUTURE) IMPLANT
SUT VIC AB 2-0 CT1 27 (SUTURE) ×4
SUT VIC AB 2-0 CT1 TAPERPNT 27 (SUTURE) ×2 IMPLANT
SUT VIC AB 3-0 PS2 18 (SUTURE)
SUT VIC AB 3-0 PS2 18XBRD (SUTURE) ×1 IMPLANT
SYR CONTROL 10ML LL (SYRINGE) IMPLANT
TOWEL OR 17X24 6PK STRL BLUE (TOWEL DISPOSABLE) ×2 IMPLANT
TOWEL OR 17X26 10 PK STRL BLUE (TOWEL DISPOSABLE) ×2 IMPLANT
TUBE CONNECTING 12X1/4 (SUCTIONS) ×2 IMPLANT
WATER STERILE IRR 1000ML POUR (IV SOLUTION) ×1 IMPLANT

## 2015-03-02 NOTE — Anesthesia Postprocedure Evaluation (Signed)
Anesthesia Post Note  Patient: Phillip Butler  Procedure(s) Performed: Procedure(s) (LRB): OPEN REDUCTION INTERNAL FIXATION (ORIF) LEFT ANKLE TRIMALLEOLAR FRACTURE (Left)  Patient location during evaluation: PACU Anesthesia Type: General Level of consciousness: sedated Pain management: pain level controlled Vital Signs Assessment: post-procedure vital signs reviewed and stable Respiratory status: spontaneous breathing and respiratory function stable Cardiovascular status: stable Anesthetic complications: no    Last Vitals:  Filed Vitals:   03/02/15 1834 03/02/15 1838  BP: 137/91   Pulse: 93 95  Temp:    Resp: 21 19    Last Pain:  Filed Vitals:   03/02/15 1840  PainSc: Asleep                 Latrecia Capito DANIEL

## 2015-03-02 NOTE — Progress Notes (Addendum)
ANTICOAGULATION CONSULT NOTE - Follow-up Consult  Pharmacy Consult for heparin Indication: DVT  Allergies  Allergen Reactions  . Fluoxetine Hcl     REACTION: ineffective    Patient Measurements: Height:  (175.3 cm) Weight: 265 lb 4.8 oz (120.339 kg) IBW/kg (Calculated) : 70.7 Heparin Dosing Weight: 98 kg  Vital Signs: Temp: 97.9 F (36.6 C) (02/09 1650) BP: 144/100 mmHg (02/09 2205) Pulse Rate: 104 (02/09 2205)  Labs:  Recent Labs  03/01/15 0637 03/01/15 1505 03/02/15 0722  HGB 11.0*  --  11.0*  HCT 34.2*  --  33.4*  PLT 225  --  263  HEPARINUNFRC 0.70 0.48 0.57    Estimated Creatinine Clearance: 133.6 mL/min (by C-G formula based on Cr of 0.8).  Assessment: 55 yo male presenting s/p fall - ankle fracture. LLE is + for DVT. S/p ORIF 2/9 with AET 1649. Plan was for Xarelto to start post-op 2/10. Now pt with post-op respiratory failure and heparin to be restarted for r/o PE. Pt going to ICU post-op.  Goal of Therapy:  Heparin level 0.3-0.7 units/ml Monitor platelets by anticoagulation protocol: Yes   Plan:  Restart heparin at 1850 units/h. No bolus since pt post-op. Heparin level in 6 hours Daily heparin level and CBC  Christoper Fabian, PharmD, BCPS Clinical pharmacist, pager [phone] 03/02/2015 10:55 PM

## 2015-03-02 NOTE — Transfer of Care (Signed)
Immediate Anesthesia Transfer of Care Note  Patient: Phillip Butler  Procedure(s) Performed: Procedure(s): OPEN REDUCTION INTERNAL FIXATION (ORIF) LEFT ANKLE TRIMALLEOLAR FRACTURE (Left)  Patient Location: PACU  Anesthesia Type:General  Level of Consciousness: awake  Airway & Oxygen Therapy: Patient Spontanous Breathing and Patient connected to face mask oxygen  Post-op Assessment: Report given to RN and Post -op Vital signs reviewed and stable  Post vital signs: Reviewed and stable  Last Vitals:  Filed Vitals:   03/01/15 2028 03/02/15 0500  BP: 149/82 144/76  Pulse: 116 105  Temp: 37.1 C 36.8 C  Resp: 19 18    Complications: No apparent anesthesia complications

## 2015-03-02 NOTE — Progress Notes (Signed)
PROGRESS NOTE  Phillip Butler:[id number] DOB: 1960-09-06 DOA: 02/26/2015 PCP: Oliver Barre, MD  HPI/Recap of past 40 hours: 55 year old male with past oral history of obstructive sleep apnea, autism and hypertension admitted on 2/5 after being unable to bear weight after a fall on his left foot and found to have a left ankle trimalleolar fracture.during workup, patient found to have left calf DVT. Hospitalist consulted.  Patient started on IV heparin prior to surgery which is planned for later today  Today, patient doing okay. Seen before surgery.  Limited response and affect secondary to autism. Not great historian. No complaints.  Assessment/Plan: Active Problems:   Fracture of ankle, trimalleolar, right, closed:for surgery tomorrow    Autism: medical decisions per family     DVT, lower extremity, distal, acute (HCC): currently on heparin with plans for xarelto postop  Code Status: full code   Family Communication: left message for patient's brother who is caretaker   Disposition Plan: potential skilled nursing versus inpatient rehabilitation, awaiting PT eval after surgery    Consultants:  hospitalists  Procedures:  Planned ankle repair 2/9   Antibiotics:  none   Objective: BP 144/76 mmHg  Pulse 105  Temp(Src) 98.2 F (36.8 C) (Oral)  Resp 18  Ht  (1.753 m)  Wt 120.339 kg (265 lb 4.8 oz)  BMI 39.16 kg/m2  SpO2 93%  Intake/Output Summary (Last 24 hours) at 03/02/15 1520 Last data filed at 03/02/15 0211  Gross per 24 hour  Intake    480 ml  Output    301 ml  Net    179 ml   Filed Weights   02/26/15 1750 02/27/15 0108  Weight: 107.502 kg (237 lb) 120.339 kg (265 lb 4.8 oz)    Exam: No change from previous  General:  Alert and oriented 2, no acute distress   Cardiovascular: regular rate and rhythm, S1-S2   Respiratory: clear to auscultation bilaterally   Abdomen: soft, obese, nontender, positive bowel sounds   Musculoskeletal: no  clubbing or cyanosis, trace edema    Data Reviewed: Basic Metabolic Panel:  Recent Labs Lab 02/26/15 2209 02/26/15 2218  NA  --  141  K  --  4.5  CL  --  105  GLUCOSE  --  154*  BUN  --  24*  CREATININE 0.90 0.80   Liver Function Tests: No results for input(s): AST, ALT, ALKPHOS, BILITOT, PROT, ALBUMIN in the last 168 hours. No results for input(s): LIPASE, AMYLASE in the last 168 hours. No results for input(s): AMMONIA in the last 168 hours. CBC:  Recent Labs Lab 02/26/15 2209 02/26/15 2218 03/01/15 0637 03/02/15 0722  WBC 9.9  --  6.6 6.3  NEUTROABS 8.3*  --   --   --   HGB 12.8* 14.3 11.0* 11.0*  HCT 38.6* 42.0 34.2* 33.4*  MCV 88.3  --  92.4 89.8  PLT 266  --  225 263   Cardiac Enzymes:   No results for input(s): CKTOTAL, CKMB, CKMBINDEX, TROPONINI in the last 168 hours. BNP (last 3 results) No results for input(s): BNP in the last 8760 hours.  ProBNP (last 3 results) No results for input(s): PROBNP in the last 8760 hours.  CBG: No results for input(s): GLUCAP in the last 168 hours.  Recent Results (from the past 240 hour(s))  Surgical pcr screen     Status: None   Collection Time: 02/27/15 10:40 AM  Result Value Ref Range Status   MRSA, PCR NEGATIVE NEGATIVE  Final   Staphylococcus aureus NEGATIVE NEGATIVE Final    Comment:        The Xpert SA Assay (FDA approved for NASAL specimens in patients over 24 years of age), is one component of a comprehensive surveillance program.  Test performance has been validated by [facility] for patients greater than or equal to 50 year old. It is not intended to diagnose infection nor to guide or monitor treatment.      Studies: No results found.  Scheduled Meds: .  ceFAZolin (ANCEF) IV  3 g Intravenous To SS-Surg  . chlorhexidine  60 mL Topical Once  . [MAR Hold] docusate sodium  100 mg Oral BID  . [MAR Hold] fesoterodine  4 mg Oral Daily  . [MAR Hold] lisinopril  20 mg Oral Daily  . [MAR Hold]  pantoprazole  40 mg Oral Daily  . [MAR Hold] senna  1 tablet Oral BID  . [MAR Hold] sertraline  100 mg Oral Daily  . [MAR Hold] traZODone  50 mg Oral QHS    Continuous Infusions: . lactated ringers 10 mL/hr at 03/02/15 1407     Time spent: 10 minutes   Hollice Espy  [facility] Pager [phone] . If 7PM-7AM, please contact night-coverage at [email or web address], password [facility] 03/02/2015, 3:20 PM  LOS: 4 days

## 2015-03-02 NOTE — Anesthesia Preprocedure Evaluation (Addendum)
Anesthesia Evaluation  Patient identified by MRN, date of birth, ID band Patient awake    Reviewed: Allergy & Precautions, H&P , NPO status , Patient's Chart, lab work & pertinent test results  Airway Mallampati: II  TM Distance: >3 FB Neck ROM: Full    Dental no notable dental hx. (+) Teeth Intact, Poor Dentition, Dental Advisory Given   Pulmonary sleep apnea ,    Pulmonary exam normal breath sounds clear to auscultation       Cardiovascular hypertension, Pt. on medications + Peripheral Vascular Disease   Rhythm:Regular Rate:Normal     Neuro/Psych Anxiety Depression negative neurological ROS     GI/Hepatic Neg liver ROS, GERD  Medicated and Controlled,  Endo/Other  negative endocrine ROS  Renal/GU negative Renal ROS  negative genitourinary   Musculoskeletal   Abdominal (+) + obese,   Peds  Hematology negative hematology ROS (+)   Anesthesia Other Findings Communication moderately difficult due to autism  Reproductive/Obstetrics negative OB ROS                            Anesthesia Physical Anesthesia Plan  ASA: III  Anesthesia Plan: General and Regional   Post-op Pain Management: GA combined w/ Regional for post-op pain   Induction: Intravenous  Airway Management Planned: LMA  Additional Equipment:   Intra-op Plan:   Post-operative Plan: Extubation in OR  Informed Consent: I have reviewed the patients History and Physical, chart, labs and discussed the procedure including the risks, benefits and alternatives for the proposed anesthesia with the patient or authorized representative who has indicated his/her understanding and acceptance.   Dental advisory given  Plan Discussed with: CRNA  Anesthesia Plan Comments:         Anesthesia Quick Evaluation

## 2015-03-02 NOTE — Consult Note (Signed)
PULMONARY / CRITICAL CARE MEDICINE   Name: Phillip Butler MRN: [id number] DOB: 1960-12-18    ADMISSION DATE:  02/26/2015 CONSULTATION DATE:  03/02/15  REFERRING MD:  Victorino Dike, MD  CHIEF COMPLAINT:  Hypercapnia Hypoxic resp failure  HISTORY OF PRESENT ILLNESS:   55 yr old Autism, h/o OSA denies home cpap, fell at home 2/5 with left ankle fracture, had increasing swelling. Doppler pos bewlo knee DVT and started on Lovenox. Denied CP and sob then, Called now post op ORIF in pacu with post extubation hypoxic / hypercapnic resp failure. Pt given Bipap in pacu. No sib, cp. Limited blood loss. 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  PAST SURGICAL HISTORY: He  has past surgical history that includes No past surgeries.  Allergies  Allergen Reactions  . Fluoxetine Hcl     REACTION: ineffective    No current facility-administered medications on file prior to encounter.   Current Outpatient Prescriptions on File Prior to Encounter  Medication Sig  . aspirin 81 MG EC tablet Take 1 tablet (81 mg total) by mouth daily.  . fesoterodine (TOVIAZ) 4 MG TB24 tablet Take 1 tablet (4 mg total) by mouth daily.  Marland Kitchen lisinopril (PRINIVIL,ZESTRIL) 20 MG tablet Take 1 tablet (20 mg total) by mouth daily.  Marland Kitchen omeprazole (PRILOSEC) 20 MG capsule Take 1 capsule (20 mg total) by mouth daily.  . sertraline (ZOLOFT) 100 MG tablet Take 1 tablet (100 mg total) by mouth daily.  . traZODone (DESYREL) 50 MG tablet TAKE 1 TABLET BY MOUTH AT BEDTIME (Patient taking differently: Take 50 mg by mouth at bedtime. )  . guaiFENesin-dextromethorphan (ROBITUSSIN DM) 100-10 MG/5ML syrup Take 5 mLs by mouth every 4 (four) hours as needed for cough. (Patient not taking: Reported on  02/26/2015)    FAMILY HISTORY:  His indicated that his mother is deceased. He indicated that his father is deceased.   SOCIAL HISTORY: He  reports that he has never smoked. He has never used smokeless tobacco. He reports that he does not drink alcohol or use illicit drugs.  REVIEW OF SYSTEMS:   Unable , as on BIPAP  SUBJECTIVE:  Denies CP or sig sob  VITAL SIGNS: BP 144/100 mmHg  Pulse 104  Temp(Src) 97.9 F (36.6 C) (Oral)  Resp 20  Ht  (1.753 m)  Wt 120.339 kg (265 lb 4.8 oz)  BMI 39.16 kg/m2  SpO2 95%  HEMODYNAMICS:    VENTILATOR SETTINGS: Vent Mode:  [-] BIPAP;PCV FiO2 (%):  [0.4 %-50 %] 50 % Set Rate:  [15 bmp-18 bmp] 18 bmp  INTAKE / OUTPUT: I/O last 3 completed shifts: In: 2205 [P.O.:955; I.V.:1000; IV Piggyback:250] Out: 1076 [Urine:1050; Stool:1; Blood:25]  PHYSICAL EXAMINATION: General:  Awake on BIPAP Neuro:  Nonfocal, coopertive HEENT:  jvd wnl Cardiovascular:  s1 s2 RRR distant, no rt heeve Lungs:  scattered ronchi, reduced Abdomen:  Soft, obese, NT, low BS Musculoskeletal:  Left wrap leg , ice packl Skin:  No rash  LABS:  BMET  Recent Labs Lab 02/26/15 2209 02/26/15 2218  NA  --  141  K  --  4.5  CL  --  105  BUN  --  24*  CREATININE 0.90 0.80  GLUCOSE  --  154*    Electrolytes No results for input(s): CALCIUM, MG, PHOS  in the last 168 hours.  CBC  Recent Labs Lab 02/26/15 2209 02/26/15 2218 03/01/15 0637 03/02/15 0722  WBC 9.9  --  6.6 6.3  HGB 12.8* 14.3 11.0* 11.0*  HCT 38.6* 42.0 34.2* 33.4*  PLT 266  --  225 263    Coag's No results for input(s): APTT, INR in the last 168 hours.  Sepsis Markers No results for input(s): LATICACIDVEN, PROCALCITON, O2SATVEN in the last 168 hours.  ABG  Recent Labs Lab 03/02/15 2039  PHART 7.304*  PCO2ART 55.0*  PO2ART 101*    Liver Enzymes No results for input(s): AST, ALT, ALKPHOS, BILITOT, ALBUMIN in the last 168 hours.  Cardiac Enzymes No results for input(s):  TROPONINI, PROBNP in the last 168 hours.  Glucose No results for input(s): GLUCAP in the last 168 hours.  Imaging No results found.   STUDIES:  DV tpos   CULTURES:   ANTIBIOTICS:   SIGNIFICANT EVENTS: 2/5 - fall 2/6 below knee dvt  2/9 ORIF with post op hypoxi / hypercapnic resp failure  LINES/TUBES:   ASSESSMENT / PLAN:  PULMONARY A: Hypercpnic, hypoxic resp failure, r/o residual Anesthesia in setting osa, r/o peri op PE, r/o ATX , r/o neg pressure edema P:   STAT pcxr NIMV okay for now, will use in icu and increase PC to 10, and repeat ABG Cycle 4 hrs on 30 min off, re assess need in am  Add heparin IV ( d/w ortho) now See cvs NPO Requires ICu as full code and using NIMV for resp failure  CARDIOVASCULAR A:  HTN, r/o PE P:  Echo for RV labetalol okay for now intermittent Trop ecg  RENAL A:  Prior BUN elevation  P:   Saline Chem now  GASTROINTESTINAL A:   NPO P:   Add ppi npo  HEMATOLOGIC A:   DVT, r/o PE P:  Add hep drip now Echo for RV  INFECTIOUS A:   No evidence infection P:   Monitor temp  ENDOCRINE A:   Mild hyperglycemia P:   Add ssi  NEUROLOGIC A:   Autism P:   RASS goal: 0 Avoid benzo  FAMILY  - Updates: to pt in bed in pacu  - Inter-disciplinary family meet or Palliative Care meeting due by: 2/16   Ccm time 35 min   Pulmonary and Critical Care Medicine [facility] Pager: [phone]  03/02/2015, 11:10 PM   Mcarthur Rossetti. Tyson Alias, MD, FACP Pgr: [phone] [facility]

## 2015-03-02 NOTE — Interval H&P Note (Signed)
History and Physical Interval Note:  03/02/2015 2:44 PM  Phillip Butler  has presented today for surgery, with the diagnosis of left ankle trimalleolar fracture dislocation  The various methods of treatment have been discussed with the patient and family. After consideration of risks, benefits and other options for treatment, the patient has consented to  Procedure(s): OPEN REDUCTION INTERNAL FIXATION (ORIF) LEFT ANKLE TRIMALLEOLAR FRACTURE (Left) as a surgical intervention .  The patient's history has been reviewed, patient examined, no change in status, stable for surgery.  I have reviewed the patient's chart and labs.  Questions were answered to the patient's satisfaction.    The risks and benefits of the alternative treatment options have been discussed in detail.  The patient wishes to proceed with surgery and specifically understands risks of bleeding, infection, nerve damage, blood clots, need for additional surgery, amputation and death.  Toni Arthurs

## 2015-03-02 NOTE — Brief Op Note (Signed)
02/26/2015 - 03/02/2015  4:26 PM  PATIENT:  Phillip Butler  55 y.o. male  PRE-OPERATIVE DIAGNOSIS:  left ankle trimalleolar fracture dislocation  POST-OPERATIVE DIAGNOSIS:  left ankle trimalleolar fracture dislocation  Procedure(s): 1.  OPEN REDUCTION INTERNAL FIXATION (ORIF) LEFT ANKLE TRIMALLEOLAR FRACTURE 2.  Stress exam of left ankle under fluoro 3.  AP, mortise and lateral xrays of the left ankle   SURGEON:  Toni Arthurs, MD  ASSISTANT: n/a  ANESTHESIA:   General, regional  EBL:  minimal   TOURNIQUET:   Total Tourniquet Time Documented: Thigh (Left) - 54 minutes Total: Thigh (Left) - 54 minutes  COMPLICATIONS:  None apparent  DISPOSITION:  Extubated, awake and stable to recovery.  DICTATION ID:  [id number]

## 2015-03-02 NOTE — Anesthesia Procedure Notes (Addendum)
Procedure Name: Intubation Date/Time: 03/02/2015 2:51 PM Performed by: Margaree Mackintosh Pre-anesthesia Checklist: Patient identified, Emergency Drugs available, Suction available, Patient being monitored and Timeout performed Patient Re-evaluated:Patient Re-evaluated prior to inductionOxygen Delivery Method: Circle system utilized Preoxygenation: Pre-oxygenation with 100% oxygen Intubation Type: IV induction and Rapid sequence Laryngoscope Size: Mac and 4 Grade View: Grade II Tube type: Oral Tube size: 7.5 mm Number of attempts: 1 Airway Equipment and Method: Stylet Placement Confirmation: ETT inserted through vocal cords under direct vision,  positive ETCO2 and breath sounds checked- equal and bilateral Secured at: 22 cm Tube secured with: Tape Dental Injury: Teeth and Oropharynx as per pre-operative assessment    Anesthesia Regional Block:  Popliteal block  Pre-Anesthetic Checklist: ,, timeout performed, Correct Patient, Correct Site, Correct Laterality, Correct Procedure, Correct Position, site marked, Risks and benefits discussed,  Surgical consent,  Pre-op evaluation,  At surgeon's request and post-op pain management  Laterality: Left  Prep: chloraprep       Needles:  Injection technique: Single-shot  Needle Type: Echogenic Stimulator Needle     Needle Length: 9cm 9 cm Needle Gauge: 21 and 21 G    Additional Needles: Popliteal block Narrative:  Start time: 03/02/2015 3:11 PM End time: 03/02/2015 3:21 PM Injection made incrementally with aspirations every 5 mL.  Performed by: Personally   Additional Notes: 30 cc 0.5% Bupivacaine injected easily

## 2015-03-02 NOTE — Clinical Social Work Note (Signed)
CSW attempted to call patient's sister Bonita Quin to present bed offers, the phone number is not active.  CSW to continue to try to contact patient's sister to discuss SNF offers.  Ervin Knack. Jennise Both, MSW, Theresia Majors [phone] 03/02/2015 5:17 PM

## 2015-03-03 ENCOUNTER — Encounter (HOSPITAL_COMMUNITY): Payer: Self-pay | Admitting: Orthopedic Surgery

## 2015-03-03 ENCOUNTER — Other Ambulatory Visit (HOSPITAL_COMMUNITY): Payer: Medicare Other

## 2015-03-03 ENCOUNTER — Inpatient Hospital Stay (HOSPITAL_COMMUNITY): Payer: Medicare Other

## 2015-03-03 DIAGNOSIS — J989 Respiratory disorder, unspecified: Secondary | ICD-10-CM

## 2015-03-03 DIAGNOSIS — E669 Obesity, unspecified: Secondary | ICD-10-CM

## 2015-03-03 DIAGNOSIS — R069 Unspecified abnormalities of breathing: Secondary | ICD-10-CM | POA: Diagnosis present

## 2015-03-03 DIAGNOSIS — S82852D Displaced trimalleolar fracture of left lower leg, subsequent encounter for closed fracture with routine healing: Secondary | ICD-10-CM

## 2015-03-03 DIAGNOSIS — M79669 Pain in unspecified lower leg: Secondary | ICD-10-CM | POA: Diagnosis present

## 2015-03-03 LAB — COMPREHENSIVE METABOLIC PANEL
ALT: 23 U/L (ref 17–63)
ANION GAP: 11 (ref 5–15)
AST: 28 U/L (ref 15–41)
Albumin: 3.1 g/dL — ABNORMAL LOW (ref 3.5–5.0)
Alkaline Phosphatase: 94 U/L (ref 38–126)
BILIRUBIN TOTAL: 0.8 mg/dL (ref 0.3–1.2)
BUN: 16 mg/dL (ref 6–20)
CO2: 27 mmol/L (ref 22–32)
Calcium: 8.6 mg/dL — ABNORMAL LOW (ref 8.9–10.3)
Chloride: 103 mmol/L (ref 101–111)
Creatinine, Ser: 0.83 mg/dL (ref 0.61–1.24)
GFR calc Af Amer: >60 mL/min (ref >60)
Glucose, Bld: 113 mg/dL — ABNORMAL HIGH (ref 65–99)
POTASSIUM: 5 mmol/L (ref 3.5–5.1)
Sodium: 141 mmol/L (ref 135–145)
TOTAL PROTEIN: 7 g/dL (ref 6.5–8.1)

## 2015-03-03 LAB — CBC WITH DIFFERENTIAL/PLATELET
BASOS ABS: 0 10*3/uL (ref 0.0–0.1)
BASOS PCT: 0 %
EOS PCT: 1 %
Eosinophils Absolute: 0.1 10*3/uL (ref 0.0–0.7)
HCT: 33.7 % — ABNORMAL LOW (ref 39.0–52.0)
Hemoglobin: 10.5 g/dL — ABNORMAL LOW (ref 13.0–17.0)
LYMPHS PCT: 11 %
Lymphs Abs: 0.9 10*3/uL (ref 0.7–4.0)
MCH: 28.5 pg (ref 26.0–34.0)
MCHC: 31.2 g/dL (ref 30.0–36.0)
MCV: 91.6 fL (ref 78.0–100.0)
Monocytes Absolute: 0.8 10*3/uL (ref 0.1–1.0)
Monocytes Relative: 10 %
NEUTROS ABS: 6.3 10*3/uL (ref 1.7–7.7)
Neutrophils Relative %: 77 %
PLATELETS: 260 10*3/uL (ref 150–400)
RBC: 3.68 MIL/uL — AB (ref 4.22–5.81)
RDW: 13.4 % (ref 11.5–15.5)
WBC: 8.2 10*3/uL (ref 4.0–10.5)

## 2015-03-03 LAB — GLUCOSE, CAPILLARY
GLUCOSE-CAPILLARY: 110 mg/dL — AB (ref 65–99)
GLUCOSE-CAPILLARY: 112 mg/dL — AB (ref 65–99)
Glucose-Capillary: 94 mg/dL (ref 65–99)
Glucose-Capillary: 99 mg/dL (ref 65–99)
Glucose-Capillary: 111 mg/dL — ABNORMAL HIGH (ref 65–99)

## 2015-03-03 LAB — HEPARIN LEVEL (UNFRACTIONATED)
HEPARIN UNFRACTIONATED: 0.48 [IU]/mL (ref 0.30–0.70)
Heparin Unfractionated: 0.28 IU/mL — ABNORMAL LOW (ref 0.30–0.70)
Heparin Unfractionated: 0.24 IU/mL — ABNORMAL LOW (ref 0.30–0.70)

## 2015-03-03 LAB — TROPONIN I: TROPONIN I: 1.02 ng/mL — AB (ref <0.031)

## 2015-03-03 MED ORDER — CELECOXIB 200 MG PO CAPS
200.0000 mg | ORAL_CAPSULE | Freq: Two times a day (BID) | ORAL | Status: AC
  Administered 2015-03-03 – 2015-03-05 (×5): 200 mg via ORAL
  Filled 2015-03-03 (×7): qty 1

## 2015-03-03 MED ORDER — ACETAMINOPHEN 650 MG RE SUPP: 650.0000 mg | Freq: Four times a day (QID) | RECTAL | Status: DC | PRN

## 2015-03-03 MED ORDER — ACETAMINOPHEN 325 MG PO TABS
650.0000 mg | ORAL_TABLET | Freq: Four times a day (QID) | ORAL | Status: DC | PRN
Start: 2015-03-03 — End: 2015-03-06

## 2015-03-03 MED ORDER — HEPARIN BOLUS VIA INFUSION
1450.0000 [IU] | Freq: Once | INTRAVENOUS | Status: AC
  Administered 2015-03-03: 1450 [IU] via INTRAVENOUS
  Filled 2015-03-03: qty 1450

## 2015-03-03 MED ORDER — ASPIRIN 81 MG PO CHEW: 81.0000 mg | CHEWABLE_TABLET | Freq: Once | ORAL | Status: DC

## 2015-03-03 MED ORDER — METOCLOPRAMIDE HCL 10 MG PO TABS: 5.0000 mg | ORAL_TABLET | Freq: Three times a day (TID) | ORAL | Status: DC | PRN

## 2015-03-03 MED ORDER — ASPIRIN 81 MG PO CHEW
81.0000 mg | CHEWABLE_TABLET | Freq: Once | ORAL | Status: AC
  Administered 2015-03-03: 81 mg via ORAL
  Filled 2015-03-03: qty 1

## 2015-03-03 MED ORDER — OXYCODONE HCL 5 MG PO TABS
5.0000 mg | ORAL_TABLET | ORAL | Status: DC | PRN
  Administered 2015-03-03 – 2015-03-05 (×5): 10 mg via ORAL
  Administered 2015-03-06 (×2): 5 mg via ORAL
  Filled 2015-03-03 (×2): qty 2
  Filled 2015-03-03: qty 1
  Filled 2015-03-03 (×4): qty 2

## 2015-03-03 MED ORDER — CETYLPYRIDINIUM CHLORIDE 0.05 % MT LIQD
7.0000 mL | Freq: Two times a day (BID) | OROMUCOSAL | Status: DC
  Administered 2015-03-03 – 2015-03-06 (×6): 7 mL via OROMUCOSAL

## 2015-03-03 MED ORDER — METOCLOPRAMIDE HCL 5 MG/ML IJ SOLN
5.0000 mg | Freq: Three times a day (TID) | INTRAMUSCULAR | Status: DC | PRN
  Filled 2015-03-03: qty 2

## 2015-03-03 NOTE — Progress Notes (Signed)
PT Cancellation Note  Patient Details Name: AMONI MORALES MRN: [id number] DOB: 01-04-1961   Cancelled Treatment:    Reason Eval/Treat Not Completed: Patient not medically ready Pt with + DVT LLE and s/p ORIF left ankle, however subtherapeutic on heparin. Will await levels to increase prior to PT tx.   Blake Divine A Jerra Huckeby 03/03/2015, 8:00 AM  Mylo Red, PT, DPT [phone]

## 2015-03-03 NOTE — Progress Notes (Signed)
UR Completed. Idan Prime, RN, BSN.  [phone] 

## 2015-03-03 NOTE — Progress Notes (Signed)
Subjective: 1 Day Post-Op Procedure(s) (LRB): OPEN REDUCTION INTERNAL FIXATION (ORIF) LEFT ANKLE TRIMALLEOLAR FRACTURE (Left) Patient reports pain as mild.  He denies CP, SOB.  On heparin per pharmacy notes. Pt transferred to MICU overnight under care of CCM.  Unclear what events led to transfer.  Objective: Vital signs in last 24 hours: Temp:  [98.4 F (36.9 C)-99.2 F (37.3 C)] 98.6 F (37 C) (02/10 1456) Pulse Rate:  [62-124] 124 (02/10 1456) Resp:  [11-28] 24 (02/10 1456) BP: (108-173)/(70-124) 143/76 mmHg (02/10 1456) SpO2:  [90 %-100 %] 91 % (02/10 1456) FiO2 (%):  [0.4 %-50 %] 50 % (02/10 0600)  Intake/Output from previous day: 02/09 0701 - 02/10 0700 In: 1936 [I.V.:1686; IV Piggyback:250] Out: 1050 [Urine:1025; Blood:25] Intake/Output this shift: Total I/O In: 497.3 [P.O.:240; I.V.:257.3] Out: 1325 [Urine:1325]   Recent Labs  03/01/15 0637 03/02/15 0722 03/03/15 0455  HGB 11.0* 11.0* 10.5*    Recent Labs  03/02/15 0722 03/03/15 0455  WBC 6.3 8.2  RBC 3.72* 3.68*  HCT 33.4* 33.7*  PLT 263 260    Recent Labs  03/03/15 0455  NA 141  K 5.0  CL 103  CO2 27  BUN 16  CREATININE 0.83  GLUCOSE 113*  CALCIUM 8.6*    Recent Labs  03/02/15 2328  INR 1.05    PE:  wn wd male sitting up eating his dinner.  A and O.  L LE splint intact.  Brisk cap refill at toes.  Assessment/Plan: 1 Day Post-Op Procedure(s) (LRB): OPEN REDUCTION INTERNAL FIXATION (ORIF) LEFT ANKLE TRIMALLEOLAR FRACTURE (Left) Discharge to SNF likely Monday.  Re consult SW.  Notified Dr. Rito Ehrlich that pt was back on floor.  Transition anticaog per Dr. Rito Ehrlich.  Toni Arthurs 03/03/2015, 4:57 PM

## 2015-03-03 NOTE — Progress Notes (Signed)
ANTICOAGULATION CONSULT NOTE - Follow-up Consult  Pharmacy Consult for heparin Indication: DVT  Allergies  Allergen Reactions  . Fluoxetine Hcl     REACTION: ineffective    Patient Measurements: Height:  (175.3 cm) Weight: 265 lb 4.8 oz (120.339 kg) IBW/kg (Calculated) : 70.7 Heparin Dosing Weight: 98 kg  Vital Signs: Temp: 98.5 F (36.9 C) (02/10 1131) Temp Source: Oral (02/10 1131) BP: 142/89 mmHg (02/10 1316) Pulse Rate: 105 (02/10 1000)  Labs:  Recent Labs  03/01/15 0637  03/02/15 0722 03/02/15 2328 03/03/15 0455 03/03/15 0550 03/03/15 1235  HGB 11.0*  --  11.0*  --  10.5*  --   --   HCT 34.2*  --  33.4*  --  33.7*  --   --   PLT 225  --  263  --  260  --   --   APTT  --   --   --  25  --   --   --   LABPROT  --   --   --  13.9  --   --   --   INR  --   --   --  1.05  --   --   --   HEPARINUNFRC 0.70  < > 0.57  --   --  0.28* 0.24*  CREATININE  --   --   --   --  0.83  --   --   TROPONINI  --   --   --  1.02*  --   --   --   < > = values in this interval not displayed.  Estimated Creatinine Clearance: 128.7 mL/min (by C-G formula based on Cr of 0.83).  Assessment: 55 yo m presenting s/p fall - ankle fracture. LLE is + for DVT. Pharmacy is consulted to dose heparin.  HL today is subtherapeutic at 0.24 on 1850 units/hr. No issues or bleeding per RN. Hgb 10.5, plts 260 - stable.  Goal of Therapy:  Heparin level 0.3-0.7 units/ml Monitor platelets by anticoagulation protocol: Yes   Plan:  Heparin bolus 1450 units x 1 Increase heparin infusion to 2050 units/hr 6-hr HL @ 2000 Daily heparin level and CBC F/u transition to oral AC  Aavya Shafer L. Roseanne Reno, PharmD PGY2 Infectious Diseases Pharmacy Resident Pager: [phone] 03/03/2015 1:43 PM

## 2015-03-03 NOTE — Consult Note (Signed)
PULMONARY / CRITICAL CARE MEDICINE   Name: Phillip Butler MRN: [id number] DOB: 05-28-1960    ADMISSION DATE:  02/26/2015 CONSULTATION DATE:  03/02/15  REFERRING MD:  Victorino Dike, MD  CHIEF COMPLAINT:  Hypercapnia Hypoxic resp failure  HISTORY OF PRESENT ILLNESS:   55 yr old Autism, h/o OSA denies home cpap, fell at home 2/5 with left ankle fracture, had increasing swelling. Doppler pos bewlo knee DVT and started on Lovenox. Denied CP and sob then, Called now post op ORIF in pacu with post extubation hypoxic / hypercapnic resp failure. Pt given Bipap in pacu. No sib, cp. Limited blood loss. Called to assist.    SUBJECTIVE:  Off bipap this morning and just on 6L [place]. Asking about when he can go home.   VITAL SIGNS: BP 148/109 mmHg  Pulse 94  Temp(Src) 98.4 F (36.9 C) (Oral)  Resp 25  Ht  (1.753 m)  Wt 265 lb 4.8 oz (120.339 kg)  BMI 39.16 kg/m2  SpO2 98%  HEMODYNAMICS:    VENTILATOR SETTINGS: N/A  INTAKE / OUTPUT: I/O last 3 completed shifts: In: 1867.5 [I.V.:1617.5; IV Piggyback:250] Out: 1350 [Urine:1325; Blood:25]  PHYSICAL EXAMINATION: General:  Awake, NAD Neuro:  Nonfocal, cooperative, A&O x3 HEENT:  Normal, [place] in place Cardiovascular:  s1 s2 RRR distant, no rt heeve Lungs: CTAB Abdomen:  Soft, obese, NT, low BS Musculoskeletal:  Left wrap leg , ice pack Skin:  No rash  LABS:  BMET  Recent Labs Lab 02/26/15 2209 02/26/15 2218 03/03/15 0455  NA  --  141 141  K  --  4.5 5.0  CL  --  105 103  CO2  --   --  27  BUN  --  24* 16  CREATININE 0.90 0.80 0.83  GLUCOSE  --  154* 113*    Electrolytes  Recent Labs Lab 03/03/15 0455  CALCIUM 8.6*    CBC  Recent Labs Lab 03/01/15 0637 03/02/15 0722 03/03/15 0455  WBC 6.6 6.3 8.2  HGB 11.0* 11.0* 10.5*  HCT 34.2* 33.4* 33.7*  PLT 225 263 260    Coag's  Recent Labs Lab 03/02/15 2328  APTT 25  INR 1.05    Sepsis Markers  Recent Labs Lab 03/02/15 2327  LATICACIDVEN 0.8     ABG  Recent Labs Lab 03/02/15 2039  PHART 7.304*  PCO2ART 55.0*  PO2ART 101*    Liver Enzymes  Recent Labs Lab 03/03/15 0455  AST 28  ALT 23  ALKPHOS 94  BILITOT 0.8  ALBUMIN 3.1*    Cardiac Enzymes  Recent Labs Lab 03/02/15 2328  TROPONINI 1.02*    Glucose  Recent Labs Lab 03/03/15 0335  GLUCAP 94    Imaging Dg Chest Port 1 View  03/02/2015  CLINICAL DATA:  Acute onset of respiratory difficulty. Initial encounter. EXAM: PORTABLE CHEST 1 VIEW COMPARISON:  Chest radiograph performed 03/03/2014 FINDINGS: The lungs are well-aerated. Mild bibasilar opacities may reflect mild pneumonia. No definite pleural effusion or pneumothorax is seen. The cardiomediastinal silhouette is mildly enlarged. No acute osseous abnormalities are seen. IMPRESSION: Mild bibasilar opacities may reflect mild pneumonia, given the patient's symptoms. Mild cardiomegaly noted. Electronically Signed   By: Roanna Raider M.D.   On: 03/02/2015 23:11    STUDIES:  DVT pos   CULTURES: N/A  ANTIBIOTICS: Cefazolin 2/9>> 2/10  SIGNIFICANT EVENTS: 2/5 - fall 2/6 below knee dvt  2/9 ORIF with post op hypoxi / hypercapnic resp failure  LINES/TUBES:   ASSESSMENT / PLAN:  PULMONARY  A: Hypercapnic, hypoxic resp failure, r/o residual anesthesia in setting osa, r/o peri op PE, r/o ATX , r/o neg pressure edema - Improved P:   Off bipap now on [place]  CARDIOVASCULAR A:  HTN, r/o PE P:  Echo for RV labetalol okay for now intermittent Trop elevated 1.02 ECG NSR; no ACS  RENAL A:   At risk for AKI P:   Renal function at baseline KVO fluids Replace electrolytes as needed  GASTROINTESTINAL A:   GERD P:   PPI Regular diet  HEMATOLOGIC A:   DVT, r/o PE P:  Continue Hep drip-- per surgery transitioning to Xarelto 24hrs after surgery Echo for RV  INFECTIOUS A:   No evidence infection P:   Monitor temp Post-op ancef Lactic acid wnl  ENDOCRINE A:   Mild  hyperglycemia P:   SSI  NEUROLOGIC A:   Autism P:   RASS goal: 0 Avoid benzo   Transfer back to Hospitalist service   FAMILY  - Updates: Patient updated.  - Inter-disciplinary family meet or Palliative Care meeting due by: 2/16  Caryl Ada, DO 03/03/2015, 8:58 AM PGY-2, [facility]

## 2015-03-03 NOTE — Progress Notes (Signed)
PROGRESS NOTE  Phillip Butler MWU:[id number] DOB: Jan 16, 1961 DOA: 02/26/2015 PCP: Phillip Barre, MD  HPI/Recap of past 34 hours: 55 year old male with past oral history of obstructive sleep apnea, autism and hypertension admitted on 2/5 after being unable to bear weight after a fall on his left foot and found to have a left ankle trimalleolar fracture.during workup, patient found to have left calf DVT. Hospitalist consulted.  Patient started on IV heparin prior to surgery.  Patient taken to operating room for repair on evening/9 without incident. However, in recovery, oxygen saturations low patient transferred to ICU. Felt to have residual anesthesia interacting with sleep apnea causing some respiratory failure. Patient stabilized and now on room air, transferred to medical floor on 2/10 in evening  Patient currently with no complaints.  Assessment/Plan: Active Problems:   Fracture of ankle, trimalleolar, right, closed: Status post repair    Autism: medical decisions per family   Acute respiratory failure: Likely residual anesthesia in conjunction with sleep apnea. Resolved    DVT, lower extremity, distal, acute (HCC): currently on heparin with plans for xarelto postop  Code Status: full code   Family Communication: left message for patient's brother who is caretaker   Disposition Plan: potential skilled nursing versus inpatient rehabilitation, awaiting PT eval after surgery    Consultants:  hospitalists  Critical care  Procedures:  ORIF of left ankle 2/9   Antibiotics:  none   Objective: BP 143/76 mmHg  Pulse 124  Temp(Src) 98.6 F (37 C) (Oral)  Resp 24  Ht  (1.753 m)  Wt 120.339 kg (265 lb 4.8 oz)  BMI 39.16 kg/m2  SpO2 91%  Intake/Output Summary (Last 24 hours) at 03/03/15 1835 Last data filed at 03/03/15 1700  Gross per 24 hour  Intake 1423.33 ml  Output   2350 ml  Net -926.67 ml   Filed Weights   02/26/15 1750 02/27/15 0108  Weight: 107.502 kg  (237 lb) 120.339 kg (265 lb 4.8 oz)    Exam:   General:  Alert and oriented 2, no acute distress   Cardiovascular: regular rate and rhythm, S1-S2   Respiratory: clear to auscultation bilaterally   Abdomen: soft, obese, nontender, positive bowel sounds   Musculoskeletal: Left foot wrapped  Data Reviewed: Basic Metabolic Panel:  Recent Labs Lab 02/26/15 2209 02/26/15 2218 03/03/15 0455  NA  --  141 141  K  --  4.5 5.0  CL  --  105 103  CO2  --   --  27  GLUCOSE  --  154* 113*  BUN  --  24* 16  CREATININE 0.90 0.80 0.83  CALCIUM  --   --  8.6*   Liver Function Tests:  Recent Labs Lab 03/03/15 0455  AST 28  ALT 23  ALKPHOS 94  BILITOT 0.8  PROT 7.0  ALBUMIN 3.1*   No results for input(s): LIPASE, AMYLASE in the last 168 hours. No results for input(s): AMMONIA in the last 168 hours. CBC:  Recent Labs Lab 02/26/15 2209 02/26/15 2218 03/01/15 0637 03/02/15 0722 03/03/15 0455  WBC 9.9  --  6.6 6.3 8.2  NEUTROABS 8.3*  --   --   --  6.3  HGB 12.8* 14.3 11.0* 11.0* 10.5*  HCT 38.6* 42.0 34.2* 33.4* 33.7*  MCV 88.3  --  92.4 89.8 91.6  PLT 266  --  225 263 260   Cardiac Enzymes:    Recent Labs Lab 03/02/15 2328  TROPONINI 1.02*   BNP (last 3  results) No results for input(s): BNP in the last 8760 hours.  ProBNP (last 3 results) No results for input(s): PROBNP in the last 8760 hours.  CBG:  Recent Labs Lab 03/03/15 0335 03/03/15 0845 03/03/15 1129 03/03/15 1614  GLUCAP 94 99 111* 110*    Recent Results (from the past 240 hour(s))  Surgical pcr screen     Status: None   Collection Time: 02/27/15 10:40 AM  Result Value Ref Range Status   MRSA, PCR NEGATIVE NEGATIVE Final   Staphylococcus aureus NEGATIVE NEGATIVE Final    Comment:        The Xpert SA Assay (FDA approved for NASAL specimens in patients over 27 years of age), is one component of a comprehensive surveillance program.  Test performance has been validated by [facility]  for patients greater than or equal to 25 year old. It is not intended to diagnose infection nor to guide or monitor treatment.      Studies: Dg Chest Port 1 View  03/02/2015  CLINICAL DATA:  Acute onset of respiratory difficulty. Initial encounter. EXAM: PORTABLE CHEST 1 VIEW COMPARISON:  Chest radiograph performed 03/03/2014 FINDINGS: The lungs are well-aerated. Mild bibasilar opacities may reflect mild pneumonia. No definite pleural effusion or pneumothorax is seen. The cardiomediastinal silhouette is mildly enlarged. No acute osseous abnormalities are seen. IMPRESSION: Mild bibasilar opacities may reflect mild pneumonia, given the patient's symptoms. Mild cardiomegaly noted. Electronically Signed   By: Roanna Raider M.D.   On: 03/02/2015 23:11    Scheduled Meds: . antiseptic oral rinse  7 mL Mouth Rinse BID  . celecoxib  200 mg Oral Q12H  . docusate sodium  100 mg Oral BID  . fesoterodine  4 mg Oral Daily  . insulin aspart  0-9 Units Subcutaneous 6 times per day  . lisinopril  20 mg Oral Daily  . pantoprazole (PROTONIX) IV  40 mg Intravenous Q24H  . senna  1 tablet Oral BID  . sertraline  100 mg Oral Daily  . traZODone  50 mg Oral QHS    Continuous Infusions: . sodium chloride 10 mL/hr at 03/03/15 1020  . heparin 2,050 Units/hr (03/03/15 1418)     Time spent: 15 minutes   Hollice Espy  [facility] Pager [phone] . If 7PM-7AM, please contact night-coverage at [email or web address], password [facility] 03/03/2015, 6:35 PM  LOS: 5 days

## 2015-03-03 NOTE — Progress Notes (Signed)
Pt taken off bipap at 07:25 and placed on 6L [place]. RT will continue to monitor and wean O2 as tolerated.

## 2015-03-03 NOTE — Op Note (Signed)
NAME:  Phillip Butler, Phillip Butler NO.:  [id number]  MEDICAL RECORD NO.:  [id number]  LOCATION:  2M14C                        FACILITY:  [facility]  PHYSICIAN:  Toni Arthurs, MD        DATE OF BIRTH:  07-28-1960  DATE OF PROCEDURE:  03/02/2015 DATE OF DISCHARGE:                              OPERATIVE REPORT   PREOPERATIVE DIAGNOSIS:  Left ankle trimalleolar fracture, dislocation.  POSTOPERATIVE DIAGNOSIS:  Left ankle trimalleolar fracture, dislocation.  PROCEDURES: 1. Open reduction and internal fixation of left ankle trimalleolar     fracture. 2. Stress examination of the left ankle under fluoroscopy. 3. AP, mortise and lateral radiographs of the left ankle.  SURGEON:  Toni Arthurs, MD  ANESTHESIA:  General, regional.  ESTIMATED BLOOD LOSS:  Minimal.  TOURNIQUET TIME:  54 minutes at 250 mmHg.  COMPLICATIONS:  None apparent.  DISPOSITION:  Extubated, awake, and stable to recovery.  INDICATIONS FOR PROCEDURE:  The patient is a 55 year old male with past medical history significant for autism and bipolar disorder.  He fell earlier this week injuring his left ankle in his yard.  He underwent closed reduction of his trimalleolar fracture, dislocation in the emergency department.  He was admitted since he could not function in the nonweightbearing status independently.  He presents today for ORIF of the left ankle fracture.  He understands the risks and benefits, the alternative treatment options, and elects surgical treatment.  He specifically understands the risks of bleeding, infection, nerve damage, blood clots, need for additional surgery, continued pain, nonunion, amputation and death.  PROCEDURE IN DETAIL:  After preoperative consent was obtained and the correct operative site was identified, the patient was brought to the operating room and placed supine on the operating table.  General anesthesia was induced.  Preoperative antibiotics were administered. Surgical  time-out was taken.  Left lower extremity was prepped and draped in standard sterile fashion with tourniquet around the thigh. The extremity was exsanguinated and tourniquet was inflated to 250 mmHg. A longitudinal incision was made over the lateral malleolus.  Sharp dissection was carried down through the skin and subcutaneous tissue. The fracture site was identified, was cleaned of all hematoma and irrigated copiously.  Fracture was reduced and held with a tenaculum.  A 3.5-mm fully-threaded lag screw from the [employer] small frag set was inserted from posterior to anterior and was noted to have excellent purchase.  A 7-hole one-third tubular plate was then contoured to fit the lateral malleolus.  It was secured distally with three unicortical screws and proximally with three bicortical screws.  AP, mortise, and lateral radiographs confirmed appropriate reduction of the fracture and appropriate position and length of all screws.  Attention was then turned to the medial malleolus where a longitudinal incision was made.  Sharp dissection was carried down through the skin and subcutaneous tissue.  Fracture site was identified.  Fracture site was opened and cleaned of all hematoma.  Posteriorly, a fragment of bone was removed from the posterior tibial tendon sheath.  The medial mal fracture was then reduced and pinned with K-wires.  AP, mortise and lateral radiographs confirmed appropriate reduction of the fracture. Two 4-mm partially-threaded cannulated screws were  inserted over the guidewires and were both noted to have excellent purchase.  AP, mortise, and lateral radiographs confirmed appropriate position and length of both screws and appropriate reduction of the fracture site.  Stress examination was then performed with dorsiflexion and external rotation stress under live fluoroscopy.  No widening of the ankle mortise was noted.  Both wounds were then irrigated copiously.  The  posterior malleolus fracture was reduced appropriately without fixation.  The deep subcutaneous tissues were approximated with Vicryl, superficial subcutaneous tissues were approximated with Monocryl and the skin incision was closed with nylon.  Sterile dressings were applied followed by a well-padded short-leg splint.  Tourniquet was released after application of the dressings at 51 minutes.  The patient was awakened from anesthesia and transported to the recovery room in stable condition.  FOLLOWUP PLAN:  The patient will be nonweightbearing on the left lower extremity.  He will resume DVT treatment tomorrow, he will be discharged to skilled nursing facility upon bed availability.  RADIOGRAPHS:  AP, mortise, and lateral radiographs of the left ankle were obtained intraoperatively.  These show interval reduction and fixation of the medial and lateral malleolus fractures.  Alignment was grossly normal.  No other acute injuries are noted.     Toni Arthurs, MD     JH/MEDQ  D:  03/02/2015  T:  03/03/2015  Job:  [id number]

## 2015-03-03 NOTE — Progress Notes (Signed)
OT Cancellation Note  Patient Details Name: Phillip Butler MRN: [id number] DOB: 05/26/1960   Cancelled Treatment:    Reason Eval/Treat Not Completed: Patient not medically ready pt currently with new DVT s/p ORIF L ankle  Harolyn Rutherford  Pager: [phone]  03/03/2015, 1:20 PM

## 2015-03-03 NOTE — Care Management Note (Signed)
Case Management Note  Patient Details  Name: Phillip Butler MRN: [id number] Date of Birth: 10/31/1960  Subjective/Objective:    Pt is s/p Post procedure 03/02/15 :  OPEN REDUCTION INTERNAL FIXATION (ORIF) LEFT ANKLE TRIMALLEOLAR FRACTURE 2.  Stress exam of left ankle under fluoro 3.  AP, mortise and lateral xrays of the left ankle        Pt experienced respiratory event and subsequently placed on BIPAP and transferred to ICU                 Action/Plan:  Pt will discharge to SNF, CSW already following    Expected Discharge Date:                  Expected Discharge Plan:  Skilled Nursing Facility  In-House Referral:  Clinical Social Work  Discharge planning Services  CM Consult  Post Acute Care Choice:    Choice offered to:     DME Arranged:    DME Agency:     HH Arranged:    HH Agency:     Status of Service:  In process, will continue to follow  Medicare Important Message Given:  Yes Date Medicare IM Given:    Medicare IM give by:    Date Additional Medicare IM Given:    Additional Medicare Important Message give by:     If discussed at Long Length of Stay Meetings, dates discussed:    Additional Comments:  Cherylann Parr, RN 03/03/2015, 2:07 PM

## 2015-03-03 NOTE — Progress Notes (Signed)
ANTICOAGULATION CONSULT NOTE - Follow Up Consult  Pharmacy Consult for heparin Indication: DVT   Labs:  Recent Labs  03/01/15 0637 03/01/15 1505 03/02/15 0722 03/02/15 2328 03/03/15 0455 03/03/15 0550  HGB 11.0*  --  11.0*  --  10.5*  --   HCT 34.2*  --  33.4*  --  33.7*  --   PLT 225  --  263  --  260  --   APTT  --   --   --  25  --   --   LABPROT  --   --   --  13.9  --   --   INR  --   --   --  1.05  --   --   HEPARINUNFRC 0.70 0.48 0.57  --   --  0.28*  CREATININE  --   --   --   --  0.83  --   TROPONINI  --   --   --  1.02*  --   --      Assessment/Plan:  55yo male slightly subtherapeutic on heparin after resumed but was previously therapeutic at this rate and likely needs more time to accumulate. Will continue gtt at current rate and confirm stable with additional level.   Vernard Gambles, PharmD, BCPS  03/03/2015,6:55 AM

## 2015-03-03 NOTE — Progress Notes (Signed)
ANTICOAGULATION CONSULT NOTE - Follow-up Consult  Pharmacy Consult for heparin Indication: DVT  Allergies  Allergen Reactions  . Fluoxetine Hcl     REACTION: ineffective    Patient Measurements: Height:  (175.3 cm) Weight: 265 lb 4.8 oz (120.339 kg) IBW/kg (Calculated) : 70.7 Heparin Dosing Weight: 98 kg  Vital Signs: Temp: 97.7 F (36.5 C) (02/10 2105) Temp Source: Oral (02/10 2105) BP: 157/93 mmHg (02/10 2105) Pulse Rate: 110 (02/10 2105)  Labs:  Recent Labs  03/01/15 1610  03/02/15 0722 03/02/15 2328 03/03/15 0455 03/03/15 0550 03/03/15 1235 03/03/15 2020  HGB 11.0*  --  11.0*  --  10.5*  --   --   --   HCT 34.2*  --  33.4*  --  33.7*  --   --   --   PLT 225  --  263  --  260  --   --   --   APTT  --   --   --  25  --   --   --   --   LABPROT  --   --   --  13.9  --   --   --   --   INR  --   --   --  1.05  --   --   --   --   HEPARINUNFRC 0.70  < > 0.57  --   --  0.28* 0.24* 0.48  CREATININE  --   --   --   --  0.83  --   --   --   TROPONINI  --   --   --  1.02*  --   --   --   --   < > = values in this interval not displayed.  Estimated Creatinine Clearance: 128.7 mL/min (by C-G formula based on Cr of 0.83).  Assessment: 55 yo m presenting s/p fall with ankle fracture. Now with DVT in LLE.  Heparin infusion increased earlier today in response to low level. HL now therapeutic at 0.48 units/mL. No bleeding noted.  Goal of Therapy:  Heparin level 0.3-0.7 units/ml Monitor platelets by anticoagulation protocol: Yes   Plan:  Continue heparin infusion at 2050 units/hr Confirm HL with AM labs Daily heparin level and CBC F/u long term AC plans  Gaelan Glennon D. Aricela Bertagnolli, PharmD, BCPS Clinical Pharmacist Pager: [phone] 03/03/2015 9:19 PM

## 2015-03-04 ENCOUNTER — Inpatient Hospital Stay (HOSPITAL_COMMUNITY): Payer: Medicare Other

## 2015-03-04 DIAGNOSIS — I5032 Chronic diastolic (congestive) heart failure: Secondary | ICD-10-CM

## 2015-03-04 DIAGNOSIS — R0600 Dyspnea, unspecified: Secondary | ICD-10-CM

## 2015-03-04 LAB — GLUCOSE, CAPILLARY
GLUCOSE-CAPILLARY: 105 mg/dL — AB (ref 65–99)
GLUCOSE-CAPILLARY: 110 mg/dL — AB (ref 65–99)
GLUCOSE-CAPILLARY: 98 mg/dL (ref 65–99)
GLUCOSE-CAPILLARY: 99 mg/dL (ref 65–99)
Glucose-Capillary: 89 mg/dL (ref 65–99)
Glucose-Capillary: 83 mg/dL (ref 65–99)

## 2015-03-04 LAB — CBC
HCT: 33.4 % — ABNORMAL LOW (ref 39.0–52.0)
Hemoglobin: 10.7 g/dL — ABNORMAL LOW (ref 13.0–17.0)
MCH: 29.5 pg (ref 26.0–34.0)
MCHC: 32 g/dL (ref 30.0–36.0)
MCV: 92 fL (ref 78.0–100.0)
PLATELETS: 261 10*3/uL (ref 150–400)
RBC: 3.63 MIL/uL — AB (ref 4.22–5.81)
RDW: 13.4 % (ref 11.5–15.5)
WBC: 6.5 10*3/uL (ref 4.0–10.5)

## 2015-03-04 LAB — HEPARIN LEVEL (UNFRACTIONATED): HEPARIN UNFRACTIONATED: 0.52 [IU]/mL (ref 0.30–0.70)

## 2015-03-04 LAB — BRAIN NATRIURETIC PEPTIDE: B Natriuretic Peptide: 49.6 pg/mL (ref 0.0–100.0)

## 2015-03-04 MED ORDER — RIVAROXABAN 15 MG PO TABS
15.0000 mg | ORAL_TABLET | Freq: Two times a day (BID) | ORAL | Status: DC
  Administered 2015-03-04 – 2015-03-06 (×6): 15 mg via ORAL
  Filled 2015-03-04 (×8): qty 1

## 2015-03-04 MED ORDER — RIVAROXABAN 20 MG PO TABS: 20.0000 mg | ORAL_TABLET | Freq: Every day | ORAL | Status: DC

## 2015-03-04 MED ORDER — PANTOPRAZOLE SODIUM 40 MG PO TBEC
40.0000 mg | DELAYED_RELEASE_TABLET | Freq: Every day | ORAL | Status: DC
  Administered 2015-03-04 – 2015-03-05 (×2): 40 mg via ORAL
  Filled 2015-03-04 (×2): qty 1

## 2015-03-04 NOTE — Progress Notes (Signed)
PROGRESS NOTE  Phillip Butler ZOX:[id number] DOB: [birth year] DOA: 02/26/2015 PCP: Oliver Barre, MD  HPI/Recap of past 77 hours: 55 year old male with past oral history of obstructive sleep apnea, autism and hypertension admitted on 2/5 after being unable to bear weight after a fall on his left foot and found to have a left ankle trimalleolar fracture.during workup, patient found to have left calf DVT. Hospitalist consulted.  Patient started on IV heparin prior to surgery.  Patient taken to operating room for repair on evening/9 without incident. However, in recovery, oxygen saturations low patient transferred to ICU. Felt to have residual anesthesia interacting with sleep apnea causing some respiratory failure. Patient stabilized and now on room air, transferred to medical floor on 2/10 in evening  Patient today with no complaints. Echocardiogram done noted grade 2 diastolic dysfunction-new diagnosis  Assessment/Plan: Active Problems:   Fracture of ankle, trimalleolar, right, closed: Status post repair    Autism: medical decisions per family   Acute respiratory failure: Likely residual anesthesia in conjunction with sleep apnea. Resolved    DVT, lower extremity, distal, acute (HCC): Now postop, started on xarelto  Chronic diastolic heart failure: Noted on echocardiogram. Already on ACE inhibitor. Checking BMP plus daily weights.  Code Status: full code   Family Communication: Left message with patient's brother  Disposition Plan: potential skilled nursing versus inpatient rehabilitation, awaiting PT eval after surgery    Consultants:  hospitalists  Critical care  Procedures:  ORIF of left ankle 2/9   Antibiotics:  none   Objective: BP 156/86 mmHg  Pulse 102  Temp(Src) 98.2 F (36.8 C) (Oral)  Resp 18  Ht  (1.753 m)  Wt 120.339 kg (265 lb 4.8 oz)  BMI 39.16 kg/m2  SpO2 98%  Intake/Output Summary (Last 24 hours) at 03/04/15 1520 Last data filed at 03/04/15  1404  Gross per 24 hour  Intake    600 ml  Output   1100 ml  Net   -500 ml   Filed Weights   02/26/15 1750 02/27/15 0108  Weight: 107.502 kg (237 lb) 120.339 kg (265 lb 4.8 oz)    Exam: Unchanged from previous  General:  Alert and oriented 2, no acute distress   Cardiovascular: regular rate and rhythm, S1-S2   Respiratory: clear to auscultation bilaterally   Abdomen: soft, obese, nontender, positive bowel sounds   Musculoskeletal: Left foot wrapped  Data Reviewed: Basic Metabolic Panel:  Recent Labs Lab 02/26/15 2209 02/26/15 2218 03/03/15 0455  NA  --  141 141  K  --  4.5 5.0  CL  --  105 103  CO2  --   --  27  GLUCOSE  --  154* 113*  BUN  --  24* 16  CREATININE 0.90 0.80 0.83  CALCIUM  --   --  8.6*   Liver Function Tests:  Recent Labs Lab 03/03/15 0455  AST 28  ALT 23  ALKPHOS 94  BILITOT 0.8  PROT 7.0  ALBUMIN 3.1*   No results for input(s): LIPASE, AMYLASE in the last 168 hours. No results for input(s): AMMONIA in the last 168 hours. CBC:  Recent Labs Lab 02/26/15 2209 02/26/15 2218 03/01/15 8119 03/02/15 0722 03/03/15 0455 03/04/15 0614  WBC 9.9  --  6.6 6.3 8.2 6.5  NEUTROABS 8.3*  --   --   --  6.3  --   HGB 12.8* 14.3 11.0* 11.0* 10.5* 10.7*  HCT 38.6* 42.0 34.2* 33.4* 33.7* 33.4*  MCV 88.3  --  92.4 89.8 91.6 92.0  PLT 266  --  225 263 260 261   Cardiac Enzymes:    Recent Labs Lab 03/02/15 2328  TROPONINI 1.02*   BNP (last 3 results) No results for input(s): BNP in the last 8760 hours.  ProBNP (last 3 results) No results for input(s): PROBNP in the last 8760 hours.  CBG:  Recent Labs Lab 03/03/15 2102 03/04/15 0016 03/04/15 0532 03/04/15 0745 03/04/15 1137  GLUCAP 112* 105* 89 98 83    Recent Results (from the past 240 hour(s))  Surgical pcr screen     Status: None   Collection Time: 02/27/15 10:40 AM  Result Value Ref Range Status   MRSA, PCR NEGATIVE NEGATIVE Final   Staphylococcus aureus NEGATIVE  NEGATIVE Final    Comment:        The Xpert SA Assay (FDA approved for NASAL specimens in patients over 53 years of age), is one component of a comprehensive surveillance program.  Test performance has been validated by [facility] for patients greater than or equal to 82 year old. It is not intended to diagnose infection nor to guide or monitor treatment.      Studies: No results found.  Scheduled Meds: . antiseptic oral rinse  7 mL Mouth Rinse BID  . celecoxib  200 mg Oral Q12H  . docusate sodium  100 mg Oral BID  . fesoterodine  4 mg Oral Daily  . insulin aspart  0-9 Units Subcutaneous 6 times per day  . lisinopril  20 mg Oral Daily  . pantoprazole  40 mg Oral QHS  . rivaroxaban  15 mg Oral BID WC   Followed by  . [START ON 03/25/2015] rivaroxaban  20 mg Oral Q supper  . senna  1 tablet Oral BID  . sertraline  100 mg Oral Daily  . traZODone  50 mg Oral QHS    Continuous Infusions: . sodium chloride 10 mL/hr at 03/03/15 2343     Time spent: 15 minutes   Hollice Espy  [facility] Pager [phone] . If 7PM-7AM, please contact night-coverage at [email or web address], password [facility] 03/04/2015, 3:20 PM  LOS: 6 days

## 2015-03-04 NOTE — Progress Notes (Signed)
  Echocardiogram 2D Echocardiogram has been performed.  Delcie Roch 03/04/2015, 9:38 AM

## 2015-03-04 NOTE — Progress Notes (Signed)
Physical Therapy Treatment Patient Details Name: Phillip Butler MRN: [id number] DOB: 12/10/1960 Today's Date: 03/04/2015    History of Present Illness 54 yo with PMHx of autism, depression, GERD, hyperlipidemia, anxiety, HTN who fell at home with left ankle fx s/p closed reduction and splinting    PT Comments    Patient progressing slowly with ambulation due to fatigues quickly and demonstrates inefficient pattern even though able to maintain NWB. Feel SNF level rehab still appropriate.  Will continue skilled PT in acute setting.  Follow Up Recommendations  SNF;Supervision/Assistance - 24 hour     Equipment Recommendations  Rolling walker with 5" wheels    Recommendations for Other Services       Precautions / Restrictions Precautions Precautions: Fall Restrictions LLE Weight Bearing: Non weight bearing    Mobility  Bed Mobility Overal bed mobility: Needs Assistance Bed Mobility: Supine to Sit     Supine to sit: Supervision     General bed mobility comments: increased time, HOB about 30  Transfers Overall transfer level: Needs assistance Equipment used: Rolling walker (2 wheeled) Transfers: Sit to/from Stand Sit to Stand: Min assist         General transfer comment: cues for hand placement, to slowlly lower to chair  Ambulation/Gait Ambulation/Gait assistance: Min assist Ambulation Distance (Feet): 25 Feet (and 20') Assistive device: Rolling walker (2 wheeled) Gait Pattern/deviations: Step-to pattern     General Gait Details: assist/cues for walker positioning, taking bigger hops for more efficiency and keeping walker still while stepping into it.  Seated rest between trials   Stairs            Wheelchair Mobility    Modified Rankin (Stroke Patients Only)       Balance     Sitting balance-Leahy Scale: Good     Standing balance support: Bilateral upper extremity supported Standing balance-Leahy Scale: Poor Standing balance comment:  assist and UE support for balance                    Cognition Arousal/Alertness: Awake/alert Behavior During Therapy: Flat affect Overall Cognitive Status: No family/caregiver present to determine baseline cognitive functioning                      Exercises General Exercises - Lower Extremity Heel Slides: AROM;Both;10 reps;Supine Straight Leg Raises: AROM;Both;5 reps;Supine    General Comments        Pertinent Vitals/Pain Pain Score: 3  Pain Location: foot Pain Descriptors / Indicators: Aching Pain Intervention(s): Repositioned;Patient requesting pain meds-RN notified    Home Living                      Prior Function            PT Goals (current goals can now be found in the care plan section) Progress towards PT goals: Progressing toward goals    Frequency  Min 3X/week    PT Plan Current plan remains appropriate    Co-evaluation             End of Session Equipment Utilized During Treatment: Gait belt Activity Tolerance: Patient tolerated treatment well Patient left: in chair;with call bell/phone within reach     Time: 1159-1224 PT Time Calculation (min) (ACUTE ONLY): 25 min  Charges:  $Gait Training: 23-37 mins                    G Codes:  Phillip Butler 03/04/2015, 4:28 PM  Sheran Lawless, PT [phone] 03/04/2015

## 2015-03-04 NOTE — Progress Notes (Signed)
ANTICOAGULATION CONSULT NOTE - Initial Consult  Pharmacy Consult for Heparin>> Xarelto Indication: DVT  Allergies  Allergen Reactions  . Fluoxetine Hcl     REACTION: ineffective    Patient Measurements: Height:  (175.3 cm) Weight: 265 lb 4.8 oz (120.339 kg) IBW/kg (Calculated) : 70.7   Vital Signs: Temp: 98 F (36.7 C) (02/11 0643) Temp Source: Oral (02/11 0643) BP: 162/96 mmHg (02/11 0643) Pulse Rate: 96 (02/11 0643)  Labs:  Recent Labs  03/02/15 0722 03/02/15 2328 03/03/15 0455  03/03/15 1235 03/03/15 2020 03/04/15 0614  HGB 11.0*  --  10.5*  --   --   --  10.7*  HCT 33.4*  --  33.7*  --   --   --  33.4*  PLT 263  --  260  --   --   --  261  APTT  --  25  --   --   --   --   --   LABPROT  --  13.9  --   --   --   --   --   INR  --  1.05  --   --   --   --   --   HEPARINUNFRC 0.57  --   --   < > 0.24* 0.48 0.52  CREATININE  --   --  0.83  --   --   --   --   TROPONINI  --  1.02*  --   --   --   --   --   < > = values in this interval not displayed.  Estimated Creatinine Clearance: 128.7 mL/min (by C-G formula based on Cr of 0.83).   Assessment: 41 YOM with DVT in LLE. To transition from heparin to Xarelto for treatment.  SCr 0.8, CrCL >154mL/min. Hgb 10.7, plts 261- stable. Heparin level has been therapeutic x2.   Goal of Therapy:  proper dosing based on renal function Monitor platelets by anticoagulation protocol: Yes   Plan:  -continue heparin at 2050units/hr until 1200, then stop -Xarelto  PO BIDwc to start today at 1200 (when heparin is turned off)- continue for 21 days, then on 03/25/2015 transition to Xarelto  PO qsupper -CBC q72h -education with patient and caretakers prior to discharge  Zain Bingman D. Arnette Driggs, PharmD, BCPS Clinical Pharmacist Pager: [phone] 03/04/2015 10:23 AM

## 2015-03-04 NOTE — Progress Notes (Signed)
Subjective: 2 Days Post-Op Procedure(s) (LRB): OPEN REDUCTION INTERNAL FIXATION (ORIF) LEFT ANKLE TRIMALLEOLAR FRACTURE (Left) Patient reports pain as 2 on 0-10 scale.   No complaints no sob no cp. Objective: Vital signs in last 24 hours: Temp:  [97.7 F (36.5 C)-98.7 F (37.1 C)] 98 F (36.7 C) (02/11 0643) Pulse Rate:  [96-124] 96 (02/11 0643) Resp:  [18-24] 18 (02/11 0643) BP: (142-162)/(76-96) 162/96 mmHg (02/11 0643) SpO2:  [91 %-98 %] 98 % (02/11 0643)  Intake/Output from previous day: 02/10 0701 - 02/11 0700 In: 737.3 [P.O.:480; I.V.:257.3] Out: 1775 [Urine:1775] Intake/Output this shift: Total I/O In: -  Out: 100 [Urine:100]   Recent Labs  03/02/15 0722 03/03/15 0455 03/04/15 0614  HGB 11.0* 10.5* 10.7*    Recent Labs  03/03/15 0455 03/04/15 0614  WBC 8.2 6.5  RBC 3.68* 3.63*  HCT 33.7* 33.4*  PLT 260 261    Recent Labs  03/03/15 0455  NA 141  K 5.0  CL 103  CO2 27  BUN 16  CREATININE 0.83  GLUCOSE 113*  CALCIUM 8.6*    Recent Labs  03/02/15 2328  INR 1.05    Intact pulses distally splint intact Nice and dry  Assessment/Plan: 2 Days Post-Op Procedure(s) (LRB): OPEN REDUCTION INTERNAL FIXATION (ORIF) LEFT ANKLE TRIMALLEOLAR FRACTURE (Left) Discharge to SNF on Monday  Continue current orders  Byrne Capek ANDREW 03/04/2015, 10:21 AM

## 2015-03-04 NOTE — Anesthesia Postprocedure Evaluation (Signed)
Anesthesia Post Note  Patient: Phillip Butler  Procedure(s) Performed: Procedure(s) (LRB): OPEN REDUCTION INTERNAL FIXATION (ORIF) LEFT ANKLE TRIMALLEOLAR FRACTURE (Left)  Patient location during evaluation: PACU Anesthesia Type: General Level of consciousness: awake and awake and alert Pain management: pain level controlled Vital Signs Assessment: post-procedure vital signs reviewed and stable Respiratory status: spontaneous breathing and nonlabored ventilation Anesthetic complications: no    Last Vitals:  Filed Vitals:   03/04/15 0643 03/04/15 1416  BP: 162/96 156/86  Pulse: 96 102  Temp: 36.7 C 36.8 C  Resp: 18 18    Last Pain:  Filed Vitals:   03/04/15 1752  PainSc: Asleep                 Bethene Hankinson COKER

## 2015-03-05 LAB — GLUCOSE, CAPILLARY
GLUCOSE-CAPILLARY: 112 mg/dL — AB (ref 65–99)
GLUCOSE-CAPILLARY: 124 mg/dL — AB (ref 65–99)
GLUCOSE-CAPILLARY: 90 mg/dL (ref 65–99)
Glucose-Capillary: 100 mg/dL — ABNORMAL HIGH (ref 65–99)
Glucose-Capillary: 138 mg/dL — ABNORMAL HIGH (ref 65–99)
Glucose-Capillary: 110 mg/dL — ABNORMAL HIGH (ref 65–99)

## 2015-03-05 MED ORDER — FUROSEMIDE 10 MG/ML IJ SOLN
20.0000 mg | Freq: Once | INTRAMUSCULAR | Status: AC
  Administered 2015-03-05: 20 mg via INTRAVENOUS
  Filled 2015-03-05: qty 2

## 2015-03-05 NOTE — Progress Notes (Signed)
   Subjective: 3 Days Post-Op Procedure(s) (LRB): OPEN REDUCTION INTERNAL FIXATION (ORIF) LEFT ANKLE TRIMALLEOLAR FRACTURE (Left) Patient reports pain as mild.   Patient seen in rounds for Dr. Victorino Dike Patient is well, and has had no acute complaints or problems. Voiding well. Reports BM yesterday. No SOB or chest pain. Reports desire to go home after DC instead of SNF.   Objective: Vital signs in last 24 hours: Temp:  [98 F (36.7 C)-98.2 F (36.8 C)] 98 F (36.7 C) (02/12 0538) Pulse Rate:  [87-103] 87 (02/12 0538) Resp:  [18] 18 (02/12 0538) BP: (142-156)/(81-91) 154/91 mmHg (02/12 0538) SpO2:  [92 %-98 %] 92 % (02/12 0538) Weight:  [124.286 kg (274 lb)] 124.286 kg (274 lb) (02/11 1700)  Intake/Output from previous day:  Intake/Output Summary (Last 24 hours) at 03/05/15 0826 Last data filed at 03/05/15 0539  Gross per 24 hour  Intake   1050 ml  Output   1550 ml  Net   -500 ml     Labs:  Recent Labs  03/03/15 0455 03/04/15 0614  HGB 10.5* 10.7*    Recent Labs  03/03/15 0455 03/04/15 0614  WBC 8.2 6.5  RBC 3.68* 3.63*  HCT 33.7* 33.4*  PLT 260 261    Recent Labs  03/03/15 0455  NA 141  K 5.0  CL 103  CO2 27  BUN 16  CREATININE 0.83  GLUCOSE 113*  CALCIUM 8.6*    Recent Labs  03/02/15 2328  INR 1.05    EXAM General - Patient is Alert and Oriented Extremity - Neurologically intact Sensation intact distally Dressing/Incision - clean, dry, no drainage   Past Medical History  Diagnosis Date  . Depression   . GERD (gastroesophageal reflux disease)   . Hyperlipidemia   . Anxiety   . Hypertension   . Allergic rhinitis   . OSA (obstructive sleep apnea)   . 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 Anemia of chronic disease   . Autism     Assessment/Plan: 3 Days Post-Op Procedure(s) (LRB): OPEN REDUCTION INTERNAL FIXATION (ORIF) LEFT ANKLE TRIMALLEOLAR FRACTURE (Left) Principal Problem:   Closed trimalleolar fracture of left ankle Active  Problems:   Chronic diastolic heart failure (HCC)   Autism   DVT, lower extremity, distal, acute (HCC)   Obesity (BMI 30-39.9)   Calf pain   Respiratory abnormality  Estimated body mass index is 40.44 kg/(m^2) as calculated from the following:   Height as of this encounter:  (1.753 m).   Weight as of this encounter: 124.286 kg (274 lb). Advance diet Plan for discharge tomorrow  DVT Prophylaxis - Xarelto NWB left LE  Plan for DC to SNF tomorrow. Patient lives alone and is not stable enough to return home alone.    Dimitri Ped, PA-C Orthopaedic Surgery 03/05/2015, 8:26 AM

## 2015-03-06 DIAGNOSIS — J989 Respiratory disorder, unspecified: Secondary | ICD-10-CM | POA: Diagnosis not present

## 2015-03-06 DIAGNOSIS — I824Z2 Acute embolism and thrombosis of unspecified deep veins of left distal lower extremity: Secondary | ICD-10-CM | POA: Diagnosis not present

## 2015-03-06 DIAGNOSIS — M6281 Muscle weakness (generalized): Secondary | ICD-10-CM | POA: Diagnosis not present

## 2015-03-06 DIAGNOSIS — K219 Gastro-esophageal reflux disease without esophagitis: Secondary | ICD-10-CM | POA: Diagnosis not present

## 2015-03-06 DIAGNOSIS — E669 Obesity, unspecified: Secondary | ICD-10-CM | POA: Diagnosis not present

## 2015-03-06 DIAGNOSIS — F418 Other specified anxiety disorders: Secondary | ICD-10-CM | POA: Diagnosis not present

## 2015-03-06 DIAGNOSIS — S82852D Displaced trimalleolar fracture of left lower leg, subsequent encounter for closed fracture with routine healing: Secondary | ICD-10-CM | POA: Diagnosis not present

## 2015-03-06 DIAGNOSIS — M79662 Pain in left lower leg: Secondary | ICD-10-CM | POA: Diagnosis not present

## 2015-03-06 DIAGNOSIS — R2681 Unsteadiness on feet: Secondary | ICD-10-CM | POA: Diagnosis not present

## 2015-03-06 DIAGNOSIS — R195 Other fecal abnormalities: Secondary | ICD-10-CM | POA: Diagnosis not present

## 2015-03-06 DIAGNOSIS — E785 Hyperlipidemia, unspecified: Secondary | ICD-10-CM | POA: Diagnosis not present

## 2015-03-06 DIAGNOSIS — D62 Acute posthemorrhagic anemia: Secondary | ICD-10-CM | POA: Diagnosis not present

## 2015-03-06 DIAGNOSIS — J309 Allergic rhinitis, unspecified: Secondary | ICD-10-CM | POA: Diagnosis not present

## 2015-03-06 DIAGNOSIS — F329 Major depressive disorder, single episode, unspecified: Secondary | ICD-10-CM | POA: Diagnosis not present

## 2015-03-06 DIAGNOSIS — Z4789 Encounter for other orthopedic aftercare: Secondary | ICD-10-CM | POA: Diagnosis not present

## 2015-03-06 DIAGNOSIS — G4700 Insomnia, unspecified: Secondary | ICD-10-CM | POA: Diagnosis not present

## 2015-03-06 DIAGNOSIS — S92909A Unspecified fracture of unspecified foot, initial encounter for closed fracture: Secondary | ICD-10-CM | POA: Diagnosis not present

## 2015-03-06 DIAGNOSIS — R262 Difficulty in walking, not elsewhere classified: Secondary | ICD-10-CM | POA: Diagnosis not present

## 2015-03-06 DIAGNOSIS — G4733 Obstructive sleep apnea (adult) (pediatric): Secondary | ICD-10-CM | POA: Diagnosis not present

## 2015-03-06 DIAGNOSIS — F840 Autistic disorder: Secondary | ICD-10-CM | POA: Diagnosis not present

## 2015-03-06 DIAGNOSIS — H9193 Unspecified hearing loss, bilateral: Secondary | ICD-10-CM | POA: Diagnosis not present

## 2015-03-06 DIAGNOSIS — S82852S Displaced trimalleolar fracture of left lower leg, sequela: Secondary | ICD-10-CM | POA: Diagnosis not present

## 2015-03-06 DIAGNOSIS — I10 Essential (primary) hypertension: Secondary | ICD-10-CM | POA: Diagnosis not present

## 2015-03-06 DIAGNOSIS — N3281 Overactive bladder: Secondary | ICD-10-CM | POA: Diagnosis not present

## 2015-03-06 DIAGNOSIS — I5032 Chronic diastolic (congestive) heart failure: Secondary | ICD-10-CM | POA: Diagnosis not present

## 2015-03-06 DIAGNOSIS — F419 Anxiety disorder, unspecified: Secondary | ICD-10-CM | POA: Diagnosis not present

## 2015-03-06 LAB — GLUCOSE, CAPILLARY
GLUCOSE-CAPILLARY: 105 mg/dL — AB (ref 65–99)
GLUCOSE-CAPILLARY: 118 mg/dL — AB (ref 65–99)
Glucose-Capillary: 91 mg/dL (ref 65–99)
Glucose-Capillary: 88 mg/dL (ref 65–99)
Glucose-Capillary: 130 mg/dL — ABNORMAL HIGH (ref 65–99)

## 2015-03-06 MED ORDER — SENNA 8.6 MG PO TABS: 2.0000 | ORAL_TABLET | Freq: Two times a day (BID) | ORAL | Status: DC

## 2015-03-06 MED ORDER — FUROSEMIDE 10 MG/ML IJ SOLN
20.0000 mg | Freq: Once | INTRAMUSCULAR | Status: AC
Start: 2015-03-06 — End: 2015-03-06
  Administered 2015-03-06: 20 mg via INTRAVENOUS
  Filled 2015-03-06: qty 2

## 2015-03-06 MED ORDER — RIVAROXABAN (XARELTO) VTE STARTER PACK (15 & 20 MG): ORAL_TABLET | ORAL | Status: DC

## 2015-03-06 MED ORDER — CARVEDILOL 6.25 MG PO TABS: 6.2500 mg | ORAL_TABLET | Freq: Two times a day (BID) | ORAL | Status: DC

## 2015-03-06 MED ORDER — OXYCODONE HCL 5 MG PO TABS: 5.0000 mg | ORAL_TABLET | ORAL | Status: DC | PRN

## 2015-03-06 MED ORDER — DOCUSATE SODIUM 100 MG PO CAPS: 100.0000 mg | ORAL_CAPSULE | Freq: Two times a day (BID) | ORAL | Status: DC

## 2015-03-06 NOTE — Discharge Summary (Signed)
Physician Discharge Summary  Patient ID: Phillip Butler MRN: [id number] DOB/AGE: 1960-03-27 55 y.o.  Admit date: 02/26/2015 Discharge date: 03/06/2015  Admission Diagnoses: Left closed trimalleolar fracture; chronic diastolic heart failure; autism; DVT; respiratory abnormality  Discharge Diagnoses:  Principal Problem:   Closed trimalleolar fracture of left ankle Active Problems:   Chronic diastolic heart failure (HCC)   Autism   DVT, lower extremity, distal, acute (HCC)   Obesity (BMI 30-39.9)   Calf pain   Respiratory abnormality same as above  Discharged Condition: stable  Hospital Course: Phillip Butler presented to the [facility] ED on 02/26/2015 after sustaining a fall at home and c/o LLE pain.  Upon arrival radiographs were obtained and patient was diagnosed with a  L trimalleolar fracture.  Dr. Toni Arthurs was then consulted and the patient was admitted to the hospital due to the fact that he has a PMH of autism and lives alone.  The patient's stay was complicated by a diagnosis of LLE DVT.  Pharmacy and medicine team were consulted and patient was treated with heparin.  The patient then underwent ORIF for L trimalleolar fracture by Dr. Toni Arthurs on 03/02/15.  The patient tolerated the procedure well, however he did experience some breathing abnormalities in PACU; the pulmonary and CC team was consulted and the patient was transferred to the ICU.  The patient became stable while on their service and was returned to [facility] for the remainder of his stay.  Internal medicine will D/C him with xarelto for DVT management.  The patient is to be D/C'd to SNF on 03/06/15.  Consults: pulmonary/intensive care and hospitalist  Significant Diagnostic Studies: labs: CBC, PT/INR, heparin trough radiology: X-Ray: to ensure appropriate alignment during operative intervention and cardiac graphics: Echocardiogram: to r/o abnormality after surgical intervention.  Treatments: IV hydration, antibiotics: Ancef,  analgesia: acetaminophen, acetaminophen w/ codeine and Morphine, cardiac meds: lisinopril (Prinivil) and furosemide, anticoagulation: ASA, heparin and xeralto, therapies: PT and OT and surgery: as stated above  Discharge Exam: Blood pressure 163/89, pulse 97, temperature 97.8 F (36.6 C), temperature source Oral, resp. rate 18, height  (1.753 m), weight 123.424 kg (272 lb 1.6 oz), SpO2 96 %. General: WDWN patient in NAD. Psych:  Appropriate mood and affect. Neuro:  A&O x 3, Moving all extremities, sensation intact to light touch HEENT:  EOMs intact Chest:  Even non-labored respirations Skin:  Dressing C/D/I, no rashes or lesions Extremities: warm/dry, no visible edema, erythema, or echymosis.  No lymphadenopathy. Pulses: Popliteus 2+ MSK:  ROM: EHL/FHL intact, MMT: patient can perform quad set   Disposition: 06-Home-Health Care Svc  Discharge Instructions    Call MD / Call 911    Complete by:  As directed   If you experience chest pain or shortness of breath, CALL 911 and be transported to the hospital emergency room.  If you develope a fever above 101 F, pus (white drainage) or increased drainage or redness at the wound, or calf pain, call your surgeon's office.     Constipation Prevention    Complete by:  As directed   Drink plenty of fluids.  Prune juice may be helpful.  You may use a stool softener, such as Colace (over the counter) 100 mg twice a day.  Use MiraLax (over the counter) for constipation as needed.     Diet - low sodium heart healthy    Complete by:  As directed      Increase activity slowly as tolerated    Complete  by:  As directed      Non weight bearing    Complete by:  As directed   Laterality:  right  Extremity:  Lower            Medication List    STOP taking these medications        aspirin 81 MG EC tablet      TAKE these medications        carvedilol 6.25 MG tablet  Commonly known as:  COREG  Take 1 tablet (6.25 mg total) by mouth 2 (two)  times daily with a meal.     docusate sodium 100 MG capsule  Commonly known as:  COLACE  Take 1 capsule (100 mg total) by mouth 2 (two) times daily. While taking narcotic pain medicine.     fesoterodine 4 MG Tb24 tablet  Commonly known as:  TOVIAZ  Take 1 tablet (4 mg total) by mouth daily.     guaiFENesin-dextromethorphan 100-10 MG/5ML syrup  Commonly known as:  ROBITUSSIN DM  Take 5 mLs by mouth every 4 (four) hours as needed for cough.     lisinopril 20 MG tablet  Commonly known as:  PRINIVIL,ZESTRIL  Take 1 tablet (20 mg total) by mouth daily.     omeprazole 20 MG capsule  Commonly known as:  PRILOSEC  Take 1 capsule (20 mg total) by mouth daily.     oxyCODONE 5 MG immediate release tablet  Commonly known as:  ROXICODONE  Take 1-2 tablets (5-10 mg total) by mouth every 4 (four) hours as needed for moderate pain or severe pain.     Rivaroxaban 15 & 20 MG Tbpk  Commonly known as:  XARELTO STARTER PACK  Take as directed on package: Start with one  tablet by mouth twice a day with food. On Day 22, switch to one  tablet once a day with food.     senna 8.6 MG Tabs tablet  Commonly known as:  SENOKOT  Take 2 tablets (17.2 mg total) by mouth 2 (two) times daily.     sertraline 100 MG tablet  Commonly known as:  ZOLOFT  Take 1 tablet (100 mg total) by mouth daily.     traZODone 50 MG tablet  Commonly known as:  DESYREL  TAKE 1 TABLET BY MOUTH AT BEDTIME           Follow-up Information    Follow up with HEWITT, Jonny Ruiz, MD. Schedule an appointment as soon as possible for a visit in 2 weeks.   Specialty:  Orthopedic Surgery   Contact information:   [address] [place] [address] [phone]       Follow up with [facility] .   Specialty:  Skilled Nursing Facility   Contact information:   [address] [place] [address] [phone]      Signed: Alfredo Martinez, Cordelia Poche, ATC [facility] Orthopaedics Office:   [phone]

## 2015-03-06 NOTE — Clinical Social Work Note (Signed)
Patient to be d/c'ed today to [facility].  Patient and family agreeable to plans will transport via ems RN to call report to [phone].  Rica Heather, MSW, LCSWA [phone]  

## 2015-03-06 NOTE — Discharge Instructions (Addendum)
Phillip Arthurs, MD [facility] Orthopaedics  Please read the following information regarding your care after surgery.  Medications  You only need a prescription for the narcotic pain medicine (ex. oxycodone, Percocet, Norco).  All of the other medicines listed below are available over the counter. X acetominophen (Tylenol) 650 mg every 4-6 hours as you need for minor pain X oxycodone as prescribed for moderate to severe pain ?   Narcotic pain medicine (ex. oxycodone, Percocet, Vicodin) will cause constipation.  To prevent this problem, take the following medicines while you are taking any pain medicine. X docusate sodium (Colace) 100 mg twice a day X senna (Senokot) 2 tablets twice a day  X To help prevent blood clots, take an aspirin (325 mg) once a day for a month after surgery.  You should also get up every hour while you are awake to move around.    Weight Bearing X Do not bear any weight on the operated leg or foot.  Cast / Splint / Dressing X Keep your splint or cast clean and dry.  Dont put anything (coat hanger, pencil, etc) down inside of it.  If it gets damp, use a hair dryer on the cool setting to dry it.  If it gets soaked, call the office to schedule an appointment for a cast change.  After your dressing, cast or splint is removed; you may shower, but do not soak or scrub the wound.  Allow the water to run over it, and then gently pat it dry.  Swelling It is normal for you to have swelling where you had surgery.  To reduce swelling and pain, keep your toes above your nose for at least 3 days after surgery.  It may be necessary to keep your foot or leg elevated for several weeks.  If it hurts, it should be elevated.  Follow Up Call my office at [phone] when you are discharged from the hospital or surgery center to schedule an appointment to be seen two weeks after surgery.  Call my office at [phone] if you develop a fever >101.5 F, nausea, vomiting, bleeding from the  surgical site or severe pain.    ___________________________________ Information on my medicine - XARELTO (rivaroxaban)  This medication education was reviewed with me or my healthcare representative as part of my discharge preparation.  The pharmacist that spoke with me during my hospital stay was:  Luka Stohr, Judie Bonus, [facility]  WHY WAS XARELTO PRESCRIBED FOR YOU? Xarelto was prescribed to treat blood clots that may have been found in the veins of your legs (deep vein thrombosis) or in your lungs (pulmonary embolism) and to reduce the risk of them occurring again.  What do you need to know about Xarelto? The starting dose is one 15 mg tablet taken TWICE daily with food for the FIRST 21 DAYS then on (enter date)  03/25/15  the dose is changed to one 20 mg tablet taken ONCE A DAY with your evening meal.  DO NOT stop taking Xarelto without talking to the health care provider who prescribed the medication.  Refill your prescription for 20 mg tablets before you run out.  After discharge, you should have regular check-up appointments with your healthcare provider that is prescribing your Xarelto.  In the future your dose may need to be changed if your kidney function changes by a significant amount.  What do you do if you miss a dose? If you are taking Xarelto TWICE DAILY and you miss a dose, take it  as soon as you remember. You may take two 15 mg tablets (total 30 mg) at the same time then resume your regularly scheduled 15 mg twice daily the next day.  If you are taking Xarelto ONCE DAILY and you miss a dose, take it as soon as you remember on the same day then continue your regularly scheduled once daily regimen the next day. Do not take two doses of Xarelto at the same time.   Important Safety Information Xarelto is a blood thinner medicine that can cause bleeding. You should call your healthcare provider right away if you experience any of the following: ? Bleeding from an injury or  your nose that does not stop. ? Unusual colored urine (red or dark brown) or unusual colored stools (red or black). ? Unusual bruising for unknown reasons. ? A serious fall or if you hit your head (even if there is no bleeding).  Some medicines may interact with Xarelto and might increase your risk of bleeding while on Xarelto. To help avoid this, consult your healthcare provider or pharmacist prior to using any new prescription or non-prescription medications, including herbals, vitamins, non-steroidal anti-inflammatory drugs (NSAIDs) and supplements.  This website has more information on Xarelto: VisitDestination.com.br.

## 2015-03-06 NOTE — Progress Notes (Signed)
Physical Therapy Treatment Patient Details Name: Phillip Butler MRN: [id number] DOB: 29-Dec-1960 Today's Date: 03/06/2015    History of Present Illness 55 yo with PMHx of autism, depression, GERD, hyperlipidemia, anxiety, HTN who fell at home with left ankle fx s/p closed reduction and splinting    PT Comments    Pt is progressing well with his mobility and general safety hopping on one leg.  Will see if there is a shoe in his room next session as I think this would help him not fatigue as quickly while hopping on one leg.  PT will continue to follow acutely and pt continues to be appropriate for SNF for rehab at discharge.   Follow Up Recommendations  SNF     Equipment Recommendations  Rolling walker with 5" wheels    Recommendations for Other Services   NA     Precautions / Restrictions Precautions Precautions: Fall Precaution Comments: due to NWB status of left leg.  Restrictions LLE Weight Bearing: Non weight bearing    Mobility  Bed Mobility                  Transfers Overall transfer level: Needs assistance Equipment used: Rolling walker (2 wheeled) Transfers: Sit to/from Stand Sit to Stand: Min assist         General transfer comment: Min assist to support trunk, stabilize RW and encourage NWB status in left leg during transitions (pt, once up is 100% able to maintain NWB left leg, but during transitions, seems to put too much weight on the left leg and needs cues to keep it kicked out in front).    Ambulation/Gait Ambulation/Gait assistance: Min assist;+2 safety/equipment (chair to follow for safety, rest breaks, and incre gait dist) Ambulation Distance (Feet): 30 Feet (x2 with seated rest break.) Assistive device: Rolling walker (2 wheeled) Gait Pattern/deviations: Step-to pattern (hop to pattern) Gait velocity: decreased Gait velocity interpretation: Below normal speed for age/gender General Gait Details: min assist to support trunk for balance.   Pt doing better at staying inside of RW, however, at times needs verbal cues and manual cues to slow down or decrease his hop length for safety.           Balance Overall balance assessment: Needs assistance Sitting-balance support: Feet supported;Bilateral upper extremity supported Sitting balance-Leahy Scale: Good     Standing balance support: Bilateral upper extremity supported Standing balance-Leahy Scale: Poor                      Cognition Arousal/Alertness: Awake/alert Behavior During Therapy: Flat affect Overall Cognitive Status: History of cognitive impairments - at baseline (autistic)                             Pertinent Vitals/Pain Pain Assessment: 0-10 Pain Score: 3  Pain Location: left lower leg/ankle Pain Descriptors / Indicators: Aching;Burning;Guarding;Grimacing Pain Intervention(s): Limited activity within patient's tolerance;Monitored during session;Repositioned           PT Goals (current goals can now be found in the care plan section) Acute Rehab PT Goals Patient Stated Goal: to be able to go home Progress towards PT goals: Progressing toward goals    Frequency  Min 3X/week    PT Plan Current plan remains appropriate       End of Session Equipment Utilized During Treatment: Gait belt Activity Tolerance: Patient limited by fatigue Patient left: in chair;with call bell/phone within reach;with chair alarm  set     Time: 1610-9604 PT Time Calculation (min) (ACUTE ONLY): 13 min  Charges:  $Gait Training: 8-22 mins                     Cicilia Clinger B. Sybella Harnish, PT, DPT [phone]   03/06/2015, 3:24 PM

## 2015-03-06 NOTE — Clinical Social Work Note (Signed)
Clinical Social Work Assessment  Patient Details  Name: Phillip Butler MRN: [id number] Date of Birth: November 21, 1960  Date of referral:  03/06/15               Reason for consult:  Facility Placement                Permission sought to share information with:  Facility Medical sales representative, Family Supports Permission granted to share information::  Yes, Verbal Permission Granted  Name::     [name] Lahaie, [phone]  Agency::  SNF admissions  Relationship::     Contact Information:     Housing/Transportation Living arrangements for the past 2 months:  Single Family Home Source of Information:  Patient Patient Interpreter Needed:  None Criminal Activity/Legal Involvement Pertinent to Current Situation/Hospitalization:  No - Comment as needed Significant Relationships:  Other Family Members Lives with:  Relatives Do you feel safe going back to the place where you live?  No (Patient needs some short term rehab before he can return back home.) Need for family participation in patient care:  Yes (Comment) (Patient has autism and would like family to help with decision making)  Care giving concerns: Patient needs some short term rehab before he can return back home.   Social Worker assessment / plan:  Patient is a pleasant 55 year old male who lives with his brother.  Patient has Autism but is alert and oriented x4.  Patient's sister Bonita Quin [phone] was at bedside, CSW explained role of CSW and bed search process.  CSW explained to patient and his sister what to expect with SNF placement.  CSW explained to patient and his family how insurance pays for his stay and answered questions they had.  Patient and his family did not have any other questions.  Employment status:  Disabled (Comment on whether or not currently receiving Disability) Insurance information:  Medicare, Medicaid In [place] PT Recommendations:  Skilled Nursing Facility Information / Referral to community resources:   Skilled Nursing Facility  Patient/Family's Response to care:  Patient and family agreeable to going to SNF for short term rehab.  Patient/Family's Understanding of and Emotional Response to Diagnosis, Current Treatment, and Prognosis:  Patient and family aware of current treatment and prognosis.  Emotional Assessment Appearance:  Appears stated age Attitude/Demeanor/Rapport:    Affect (typically observed):  Appropriate, Pleasant, Stable Orientation:  Oriented to Self, Oriented to Place, Oriented to  Time, Oriented to Situation Alcohol / Substance use:  Not Applicable Psych involvement (Current and /or in the community):  No (Comment)  Discharge Needs  Concerns to be addressed:  No discharge needs identified Readmission within the last 30 days:  No Current discharge risk:  None Barriers to Discharge:  No Barriers Identified   Darleene Cleaver, LCSWA 03/06/2015, 3:09 PM

## 2015-03-06 NOTE — Progress Notes (Signed)
PROGRESS NOTE  Phillip Butler:[id number] DOB: 11-24-1960 DOA: 02/26/2015 PCP: Oliver Barre, MD  HPI/Recap of past 21 hours: 55 year old male with past oral history of obstructive sleep apnea, autism and hypertension admitted on 2/5 after being unable to bear weight after a fall on his left foot and found to have a left ankle trimalleolar fracture.during workup, patient found to have left calf DVT. Hospitalist consulted.  Patient started on IV heparin prior to surgery.  Patient taken to operating room for repair on evening/9 without incident. However, in recovery, oxygen saturations low patient transferred to ICU. Felt to have residual anesthesia interacting with sleep apnea causing some respiratory failure. Patient stabilized and now on room air, transferred to medical floor on 2/10 in evening  Echocardiogram done noted grade 2 diastolic dysfunction-new diagnosis.  Patient continues to have no complaints  Assessment/Plan: Active Problems:   Fracture of ankle, trimalleolar, right, closed: Status post repair    Autism: medical decisions per family   Acute respiratory failure: Likely residual anesthesia in conjunction with sleep apnea. Resolved    DVT, lower extremity, distal, acute (HCC): Now postop, started on xarelto, have updated MAR discharge med rec with xarelto starter pack  Chronic diastolic heart failure: Noted on echocardiogram. Already on ACE inhibitor. BNP normal. Have updated discharge med rec with Coreg and suspect his elevated blood pressures in the last few days have been from mild volume overload so have given a couple of doses of IV Lasix. No need for Lasix on discharge  Code Status: full code   Family Communication: No family present  Disposition Plan: Will need skilled nursing facility, discharge as per orthopedic surgery   Consultants:  hospitalists  Critical care  Procedures:  ORIF of left ankle 2/9   Antibiotics:  none   Objective: BP 163/89 mmHg   Pulse 97  Temp(Src) 97.8 F (36.6 C) (Oral)  Resp 18  Ht  (1.753 m)  Wt 123.424 kg (272 lb 1.6 oz)  BMI 40.16 kg/m2  SpO2 96%  Intake/Output Summary (Last 24 hours) at 03/06/15 1024 Last data filed at 03/06/15 0753  Gross per 24 hour  Intake    480 ml  Output   1970 ml  Net  -1490 ml   Filed Weights   02/27/15 0108 03/04/15 1700 03/05/15 2109  Weight: 120.339 kg (265 lb 4.8 oz) 124.286 kg (274 lb) 123.424 kg (272 lb 1.6 oz)    Exam:   General:  Alert and oriented 2, no acute distress   Cardiovascular: regular rate and rhythm, S1-S2   Respiratory: clear to auscultation bilaterally   Abdomen: soft, obese, nontender, positive bowel sounds   Musculoskeletal: Left foot wrapped  Data Reviewed: Basic Metabolic Panel:  Recent Labs Lab 03/03/15 0455  NA 141  K 5.0  CL 103  CO2 27  GLUCOSE 113*  BUN 16  CREATININE 0.83  CALCIUM 8.6*   Liver Function Tests:  Recent Labs Lab 03/03/15 0455  AST 28  ALT 23  ALKPHOS 94  BILITOT 0.8  PROT 7.0  ALBUMIN 3.1*   No results for input(s): LIPASE, AMYLASE in the last 168 hours. No results for input(s): AMMONIA in the last 168 hours. CBC:  Recent Labs Lab 03/01/15 0637 03/02/15 0722 03/03/15 0455 03/04/15 0614  WBC 6.6 6.3 8.2 6.5  NEUTROABS  --   --  6.3  --   HGB 11.0* 11.0* 10.5* 10.7*  HCT 34.2* 33.4* 33.7* 33.4*  MCV 92.4 89.8 91.6 92.0  PLT 225  263 260 261   Cardiac Enzymes:    Recent Labs Lab 03/02/15 2328  TROPONINI 1.02*   BNP (last 3 results)  Recent Labs  03/04/15 1600  BNP 49.6    ProBNP (last 3 results) No results for input(s): PROBNP in the last 8760 hours.  CBG:  Recent Labs Lab 03/05/15 1604 03/05/15 2111 03/06/15 0146 03/06/15 0404 03/06/15 0844  GLUCAP 112* 110* 91 105* 118*    Recent Results (from the past 240 hour(s))  Surgical pcr screen     Status: None   Collection Time: 02/27/15 10:40 AM  Result Value Ref Range Status   MRSA, PCR NEGATIVE NEGATIVE  Final   Staphylococcus aureus NEGATIVE NEGATIVE Final    Comment:        The Xpert SA Assay (FDA approved for NASAL specimens in patients over 32 years of age), is one component of a comprehensive surveillance program.  Test performance has been validated by [facility] for patients greater than or equal to 39 year old. It is not intended to diagnose infection nor to guide or monitor treatment.      Studies: No results found.  Scheduled Meds: . antiseptic oral rinse  7 mL Mouth Rinse BID  . docusate sodium  100 mg Oral BID  . fesoterodine  4 mg Oral Daily  . furosemide  20 mg Intravenous Once  . insulin aspart  0-9 Units Subcutaneous 6 times per day  . lisinopril  20 mg Oral Daily  . pantoprazole  40 mg Oral QHS  . rivaroxaban  15 mg Oral BID WC   Followed by  . [START ON 03/25/2015] rivaroxaban  20 mg Oral Q supper  . senna  1 tablet Oral BID  . sertraline  100 mg Oral Daily  . traZODone  50 mg Oral QHS    Continuous Infusions: . sodium chloride 10 mL/hr at 03/04/15 2100     Time spent: 15 minutes   Hollice Espy  [facility] Pager [phone] . If 7PM-7AM, please contact night-coverage at [email or web address], password [facility] 03/06/2015, 10:24 AM  LOS: 8 days

## 2015-03-06 NOTE — Care Management Important Message (Signed)
Important Message  Patient Details  Name: Phillip Butler MRN: [id number] Date of Birth: 02-01-1960   Medicare Important Message Given:  Yes    Rosaland Shiffman P Deunte Bledsoe 03/06/2015, 1:50 PM

## 2015-03-06 NOTE — Progress Notes (Signed)
Pending discharge, patient waiting for transportation via ambulance.

## 2015-03-06 NOTE — Progress Notes (Signed)
Patient for Discharge to [facility]. Report given using SBAR to [facility]. All questions were answered.

## 2015-03-07 ENCOUNTER — Encounter: Payer: Self-pay | Admitting: Internal Medicine

## 2015-03-07 ENCOUNTER — Non-Acute Institutional Stay (SKILLED_NURSING_FACILITY): Payer: Medicare Other | Admitting: Internal Medicine

## 2015-03-07 DIAGNOSIS — I10 Essential (primary) hypertension: Secondary | ICD-10-CM

## 2015-03-07 DIAGNOSIS — K219 Gastro-esophageal reflux disease without esophagitis: Secondary | ICD-10-CM

## 2015-03-07 DIAGNOSIS — D62 Acute posthemorrhagic anemia: Secondary | ICD-10-CM

## 2015-03-07 DIAGNOSIS — R2681 Unsteadiness on feet: Secondary | ICD-10-CM

## 2015-03-07 DIAGNOSIS — S82852S Displaced trimalleolar fracture of left lower leg, sequela: Secondary | ICD-10-CM | POA: Diagnosis not present

## 2015-03-07 DIAGNOSIS — R195 Other fecal abnormalities: Secondary | ICD-10-CM | POA: Diagnosis not present

## 2015-03-07 DIAGNOSIS — F418 Other specified anxiety disorders: Secondary | ICD-10-CM | POA: Diagnosis not present

## 2015-03-07 DIAGNOSIS — I824Z2 Acute embolism and thrombosis of unspecified deep veins of left distal lower extremity: Secondary | ICD-10-CM

## 2015-03-07 DIAGNOSIS — N3281 Overactive bladder: Secondary | ICD-10-CM

## 2015-03-07 NOTE — Progress Notes (Signed)
Patient ID: NEVADA MULLETT, male   DOB: 08-01-1960, 55 y.o.   MRN: [id number]    LOCATION: [facility]  PCP: Oliver Barre, MD   Code Status: Full Code  Goals of care: Advanced Directive information Advanced Directives 02/26/2015  Does patient have an advance directive? No  Would patient like information on creating an advanced directive? -       Extended Emergency Contact Information Primary Emergency Contact: Tanney,Linda Address: [address]          [place], [place] Mobile Phone: [phone] Relation: Sister Secondary Emergency Contact: Brother [place] Mobile Phone: [phone] Relation: Other   Allergies  Allergen Reactions  . Fluoxetine Hcl     REACTION: ineffective    Chief Complaint  Patient presents with  . New Admit To SNF    New Admissions     HPI:  Patient is a 55 y.o. male seen today for short term rehabilitation post hospital admission from 02/26/15-03/06/15 with left closed trimalleolar fracture. He underwent ORIF. He had DVT of LLE and was started on heparin and later switched to xarelto. He is seen in his room today. His pain medication has been helpful. He has loose stool. Denies any other concern.   Review of Systems:  Constitutional: Negative for fever, chills HENT: Negative for headache, congestion, nasal discharge Eyes: Negative for blurred vision, double vision and discharge.  Respiratory: Negative for cough, shortness of breath and wheezing.   Cardiovascular: Negative for chest pain, palpitations, leg swelling.  Gastrointestinal: Negative for heartburn, nausea, vomiting, abdominal pain Genitourinary: Negative for dysuria Musculoskeletal: Negative for fall in the facility Skin: Negative for itching, rash.  Neurological: Negative for dizziness Psychiatric/Behavioral: Negative for depression   Past Medical History  Diagnosis Date  . Depression   . GERD (gastroesophageal reflux  disease)   . Hyperlipidemia   . Anxiety   . Hypertension   . Allergic rhinitis   . OSA (obstructive sleep apnea)   . 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 Anemia of chronic disease   . Autism    Past Surgical History  Procedure Laterality Date  . No past surgeries    . Orif ankle fracture Left 03/02/2015    Procedure: OPEN REDUCTION INTERNAL FIXATION (ORIF) LEFT ANKLE TRIMALLEOLAR FRACTURE;  Surgeon: Toni Arthurs, MD;  Location: [facility] OR;  Service: Orthopedics;  Laterality: Left;   Social History:   reports that he has never smoked. He has never used smokeless tobacco. He reports that he does not drink alcohol or use illicit drugs.  Family History  Problem Relation Age of Onset  . Alcohol abuse    . Arthritis    . Breast cancer    . Prostate cancer    . Hyperlipidemia    . Heart disease    . Stroke    . Kidney disease    . Diabetes    . Pancreatic cancer    . Heart disease Father     Medications:   Medication List       This list is accurate as of: 03/07/15 12:13 PM.  Always use your most recent med list.               carvedilol 6.25 MG tablet  Commonly known as:  COREG  Take 1 tablet (6.25 mg total) by mouth 2 (two) times daily with a meal.     docusate sodium 100 MG capsule  Commonly known as:  COLACE  Take 1 capsule (100 mg total) by mouth 2 (two) times daily. While taking narcotic pain medicine.     fesoterodine 4 MG Tb24 tablet  Commonly known as:  TOVIAZ  Take 1 tablet (4 mg total) by mouth daily.     guaiFENesin-dextromethorphan 100-10 MG/5ML syrup  Commonly known as:  ROBITUSSIN DM  Take 5 mLs by mouth every 4 (four) hours as needed for cough.     lisinopril 20 MG tablet  Commonly known as:  PRINIVIL,ZESTRIL  Take 1 tablet (20 mg total) by mouth daily.     omeprazole 20 MG capsule  Commonly known as:  PRILOSEC  Take 1 capsule (20 mg total) by mouth daily.     oxyCODONE 5 MG immediate release tablet  Commonly known as:  ROXICODONE  Take 1-2  tablets (5-10 mg total) by mouth every 4 (four) hours as needed for moderate pain or severe pain.     Rivaroxaban 15 & 20 MG Tbpk  Commonly known as:  XARELTO STARTER PACK  Take as directed on package: Start with one 15mg  tablet by mouth twice a day with food. On Day 22, switch to one 20mg  tablet once a day with food.     senna 8.6 MG Tabs tablet  Commonly known as:  SENOKOT  Take 2 tablets (17.2 mg total) by mouth 2 (two) times daily.     sertraline 100 MG tablet  Commonly known as:  ZOLOFT  Take 1 tablet (100 mg total) by mouth daily.     traZODone 50 MG tablet  Commonly known as:  DESYREL  TAKE 1 TABLET BY MOUTH AT BEDTIME        Immunizations: Immunization History  Administered Date(s) Administered  . Influenza,inj,Quad PF,36+ Mos 02/20/2014  . PPD Test 03/06/2015  . Td 01/22/2004  . Tdap 02/21/2014     Physical Exam: Filed Vitals:   03/07/15 1159  BP: 156/86  Pulse: 100  Temp: 97.8 F (36.6 C)  TempSrc: Oral  Resp: 20  Height: 5\' 9"  (1.753 m)  Weight: 265 lb (120.203 kg)  SpO2: 98%   Body mass index is 39.12 kg/(m^2).  General- adult, obese male, in no acute distress Head- normocephalic, atraumatic Nose- no maxillary or frontal sinus tenderness, no nasal discharge Throat- moist mucus membrane  Eyes- PERRLA, EOMI, no pallor, no icterus, no discharge, normal conjunctiva, normal sclera Neck- no cervical lymphadenopathy Cardiovascular- normal s1,s2, no murmurs Respiratory- bilateral clear to auscultation, no wheeze, no rhonchi, no crackles, no use of accessory muscles Abdomen- bowel sounds present, soft, non tender Musculoskeletal- able to move all 4 extremities, left foot in cast with ace wrap, NWB to LLE Neurological- alert and oriented to person, place and time Skin- warm and dry Psychiatry- normal mood and affect    Labs reviewed: Basic Metabolic Panel:  Recent Labs  16/10/96 2209 02/26/15 2218 03/03/15 0455  NA  --  141 141  K  --  4.5 5.0    CL  --  105 103  CO2  --   --  27  GLUCOSE  --  154* 113*  BUN  --  24* 16  CREATININE 0.90 0.80 0.83  CALCIUM  --   --  8.6*   Liver Function Tests:  Recent Labs  03/03/15 0455  AST 28  ALT 23  ALKPHOS 94  BILITOT 0.8  PROT 7.0  ALBUMIN 3.1*   No results for input(s): LIPASE, AMYLASE in the last 8760 hours. No results for input(s): AMMONIA in the  last 8760 hours. CBC:  Recent Labs  02/26/15 2209  03/02/15 0722 03/03/15 0455 03/04/15 0614  WBC 9.9  < > 6.3 8.2 6.5  NEUTROABS 8.3*  --   --  6.3  --   HGB 12.8*  < > 11.0* 10.5* 10.7*  HCT 38.6*  < > 33.4* 33.7* 33.4*  MCV 88.3  < > 89.8 91.6 92.0  PLT 266  < > 263 260 261  < > = values in this interval not displayed. Cardiac Enzymes:  Recent Labs  03/02/15 2328  TROPONINI 1.02*   BNP: Invalid input(s): POCBNP CBG:  Recent Labs  03/06/15 0844 03/06/15 1133 03/06/15 1705  GLUCAP 118* 88 130*    Radiological Exams: Dg Ankle Complete Left  02/26/2015  CLINICAL DATA:  Slipped and fell in the back yard today. Left foot and ankle pain. EXAM: LEFT ANKLE COMPLETE - 3+ VIEW COMPARISON:  None. FINDINGS: There is a trimalleolar fracture dislocation. The talar dome is dislocated laterally and posteriorly relative to the tibial plafond. There is a displaced distal fibular metaphysis fracture with 11 mm of lateral displacement. There is a displaced and rotated medial malleolar fracture with approximately 10 mm of displacement. There is severe surrounding soft tissue swelling. IMPRESSION: Trimalleolar left ankle fracture and dislocation. Electronically Signed   By: Elige Ko   On: 02/26/2015 18:28   Dg Chest Port 1 View  03/02/2015  CLINICAL DATA:  Acute onset of respiratory difficulty. Initial encounter. EXAM: PORTABLE CHEST 1 VIEW COMPARISON:  Chest radiograph performed 03/03/2014 FINDINGS: The lungs are well-aerated. Mild bibasilar opacities may reflect mild pneumonia. No definite pleural effusion or pneumothorax is  seen. The cardiomediastinal silhouette is mildly enlarged. No acute osseous abnormalities are seen. IMPRESSION: Mild bibasilar opacities may reflect mild pneumonia, given the patient's symptoms. Mild cardiomegaly noted. Electronically Signed   By: Roanna Raider M.D.   On: 03/02/2015 23:11   Dg Ankle Left Port  02/26/2015  CLINICAL DATA:  55 year old male status post reduction of the left ankle fracture dislocation. EXAM: PORTABLE LEFT ANKLE - 2 VIEW COMPARISON:  Earlier radiograph dated 02/26/2015 FINDINGS: Evaluation is limited due to overlying cast. There has been interval reduction of the previously seen trimalleolar fracture dislocation of the left ankle. IMPRESSION: Interval reduction of the previously seen left ankle fracture/dislocation. Electronically Signed   By: Elgie Collard M.D.   On: 02/26/2015 20:17   Dg Foot Complete Left  02/26/2015  CLINICAL DATA:  Fall. EXAM: LEFT FOOT - COMPLETE 3+ VIEW COMPARISON:  None. FINDINGS: Fracture dislocation of the ankle.  See separate ankle report No other fracture in the foot. Mild degenerative change in the first MTP. IMPRESSION: Fracture dislocation of the ankle. Electronically Signed   By: Marlan Palau M.D.   On: 02/26/2015 18:29    Assessment/Plan  Unsteady gait Will have patient work with PT/OT as tolerated to regain strength and restore function.  Fall precautions are in place.  Left closed trimalleolar fracture S/p ORIF. Has follow up with orthopedics. NWB to LLE. Will have him work with physical therapy and occupational therapy team to help with gait training and muscle strengthening exercises.fall precautions. Skin care. Encourage to be out of bed. Continue oxyIR 5 mg 1-2 tab q4h prn pain and xarelto for dvt prophylaxis.   Left lower extremity DVT Continue xarelto 15 mg bid for 22 more days, then 20 mg daily and monitor  Blood loss anemia Post op, monitor cbc  HTN Elevated BP, check BP/HR bid for now. Continue  coreg 6.25 mg bid,  lisinopril 20 mg daily  Loose stool Discontinue senna and change colace to 100 mg daily for now and increase to bid in few days if needed  OAB Continue toviaz  gerd Continue prilosec 20 mg daily  Depression Continue zoloft with trazodone current regimen   Goals of care: short term rehabilitation   Labs/tests ordered: cbc  Family/ staff Communication: reviewed care plan with patient and nursing supervisor    Oneal Grout, MD Internal Medicine [facility] [address] [place], [place] [address] Cell Phone (Monday-Friday 8 am - 5 pm): [phone] On Call: [phone] and follow prompts after 5 pm and on weekends Office Phone: [phone] Office Fax: [phone]

## 2015-03-08 LAB — HEPATIC FUNCTION PANEL
ALT: 18 U/L (ref 10–40)
AST: 16 U/L (ref 14–40)
Alkaline Phosphatase: 105 U/L (ref 25–125)
Bilirubin, Total: 0.6 mg/dL

## 2015-03-08 LAB — CBC AND DIFFERENTIAL
HCT: 35 % — AB (ref 41–53)
Hemoglobin: 11.1 g/dL — AB (ref 13.5–17.5)
Neutrophils Absolute: 4 /uL
PLATELETS: 368 10*3/uL (ref 150–399)
WBC: 6.2 10*3/mL

## 2015-03-08 LAB — BASIC METABOLIC PANEL
BUN: 22 mg/dL — AB (ref 4–21)
CREATININE: 0.9 mg/dL (ref 0.6–1.3)
GLUCOSE: 103 mg/dL
Potassium: 4.4 mmol/L (ref 3.4–5.3)
Sodium: 140 mmol/L (ref 137–147)

## 2015-03-16 ENCOUNTER — Encounter: Payer: Self-pay | Admitting: Adult Health

## 2015-03-16 ENCOUNTER — Non-Acute Institutional Stay (SKILLED_NURSING_FACILITY): Payer: Medicare Other | Admitting: Adult Health

## 2015-03-16 DIAGNOSIS — S82852S Displaced trimalleolar fracture of left lower leg, sequela: Secondary | ICD-10-CM | POA: Diagnosis not present

## 2015-03-16 DIAGNOSIS — I824Z2 Acute embolism and thrombosis of unspecified deep veins of left distal lower extremity: Secondary | ICD-10-CM

## 2015-03-16 DIAGNOSIS — G4700 Insomnia, unspecified: Secondary | ICD-10-CM | POA: Diagnosis not present

## 2015-03-16 DIAGNOSIS — F840 Autistic disorder: Secondary | ICD-10-CM | POA: Diagnosis not present

## 2015-03-16 DIAGNOSIS — K219 Gastro-esophageal reflux disease without esophagitis: Secondary | ICD-10-CM | POA: Diagnosis not present

## 2015-03-16 DIAGNOSIS — N3281 Overactive bladder: Secondary | ICD-10-CM | POA: Diagnosis not present

## 2015-03-16 DIAGNOSIS — S82852D Displaced trimalleolar fracture of left lower leg, subsequent encounter for closed fracture with routine healing: Secondary | ICD-10-CM | POA: Diagnosis not present

## 2015-03-16 DIAGNOSIS — I10 Essential (primary) hypertension: Secondary | ICD-10-CM

## 2015-03-16 NOTE — Progress Notes (Signed)
Patient ID: Phillip Ohm., male   DOB: 01-01-1961, 55 y.o.   MRN: [id number]    DATE:  03/16/2015   MRN:  [id number]  BIRTHDAY: [birth year]  Facility:  Nursing Home Location:  [facility]  Nursing Home Room Number: 402-1  LEVEL OF CARE:  SNF (31)  Contact Information    Name Relation Home Work Mobile   Criado,Linda Sister   [phone]   Rociero,Roberco Relative   [phone]       Code Status History    Date Active Date Inactive Code Status Order ID Comments User Context   02/26/2015  9:56 PM 03/06/2015 11:39 PM Full Code [id number]  Toni Arthurs, MD ED   02/19/2014 12:11 AM 02/22/2014  4:40 PM Full Code [id number]  Therisa Doyne, MD Inpatient       Chief Complaint  Patient presents with  . Discharge Note    HISTORY OF PRESENT ILLNESS:  This is a 55 year old male who is for discharge home with OT for ADLs, PT for endurance, skilled nurse for disease management CNA for showers. DME:  Standard wheelchair and 3 in 1 commode. He has been admitted to [facility] on 03/06/15 from [facility]. He has PMH of depression, GERD, hyperlipidemia, anxiety, hypertension, allergic rhinitis, bladder neck obstruction and autism. He fell while ambulating in his backyard sustaining a left trimalleolar ankle fracture for which he had ORIF on 03/02/15. He, then, was diagnosed with LLE DVT and was put on Xarelto.  Patient was admitted to this facility for short-term rehabilitation after the patient's recent hospitalization.  Patient has completed SNF rehabilitation and therapy has cleared the patient for discharge.  PAST MEDICAL HISTORY:  Past Medical History  Diagnosis Date  . Depression   . GERD (gastroesophageal reflux disease)   . Hyperlipidemia   . Anxiety   . Hypertension   . Allergic rhinitis   . OSA (obstructive sleep apnea)   . 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 Anemia of chronic disease   . Autism      CURRENT MEDICATIONS: Reviewed  Patient's  Medications  New Prescriptions   No medications on file  Previous Medications   CARVEDILOL (COREG) 6.25 MG TABLET    Take 1 tablet (6.25 mg total) by mouth 2 (two) times daily with a meal.   DOCUSATE SODIUM (COLACE) 100 MG CAPSULE    Take 1 capsule (100 mg total) by mouth 2 (two) times daily. While taking narcotic pain medicine.   FESOTERODINE (TOVIAZ) 4 MG TB24 TABLET    Take 1 tablet (4 mg total) by mouth daily.   GUAIFENESIN (ROBITUSSIN) 100 MG/5ML LIQUID    Take 100 mg by mouth every 4 (four) hours as needed for cough.   LISINOPRIL (PRINIVIL,ZESTRIL) 20 MG TABLET    Take 1 tablet (20 mg total) by mouth daily.   OMEPRAZOLE (PRILOSEC) 20 MG CAPSULE    Take 1 capsule (20 mg total) by mouth daily.   OXYCODONE (ROXICODONE) 5 MG IMMEDIATE RELEASE TABLET    Take 1-2 tablets (5-10 mg total) by mouth every 4 (four) hours as needed for moderate pain or severe pain.   RIVAROXABAN (XARELTO) 15 MG TABS TABLET    Take 15 mg by mouth 2 (two) times daily with a meal.   RIVAROXABAN (XARELTO) 20 MG TABS TABLET    Take 20 mg by mouth daily with supper.   SERTRALINE (ZOLOFT) 100 MG TABLET    Take 1 tablet (100 mg total) by mouth daily.  TRAZODONE (DESYREL) 50 MG TABLET    Take 50 mg by mouth at bedtime.  Modified Medications   No medications on file  Discontinued Medications   GUAIFENESIN-DEXTROMETHORPHAN (ROBITUSSIN DM) 100-10 MG/5ML SYRUP    Take 5 mLs by mouth every 4 (four) hours as needed for cough.   RIVAROXABAN (XARELTO STARTER PACK) 15 & 20 MG TBPK    Take as directed on package: Start with one  tablet by mouth twice a day with food. On Day 22, switch to one  tablet once a day with food.   SENNA (SENOKOT) 8.6 MG TABS TABLET    Take 2 tablets (17.2 mg total) by mouth 2 (two) times daily.   TRAZODONE (DESYREL) 50 MG TABLET    TAKE 1 TABLET BY MOUTH AT BEDTIME     Allergies  Allergen Reactions  . Fluoxetine Hcl     REACTION: ineffective     REVIEW OF SYSTEMS:  GENERAL: no change in  appetite, no fatigue, no weight changes, no fever, chills or weakness EYES: Denies change in vision, dry eyes, eye pain, itching or discharge EARS: Denies change in hearing, ringing in ears, or earache NOSE: Denies nasal congestion or epistaxis MOUTH and THROAT: Denies oral discomfort, gingival pain or bleeding, pain from teeth or hoarseness   RESPIRATORY: no cough, SOB, DOE, wheezing, hemoptysis CARDIAC: no chest pain, edema or palpitations GI: no abdominal pain, diarrhea, constipation, heart burn, nausea or vomiting GU: Denies dysuria, frequency, hematuria, incontinence, or discharge PSYCHIATRIC: Denies feeling of depression or anxiety. No report of hallucinations, insomnia, paranoia, or agitation   PHYSICAL EXAMINATION  GENERAL APPEARANCE: Well nourished. In no acute distress. Normal body habitus SKIN:  Skin is warm and dry.   HEAD: Normal in size and contour. No evidence of trauma EYES: Lids open and close normally. No blepharitis, entropion or ectropion. PERRL. Conjunctivae are clear and sclerae are white. Lenses are without opacity EARS: Pinnae are normal. Patient hears normal voice tunes of the examiner MOUTH and THROAT: Lips are without lesions. Oral mucosa is moist and without lesions. Tongue is normal in shape, size, and color and without lesions NECK: supple, trachea midline, no neck masses, no thyroid tenderness, no thyromegaly LYMPHATICS: no LAN in the neck, no supraclavicular LAN RESPIRATORY: breathing is even & unlabored, BS CTAB CARDIAC: RRR, no murmur,no extra heart sounds, no edema GI: abdomen soft, normal BS, no masses, no tenderness, no hepatomegaly, no splenomegaly EXTREMITIES:  Able to move 4 extremities; left foot of cast; able to wiggle toes of left foot PSYCHIATRIC: Alert and oriented to self.  Affect and behavior are appropriate  LABS/RADIOLOGY: Labs reviewed: Basic Metabolic Panel:  Recent Labs  96/04/54 2218 03/03/15 0455 03/08/15  NA 141 141 140  K  4.5 5.0 4.4  CL 105 103  --   CO2  --  27  --   GLUCOSE 154* 113*  --   BUN 24* 16 22*  CREATININE 0.80 0.83 0.9  CALCIUM  --  8.6*  --    Liver Function Tests:  Recent Labs  03/03/15 0455 03/08/15  AST 28 16  ALT 23 18  ALKPHOS 94 105  BILITOT 0.8  --   PROT 7.0  --   ALBUMIN 3.1*  --    CBC:  Recent Labs  02/26/15 2209  03/02/15 0722 03/03/15 0455 03/04/15 0614 03/08/15  WBC 9.9  < > 6.3 8.2 6.5 6.2  NEUTROABS 8.3*  --   --  6.3  --  4  HGB  12.8*  < > 11.0* 10.5* 10.7* 11.1*  HCT 38.6*  < > 33.4* 33.7* 33.4* 35*  MCV 88.3  < > 89.8 91.6 92.0  --   PLT 266  < > 263 260 261 368  < > = values in this interval not displayed.  Cardiac Enzymes:  Recent Labs  03/02/15 2328  TROPONINI 1.02*    CBG:  Recent Labs  03/06/15 0844 03/06/15 1133 03/06/15 1705  GLUCAP 118* 88 130*      Dg Ankle Complete Left  02/26/2015  CLINICAL DATA:  Slipped and fell in the back yard today. Left foot and ankle pain. EXAM: LEFT ANKLE COMPLETE - 3+ VIEW COMPARISON:  None. FINDINGS: There is a trimalleolar fracture dislocation. The talar dome is dislocated laterally and posteriorly relative to the tibial plafond. There is a displaced distal fibular metaphysis fracture with 11 mm of lateral displacement. There is a displaced and rotated medial malleolar fracture with approximately 10 mm of displacement. There is severe surrounding soft tissue swelling. IMPRESSION: Trimalleolar left ankle fracture and dislocation. Electronically Signed   By: Elige Ko   On: 02/26/2015 18:28   Dg Chest Port 1 View  03/02/2015  CLINICAL DATA:  Acute onset of respiratory difficulty. Initial encounter. EXAM: PORTABLE CHEST 1 VIEW COMPARISON:  Chest radiograph performed 03/03/2014 FINDINGS: The lungs are well-aerated. Mild bibasilar opacities may reflect mild pneumonia. No definite pleural effusion or pneumothorax is seen. The cardiomediastinal silhouette is mildly enlarged. No acute osseous abnormalities are  seen. IMPRESSION: Mild bibasilar opacities may reflect mild pneumonia, given the patient's symptoms. Mild cardiomegaly noted. Electronically Signed   By: Roanna Raider M.D.   On: 03/02/2015 23:11   Dg Ankle Left Port  02/26/2015  CLINICAL DATA:  55 year old male status post reduction of the left ankle fracture dislocation. EXAM: PORTABLE LEFT ANKLE - 2 VIEW COMPARISON:  Earlier radiograph dated 02/26/2015 FINDINGS: Evaluation is limited due to overlying cast. There has been interval reduction of the previously seen trimalleolar fracture dislocation of the left ankle. IMPRESSION: Interval reduction of the previously seen left ankle fracture/dislocation. Electronically Signed   By: Elgie Collard M.D.   On: 02/26/2015 20:17   Dg Foot Complete Left  02/26/2015  CLINICAL DATA:  Fall. EXAM: LEFT FOOT - COMPLETE 3+ VIEW COMPARISON:  None. FINDINGS: Fracture dislocation of the ankle.  See separate ankle report No other fracture in the foot. Mild degenerative change in the first MTP. IMPRESSION: Fracture dislocation of the ankle. Electronically Signed   By: Marlan Palau M.D.   On: 02/26/2015 18:29    ASSESSMENT/PLAN:  Left trimalleolar ankle fracture S/P ORIF  - for home health OT, PT, skilled nurse and CNA ; continue oxycodone 5 mg 1-2 tabs by mouth every 4 hours when necessary for pain; LLE non weightbearing; follow-up with Dr. Victorino Dike, orthopedic surgeon  Hypertension - well-controlled; continue lisinopril 20 mg 1 tab by mouth daily and carvedilol 6.25 mg 1 tab by mouth twice a day  Depression - mood is stable; continue sertraline 100 mg 1 tab by mouth daily  Insomnia - stable; continue trazodone 50 mg 1 tab by mouth daily at bedtime  OAB - continue Toviaz ER 4 mg 1 tab PO daily  DVT LLE - continue Xarelto 15 mg 1 tab by mouth twice a day. 03/28/15 then 20 mg daily  Constipation - continue Colace 100 mg 1 capsule by mouth twice a day  Autism - continue supportive care     I have filled out  patient's discharge paperwork and written prescriptions.  Patient will receive home health PT, OT, skilled Nurse and CNA.  DME provided:  Standard wheelchair, 3 in 1 commode  Total discharge time: Greater than 30 minutes  Discharge time involved coordination of the discharge process with social worker, nursing staff and therapy department. Medical justification for home health services/DME verified.    [facility], NP [employer] [phone]

## 2015-03-23 DIAGNOSIS — Z7982 Long term (current) use of aspirin: Secondary | ICD-10-CM | POA: Diagnosis not present

## 2015-03-23 DIAGNOSIS — I10 Essential (primary) hypertension: Secondary | ICD-10-CM | POA: Diagnosis not present

## 2015-03-23 DIAGNOSIS — F419 Anxiety disorder, unspecified: Secondary | ICD-10-CM | POA: Diagnosis not present

## 2015-03-23 DIAGNOSIS — Z9181 History of falling: Secondary | ICD-10-CM | POA: Diagnosis not present

## 2015-03-23 DIAGNOSIS — F840 Autistic disorder: Secondary | ICD-10-CM | POA: Diagnosis not present

## 2015-03-23 DIAGNOSIS — S82852D Displaced trimalleolar fracture of left lower leg, subsequent encounter for closed fracture with routine healing: Secondary | ICD-10-CM | POA: Diagnosis not present

## 2015-03-23 DIAGNOSIS — I5032 Chronic diastolic (congestive) heart failure: Secondary | ICD-10-CM | POA: Diagnosis not present

## 2015-03-25 DIAGNOSIS — Z9181 History of falling: Secondary | ICD-10-CM | POA: Diagnosis not present

## 2015-03-25 DIAGNOSIS — I10 Essential (primary) hypertension: Secondary | ICD-10-CM | POA: Diagnosis not present

## 2015-03-25 DIAGNOSIS — F419 Anxiety disorder, unspecified: Secondary | ICD-10-CM | POA: Diagnosis not present

## 2015-03-25 DIAGNOSIS — S82852D Displaced trimalleolar fracture of left lower leg, subsequent encounter for closed fracture with routine healing: Secondary | ICD-10-CM | POA: Diagnosis not present

## 2015-03-25 DIAGNOSIS — I5032 Chronic diastolic (congestive) heart failure: Secondary | ICD-10-CM | POA: Diagnosis not present

## 2015-03-25 DIAGNOSIS — F840 Autistic disorder: Secondary | ICD-10-CM | POA: Diagnosis not present

## 2015-03-28 ENCOUNTER — Telehealth: Payer: Self-pay | Admitting: Internal Medicine

## 2015-03-28 DIAGNOSIS — I10 Essential (primary) hypertension: Secondary | ICD-10-CM | POA: Diagnosis not present

## 2015-03-28 DIAGNOSIS — F840 Autistic disorder: Secondary | ICD-10-CM | POA: Diagnosis not present

## 2015-03-28 DIAGNOSIS — Z9181 History of falling: Secondary | ICD-10-CM | POA: Diagnosis not present

## 2015-03-28 DIAGNOSIS — S82852D Displaced trimalleolar fracture of left lower leg, subsequent encounter for closed fracture with routine healing: Secondary | ICD-10-CM | POA: Diagnosis not present

## 2015-03-28 DIAGNOSIS — I5032 Chronic diastolic (congestive) heart failure: Secondary | ICD-10-CM | POA: Diagnosis not present

## 2015-03-28 DIAGNOSIS — F419 Anxiety disorder, unspecified: Secondary | ICD-10-CM | POA: Diagnosis not present

## 2015-03-28 NOTE — Telephone Encounter (Signed)
Request verbal orders for medical social worker for community resources.

## 2015-03-28 NOTE — Telephone Encounter (Signed)
Jim physical therapist with [facility] called to let you know  -Pt does not know when next f/u with PCP (it looks like he is way past due to come in)  -Who is Orthopedic  Currently non - weight barring left lower extrimity   -bp elevated at rest 160/90  -Pt reports pain with urination  -Requesting updated med list Fax # [phone]  Rosanne AshingJim can be reached at [phone]

## 2015-03-30 ENCOUNTER — Telehealth: Payer: Self-pay

## 2015-03-30 ENCOUNTER — Encounter (HOSPITAL_BASED_OUTPATIENT_CLINIC_OR_DEPARTMENT_OTHER): Payer: Self-pay | Admitting: Emergency Medicine

## 2015-03-30 ENCOUNTER — Emergency Department (HOSPITAL_BASED_OUTPATIENT_CLINIC_OR_DEPARTMENT_OTHER)
Admission: EM | Admit: 2015-03-30 | Discharge: 2015-03-30 | Disposition: A | Discharge status: Home / Self Care | Payer: Medicare Other | Attending: Emergency Medicine | Admitting: Emergency Medicine

## 2015-03-30 DIAGNOSIS — Z86718 Personal history of other venous thrombosis and embolism: Secondary | ICD-10-CM | POA: Insufficient documentation

## 2015-03-30 DIAGNOSIS — I10 Essential (primary) hypertension: Secondary | ICD-10-CM | POA: Diagnosis not present

## 2015-03-30 DIAGNOSIS — F840 Autistic disorder: Secondary | ICD-10-CM | POA: Insufficient documentation

## 2015-03-30 DIAGNOSIS — K219 Gastro-esophageal reflux disease without esophagitis: Secondary | ICD-10-CM | POA: Diagnosis not present

## 2015-03-30 DIAGNOSIS — F419 Anxiety disorder, unspecified: Secondary | ICD-10-CM | POA: Insufficient documentation

## 2015-03-30 DIAGNOSIS — Z7901 Long term (current) use of anticoagulants: Secondary | ICD-10-CM | POA: Diagnosis not present

## 2015-03-30 DIAGNOSIS — Z87448 Personal history of other diseases of urinary system: Secondary | ICD-10-CM | POA: Insufficient documentation

## 2015-03-30 DIAGNOSIS — R040 Epistaxis: Secondary | ICD-10-CM | POA: Diagnosis not present

## 2015-03-30 DIAGNOSIS — Z8709 Personal history of other diseases of the respiratory system: Secondary | ICD-10-CM | POA: Diagnosis not present

## 2015-03-30 DIAGNOSIS — F329 Major depressive disorder, single episode, unspecified: Secondary | ICD-10-CM | POA: Diagnosis not present

## 2015-03-30 DIAGNOSIS — Z8669 Personal history of other diseases of the nervous system and sense organs: Secondary | ICD-10-CM | POA: Diagnosis not present

## 2015-03-30 DIAGNOSIS — Z79899 Other long term (current) drug therapy: Secondary | ICD-10-CM | POA: Diagnosis not present

## 2015-03-30 DIAGNOSIS — Z862 Personal history of diseases of the blood and blood-forming organs and certain disorders involving the immune mechanism: Secondary | ICD-10-CM | POA: Diagnosis not present

## 2015-03-30 DIAGNOSIS — S82852D Displaced trimalleolar fracture of left lower leg, subsequent encounter for closed fracture with routine healing: Secondary | ICD-10-CM | POA: Diagnosis not present

## 2015-03-30 DIAGNOSIS — K2970 Gastritis, unspecified, without bleeding: Secondary | ICD-10-CM | POA: Diagnosis not present

## 2015-03-30 DIAGNOSIS — R1111 Vomiting without nausea: Secondary | ICD-10-CM | POA: Diagnosis not present

## 2015-03-30 DIAGNOSIS — Z9181 History of falling: Secondary | ICD-10-CM | POA: Diagnosis not present

## 2015-03-30 DIAGNOSIS — I5032 Chronic diastolic (congestive) heart failure: Secondary | ICD-10-CM | POA: Diagnosis not present

## 2015-03-30 MED ORDER — OXYMETAZOLINE HCL 0.05 % NA SOLN
2.0000 | Freq: Once | NASAL | Status: AC
  Administered 2015-03-30: 2 via NASAL
  Filled 2015-03-30: qty 15

## 2015-03-30 NOTE — ED Notes (Signed)
Nosebleed this am   None on arrival

## 2015-03-30 NOTE — ED Notes (Signed)
Pt states he is unable to place wt on left ankle due to being in cast, contacting [facility].

## 2015-03-30 NOTE — ED Provider Notes (Signed)
CSN: [id number]     Arrival date & time 03/30/15  1127 History   First MD Initiated Contact with Patient 03/30/15 1156     Chief Complaint  Patient presents with  . Epistaxis     (Consider location/radiation/quality/duration/timing/severity/associated sxs/prior Treatment) HPI Comments: Patient presents today with a chief complaint of epistaxis.  He reports that his left nostril began bleeding around 9 AM this morning.  He denies any acute injury or trauma.  He reports that the bleeding lasted 2 hours and then resolved without intervention.  He states that he attempted to blow his nose after the bleeding started, but did not apply pressure.  He reports that he is currently on Xarelto daily due to history of DVT.  He denies any other symptoms at this time.  The history is provided by the patient.    Past Medical History  Diagnosis Date  . Depression   . GERD (gastroesophageal reflux disease)   . Hyperlipidemia   . Anxiety   . Hypertension   . Allergic rhinitis   . OSA (obstructive sleep apnea)   . 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 Anemia of chronic disease   . Autism    Past Surgical History  Procedure Laterality Date  . Orif ankle fracture Left 03/02/2015    Procedure: OPEN REDUCTION INTERNAL FIXATION (ORIF) LEFT ANKLE TRIMALLEOLAR FRACTURE;  Surgeon: Toni Arthurs, MD;  Location: [facility] OR;  Service: Orthopedics;  Laterality: Left;   Family History  Problem Relation Age of Onset  . Alcohol abuse    . Arthritis    . Breast cancer    . Prostate cancer    . Hyperlipidemia    . Heart disease    . Stroke    . Kidney disease    . Diabetes    . Pancreatic cancer    . Heart disease Father    Social History  Substance Use Topics  . Smoking status: Never Smoker   . Smokeless tobacco: Never Used  . Alcohol Use: No    Review of Systems  All other systems reviewed and are negative.     Allergies  Fluoxetine hcl  Home Medications   Prior to Admission medications    Medication Sig Start Date End Date Taking? Authorizing Provider  carvedilol (COREG) 6.25 MG tablet Take 1 tablet (6.25 mg total) by mouth 2 (two) times daily with a meal. 03/06/15  Yes Hollice Espy, MD  lisinopril (PRINIVIL,ZESTRIL) 20 MG tablet Take 1 tablet (20 mg total) by mouth daily. 03/03/14  Yes Corwin Levins, MD  omeprazole (PRILOSEC) 20 MG capsule Take 1 capsule (20 mg total) by mouth daily. 03/03/14  Yes Corwin Levins, MD  oxyCODONE (ROXICODONE) 5 MG immediate release tablet Take 1-2 tablets (5-10 mg total) by mouth every 4 (four) hours as needed for moderate pain or severe pain. 03/06/15  Yes Florentina Addison Ollis, PA-C  rivaroxaban (XARELTO) 20 MG TABS tablet Take 20 mg by mouth daily with supper. 03/29/15  Yes Historical Provider, MD  sertraline (ZOLOFT) 100 MG tablet Take 1 tablet (100 mg total) by mouth daily. 03/03/14  Yes Corwin Levins, MD  traZODone (DESYREL) 50 MG tablet Take 50 mg by mouth at bedtime.   Yes Historical Provider, MD  docusate sodium (COLACE) 100 MG capsule Take 1 capsule (100 mg total) by mouth 2 (two) times daily. While taking narcotic pain medicine. 03/06/15   Jacinta Shoe, PA-C  fesoterodine (TOVIAZ) 4 MG TB24 tablet Take 1 tablet (4  mg total) by mouth daily. 03/03/14   Corwin LevinsJames W John, MD  guaiFENesin (ROBITUSSIN) 100 MG/5ML liquid Take 100 mg by mouth every 4 (four) hours as needed for cough.    Historical Provider, MD   BP 143/89 mmHg  Pulse 93  Temp(Src) 97.7 F (36.5 C) (Oral)  Resp 18  Ht 5\' 9"  (1.753 m)  Wt 117.935 kg  BMI 38.38 kg/m2  SpO2 95% Physical Exam  Constitutional: He appears well-developed and well-nourished.  HENT:  Head: Normocephalic and atraumatic.  Nose: No nasal septal hematoma. No epistaxis.  No foreign bodies.  Mouth/Throat: Oropharynx is clear and moist.  No active epistaxis at this time.  Neck: Normal range of motion. Neck supple.  Cardiovascular: Normal rate, regular rhythm and normal heart sounds.   Pulmonary/Chest: Effort  normal and breath sounds normal.  Musculoskeletal: Normal range of motion.  Neurological: He is alert.  Skin: Skin is warm and dry.  Psychiatric: He has a normal mood and affect.  Nursing note and vitals reviewed.   ED Course  Procedures (including critical care time) Labs Review Labs Reviewed - No data to display  Imaging Review No results found. I have personally reviewed and evaluated these images and lab results as part of my medical decision-making.   EKG Interpretation None     1:22 PM Reassessed patient.  No bleeding during ED course.  Patient got up and moved around with no returning of bleeding.  Will send patient home with Afrin he was given in the ED. MDM   Final diagnoses:  None   Patient who is currently on Xarelto presents today with a chief complaint of epistaxis.  No active bleeding during ED course.  Patient given Afrin in the ED and sent home with the medication.  Patient instructed to apply pressure and use Afrin if the bleeding returns.  Patient stable for discharge.  Return precautions given.    Santiago GladHeather Kelyn Koskela, PA-C 03/30/15 1624  Vanetta MuldersScott Zackowski, MD 03/31/15 [phone]

## 2015-03-30 NOTE — Discharge Instructions (Signed)
Use Afrin 2 sprays into the nostril if bleeding returns Nosebleed Nosebleeds are common. They are due to a crack in the inside lining of your nose (mucous membrane) or from a small blood vessel that starts to bleed. Nosebleeds can be caused by many conditions, such as injury, infections, dry mucous membranes or dry climate, medicines, nose picking, and home heating and cooling systems. Most nosebleeds come from blood vessels in the front of your nose. HOME CARE INSTRUCTIONS   Try controlling your nosebleed by pinching your nostrils gently and continuously for at least 10 minutes.  Avoid blowing or sniffing your nose for a number of hours after having a nosebleed.  Do not put gauze inside your nose yourself. If your nose was packed by your health care provider, try to maintain the pack inside of your nose until your health care provider removes it.  If a gauze pack was used and it starts to fall out, gently replace it or cut off the end of it.  If a balloon catheter was used to pack your nose, do not cut or remove it unless your health care provider has instructed you to do that.  Avoid lying down while you are having a nosebleed. Sit up and lean forward.  Use a nasal spray decongestant to help with a nosebleed as directed by your health care provider.  Do not use petroleum jelly or mineral oil in your nose. These can drip into your lungs.  Maintain humidity in your home by using less air conditioning or by using a humidifier.  Aspirinand blood thinners make bleeding more likely. If you are prescribed these medicines and you suffer from nosebleeds, ask your health care provider if you should stop taking the medicines or adjust the dose. Do not stop medicines unless directed by your health care provider  Resume your normal activities as you are able, but avoid straining, lifting, or bending at the waist for several days.  If your nosebleed was caused by dry mucous membranes, use  over-the-counter saline nasal spray or gel. This will keep the mucous membranes moist and allow them to heal. If you must use a lubricant, choose the water-soluble variety. Use it only sparingly, and do not use it within several hours of lying down.  Keep all follow-up visits as directed by your health care provider. This is important. SEEK MEDICAL CARE IF:  You have a fever.  You get frequent nosebleeds.  You are getting nosebleeds more often. SEEK IMMEDIATE MEDICAL CARE IF:  Your nosebleed lasts longer than 20 minutes.  Your nosebleed occurs after an injury to your face, and your nose looks crooked or broken.  You have unusual bleeding from other parts of your body.  You have unusual bruising on other parts of your body.  You feel light-headed or you faint.  You become sweaty.  You vomit blood.  Your nosebleed occurs after a head injury.   This information is not intended to replace advice given to you by your health care provider. Make sure you discuss any questions you have with your health care provider.   Document Released: 10/17/2004 Document Revised: 01/28/2014 Document Reviewed: 08/23/2013 Elsevier Interactive Patient Education [employer]2016 Elsevier Inc.

## 2015-03-30 NOTE — ED Notes (Signed)
PA at bedside.

## 2015-03-30 NOTE — Telephone Encounter (Signed)
[facility]illip [facility] called in to report that patient states he has started vomiting blood this morning---x3 events---per dr Jonny Ruizjohn, patient advised to stop xarelto and go to ED, patient advised to call 911 if he is unable to ambulate/drive himself---patient complied, he will call 911

## 2015-03-31 DIAGNOSIS — I5032 Chronic diastolic (congestive) heart failure: Secondary | ICD-10-CM | POA: Diagnosis not present

## 2015-03-31 DIAGNOSIS — F419 Anxiety disorder, unspecified: Secondary | ICD-10-CM | POA: Diagnosis not present

## 2015-03-31 DIAGNOSIS — F840 Autistic disorder: Secondary | ICD-10-CM | POA: Diagnosis not present

## 2015-03-31 DIAGNOSIS — Z9181 History of falling: Secondary | ICD-10-CM | POA: Diagnosis not present

## 2015-03-31 DIAGNOSIS — I10 Essential (primary) hypertension: Secondary | ICD-10-CM | POA: Diagnosis not present

## 2015-03-31 DIAGNOSIS — S82852D Displaced trimalleolar fracture of left lower leg, subsequent encounter for closed fracture with routine healing: Secondary | ICD-10-CM | POA: Diagnosis not present

## 2015-04-03 DIAGNOSIS — S82852D Displaced trimalleolar fracture of left lower leg, subsequent encounter for closed fracture with routine healing: Secondary | ICD-10-CM | POA: Diagnosis not present

## 2015-04-03 DIAGNOSIS — F840 Autistic disorder: Secondary | ICD-10-CM | POA: Diagnosis not present

## 2015-04-03 DIAGNOSIS — Z9181 History of falling: Secondary | ICD-10-CM | POA: Diagnosis not present

## 2015-04-03 DIAGNOSIS — F419 Anxiety disorder, unspecified: Secondary | ICD-10-CM | POA: Diagnosis not present

## 2015-04-03 DIAGNOSIS — I10 Essential (primary) hypertension: Secondary | ICD-10-CM | POA: Diagnosis not present

## 2015-04-03 DIAGNOSIS — I5032 Chronic diastolic (congestive) heart failure: Secondary | ICD-10-CM | POA: Diagnosis not present

## 2015-04-05 ENCOUNTER — Telehealth: Payer: Self-pay | Admitting: Internal Medicine

## 2015-04-05 DIAGNOSIS — I5032 Chronic diastolic (congestive) heart failure: Secondary | ICD-10-CM | POA: Diagnosis not present

## 2015-04-05 DIAGNOSIS — S82852D Displaced trimalleolar fracture of left lower leg, subsequent encounter for closed fracture with routine healing: Secondary | ICD-10-CM | POA: Diagnosis not present

## 2015-04-05 DIAGNOSIS — F419 Anxiety disorder, unspecified: Secondary | ICD-10-CM | POA: Diagnosis not present

## 2015-04-05 DIAGNOSIS — I10 Essential (primary) hypertension: Secondary | ICD-10-CM | POA: Diagnosis not present

## 2015-04-05 DIAGNOSIS — F840 Autistic disorder: Secondary | ICD-10-CM | POA: Diagnosis not present

## 2015-04-05 DIAGNOSIS — Z9181 History of falling: Secondary | ICD-10-CM | POA: Diagnosis not present

## 2015-04-05 MED ORDER — OXYCODONE HCL 5 MG PO TABS: 5.0000 mg | ORAL_TABLET | ORAL | Status: DC | PRN

## 2015-04-05 NOTE — Telephone Encounter (Signed)
I just called Phillip Butler and scheduled him for next week. He is out of oxyCODONE (ROXICODONE) 5 MG immediate release tablet [[id number]][[id number]] and is requesting a refill. Please call Phillip Butler at [phone] regarding the pain meds.

## 2015-04-05 NOTE — Telephone Encounter (Signed)
Please advise, ok to refill? 

## 2015-04-05 NOTE — Telephone Encounter (Signed)
Rosanne AshingJim a Physical therapist with [facility] called   Pt would benefit with office visit   Pt has one pill left on pain meds Has been out of rehab for approx 2 wks.   Pt would benefit from speech therapy for memory speech cognition Referral for medical social worker for community resources.  Pt has chronic high bp today 160/100 seated Pt went to hosp on 3/10 due to nose bleed  Prime care giver - Regan RakersRoberto [phone]  Please call Rosanne AshingJim at [phone] regarding the referrals  [facility] And to let him know we are requesting refill for pain medication.

## 2015-04-05 NOTE — Telephone Encounter (Signed)
[facility]

## 2015-04-06 DIAGNOSIS — I10 Essential (primary) hypertension: Secondary | ICD-10-CM | POA: Diagnosis not present

## 2015-04-06 DIAGNOSIS — S82852D Displaced trimalleolar fracture of left lower leg, subsequent encounter for closed fracture with routine healing: Secondary | ICD-10-CM | POA: Diagnosis not present

## 2015-04-06 DIAGNOSIS — F419 Anxiety disorder, unspecified: Secondary | ICD-10-CM | POA: Diagnosis not present

## 2015-04-06 DIAGNOSIS — F840 Autistic disorder: Secondary | ICD-10-CM | POA: Diagnosis not present

## 2015-04-06 DIAGNOSIS — I5032 Chronic diastolic (congestive) heart failure: Secondary | ICD-10-CM | POA: Diagnosis not present

## 2015-04-06 DIAGNOSIS — Z9181 History of falling: Secondary | ICD-10-CM | POA: Diagnosis not present

## 2015-04-07 DIAGNOSIS — Z9181 History of falling: Secondary | ICD-10-CM | POA: Diagnosis not present

## 2015-04-07 DIAGNOSIS — I10 Essential (primary) hypertension: Secondary | ICD-10-CM | POA: Diagnosis not present

## 2015-04-07 DIAGNOSIS — S82852D Displaced trimalleolar fracture of left lower leg, subsequent encounter for closed fracture with routine healing: Secondary | ICD-10-CM | POA: Diagnosis not present

## 2015-04-07 DIAGNOSIS — F419 Anxiety disorder, unspecified: Secondary | ICD-10-CM | POA: Diagnosis not present

## 2015-04-07 DIAGNOSIS — I5032 Chronic diastolic (congestive) heart failure: Secondary | ICD-10-CM | POA: Diagnosis not present

## 2015-04-07 DIAGNOSIS — F840 Autistic disorder: Secondary | ICD-10-CM | POA: Diagnosis not present

## 2015-04-10 DIAGNOSIS — I5032 Chronic diastolic (congestive) heart failure: Secondary | ICD-10-CM | POA: Diagnosis not present

## 2015-04-10 DIAGNOSIS — F840 Autistic disorder: Secondary | ICD-10-CM | POA: Diagnosis not present

## 2015-04-10 DIAGNOSIS — I10 Essential (primary) hypertension: Secondary | ICD-10-CM | POA: Diagnosis not present

## 2015-04-10 DIAGNOSIS — Z9181 History of falling: Secondary | ICD-10-CM | POA: Diagnosis not present

## 2015-04-10 DIAGNOSIS — F419 Anxiety disorder, unspecified: Secondary | ICD-10-CM | POA: Diagnosis not present

## 2015-04-10 DIAGNOSIS — S82852D Displaced trimalleolar fracture of left lower leg, subsequent encounter for closed fracture with routine healing: Secondary | ICD-10-CM | POA: Diagnosis not present

## 2015-04-12 ENCOUNTER — Encounter: Payer: Self-pay | Admitting: Internal Medicine

## 2015-04-12 ENCOUNTER — Ambulatory Visit (INDEPENDENT_AMBULATORY_CARE_PROVIDER_SITE_OTHER): Payer: Medicare Other | Admitting: Internal Medicine

## 2015-04-12 ENCOUNTER — Other Ambulatory Visit: Payer: Medicare Other

## 2015-04-12 VITALS — BP 138/84 | HR 94 | Temp 97.0°F | Resp 20 | Wt 260.0 lb

## 2015-04-12 DIAGNOSIS — Z9181 History of falling: Secondary | ICD-10-CM | POA: Diagnosis not present

## 2015-04-12 DIAGNOSIS — F418 Other specified anxiety disorders: Secondary | ICD-10-CM

## 2015-04-12 DIAGNOSIS — I10 Essential (primary) hypertension: Secondary | ICD-10-CM

## 2015-04-12 DIAGNOSIS — I824Z2 Acute embolism and thrombosis of unspecified deep veins of left distal lower extremity: Secondary | ICD-10-CM

## 2015-04-12 DIAGNOSIS — I5032 Chronic diastolic (congestive) heart failure: Secondary | ICD-10-CM | POA: Diagnosis not present

## 2015-04-12 DIAGNOSIS — F840 Autistic disorder: Secondary | ICD-10-CM | POA: Diagnosis not present

## 2015-04-12 DIAGNOSIS — S82852D Displaced trimalleolar fracture of left lower leg, subsequent encounter for closed fracture with routine healing: Secondary | ICD-10-CM | POA: Diagnosis not present

## 2015-04-12 DIAGNOSIS — S82852S Displaced trimalleolar fracture of left lower leg, sequela: Secondary | ICD-10-CM

## 2015-04-12 DIAGNOSIS — F419 Anxiety disorder, unspecified: Secondary | ICD-10-CM | POA: Diagnosis not present

## 2015-04-12 MED ORDER — APIXABAN 5 MG PO TABS: 5.0000 mg | ORAL_TABLET | Freq: Two times a day (BID) | ORAL | Status: DC

## 2015-04-12 NOTE — Patient Instructions (Signed)
OK to stay off the xarelto  Please take all new medication as prescribed - the Eliquis at 5 mg twice per day  Please continue all other medications as before, and refills have been done if requested.  Please have the pharmacy call with any other refills you may need.  Please keep your appointments with your specialists as you may have planned  .Please go to the LAB in the Basement (turn left off the elevator) for the tests to be done today - just the urine test today  You will be contacted by phone if any changes need to be made immediately.  Otherwise, you will receive a letter about your results with an explanation, but please check with MyChart first.  Please remember to sign up for MyChart if you have not done so, as this will be important to you in the future with finding out test results, communicating by private email, and scheduling acute appointments online when needed.  Please return in 3 months, or sooner if needed

## 2015-04-12 NOTE — Progress Notes (Signed)
Pre visit review using our clinic review tool, if applicable. No additional management support is needed unless otherwise documented below in the visit note. 

## 2015-04-12 NOTE — Progress Notes (Signed)
Subjective:    Patient ID: Phillip Butler., male    DOB: [birth year], 55 y.o.   MRN: [id number]  HPI  Here to f/u recent illness with Discharge Diagnoses:  Principal Problem:  Closed trimalleolar fracture of left ankle Active Problems:  Chronic diastolic heart failure (HCC)  Autism  DVT, lower extremity, distal, acute (HCC)  Obesity (BMI 30-39.9 Hospd feb 5-13, then [facility]; In wheelchair at home, non wt bearing, next appt fu with [place] orth[facility] on Friday mar 24.  Mentions xarelto seemed to cause vomiting and nosebleed so stopped on his own about 2 wks ago.  Had to be seen at ED. Is not willing to restart xarelto, but would be ok with eliquis.Pt denies chest pain, increased sob or doe, wheezing, orthopnea, PND, increased LE swelling, palpitations, dizziness or syncope.  No recent overt bleeding  Denies worsening depressive symptoms, suicidal ideation, or panic  Asks for temp parking permit. Past Medical History  Diagnosis Date  . Depression   . GERD (gastroesophageal reflux disease)   . Hyperlipidemia   . Anxiety   . Hypertension   . Allergic rhinitis   . OSA (obstructive sleep apnea)   . 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 Anemia of chronic disease   . Autism    Past Surgical History  Procedure Laterality Date  . Orif ankle fracture Left 03/02/2015    Procedure: OPEN REDUCTION INTERNAL FIXATION (ORIF) LEFT ANKLE TRIMALLEOLAR FRACTURE;  Surgeon: Toni ArthursJohn Hewitt, MD;  Location: [facility] OR;  Service: Orthopedics;  Laterality: Left;    reports that he has never smoked. He has never used smokeless tobacco. He reports that he does not drink alcohol or use illicit drugs. family history includes Heart disease in his father. Allergies  Allergen Reactions  . Fluoxetine Hcl     REACTION: ineffective   Current Outpatient Prescriptions on File Prior to Visit  Medication Sig Dispense Refill  . carvedilol (COREG) 6.25 MG tablet Take 1 tablet (6.25 mg total) by mouth 2 (two)  times daily with a meal. 60 tablet 1  . docusate sodium (COLACE) 100 MG capsule Take 1 capsule (100 mg total) by mouth 2 (two) times daily. While taking narcotic pain medicine. 30 capsule 0  . fesoterodine (TOVIAZ) 4 MG TB24 tablet Take 1 tablet (4 mg total) by mouth daily. 90 tablet 3  . guaiFENesin (ROBITUSSIN) 100 MG/5ML liquid Take 100 mg by mouth every 4 (four) hours as needed for cough.    Marland Kitchen. lisinopril (PRINIVIL,ZESTRIL) 20 MG tablet Take 1 tablet (20 mg total) by mouth daily. 90 tablet 3  . omeprazole (PRILOSEC) 20 MG capsule Take 1 capsule (20 mg total) by mouth daily. 90 capsule 3  . oxyCODONE (ROXICODONE) 5 MG immediate release tablet Take 1-2 tablets (5-10 mg total) by mouth every 4 (four) hours as needed for moderate pain or severe pain. 30 tablet 0  . rivaroxaban (XARELTO) 20 MG TABS tablet Take 20 mg by mouth daily with supper.    . sertraline (ZOLOFT) 100 MG tablet Take 1 tablet (100 mg total) by mouth daily. 90 tablet 3  . traZODone (DESYREL) 50 MG tablet Take 50 mg by mouth at bedtime.     No current facility-administered medications on file prior to visit.   Review of Systems  Constitutional: Negative for unusual diaphoresis or night sweats HENT: Negative for ringing in ear or discharge Eyes: Negative for double vision or worsening visual disturbance.  Respiratory: Negative for choking and stridor.  Gastrointestinal: Negative for vomiting or other signifcant bowel change Genitourinary: Negative for hematuria or change in urine volume.  Musculoskeletal: Negative for other MSK pain or swelling Skin: Negative for color change and worsening wound.  Neurological: Negative for tremors and numbness other than noted  Psychiatric/Behavioral: Negative for decreased concentration or agitation other than above       Objective:   Physical Exam BP 138/84 mmHg  Pulse 94  Temp(Src) 97 F (36.1 C) (Oral)  Resp 20  Wt 260 lb (117.935 kg)  SpO2 97% VS noted, not ill  appaering Constitutional: Pt appears in no significant distress HENT: Head: NCAT.  Right Ear: External ear normal.  Left Ear: External ear normal.  Eyes: . Pupils are equal, round, and reactive to light. Conjunctivae and EOM are normal Neck: Normal range of motion. Neck supple.  Cardiovascular: Normal rate and regular rhythm.   Pulmonary/Chest: Effort normal and breath sounds without rales or wheezing.  Abd:  Soft, NT, ND, + BS Neurological: Pt is alert. Not confused , motor grossly intact Skin: Skin is warm. No rash, no LE edema with LLE casting in place Psychiatric: Pt behavior is normal. No agitation. mild nervous  Lab Results  Component Value Date   WBC 6.2 03/08/2015   HGB 11.1* 03/08/2015   HCT 35* 03/08/2015   PLT 368 03/08/2015   GLUCOSE 113* 03/03/2015   CHOL 119 05/29/2011   TRIG 192.0* 05/29/2011   HDL 36.40* 05/29/2011   LDLDIRECT 137.9 11/28/2010   LDLCALC 44 05/29/2011   ALT 18 03/08/2015   AST 16 03/08/2015   NA 140 03/08/2015   K 4.4 03/08/2015   CL 103 03/03/2015   CREATININE 0.9 03/08/2015   BUN 22* 03/08/2015   CO2 27 03/03/2015   TSH 0.81 05/29/2011   PSA 0.55 05/29/2011   INR 1.05 03/02/2015   HGBA1C 5.6 05/29/2011   Phillip Ohm.  ECHO COMPLETE WO IMAGE ENHANCING AGENT  Order# [id number]   Reading physician: Lewayne Bunting, MD  Ordering physician: Nelda Bucks, MD  Study date: 03/04/15      Study Result    Result status: Final result     *[place]*  *[facility]*  [address]  [place], [place] [address]  [phone]  ------------------------------------------------------------------- Transthoracic Echocardiography  Patient: Phillip, Butler MR #: [id number] Study Date: 03/04/2015 Gender: M Age: 55 Height: 175.3 cm Weight: 120.2 kg BSA: 2.47  m^2 Pt. Status: Room: 5N29C  ADMITTING Marco Collie ATTENDING [place] [id number] SONOGRAPHER Delcie Roch, RDCS, CCT PERFORMING [facility], Inpatient  cc:  ------------------------------------------------------------------- LV EF: 55% - 60%  ------------------------------------------------------------------- Indications: Dyspnea 786.09.  ------------------------------------------------------------------- History: Risk factors: Obstructive sleep apnea.  ------------------------------------------------------------------- Study Conclusions  - Left ventricle: The cavity size was normal. Wall thickness was  normal. Systolic function was normal. The estimated ejection  fraction was in the range of 55% to 60%. Wall motion was normal;  there were no regional wall motion abnormalities. Features are  consistent with a pseudonormal left ventricular filling pattern,  with concomitant abnormal relaxation and increased filling  pressure (grade 2 diastolic dysfunction). - Left atrium: The atrium was mildly dilated.  Impressions:  - Normal LV systolic function; grade 2 diastolic dysfunction; mild  LAE; trace MR and TR.       Assessment & Plan:

## 2015-04-14 DIAGNOSIS — S82852D Displaced trimalleolar fracture of left lower leg, subsequent encounter for closed fracture with routine healing: Secondary | ICD-10-CM | POA: Diagnosis not present

## 2015-04-14 NOTE — Assessment & Plan Note (Signed)
Also has planned f/u with ortho, cont pain control,  to f/u any worsening symptoms or concerns

## 2015-04-14 NOTE — Assessment & Plan Note (Signed)
stable overall by history and exam, verified no HI or SI, and pt to continue medical treatment as before,  to f/u any worsening symptoms or concerns  

## 2015-04-14 NOTE — Assessment & Plan Note (Addendum)
Pt has stopped his xarelto on his own after recent nosebleed, no further bleeding, but will need to restart med such as eliquis that he will accept,  to f/u any worsening symptoms or concerns  Note:  Total time for pt hx, exam, review of record with pt in the room, determination of diagnoses and plan for further eval and tx is > 40 min, with over 50% spent in coordination and counseling of patient

## 2015-04-14 NOTE — Assessment & Plan Note (Signed)
stable overall by history and exam, recent data reviewed with pt, and pt to continue medical treatment as before,  to f/u any worsening symptoms or concerns BP Readings from Last 3 Encounters:  04/12/15 138/84  03/30/15 133/76  03/16/15 111/61

## 2015-04-14 NOTE — Assessment & Plan Note (Signed)
stable overall by history and exam, recent data reviewed with pt, and pt to continue medical treatment as before,  to f/u any worsening symptoms or concerns Lab Results  Component Value Date   WBC 6.2 03/08/2015   HGB 11.1* 03/08/2015   HCT 35* 03/08/2015   PLT 368 03/08/2015   GLUCOSE 113* 03/03/2015   CHOL 119 05/29/2011   TRIG 192.0* 05/29/2011   HDL 36.40* 05/29/2011   LDLDIRECT 137.9 11/28/2010   LDLCALC 44 05/29/2011   ALT 18 03/08/2015   AST 16 03/08/2015   NA 140 03/08/2015   K 4.4 03/08/2015   CL 103 03/03/2015   CREATININE 0.9 03/08/2015   BUN 22* 03/08/2015   CO2 27 03/03/2015   TSH 0.81 05/29/2011   PSA 0.55 05/29/2011   INR 1.05 03/02/2015   HGBA1C 5.6 05/29/2011

## 2015-04-17 ENCOUNTER — Telehealth: Payer: Self-pay

## 2015-04-17 DIAGNOSIS — S82852D Displaced trimalleolar fracture of left lower leg, subsequent encounter for closed fracture with routine healing: Secondary | ICD-10-CM | POA: Diagnosis not present

## 2015-04-17 DIAGNOSIS — I10 Essential (primary) hypertension: Secondary | ICD-10-CM | POA: Diagnosis not present

## 2015-04-17 DIAGNOSIS — F419 Anxiety disorder, unspecified: Secondary | ICD-10-CM | POA: Diagnosis not present

## 2015-04-17 DIAGNOSIS — Z9181 History of falling: Secondary | ICD-10-CM | POA: Diagnosis not present

## 2015-04-17 DIAGNOSIS — F840 Autistic disorder: Secondary | ICD-10-CM | POA: Diagnosis not present

## 2015-04-17 DIAGNOSIS — I5032 Chronic diastolic (congestive) heart failure: Secondary | ICD-10-CM | POA: Diagnosis not present

## 2015-04-17 NOTE — Telephone Encounter (Signed)
Opened encounter to call PT therapist with PCP instructions listed below. No number listed and not able to call.

## 2015-04-17 NOTE — Telephone Encounter (Signed)
Patients physical therapist called and stated that the patients BP was 180/100 today. He stated that normally he will not do any exercises with the patient when the BP is that high, he was wanting to know what you suggested the limits be. Also stated that patient is not taking the Eliqus the way you have prescribed it due to it possibly making him vomit. Physical therapist is wanting to know what should be done if patient is positive for DVT, please advise

## 2015-04-17 NOTE — Telephone Encounter (Signed)
Ok to hold PT for Bp 180/110 or above  Please ask pt to make ROV for BP and DVT

## 2015-04-19 ENCOUNTER — Telehealth: Payer: Self-pay | Admitting: *Deleted

## 2015-04-19 DIAGNOSIS — S82852D Displaced trimalleolar fracture of left lower leg, subsequent encounter for closed fracture with routine healing: Secondary | ICD-10-CM | POA: Diagnosis not present

## 2015-04-19 DIAGNOSIS — F840 Autistic disorder: Secondary | ICD-10-CM | POA: Diagnosis not present

## 2015-04-19 DIAGNOSIS — I5032 Chronic diastolic (congestive) heart failure: Secondary | ICD-10-CM | POA: Diagnosis not present

## 2015-04-19 DIAGNOSIS — F419 Anxiety disorder, unspecified: Secondary | ICD-10-CM | POA: Diagnosis not present

## 2015-04-19 DIAGNOSIS — I10 Essential (primary) hypertension: Secondary | ICD-10-CM | POA: Diagnosis not present

## 2015-04-19 DIAGNOSIS — Z9181 History of falling: Secondary | ICD-10-CM | POA: Diagnosis not present

## 2015-04-19 MED ORDER — LISINOPRIL 20 MG PO TABS: 20.0000 mg | ORAL_TABLET | Freq: Every day | ORAL | Status: DC

## 2015-04-19 MED ORDER — SERTRALINE HCL 100 MG PO TABS: 100.0000 mg | ORAL_TABLET | Freq: Every day | ORAL | Status: DC

## 2015-04-19 MED ORDER — CARVEDILOL 6.25 MG PO TABS: 6.2500 mg | ORAL_TABLET | Freq: Two times a day (BID) | ORAL | Status: DC

## 2015-04-19 MED ORDER — FESOTERODINE FUMARATE ER 4 MG PO TB24: 4.0000 mg | ORAL_TABLET | Freq: Every day | ORAL | Status: DC

## 2015-04-19 MED ORDER — TRAZODONE HCL 50 MG PO TABS: 50.0000 mg | ORAL_TABLET | Freq: Every day | ORAL | Status: DC

## 2015-04-19 NOTE — Telephone Encounter (Signed)
Marchelle FolksAmanda w/[facility] left msg on triage stating pt is need refills on hosp.

## 2015-04-21 ENCOUNTER — Telehealth: Payer: Self-pay

## 2015-04-21 ENCOUNTER — Other Ambulatory Visit: Payer: Self-pay | Admitting: Adult Health

## 2015-04-21 DIAGNOSIS — I10 Essential (primary) hypertension: Secondary | ICD-10-CM | POA: Diagnosis not present

## 2015-04-21 DIAGNOSIS — S82852D Displaced trimalleolar fracture of left lower leg, subsequent encounter for closed fracture with routine healing: Secondary | ICD-10-CM | POA: Diagnosis not present

## 2015-04-21 DIAGNOSIS — F419 Anxiety disorder, unspecified: Secondary | ICD-10-CM | POA: Diagnosis not present

## 2015-04-21 DIAGNOSIS — Z9181 History of falling: Secondary | ICD-10-CM | POA: Diagnosis not present

## 2015-04-21 DIAGNOSIS — I5032 Chronic diastolic (congestive) heart failure: Secondary | ICD-10-CM | POA: Diagnosis not present

## 2015-04-21 DIAGNOSIS — F840 Autistic disorder: Secondary | ICD-10-CM | POA: Diagnosis not present

## 2015-04-21 MED ORDER — LISINOPRIL 20 MG PO TABS: 20.0000 mg | ORAL_TABLET | Freq: Every day | ORAL | Status: DC

## 2015-04-21 NOTE — Telephone Encounter (Signed)
New medications sent in.

## 2015-04-24 DIAGNOSIS — I10 Essential (primary) hypertension: Secondary | ICD-10-CM | POA: Diagnosis not present

## 2015-04-24 DIAGNOSIS — Z9181 History of falling: Secondary | ICD-10-CM | POA: Diagnosis not present

## 2015-04-24 DIAGNOSIS — F419 Anxiety disorder, unspecified: Secondary | ICD-10-CM | POA: Diagnosis not present

## 2015-04-24 DIAGNOSIS — F840 Autistic disorder: Secondary | ICD-10-CM | POA: Diagnosis not present

## 2015-04-24 DIAGNOSIS — S82852D Displaced trimalleolar fracture of left lower leg, subsequent encounter for closed fracture with routine healing: Secondary | ICD-10-CM | POA: Diagnosis not present

## 2015-04-24 DIAGNOSIS — I5032 Chronic diastolic (congestive) heart failure: Secondary | ICD-10-CM | POA: Diagnosis not present

## 2015-04-25 DIAGNOSIS — F840 Autistic disorder: Secondary | ICD-10-CM | POA: Diagnosis not present

## 2015-04-25 DIAGNOSIS — F419 Anxiety disorder, unspecified: Secondary | ICD-10-CM | POA: Diagnosis not present

## 2015-04-25 DIAGNOSIS — S82852D Displaced trimalleolar fracture of left lower leg, subsequent encounter for closed fracture with routine healing: Secondary | ICD-10-CM | POA: Diagnosis not present

## 2015-04-25 DIAGNOSIS — I10 Essential (primary) hypertension: Secondary | ICD-10-CM | POA: Diagnosis not present

## 2015-04-25 DIAGNOSIS — I5032 Chronic diastolic (congestive) heart failure: Secondary | ICD-10-CM | POA: Diagnosis not present

## 2015-04-25 DIAGNOSIS — Z9181 History of falling: Secondary | ICD-10-CM | POA: Diagnosis not present

## 2015-04-26 DIAGNOSIS — I5032 Chronic diastolic (congestive) heart failure: Secondary | ICD-10-CM | POA: Diagnosis not present

## 2015-04-26 DIAGNOSIS — I10 Essential (primary) hypertension: Secondary | ICD-10-CM | POA: Diagnosis not present

## 2015-04-26 DIAGNOSIS — F840 Autistic disorder: Secondary | ICD-10-CM | POA: Diagnosis not present

## 2015-04-26 DIAGNOSIS — Z9181 History of falling: Secondary | ICD-10-CM | POA: Diagnosis not present

## 2015-04-26 DIAGNOSIS — S82852D Displaced trimalleolar fracture of left lower leg, subsequent encounter for closed fracture with routine healing: Secondary | ICD-10-CM | POA: Diagnosis not present

## 2015-04-26 DIAGNOSIS — F419 Anxiety disorder, unspecified: Secondary | ICD-10-CM | POA: Diagnosis not present

## 2015-04-27 DIAGNOSIS — F419 Anxiety disorder, unspecified: Secondary | ICD-10-CM | POA: Diagnosis not present

## 2015-04-27 DIAGNOSIS — Z9181 History of falling: Secondary | ICD-10-CM | POA: Diagnosis not present

## 2015-04-27 DIAGNOSIS — I10 Essential (primary) hypertension: Secondary | ICD-10-CM | POA: Diagnosis not present

## 2015-04-27 DIAGNOSIS — F840 Autistic disorder: Secondary | ICD-10-CM | POA: Diagnosis not present

## 2015-04-27 DIAGNOSIS — S82852D Displaced trimalleolar fracture of left lower leg, subsequent encounter for closed fracture with routine healing: Secondary | ICD-10-CM | POA: Diagnosis not present

## 2015-04-27 DIAGNOSIS — I5032 Chronic diastolic (congestive) heart failure: Secondary | ICD-10-CM | POA: Diagnosis not present

## 2015-04-28 DIAGNOSIS — I10 Essential (primary) hypertension: Secondary | ICD-10-CM | POA: Diagnosis not present

## 2015-04-28 DIAGNOSIS — F840 Autistic disorder: Secondary | ICD-10-CM | POA: Diagnosis not present

## 2015-04-28 DIAGNOSIS — Z9181 History of falling: Secondary | ICD-10-CM | POA: Diagnosis not present

## 2015-04-28 DIAGNOSIS — F419 Anxiety disorder, unspecified: Secondary | ICD-10-CM | POA: Diagnosis not present

## 2015-04-28 DIAGNOSIS — S82852D Displaced trimalleolar fracture of left lower leg, subsequent encounter for closed fracture with routine healing: Secondary | ICD-10-CM | POA: Diagnosis not present

## 2015-04-28 DIAGNOSIS — I5032 Chronic diastolic (congestive) heart failure: Secondary | ICD-10-CM | POA: Diagnosis not present

## 2015-05-01 DIAGNOSIS — I5032 Chronic diastolic (congestive) heart failure: Secondary | ICD-10-CM | POA: Diagnosis not present

## 2015-05-01 DIAGNOSIS — F840 Autistic disorder: Secondary | ICD-10-CM | POA: Diagnosis not present

## 2015-05-01 DIAGNOSIS — I10 Essential (primary) hypertension: Secondary | ICD-10-CM | POA: Diagnosis not present

## 2015-05-01 DIAGNOSIS — S82852D Displaced trimalleolar fracture of left lower leg, subsequent encounter for closed fracture with routine healing: Secondary | ICD-10-CM | POA: Diagnosis not present

## 2015-05-01 DIAGNOSIS — Z9181 History of falling: Secondary | ICD-10-CM | POA: Diagnosis not present

## 2015-05-01 DIAGNOSIS — F419 Anxiety disorder, unspecified: Secondary | ICD-10-CM | POA: Diagnosis not present

## 2015-05-03 DIAGNOSIS — F840 Autistic disorder: Secondary | ICD-10-CM | POA: Diagnosis not present

## 2015-05-03 DIAGNOSIS — Z9181 History of falling: Secondary | ICD-10-CM | POA: Diagnosis not present

## 2015-05-03 DIAGNOSIS — I5032 Chronic diastolic (congestive) heart failure: Secondary | ICD-10-CM | POA: Diagnosis not present

## 2015-05-03 DIAGNOSIS — F419 Anxiety disorder, unspecified: Secondary | ICD-10-CM | POA: Diagnosis not present

## 2015-05-03 DIAGNOSIS — I10 Essential (primary) hypertension: Secondary | ICD-10-CM | POA: Diagnosis not present

## 2015-05-03 DIAGNOSIS — S82852D Displaced trimalleolar fracture of left lower leg, subsequent encounter for closed fracture with routine healing: Secondary | ICD-10-CM | POA: Diagnosis not present

## 2015-05-04 DIAGNOSIS — I10 Essential (primary) hypertension: Secondary | ICD-10-CM | POA: Diagnosis not present

## 2015-05-04 DIAGNOSIS — F840 Autistic disorder: Secondary | ICD-10-CM | POA: Diagnosis not present

## 2015-05-04 DIAGNOSIS — Z9181 History of falling: Secondary | ICD-10-CM | POA: Diagnosis not present

## 2015-05-04 DIAGNOSIS — S82852D Displaced trimalleolar fracture of left lower leg, subsequent encounter for closed fracture with routine healing: Secondary | ICD-10-CM | POA: Diagnosis not present

## 2015-05-04 DIAGNOSIS — F419 Anxiety disorder, unspecified: Secondary | ICD-10-CM | POA: Diagnosis not present

## 2015-05-04 DIAGNOSIS — I5032 Chronic diastolic (congestive) heart failure: Secondary | ICD-10-CM | POA: Diagnosis not present

## 2015-05-05 ENCOUNTER — Other Ambulatory Visit: Payer: Self-pay | Admitting: Internal Medicine

## 2015-05-09 DIAGNOSIS — F840 Autistic disorder: Secondary | ICD-10-CM | POA: Diagnosis not present

## 2015-05-09 DIAGNOSIS — I10 Essential (primary) hypertension: Secondary | ICD-10-CM | POA: Diagnosis not present

## 2015-05-09 DIAGNOSIS — S82852D Displaced trimalleolar fracture of left lower leg, subsequent encounter for closed fracture with routine healing: Secondary | ICD-10-CM | POA: Diagnosis not present

## 2015-05-09 DIAGNOSIS — I5032 Chronic diastolic (congestive) heart failure: Secondary | ICD-10-CM | POA: Diagnosis not present

## 2015-05-09 DIAGNOSIS — Z9181 History of falling: Secondary | ICD-10-CM | POA: Diagnosis not present

## 2015-05-09 DIAGNOSIS — F419 Anxiety disorder, unspecified: Secondary | ICD-10-CM | POA: Diagnosis not present

## 2015-05-10 DIAGNOSIS — S82852D Displaced trimalleolar fracture of left lower leg, subsequent encounter for closed fracture with routine healing: Secondary | ICD-10-CM | POA: Diagnosis not present

## 2015-05-10 DIAGNOSIS — F419 Anxiety disorder, unspecified: Secondary | ICD-10-CM | POA: Diagnosis not present

## 2015-05-10 DIAGNOSIS — Z9181 History of falling: Secondary | ICD-10-CM | POA: Diagnosis not present

## 2015-05-10 DIAGNOSIS — I10 Essential (primary) hypertension: Secondary | ICD-10-CM | POA: Diagnosis not present

## 2015-05-10 DIAGNOSIS — F840 Autistic disorder: Secondary | ICD-10-CM | POA: Diagnosis not present

## 2015-05-10 DIAGNOSIS — I5032 Chronic diastolic (congestive) heart failure: Secondary | ICD-10-CM | POA: Diagnosis not present

## 2015-05-11 DIAGNOSIS — F419 Anxiety disorder, unspecified: Secondary | ICD-10-CM | POA: Diagnosis not present

## 2015-05-11 DIAGNOSIS — I5032 Chronic diastolic (congestive) heart failure: Secondary | ICD-10-CM | POA: Diagnosis not present

## 2015-05-11 DIAGNOSIS — I10 Essential (primary) hypertension: Secondary | ICD-10-CM | POA: Diagnosis not present

## 2015-05-11 DIAGNOSIS — Z9181 History of falling: Secondary | ICD-10-CM | POA: Diagnosis not present

## 2015-05-11 DIAGNOSIS — S82852D Displaced trimalleolar fracture of left lower leg, subsequent encounter for closed fracture with routine healing: Secondary | ICD-10-CM | POA: Diagnosis not present

## 2015-05-11 DIAGNOSIS — F840 Autistic disorder: Secondary | ICD-10-CM | POA: Diagnosis not present

## 2015-05-12 DIAGNOSIS — F419 Anxiety disorder, unspecified: Secondary | ICD-10-CM | POA: Diagnosis not present

## 2015-05-12 DIAGNOSIS — S82852D Displaced trimalleolar fracture of left lower leg, subsequent encounter for closed fracture with routine healing: Secondary | ICD-10-CM | POA: Diagnosis not present

## 2015-05-12 DIAGNOSIS — I5032 Chronic diastolic (congestive) heart failure: Secondary | ICD-10-CM | POA: Diagnosis not present

## 2015-05-12 DIAGNOSIS — I10 Essential (primary) hypertension: Secondary | ICD-10-CM | POA: Diagnosis not present

## 2015-05-12 DIAGNOSIS — Z9181 History of falling: Secondary | ICD-10-CM | POA: Diagnosis not present

## 2015-05-12 DIAGNOSIS — F840 Autistic disorder: Secondary | ICD-10-CM | POA: Diagnosis not present

## 2015-05-16 DIAGNOSIS — I5032 Chronic diastolic (congestive) heart failure: Secondary | ICD-10-CM | POA: Diagnosis not present

## 2015-05-16 DIAGNOSIS — Z9181 History of falling: Secondary | ICD-10-CM | POA: Diagnosis not present

## 2015-05-16 DIAGNOSIS — F419 Anxiety disorder, unspecified: Secondary | ICD-10-CM | POA: Diagnosis not present

## 2015-05-16 DIAGNOSIS — S82852D Displaced trimalleolar fracture of left lower leg, subsequent encounter for closed fracture with routine healing: Secondary | ICD-10-CM | POA: Diagnosis not present

## 2015-05-16 DIAGNOSIS — I10 Essential (primary) hypertension: Secondary | ICD-10-CM | POA: Diagnosis not present

## 2015-05-16 DIAGNOSIS — F840 Autistic disorder: Secondary | ICD-10-CM | POA: Diagnosis not present

## 2015-05-17 DIAGNOSIS — Z9181 History of falling: Secondary | ICD-10-CM | POA: Diagnosis not present

## 2015-05-17 DIAGNOSIS — S82852D Displaced trimalleolar fracture of left lower leg, subsequent encounter for closed fracture with routine healing: Secondary | ICD-10-CM | POA: Diagnosis not present

## 2015-05-17 DIAGNOSIS — I10 Essential (primary) hypertension: Secondary | ICD-10-CM | POA: Diagnosis not present

## 2015-05-17 DIAGNOSIS — I5032 Chronic diastolic (congestive) heart failure: Secondary | ICD-10-CM | POA: Diagnosis not present

## 2015-05-17 DIAGNOSIS — F840 Autistic disorder: Secondary | ICD-10-CM | POA: Diagnosis not present

## 2015-05-17 DIAGNOSIS — F419 Anxiety disorder, unspecified: Secondary | ICD-10-CM | POA: Diagnosis not present

## 2015-05-18 DIAGNOSIS — F419 Anxiety disorder, unspecified: Secondary | ICD-10-CM | POA: Diagnosis not present

## 2015-05-18 DIAGNOSIS — I5032 Chronic diastolic (congestive) heart failure: Secondary | ICD-10-CM | POA: Diagnosis not present

## 2015-05-18 DIAGNOSIS — S82852D Displaced trimalleolar fracture of left lower leg, subsequent encounter for closed fracture with routine healing: Secondary | ICD-10-CM | POA: Diagnosis not present

## 2015-05-18 DIAGNOSIS — I10 Essential (primary) hypertension: Secondary | ICD-10-CM | POA: Diagnosis not present

## 2015-05-18 DIAGNOSIS — Z9181 History of falling: Secondary | ICD-10-CM | POA: Diagnosis not present

## 2015-05-18 DIAGNOSIS — F840 Autistic disorder: Secondary | ICD-10-CM | POA: Diagnosis not present

## 2015-05-22 DIAGNOSIS — S82852D Displaced trimalleolar fracture of left lower leg, subsequent encounter for closed fracture with routine healing: Secondary | ICD-10-CM | POA: Diagnosis not present

## 2015-05-22 DIAGNOSIS — Z9181 History of falling: Secondary | ICD-10-CM | POA: Diagnosis not present

## 2015-05-22 DIAGNOSIS — I5032 Chronic diastolic (congestive) heart failure: Secondary | ICD-10-CM | POA: Diagnosis not present

## 2015-05-22 DIAGNOSIS — F840 Autistic disorder: Secondary | ICD-10-CM | POA: Diagnosis not present

## 2015-05-22 DIAGNOSIS — F419 Anxiety disorder, unspecified: Secondary | ICD-10-CM | POA: Diagnosis not present

## 2015-05-22 DIAGNOSIS — Z7982 Long term (current) use of aspirin: Secondary | ICD-10-CM | POA: Diagnosis not present

## 2015-05-22 DIAGNOSIS — I10 Essential (primary) hypertension: Secondary | ICD-10-CM | POA: Diagnosis not present

## 2015-05-23 DIAGNOSIS — F419 Anxiety disorder, unspecified: Secondary | ICD-10-CM | POA: Diagnosis not present

## 2015-05-23 DIAGNOSIS — S82852D Displaced trimalleolar fracture of left lower leg, subsequent encounter for closed fracture with routine healing: Secondary | ICD-10-CM | POA: Diagnosis not present

## 2015-05-23 DIAGNOSIS — Z9181 History of falling: Secondary | ICD-10-CM | POA: Diagnosis not present

## 2015-05-23 DIAGNOSIS — F840 Autistic disorder: Secondary | ICD-10-CM | POA: Diagnosis not present

## 2015-05-23 DIAGNOSIS — I5032 Chronic diastolic (congestive) heart failure: Secondary | ICD-10-CM | POA: Diagnosis not present

## 2015-05-23 DIAGNOSIS — I10 Essential (primary) hypertension: Secondary | ICD-10-CM | POA: Diagnosis not present

## 2015-05-24 DIAGNOSIS — F840 Autistic disorder: Secondary | ICD-10-CM | POA: Diagnosis not present

## 2015-05-24 DIAGNOSIS — I5032 Chronic diastolic (congestive) heart failure: Secondary | ICD-10-CM | POA: Diagnosis not present

## 2015-05-24 DIAGNOSIS — S82852D Displaced trimalleolar fracture of left lower leg, subsequent encounter for closed fracture with routine healing: Secondary | ICD-10-CM | POA: Diagnosis not present

## 2015-05-24 DIAGNOSIS — F419 Anxiety disorder, unspecified: Secondary | ICD-10-CM | POA: Diagnosis not present

## 2015-05-24 DIAGNOSIS — Z9181 History of falling: Secondary | ICD-10-CM | POA: Diagnosis not present

## 2015-05-24 DIAGNOSIS — I10 Essential (primary) hypertension: Secondary | ICD-10-CM | POA: Diagnosis not present

## 2015-05-25 DIAGNOSIS — S82852D Displaced trimalleolar fracture of left lower leg, subsequent encounter for closed fracture with routine healing: Secondary | ICD-10-CM | POA: Diagnosis not present

## 2015-05-25 DIAGNOSIS — F840 Autistic disorder: Secondary | ICD-10-CM | POA: Diagnosis not present

## 2015-05-25 DIAGNOSIS — I5032 Chronic diastolic (congestive) heart failure: Secondary | ICD-10-CM | POA: Diagnosis not present

## 2015-05-25 DIAGNOSIS — I10 Essential (primary) hypertension: Secondary | ICD-10-CM | POA: Diagnosis not present

## 2015-05-25 DIAGNOSIS — F419 Anxiety disorder, unspecified: Secondary | ICD-10-CM | POA: Diagnosis not present

## 2015-05-25 DIAGNOSIS — Z9181 History of falling: Secondary | ICD-10-CM | POA: Diagnosis not present

## 2015-05-29 DIAGNOSIS — S82852D Displaced trimalleolar fracture of left lower leg, subsequent encounter for closed fracture with routine healing: Secondary | ICD-10-CM | POA: Diagnosis not present

## 2015-05-29 DIAGNOSIS — F419 Anxiety disorder, unspecified: Secondary | ICD-10-CM | POA: Diagnosis not present

## 2015-05-29 DIAGNOSIS — I5032 Chronic diastolic (congestive) heart failure: Secondary | ICD-10-CM | POA: Diagnosis not present

## 2015-05-29 DIAGNOSIS — Z9181 History of falling: Secondary | ICD-10-CM | POA: Diagnosis not present

## 2015-05-29 DIAGNOSIS — I10 Essential (primary) hypertension: Secondary | ICD-10-CM | POA: Diagnosis not present

## 2015-05-29 DIAGNOSIS — F840 Autistic disorder: Secondary | ICD-10-CM | POA: Diagnosis not present

## 2015-05-30 DIAGNOSIS — Z9181 History of falling: Secondary | ICD-10-CM | POA: Diagnosis not present

## 2015-05-30 DIAGNOSIS — F840 Autistic disorder: Secondary | ICD-10-CM | POA: Diagnosis not present

## 2015-05-30 DIAGNOSIS — I5032 Chronic diastolic (congestive) heart failure: Secondary | ICD-10-CM | POA: Diagnosis not present

## 2015-05-30 DIAGNOSIS — I10 Essential (primary) hypertension: Secondary | ICD-10-CM | POA: Diagnosis not present

## 2015-05-30 DIAGNOSIS — F419 Anxiety disorder, unspecified: Secondary | ICD-10-CM | POA: Diagnosis not present

## 2015-05-30 DIAGNOSIS — S82852D Displaced trimalleolar fracture of left lower leg, subsequent encounter for closed fracture with routine healing: Secondary | ICD-10-CM | POA: Diagnosis not present

## 2015-05-31 DIAGNOSIS — Z9181 History of falling: Secondary | ICD-10-CM | POA: Diagnosis not present

## 2015-05-31 DIAGNOSIS — I10 Essential (primary) hypertension: Secondary | ICD-10-CM | POA: Diagnosis not present

## 2015-05-31 DIAGNOSIS — F419 Anxiety disorder, unspecified: Secondary | ICD-10-CM | POA: Diagnosis not present

## 2015-05-31 DIAGNOSIS — I5032 Chronic diastolic (congestive) heart failure: Secondary | ICD-10-CM | POA: Diagnosis not present

## 2015-05-31 DIAGNOSIS — F840 Autistic disorder: Secondary | ICD-10-CM | POA: Diagnosis not present

## 2015-05-31 DIAGNOSIS — S82852D Displaced trimalleolar fracture of left lower leg, subsequent encounter for closed fracture with routine healing: Secondary | ICD-10-CM | POA: Diagnosis not present

## 2015-06-01 DIAGNOSIS — F419 Anxiety disorder, unspecified: Secondary | ICD-10-CM | POA: Diagnosis not present

## 2015-06-01 DIAGNOSIS — F840 Autistic disorder: Secondary | ICD-10-CM | POA: Diagnosis not present

## 2015-06-01 DIAGNOSIS — S82852D Displaced trimalleolar fracture of left lower leg, subsequent encounter for closed fracture with routine healing: Secondary | ICD-10-CM | POA: Diagnosis not present

## 2015-06-01 DIAGNOSIS — Z9181 History of falling: Secondary | ICD-10-CM | POA: Diagnosis not present

## 2015-06-01 DIAGNOSIS — I10 Essential (primary) hypertension: Secondary | ICD-10-CM | POA: Diagnosis not present

## 2015-06-01 DIAGNOSIS — I5032 Chronic diastolic (congestive) heart failure: Secondary | ICD-10-CM | POA: Diagnosis not present

## 2015-06-02 DIAGNOSIS — F840 Autistic disorder: Secondary | ICD-10-CM | POA: Diagnosis not present

## 2015-06-02 DIAGNOSIS — F419 Anxiety disorder, unspecified: Secondary | ICD-10-CM | POA: Diagnosis not present

## 2015-06-02 DIAGNOSIS — I10 Essential (primary) hypertension: Secondary | ICD-10-CM | POA: Diagnosis not present

## 2015-06-02 DIAGNOSIS — I5032 Chronic diastolic (congestive) heart failure: Secondary | ICD-10-CM | POA: Diagnosis not present

## 2015-06-02 DIAGNOSIS — Z9181 History of falling: Secondary | ICD-10-CM | POA: Diagnosis not present

## 2015-06-02 DIAGNOSIS — S82852D Displaced trimalleolar fracture of left lower leg, subsequent encounter for closed fracture with routine healing: Secondary | ICD-10-CM | POA: Diagnosis not present

## 2015-06-05 DIAGNOSIS — S82852D Displaced trimalleolar fracture of left lower leg, subsequent encounter for closed fracture with routine healing: Secondary | ICD-10-CM | POA: Diagnosis not present

## 2015-06-05 DIAGNOSIS — I10 Essential (primary) hypertension: Secondary | ICD-10-CM | POA: Diagnosis not present

## 2015-06-05 DIAGNOSIS — F840 Autistic disorder: Secondary | ICD-10-CM | POA: Diagnosis not present

## 2015-06-05 DIAGNOSIS — F419 Anxiety disorder, unspecified: Secondary | ICD-10-CM | POA: Diagnosis not present

## 2015-06-05 DIAGNOSIS — Z9181 History of falling: Secondary | ICD-10-CM | POA: Diagnosis not present

## 2015-06-05 DIAGNOSIS — I5032 Chronic diastolic (congestive) heart failure: Secondary | ICD-10-CM | POA: Diagnosis not present

## 2015-06-07 DIAGNOSIS — I10 Essential (primary) hypertension: Secondary | ICD-10-CM | POA: Diagnosis not present

## 2015-06-07 DIAGNOSIS — F840 Autistic disorder: Secondary | ICD-10-CM | POA: Diagnosis not present

## 2015-06-07 DIAGNOSIS — S82852D Displaced trimalleolar fracture of left lower leg, subsequent encounter for closed fracture with routine healing: Secondary | ICD-10-CM | POA: Diagnosis not present

## 2015-06-07 DIAGNOSIS — F419 Anxiety disorder, unspecified: Secondary | ICD-10-CM | POA: Diagnosis not present

## 2015-06-07 DIAGNOSIS — I5032 Chronic diastolic (congestive) heart failure: Secondary | ICD-10-CM | POA: Diagnosis not present

## 2015-06-07 DIAGNOSIS — Z9181 History of falling: Secondary | ICD-10-CM | POA: Diagnosis not present

## 2015-06-12 DIAGNOSIS — I5032 Chronic diastolic (congestive) heart failure: Secondary | ICD-10-CM | POA: Diagnosis not present

## 2015-06-12 DIAGNOSIS — S82852D Displaced trimalleolar fracture of left lower leg, subsequent encounter for closed fracture with routine healing: Secondary | ICD-10-CM | POA: Diagnosis not present

## 2015-06-12 DIAGNOSIS — I10 Essential (primary) hypertension: Secondary | ICD-10-CM | POA: Diagnosis not present

## 2015-06-12 DIAGNOSIS — F419 Anxiety disorder, unspecified: Secondary | ICD-10-CM | POA: Diagnosis not present

## 2015-06-12 DIAGNOSIS — Z9181 History of falling: Secondary | ICD-10-CM | POA: Diagnosis not present

## 2015-06-12 DIAGNOSIS — F840 Autistic disorder: Secondary | ICD-10-CM | POA: Diagnosis not present

## 2015-06-14 DIAGNOSIS — S82852D Displaced trimalleolar fracture of left lower leg, subsequent encounter for closed fracture with routine healing: Secondary | ICD-10-CM | POA: Diagnosis not present

## 2015-06-14 DIAGNOSIS — I10 Essential (primary) hypertension: Secondary | ICD-10-CM | POA: Diagnosis not present

## 2015-06-14 DIAGNOSIS — F419 Anxiety disorder, unspecified: Secondary | ICD-10-CM | POA: Diagnosis not present

## 2015-06-14 DIAGNOSIS — Z9181 History of falling: Secondary | ICD-10-CM | POA: Diagnosis not present

## 2015-06-14 DIAGNOSIS — I5032 Chronic diastolic (congestive) heart failure: Secondary | ICD-10-CM | POA: Diagnosis not present

## 2015-06-14 DIAGNOSIS — F840 Autistic disorder: Secondary | ICD-10-CM | POA: Diagnosis not present

## 2015-06-15 DIAGNOSIS — F840 Autistic disorder: Secondary | ICD-10-CM | POA: Diagnosis not present

## 2015-06-15 DIAGNOSIS — S82852D Displaced trimalleolar fracture of left lower leg, subsequent encounter for closed fracture with routine healing: Secondary | ICD-10-CM | POA: Diagnosis not present

## 2015-06-15 DIAGNOSIS — F419 Anxiety disorder, unspecified: Secondary | ICD-10-CM | POA: Diagnosis not present

## 2015-06-15 DIAGNOSIS — Z9181 History of falling: Secondary | ICD-10-CM | POA: Diagnosis not present

## 2015-06-15 DIAGNOSIS — I5032 Chronic diastolic (congestive) heart failure: Secondary | ICD-10-CM | POA: Diagnosis not present

## 2015-06-15 DIAGNOSIS — I10 Essential (primary) hypertension: Secondary | ICD-10-CM | POA: Diagnosis not present

## 2015-06-16 DIAGNOSIS — F419 Anxiety disorder, unspecified: Secondary | ICD-10-CM | POA: Diagnosis not present

## 2015-06-16 DIAGNOSIS — S82852D Displaced trimalleolar fracture of left lower leg, subsequent encounter for closed fracture with routine healing: Secondary | ICD-10-CM | POA: Diagnosis not present

## 2015-06-16 DIAGNOSIS — Z9181 History of falling: Secondary | ICD-10-CM | POA: Diagnosis not present

## 2015-06-16 DIAGNOSIS — I5032 Chronic diastolic (congestive) heart failure: Secondary | ICD-10-CM | POA: Diagnosis not present

## 2015-06-16 DIAGNOSIS — I10 Essential (primary) hypertension: Secondary | ICD-10-CM | POA: Diagnosis not present

## 2015-06-16 DIAGNOSIS — F840 Autistic disorder: Secondary | ICD-10-CM | POA: Diagnosis not present

## 2015-06-20 DIAGNOSIS — I5032 Chronic diastolic (congestive) heart failure: Secondary | ICD-10-CM | POA: Diagnosis not present

## 2015-06-20 DIAGNOSIS — S82852D Displaced trimalleolar fracture of left lower leg, subsequent encounter for closed fracture with routine healing: Secondary | ICD-10-CM | POA: Diagnosis not present

## 2015-06-20 DIAGNOSIS — I10 Essential (primary) hypertension: Secondary | ICD-10-CM | POA: Diagnosis not present

## 2015-06-20 DIAGNOSIS — F419 Anxiety disorder, unspecified: Secondary | ICD-10-CM | POA: Diagnosis not present

## 2015-06-20 DIAGNOSIS — Z9181 History of falling: Secondary | ICD-10-CM | POA: Diagnosis not present

## 2015-06-20 DIAGNOSIS — F840 Autistic disorder: Secondary | ICD-10-CM | POA: Diagnosis not present

## 2015-06-21 DIAGNOSIS — Z9181 History of falling: Secondary | ICD-10-CM | POA: Diagnosis not present

## 2015-06-21 DIAGNOSIS — F840 Autistic disorder: Secondary | ICD-10-CM | POA: Diagnosis not present

## 2015-06-21 DIAGNOSIS — S82852D Displaced trimalleolar fracture of left lower leg, subsequent encounter for closed fracture with routine healing: Secondary | ICD-10-CM | POA: Diagnosis not present

## 2015-06-21 DIAGNOSIS — I5032 Chronic diastolic (congestive) heart failure: Secondary | ICD-10-CM | POA: Diagnosis not present

## 2015-06-21 DIAGNOSIS — F419 Anxiety disorder, unspecified: Secondary | ICD-10-CM | POA: Diagnosis not present

## 2015-06-21 DIAGNOSIS — I10 Essential (primary) hypertension: Secondary | ICD-10-CM | POA: Diagnosis not present

## 2015-06-22 DIAGNOSIS — S82852D Displaced trimalleolar fracture of left lower leg, subsequent encounter for closed fracture with routine healing: Secondary | ICD-10-CM | POA: Diagnosis not present

## 2015-06-22 DIAGNOSIS — I10 Essential (primary) hypertension: Secondary | ICD-10-CM | POA: Diagnosis not present

## 2015-06-22 DIAGNOSIS — I5032 Chronic diastolic (congestive) heart failure: Secondary | ICD-10-CM | POA: Diagnosis not present

## 2015-06-22 DIAGNOSIS — Z9181 History of falling: Secondary | ICD-10-CM | POA: Diagnosis not present

## 2015-06-22 DIAGNOSIS — F419 Anxiety disorder, unspecified: Secondary | ICD-10-CM | POA: Diagnosis not present

## 2015-06-22 DIAGNOSIS — F840 Autistic disorder: Secondary | ICD-10-CM | POA: Diagnosis not present

## 2015-06-27 DIAGNOSIS — I5032 Chronic diastolic (congestive) heart failure: Secondary | ICD-10-CM | POA: Diagnosis not present

## 2015-06-27 DIAGNOSIS — F419 Anxiety disorder, unspecified: Secondary | ICD-10-CM | POA: Diagnosis not present

## 2015-06-27 DIAGNOSIS — F840 Autistic disorder: Secondary | ICD-10-CM | POA: Diagnosis not present

## 2015-06-27 DIAGNOSIS — I10 Essential (primary) hypertension: Secondary | ICD-10-CM | POA: Diagnosis not present

## 2015-06-27 DIAGNOSIS — Z9181 History of falling: Secondary | ICD-10-CM | POA: Diagnosis not present

## 2015-06-27 DIAGNOSIS — S82852D Displaced trimalleolar fracture of left lower leg, subsequent encounter for closed fracture with routine healing: Secondary | ICD-10-CM | POA: Diagnosis not present

## 2015-07-06 DIAGNOSIS — I5032 Chronic diastolic (congestive) heart failure: Secondary | ICD-10-CM | POA: Diagnosis not present

## 2015-07-06 DIAGNOSIS — Z9181 History of falling: Secondary | ICD-10-CM | POA: Diagnosis not present

## 2015-07-06 DIAGNOSIS — F840 Autistic disorder: Secondary | ICD-10-CM | POA: Diagnosis not present

## 2015-07-06 DIAGNOSIS — I10 Essential (primary) hypertension: Secondary | ICD-10-CM | POA: Diagnosis not present

## 2015-07-06 DIAGNOSIS — F419 Anxiety disorder, unspecified: Secondary | ICD-10-CM | POA: Diagnosis not present

## 2015-07-06 DIAGNOSIS — S82852D Displaced trimalleolar fracture of left lower leg, subsequent encounter for closed fracture with routine healing: Secondary | ICD-10-CM | POA: Diagnosis not present

## 2015-07-11 DIAGNOSIS — I10 Essential (primary) hypertension: Secondary | ICD-10-CM | POA: Diagnosis not present

## 2015-07-11 DIAGNOSIS — I5032 Chronic diastolic (congestive) heart failure: Secondary | ICD-10-CM | POA: Diagnosis not present

## 2015-07-11 DIAGNOSIS — S82852D Displaced trimalleolar fracture of left lower leg, subsequent encounter for closed fracture with routine healing: Secondary | ICD-10-CM | POA: Diagnosis not present

## 2015-07-11 DIAGNOSIS — F840 Autistic disorder: Secondary | ICD-10-CM | POA: Diagnosis not present

## 2015-07-11 DIAGNOSIS — F419 Anxiety disorder, unspecified: Secondary | ICD-10-CM | POA: Diagnosis not present

## 2015-07-11 DIAGNOSIS — Z9181 History of falling: Secondary | ICD-10-CM | POA: Diagnosis not present

## 2015-07-14 DIAGNOSIS — S82852D Displaced trimalleolar fracture of left lower leg, subsequent encounter for closed fracture with routine healing: Secondary | ICD-10-CM | POA: Diagnosis not present

## 2015-07-18 DIAGNOSIS — S82852D Displaced trimalleolar fracture of left lower leg, subsequent encounter for closed fracture with routine healing: Secondary | ICD-10-CM | POA: Diagnosis not present

## 2015-07-18 DIAGNOSIS — F840 Autistic disorder: Secondary | ICD-10-CM | POA: Diagnosis not present

## 2015-07-18 DIAGNOSIS — Z9181 History of falling: Secondary | ICD-10-CM | POA: Diagnosis not present

## 2015-07-18 DIAGNOSIS — F419 Anxiety disorder, unspecified: Secondary | ICD-10-CM | POA: Diagnosis not present

## 2015-07-18 DIAGNOSIS — I10 Essential (primary) hypertension: Secondary | ICD-10-CM | POA: Diagnosis not present

## 2015-07-18 DIAGNOSIS — I5032 Chronic diastolic (congestive) heart failure: Secondary | ICD-10-CM | POA: Diagnosis not present

## 2015-07-20 DIAGNOSIS — S82852D Displaced trimalleolar fracture of left lower leg, subsequent encounter for closed fracture with routine healing: Secondary | ICD-10-CM | POA: Diagnosis not present

## 2015-07-26 DIAGNOSIS — S82852D Displaced trimalleolar fracture of left lower leg, subsequent encounter for closed fracture with routine healing: Secondary | ICD-10-CM | POA: Diagnosis not present

## 2015-07-28 DIAGNOSIS — S82852D Displaced trimalleolar fracture of left lower leg, subsequent encounter for closed fracture with routine healing: Secondary | ICD-10-CM | POA: Diagnosis not present

## 2015-08-01 DIAGNOSIS — S82852D Displaced trimalleolar fracture of left lower leg, subsequent encounter for closed fracture with routine healing: Secondary | ICD-10-CM | POA: Diagnosis not present

## 2015-08-03 DIAGNOSIS — S82852D Displaced trimalleolar fracture of left lower leg, subsequent encounter for closed fracture with routine healing: Secondary | ICD-10-CM | POA: Diagnosis not present

## 2015-08-07 DIAGNOSIS — S82852D Displaced trimalleolar fracture of left lower leg, subsequent encounter for closed fracture with routine healing: Secondary | ICD-10-CM | POA: Diagnosis not present

## 2015-08-09 DIAGNOSIS — S82852D Displaced trimalleolar fracture of left lower leg, subsequent encounter for closed fracture with routine healing: Secondary | ICD-10-CM | POA: Diagnosis not present

## 2015-08-11 DIAGNOSIS — S82852D Displaced trimalleolar fracture of left lower leg, subsequent encounter for closed fracture with routine healing: Secondary | ICD-10-CM | POA: Diagnosis not present

## 2015-08-15 DIAGNOSIS — S82852D Displaced trimalleolar fracture of left lower leg, subsequent encounter for closed fracture with routine healing: Secondary | ICD-10-CM | POA: Diagnosis not present

## 2015-08-17 DIAGNOSIS — S82852D Displaced trimalleolar fracture of left lower leg, subsequent encounter for closed fracture with routine healing: Secondary | ICD-10-CM | POA: Diagnosis not present

## 2015-09-02 ENCOUNTER — Other Ambulatory Visit: Payer: Self-pay | Admitting: Internal Medicine

## 2016-01-29 ENCOUNTER — Emergency Department (HOSPITAL_COMMUNITY)
Admission: EM | Admit: 2016-01-29 | Discharge: 2016-01-29 | Disposition: A | Discharge status: Home / Self Care | Payer: Medicare Other | Attending: Emergency Medicine | Admitting: Emergency Medicine

## 2016-01-29 ENCOUNTER — Encounter (HOSPITAL_COMMUNITY): Payer: Self-pay | Admitting: Emergency Medicine

## 2016-01-29 DIAGNOSIS — F840 Autistic disorder: Secondary | ICD-10-CM | POA: Diagnosis not present

## 2016-01-29 DIAGNOSIS — J069 Acute upper respiratory infection, unspecified: Secondary | ICD-10-CM | POA: Diagnosis not present

## 2016-01-29 DIAGNOSIS — I110 Hypertensive heart disease with heart failure: Secondary | ICD-10-CM | POA: Diagnosis not present

## 2016-01-29 DIAGNOSIS — Z79899 Other long term (current) drug therapy: Secondary | ICD-10-CM | POA: Diagnosis not present

## 2016-01-29 DIAGNOSIS — Z7902 Long term (current) use of antithrombotics/antiplatelets: Secondary | ICD-10-CM | POA: Insufficient documentation

## 2016-01-29 DIAGNOSIS — H6123 Impacted cerumen, bilateral: Secondary | ICD-10-CM | POA: Diagnosis not present

## 2016-01-29 DIAGNOSIS — I5032 Chronic diastolic (congestive) heart failure: Secondary | ICD-10-CM | POA: Insufficient documentation

## 2016-01-29 DIAGNOSIS — R0981 Nasal congestion: Secondary | ICD-10-CM | POA: Diagnosis not present

## 2016-01-29 DIAGNOSIS — R05 Cough: Secondary | ICD-10-CM | POA: Diagnosis not present

## 2016-01-29 DIAGNOSIS — B9789 Other viral agents as the cause of diseases classified elsewhere: Secondary | ICD-10-CM

## 2016-01-29 DIAGNOSIS — J029 Acute pharyngitis, unspecified: Secondary | ICD-10-CM | POA: Diagnosis present

## 2016-01-29 LAB — RAPID STREP SCREEN (MED CTR MEBANE ONLY): Streptococcus, Group A Screen (Direct): NEGATIVE

## 2016-01-29 MED ORDER — PREDNISONE 20 MG PO TABS: 40.0000 mg | ORAL_TABLET | Freq: Every day | ORAL | 0 refills | Status: DC

## 2016-01-29 MED ORDER — CARBAMIDE PEROXIDE 6.5 % OT SOLN: 5.0000 [drp] | Freq: Two times a day (BID) | OTIC | 0 refills | Status: DC

## 2016-01-29 MED ORDER — BENZONATATE 100 MG PO CAPS: 200.0000 mg | ORAL_CAPSULE | Freq: Two times a day (BID) | ORAL | 0 refills | Status: DC | PRN

## 2016-01-29 NOTE — ED Notes (Signed)
Call pt for vitals no response.

## 2016-01-29 NOTE — Discharge Instructions (Signed)
You appear to have an upper respiratory infection (URI). An upper respiratory tract infection, or cold, is a viral infection of the air passages leading to the lungs. It is contagious and can be spread to others, especially during the first 3 or 4 days. It cannot be cured by antibiotics or other medicines. °RETURN IMMEDIATELY IF you develop shortness of breath, confusion or altered mental status, a new rash, become dizzy, faint, or poorly responsive, or are unable to be cared for at home. ° °

## 2016-01-29 NOTE — ED Provider Notes (Signed)
[facility]-EMERGENCY DEPT Provider Note   CSN: [id number] Arrival date & time: 01/29/16  1901     History   Chief Complaint Chief Complaint  Patient presents with  . Cough    HPI Fergus Throne. is a 56 y.o. male with a past medical history of autism who presents with cc of URI sxs. Patient c/o sore throat, subjective fevers, nasal congestion, cough for the past 3 days. He took cough drops without relief. He has not tried any other medications. He denies shortness of breath. HPI  Past Medical History:  Diagnosis Date  . Allergic rhinitis   . Anemia of chronic disease   . Anxiety   . Autism   . 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 Depression   . GERD (gastroesophageal reflux disease)   . Hyperlipidemia   . Hypertension   . OSA (obstructive sleep apnea)     Patient Active Problem List   Diagnosis Date Noted  . Respiratory abnormality   . Obesity (BMI 30-39.9) 03/02/2015  . Chronic diastolic heart failure (HCC)   . Autism   . DVT, lower extremity, distal, acute (HCC)   . Closed trimalleolar fracture of left ankle 02/26/2015  . Hyperlipidemia 02/19/2014  . Depression with anxiety 02/19/2014  . Acute respiratory failure with hypoxia (HCC) 02/19/2014  . Normocytic anemia 02/19/2014  . Community acquired pneumonia 02/18/2014  . Hearing loss, bilateral 02/03/2012  . Preventative health care 11/28/2010  . Obstructive sleep apnea 08/14/2009  . Essential hypertension 02/16/2007  . ALLERGIC RHINITIS 02/16/2007  . GERD 02/16/2007    Past Surgical History:  Procedure Laterality Date  . ORIF ANKLE FRACTURE Left 03/02/2015   Procedure: OPEN REDUCTION INTERNAL FIXATION (ORIF) LEFT ANKLE TRIMALLEOLAR FRACTURE;  Surgeon: Toni Arthurs, MD;  Location: [facility] OR;  Service: Orthopedics;  Laterality: Left;       Home Medications    Prior to Admission medications   Medication Sig Start Date End Date Taking? Authorizing Provider  apixaban (ELIQUIS) 5 MG TABS tablet Take 1 tablet (5 mg  total) by mouth 2 (two) times daily. 04/12/15   Corwin Levins, MD  carvedilol (COREG) 6.25 MG tablet Take 1 tablet (6.25 mg total) by mouth 2 (two) times daily with a meal. 04/19/15   Corwin Levins, MD  docusate sodium (COLACE) 100 MG capsule Take 1 capsule (100 mg total) by mouth 2 (two) times daily. While taking narcotic pain medicine. 03/06/15   Jacinta Shoe, PA-C  fesoterodine (TOVIAZ) 4 MG TB24 tablet Take 1 tablet (4 mg total) by mouth daily. 04/19/15   Corwin Levins, MD  guaiFENesin (ROBITUSSIN) 100 MG/5ML liquid Take 100 mg by mouth every 4 (four) hours as needed for cough.    Historical Provider, MD  lisinopril (PRINIVIL,ZESTRIL) 20 MG tablet Take 1 tablet (20 mg total) by mouth daily. 04/21/15   Corwin Levins, MD  omeprazole (PRILOSEC) 20 MG capsule TAKE 1 CAPSULE (20 MG TOTAL) BY MOUTH DAILY. 05/08/15   Corwin Levins, MD  oxyCODONE (ROXICODONE) 5 MG immediate release tablet Take 1-2 tablets (5-10 mg total) by mouth every 4 (four) hours as needed for moderate pain or severe pain. 04/05/15   Corwin Levins, MD  rivaroxaban (XARELTO) 20 MG TABS tablet Take 20 mg by mouth daily with supper. 03/29/15   Historical Provider, MD  sertraline (ZOLOFT) 100 MG tablet Take 1 tablet (100 mg total) by mouth daily. 04/19/15   Corwin Levins, MD  traZODone (DESYREL) 50 MG tablet Take  1 tablet (50 mg total) by mouth at bedtime. Overdue for yearly physical must see Md for refills 09/04/15   Corwin LevinsJames W John, MD    Family History Family History  Problem Relation Age of Onset  . Heart disease Father   . Alcohol abuse    . Arthritis    . Breast cancer    . Prostate cancer    . Hyperlipidemia    . Heart disease    . Stroke    . Kidney disease    . Diabetes    . Pancreatic cancer      Social History Social History  Substance Use Topics  . Smoking status: Never Smoker  . Smokeless tobacco: Never Used  . Alcohol use No     Allergies   Fluoxetine hcl   Review of Systems Review of Systems  Ten systems  reviewed and are negative for acute change, except as noted in the HPI.   Physical Exam Updated Vital Signs BP 131/68   Pulse 104   Temp 98.7 F (37.1 C) (Oral)   Resp 18   Ht 5\' 10"  (1.778 m)   Wt 113.4 kg   SpO2 95%   BMI 35.87 kg/m   Physical Exam  Constitutional: He appears well-developed and well-nourished. No distress.  HENT:  Head: Normocephalic and atraumatic.  Boggy, swollen nasal mucosa Bilateral cerumen impactions  Eyes: Conjunctivae and EOM are normal. Pupils are equal, round, and reactive to light. No scleral icterus.  Neck: Normal range of motion. Neck supple.  Cardiovascular: Normal rate, regular rhythm and normal heart sounds.   Pulmonary/Chest: Effort normal and breath sounds normal. No respiratory distress. He has no wheezes.  Abdominal: Soft. There is no tenderness.  Musculoskeletal: He exhibits no edema.  Neurological: He is alert.  Skin: Skin is warm and dry. He is not diaphoretic.  Psychiatric: His behavior is normal.  Nursing note and vitals reviewed.    ED Treatments / Results  Labs (all labs ordered are listed, but only abnormal results are displayed) Labs Reviewed  RAPID STREP SCREEN (NOT AT [facility])  CULTURE, GROUP A STREP [facility])    EKG  EKG Interpretation None       Radiology No results found.  Procedures Procedures (including critical care time)  Medications Ordered in ED Medications - No data to display   Initial Impression / Assessment and Plan / ED Course  I have reviewed the triage vital signs and the nursing notes.  Pertinent labs & imaging results that were available during my care of the patient were reviewed by me and considered in my medical decision making (see chart for details).  Clinical Course     Pt CXR negative for acute infiltrate. Patients symptoms are consistent with URI, likely viral etiology. Discussed that antibiotics are not indicated for viral infections. Pt will be discharged with symptomatic  treatment.  Verbalizes understanding and is agreeable with plan. Pt is hemodynamically stable & in NAD prior to dc.   Final Clinical Impressions(s) / ED Diagnoses   Final diagnoses:  Viral URI with cough  Head congestion  Bilateral impacted cerumen    New Prescriptions New Prescriptions   No medications on file     Arthor Captainbigail Shalamar Plourde, PA-C 01/31/16 0049    Alvira MondayErin Schlossman, MD 01/31/16 1252

## 2016-01-29 NOTE — ED Triage Notes (Addendum)
Pt reports having congestion, sore throat, and cough for the last week with tan colored sputum.

## 2016-02-01 LAB — CULTURE, GROUP A STREP (THRC)

## 2016-04-10 ENCOUNTER — Ambulatory Visit: Payer: Medicare Other | Admitting: Internal Medicine

## 2016-04-12 ENCOUNTER — Encounter: Payer: Self-pay | Admitting: Internal Medicine

## 2016-04-12 ENCOUNTER — Ambulatory Visit (INDEPENDENT_AMBULATORY_CARE_PROVIDER_SITE_OTHER): Payer: Medicare Other | Admitting: Internal Medicine

## 2016-04-12 VITALS — BP 124/68 | HR 74 | Temp 97.6°F | Ht 70.0 in | Wt 288.0 lb

## 2016-04-12 DIAGNOSIS — I10 Essential (primary) hypertension: Secondary | ICD-10-CM

## 2016-04-12 DIAGNOSIS — M79672 Pain in left foot: Secondary | ICD-10-CM

## 2016-04-12 DIAGNOSIS — J019 Acute sinusitis, unspecified: Secondary | ICD-10-CM | POA: Diagnosis not present

## 2016-04-12 DIAGNOSIS — G2581 Restless legs syndrome: Secondary | ICD-10-CM | POA: Insufficient documentation

## 2016-04-12 DIAGNOSIS — H9193 Unspecified hearing loss, bilateral: Secondary | ICD-10-CM

## 2016-04-12 DIAGNOSIS — F418 Other specified anxiety disorders: Secondary | ICD-10-CM | POA: Diagnosis not present

## 2016-04-12 MED ORDER — SERTRALINE HCL 100 MG PO TABS: 100.0000 mg | ORAL_TABLET | Freq: Every day | ORAL | 3 refills | Status: DC

## 2016-04-12 MED ORDER — AZITHROMYCIN 250 MG PO TABS: ORAL_TABLET | ORAL | 1 refills | Status: DC

## 2016-04-12 MED ORDER — GABAPENTIN 300 MG PO CAPS: 300.0000 mg | ORAL_CAPSULE | Freq: Every day | ORAL | 1 refills | Status: DC

## 2016-04-12 MED ORDER — OMEPRAZOLE 20 MG PO CPDR: DELAYED_RELEASE_CAPSULE | ORAL | 3 refills | Status: DC

## 2016-04-12 MED ORDER — LISINOPRIL 20 MG PO TABS: 20.0000 mg | ORAL_TABLET | Freq: Every day | ORAL | 3 refills | Status: DC

## 2016-04-12 NOTE — Patient Instructions (Signed)
Your ears were irrigated of wax today  Please take all new medication as prescribed - the antibiotic for sinus,  And gabapentin 300 mg at bedtime for the RLS  You will be contacted regarding the referral for: Dr Katrinka BlazingSmith for the left foot  Please continue all other medications as before, and refills have been done if requested.  Please have the pharmacy call with any other refills you may need.  Please keep your appointments with your specialists as you may have planned

## 2016-04-12 NOTE — Progress Notes (Signed)
Pre visit review using our clinic review tool, if applicable. No additional management support is needed unless otherwise documented below in the visit note. 

## 2016-04-12 NOTE — Assessment & Plan Note (Signed)
stable overall by history and exam, recent data reviewed with pt, and pt to continue medical treatment as before,  to f/u any worsening symptoms or concerns BP Readings from Last 3 Encounters:  04/12/16 124/68  01/29/16 149/92  04/12/15 138/84

## 2016-04-12 NOTE — Progress Notes (Signed)
Subjective:    Patient ID: Phillip Ohmharles G Mattioli Jr., male    DOB: 01-Aug-1960, 56 y.o.   MRN: [id number]  HPI  Here to f/u; overall doing ok,  Pt denies chest pain, increasing sob or doe, wheezing, orthopnea, PND, increased LE swelling, palpitations, dizziness or syncope.  Pt denies new neurological symptoms such as new headache, or facial or extremity weakness or numbness.  Pt denies polydipsia, polyuria, or low sugar episode.   Pt denies new neurological symptoms such as new headache, or facial or extremity weakness or numbness.   Pt states overall good compliance with meds, mostly trying to follow appropriate diet, with wt overall stable,  but little exercise however.  Does have bilat hearing reduced in last week  - ? Wax again.  Also,  Here with 2-3 days acute onset fever, facial pain, pressure, headache, general weakness and malaise, and greenish d/c, with mild ST and cough.  Also has recent worsening RLS like symptoms and asks for tx.  Also with left ankle pain persistent for several wks with mild swelling, without trauma or fever, limps to walk, just not getting better  Denies worsening depressive symptoms, suicidal ideation, or panic Past Medical History:  Diagnosis Date  . Allergic rhinitis   . Anemia of chronic disease   . Anxiety   . Autism   . 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 Depression   . GERD (gastroesophageal reflux disease)   . Hyperlipidemia   . Hypertension   . OSA (obstructive sleep apnea)    Past Surgical History:  Procedure Laterality Date  . ORIF ANKLE FRACTURE Left 03/02/2015   Procedure: OPEN REDUCTION INTERNAL FIXATION (ORIF) LEFT ANKLE TRIMALLEOLAR FRACTURE;  Surgeon: Toni ArthursJohn Hewitt, MD;  Location: [facility] OR;  Service: Orthopedics;  Laterality: Left;    reports that he has never smoked. He has never used smokeless tobacco. He reports that he does not drink alcohol or use drugs. family history includes Heart disease in his father. Allergies  Allergen Reactions  .  Fluoxetine Hcl     REACTION: ineffective   Current Outpatient Prescriptions on File Prior to Visit  Medication Sig Dispense Refill  . apixaban (ELIQUIS) 5 MG TABS tablet Take 1 tablet (5 mg total) by mouth 2 (two) times daily. 60 tablet 5  . carbamide peroxide (DEBROX) 6.5 % otic solution Place 5 drops into both ears 2 (two) times daily. For 4 days 15 mL 0  . carvedilol (COREG) 6.25 MG tablet Take 1 tablet (6.25 mg total) by mouth 2 (two) times daily with a meal. 60 tablet 5  . docusate sodium (COLACE) 100 MG capsule Take 1 capsule (100 mg total) by mouth 2 (two) times daily. While taking narcotic pain medicine. 30 capsule 0  . fesoterodine (TOVIAZ) 4 MG TB24 tablet Take 1 tablet (4 mg total) by mouth daily. 90 tablet 0  . guaiFENesin (ROBITUSSIN) 100 MG/5ML liquid Take 100 mg by mouth every 4 (four) hours as needed for cough.    Marland Kitchen. oxyCODONE (ROXICODONE) 5 MG immediate release tablet Take 1-2 tablets (5-10 mg total) by mouth every 4 (four) hours as needed for moderate pain or severe pain. 30 tablet 0  . rivaroxaban (XARELTO) 20 MG TABS tablet Take 20 mg by mouth daily with supper.    . traZODone (DESYREL) 50 MG tablet Take 1 tablet (50 mg total) by mouth at bedtime. Overdue for yearly physical must see Md for refills 30 tablet 0   No current facility-administered medications on file prior  to visit.    Review of Systems  Constitutional: Negative for unusual diaphoresis or night sweats HENT: Negative for ear swelling or discharge Eyes: Negative for worsening visual haziness  Respiratory: Negative for choking and stridor.   Gastrointestinal: Negative for distension or worsening eructation Genitourinary: Negative for retention or change in urine volume.  Musculoskeletal: Negative for other MSK pain or swelling Skin: Negative for color change and worsening wound Neurological: Negative for tremors and numbness other than noted  Psychiatric/Behavioral: Negative for decreased concentration or  agitation other than above   All other system neg per pt    Objective:   Physical Exam BP 124/68   Pulse 74   Temp 97.6 F (36.4 C) (Oral)   Ht 5\' 10"  (1.778 m)   Wt 288 lb (130.6 kg)   SpO2 98%   BMI 41.32 kg/m  VS noted, mild ill Constitutional: Pt appears in no apparent distress HENT: Head: NCAT.  Right Ear: External ear normal.  Left Ear: External ear normal.  Eyes: . Pupils are equal, round, and reactive to light. Conjunctivae and EOM are normal bilat canals cleared of wax impactions with hearing improved Bilat tm's with mild erythema.  Max sinus areas mild tender.  Pharynx with mild erythema, no exudate Neck: Normal range of motion. Neck supple.  Cardiovascular: Normal rate and regular rhythm.   Pulmonary/Chest: Effort normal and breath sounds without rales or wheezing.  Neurological: Pt is alert. Not confused , motor grossly intact Skin: Skin is warm. No rash, no LE edema Psychiatric: Pt behavior is normal. No agitation. not depressed affect Left foot and ankle with vague swelling to medial left tarsal tunnel area and tender No other exam findings    Assessment & Plan:

## 2016-04-12 NOTE — Assessment & Plan Note (Signed)
Ok for gabapentin 300 qhs,  to f/u any worsening symptoms or concerns 

## 2016-04-12 NOTE — Assessment & Plan Note (Signed)
Mild to mod, for antibx course,  to f/u any worsening symptoms or concerns 

## 2016-04-12 NOTE — Assessment & Plan Note (Signed)
stable overall by history and exam, and pt to continue medical treatment as before,  to f/u any worsening symptoms or concerns 

## 2016-04-12 NOTE — Assessment & Plan Note (Signed)
Improved with irrigation wax impactions,  to f/u any worsening symptoms or concerns 

## 2016-04-12 NOTE — Assessment & Plan Note (Signed)
?   Tarsal tunnel like illness, to refer Dr Katrinka BlazingSmith, sport medicine, pain control

## 2016-04-21 ENCOUNTER — Other Ambulatory Visit: Payer: Self-pay | Admitting: Internal Medicine

## 2016-05-22 ENCOUNTER — Other Ambulatory Visit: Payer: Self-pay | Admitting: Internal Medicine

## 2016-06-13 ENCOUNTER — Encounter: Payer: Self-pay | Admitting: *Deleted

## 2016-06-13 ENCOUNTER — Ambulatory Visit (INDEPENDENT_AMBULATORY_CARE_PROVIDER_SITE_OTHER)
Admission: RE | Admit: 2016-06-13 | Discharge: 2016-06-13 | Disposition: A | Discharge status: Home / Self Care | Payer: Medicare Other | Source: Ambulatory Visit | Attending: Family Medicine | Admitting: Family Medicine

## 2016-06-13 ENCOUNTER — Ambulatory Visit: Payer: Self-pay

## 2016-06-13 ENCOUNTER — Ambulatory Visit (INDEPENDENT_AMBULATORY_CARE_PROVIDER_SITE_OTHER): Payer: Medicare Other | Admitting: Family Medicine

## 2016-06-13 VITALS — BP 148/90 | Ht 70.0 in | Wt 290.0 lb

## 2016-06-13 DIAGNOSIS — G8929 Other chronic pain: Secondary | ICD-10-CM

## 2016-06-13 DIAGNOSIS — M79672 Pain in left foot: Secondary | ICD-10-CM

## 2016-06-13 DIAGNOSIS — M7989 Other specified soft tissue disorders: Secondary | ICD-10-CM | POA: Diagnosis not present

## 2016-06-13 DIAGNOSIS — M25572 Pain in left ankle and joints of left foot: Secondary | ICD-10-CM | POA: Diagnosis not present

## 2016-06-13 DIAGNOSIS — S82852S Displaced trimalleolar fracture of left lower leg, sequela: Secondary | ICD-10-CM

## 2016-06-13 MED ORDER — VITAMIN D (ERGOCALCIFEROL) 1.25 MG (50000 UNIT) PO CAPS: 50000.0000 [IU] | ORAL_CAPSULE | ORAL | 0 refills | Status: DC

## 2016-06-13 NOTE — Assessment & Plan Note (Signed)
Patient does have left ankle pain. There is concerned than patient does have potential loosening from patient's previous surgery one year ago. Patient will have x-rays to make sure nothing is going on. We discussed icing regimen and home exercises. We discussed which activities to do as well as put in an Aircast.

## 2016-06-13 NOTE — Progress Notes (Signed)
[name] [address] [place], [place] [address] Phone: [phone] Subjective:    I'm seeing this patient by the request  of:  Corwin Levins, MD   CC: Left foot pain  [id number]  Phillip Butler. is a 56 y.o. male coming in with complaint of left foot pain. Patient states over a year ago patient did have a broken ankle that need surgical repair. Patient states The area for the surgery was is starting to hurt. States that his been hurting more for the last couple months. Patient does not rib number any true injury. This is causing patient had more left foot pain as well. Patient states that he likes to walk barefoot and notices that this causes more pain. States that this is associated with swelling but has had bilaterally. Patient's is a difficult historian.  States that the right leg hurts as well. Thinks it is secondary to maybe other leg. Wants to make sure nothing severely bad. History of a deep venous thrombosis but patient is on  blood thinner .     Past Medical History:  Diagnosis Date  . Allergic rhinitis   . Anemia of chronic disease   . Anxiety   . Autism   . 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 Depression   . GERD (gastroesophageal reflux disease)   . Hyperlipidemia   . Hypertension   . OSA (obstructive sleep apnea)    Past Surgical History:  Procedure Laterality Date  . ORIF ANKLE FRACTURE Left 03/02/2015   Procedure: OPEN REDUCTION INTERNAL FIXATION (ORIF) LEFT ANKLE TRIMALLEOLAR FRACTURE;  Surgeon: Toni Arthurs, MD;  Location: [facility] OR;  Service: Orthopedics;  Laterality: Left;   Social History   Social History  . Marital status: Single    Spouse name: N/A  . Number of children: N/A  . Years of education: N/A   Social History Main Topics  . Smoking status: Never Smoker  . Smokeless tobacco: Never Used  . Alcohol use No  . Drug use: No  . Sexual activity: Not Asked   Other Topics Concern  . None   Social History  Narrative  . None   Allergies  Allergen Reactions  . Fluoxetine Hcl     REACTION: ineffective   Family History  Problem Relation Age of Onset  . Heart disease Father   . Alcohol abuse Unknown   . Arthritis Unknown   . Breast cancer Unknown   . Prostate cancer Unknown   . Hyperlipidemia Unknown   . Heart disease Unknown   . Stroke Unknown   . Kidney disease Unknown   . Diabetes Unknown   . Pancreatic cancer Unknown     Past medical history, social, surgical and family history all reviewed in electronic medical record.  No pertanent information unless stated regarding to the chief complaint.   Review of Systems:Review of systems updated and as accurate as of 06/13/16  No visual changes, nausea, vomiting, diarrhea, constipation, dizziness, abdominal pain, skin rash, fevers, chills, night sweats, weight loss, swollen lymph nodes,  chest pain, shortness of breath, mood changes.  Positive lower leg swelling, muscle aches, headaches, joint swelling, body aches  Objective  Blood pressure (!) 148/90, height 5\' 10"  (1.778 m), weight 290 lb (131.5 kg). Systems examined below as of 06/13/16   General: No apparent distress alert and oriented x3 mood and affect normal, Disheveled HEENT: Pupils equal, extraocular movements intact  Respiratory: Patient's speak in full sentences and does not  appear short of breath  Cardiovascular: 2+ lower extremity edema, non tender, no erythema  Skin: Warm dry intact with no signs of infection or rash on extremities or on axial skeleton.  Abdomen: Soft nontender  Neuro: Cranial nerves II through XII are intact, neurovascularly intact in all extremities with 2+ DTRs and 2+ pulses.  Lymph: No lymphadenopathy of posterior or anterior cervical chain or axillae bilaterally.  Gait normal with good balance and coordination.  MSK:  Non tender with full range of motion and good stability and symmetric strength and tone of shoulders, elbows, wrist, hip, knee  bilaterally.  Ankle: Left Lower extremity edema noted. Limited range of motion lacking the last 10 at least in all planes. 4 out of 5 strength compared to the contralateral side. Severe tenderness over the medial malleolus. Talar dome mild tenderness No pain at base of 5th MT; No tenderness over cuboid; No tenderness over N spot or navicular prominence No sign of peroneal tendon subluxations or tenderness to palpation Severe tenderness and swelling over the posterior tibialis tendon going into the insertion on the plantar aspect of the foot Negative tarsal tunnel tinel's Able to walk 4 steps.  Significant pes planus noted.  MSK US performed of: Left This study was ordered, performed, and interpreted by Terrilee FilesZach Kintz D.O.  Foot/Ankle:   Significant calcific changes surrounding patient's plate of over the medial malleolus. Increasing Doppler flow noted and the most inferior aspect of the plate at the medial malleolus. Patient's posterior tibialis tendon sheath has hypoechoic changes. No true tear appreciated.  IMPRESSION:  Calcific changes and postoperative changes with increasing Doppler flow as well as posterior tibialis tendinitis.  Procedure note 97110; 15 minutes spent for Therapeutic exercises as stated in above notes.  This included exercises focusing on stretching, strengthening, with significant focus on eccentric aspects. Ankle strengthening that included:  Basic range of motion exercises to allow proper full motion at ankle Stretching of the lower leg and hamstrings  Theraband exercises for the lower leg - inversion, eversion, dorsiflexion and plantarflexion each to be completed with a theraband Balance exercises to increase proprioception Weight bearing exercises to increase strength and balance   Proper technique shown and discussed handout in great detail with ATC.  All questions were discussed and answered.       Impression and Recommendations:     This case required  medical decision making of moderate complexity.      Note: This dictation was prepared with Dragon dictation along with smaller phrase technology. Any transcriptional errors that result from this process are unintentional.

## 2016-06-13 NOTE — Patient Instructions (Signed)
Good to see you  What is wrong is a tendon in the ankle is very irritated  Ice 20 minutes 2 times daily. Usually after activity and before bed. Wear the brace daily for the next 2 weeks at least or as along as it feels good.  Exercises 3 times a week.  pennsaid pinkie amount topically 2 times daily as needed.  Xrays downstairs to make sure the plate is ok.  I will call yo uif bad Vitamin D once weekly until gone.  See me again in 4 weeks.

## 2016-06-18 ENCOUNTER — Ambulatory Visit: Payer: Medicare Other | Admitting: Internal Medicine

## 2016-06-20 ENCOUNTER — Other Ambulatory Visit (INDEPENDENT_AMBULATORY_CARE_PROVIDER_SITE_OTHER): Payer: Medicare Other

## 2016-06-20 ENCOUNTER — Encounter: Payer: Self-pay | Admitting: Internal Medicine

## 2016-06-20 ENCOUNTER — Ambulatory Visit (INDEPENDENT_AMBULATORY_CARE_PROVIDER_SITE_OTHER): Payer: Medicare Other | Admitting: Internal Medicine

## 2016-06-20 VITALS — BP 136/84 | HR 96 | Ht 70.0 in | Wt 284.0 lb

## 2016-06-20 DIAGNOSIS — N320 Bladder-neck obstruction: Secondary | ICD-10-CM

## 2016-06-20 DIAGNOSIS — I10 Essential (primary) hypertension: Secondary | ICD-10-CM | POA: Diagnosis not present

## 2016-06-20 DIAGNOSIS — I5032 Chronic diastolic (congestive) heart failure: Secondary | ICD-10-CM

## 2016-06-20 DIAGNOSIS — E785 Hyperlipidemia, unspecified: Secondary | ICD-10-CM

## 2016-06-20 DIAGNOSIS — Z1159 Encounter for screening for other viral diseases: Secondary | ICD-10-CM

## 2016-06-20 DIAGNOSIS — M79604 Pain in right leg: Secondary | ICD-10-CM

## 2016-06-20 LAB — URINALYSIS, ROUTINE W REFLEX MICROSCOPIC
BILIRUBIN URINE: NEGATIVE
KETONES UR: NEGATIVE
LEUKOCYTES UA: NEGATIVE
NITRITE: NEGATIVE
Specific Gravity, Urine: >=1.03 — AB (ref 1.000–1.030)
Total Protein, Urine: 30 — AB
Urine Glucose: NEGATIVE
Urobilinogen, UA: 2 — AB (ref 0.0–1.0)
pH: 6 (ref 5.0–8.0)

## 2016-06-20 LAB — LIPID PANEL
CHOL/HDL RATIO: 5
CHOLESTEROL: 187 mg/dL (ref 0–200)
HDL: 40 mg/dL (ref >39.00)
NonHDL: 146.88
TRIGLYCERIDES: 203 mg/dL — AB (ref 0.0–149.0)
VLDL: 40.6 mg/dL — AB (ref 0.0–40.0)

## 2016-06-20 LAB — CBC WITH DIFFERENTIAL/PLATELET
Basophils Absolute: 0 10*3/uL (ref 0.0–0.1)
Basophils Relative: 0.6 % (ref 0.0–3.0)
EOS PCT: 3.8 % (ref 0.0–5.0)
Eosinophils Absolute: 0.2 10*3/uL (ref 0.0–0.7)
HEMATOCRIT: 36.4 % — AB (ref 39.0–52.0)
HEMOGLOBIN: 12.4 g/dL — AB (ref 13.0–17.0)
LYMPHS PCT: 25 % (ref 12.0–46.0)
Lymphs Abs: 1.5 10*3/uL (ref 0.7–4.0)
MCHC: 34 g/dL (ref 30.0–36.0)
MCV: 86.7 fl (ref 78.0–100.0)
MONO ABS: 0.6 10*3/uL (ref 0.1–1.0)
Monocytes Relative: 9.3 % (ref 3.0–12.0)
Neutro Abs: 3.7 10*3/uL (ref 1.4–7.7)
Neutrophils Relative %: 61.3 % (ref 43.0–77.0)
Platelets: 316 10*3/uL (ref 150.0–400.0)
RBC: 4.2 Mil/uL — AB (ref 4.22–5.81)
RDW: 14.5 % (ref 11.5–15.5)
WBC: 6 10*3/uL (ref 4.0–10.5)

## 2016-06-20 LAB — TSH: TSH: 0.85 u[IU]/mL (ref 0.35–4.50)

## 2016-06-20 LAB — HEPATITIS C ANTIBODY: HCV Ab: NEGATIVE

## 2016-06-20 LAB — HEPATIC FUNCTION PANEL
ALT: 16 U/L (ref 0–53)
AST: 20 U/L (ref 0–37)
Albumin: 4.4 g/dL (ref 3.5–5.2)
Alkaline Phosphatase: 120 U/L — ABNORMAL HIGH (ref 39–117)
Bilirubin, Direct: 0.1 mg/dL (ref 0.0–0.3)
Total Bilirubin: 0.4 mg/dL (ref 0.2–1.2)
Total Protein: 7.8 g/dL (ref 6.0–8.3)

## 2016-06-20 LAB — BASIC METABOLIC PANEL
BUN: 18 mg/dL (ref 6–23)
CALCIUM: 9.3 mg/dL (ref 8.4–10.5)
CO2: 29 mEq/L (ref 19–32)
CREATININE: 1.18 mg/dL (ref 0.40–1.50)
Chloride: 107 mEq/L (ref 96–112)
GFR: 67.76 mL/min (ref >60.00)
GLUCOSE: 107 mg/dL — AB (ref 70–99)
Potassium: 4 mEq/L (ref 3.5–5.1)
Sodium: 141 mEq/L (ref 135–145)

## 2016-06-20 LAB — PSA: PSA: 0.4 ng/mL (ref 0.10–4.00)

## 2016-06-20 LAB — LDL CHOLESTEROL, DIRECT: Direct LDL: 109 mg/dL

## 2016-06-20 MED ORDER — DOXYCYCLINE HYCLATE 100 MG PO TABS: 100.0000 mg | ORAL_TABLET | Freq: Two times a day (BID) | ORAL | 0 refills | Status: DC

## 2016-06-20 MED ORDER — FUROSEMIDE 20 MG PO TABS: 20.0000 mg | ORAL_TABLET | Freq: Every day | ORAL | 11 refills | Status: DC

## 2016-06-20 MED ORDER — APIXABAN 5 MG PO TABS: 5.0000 mg | ORAL_TABLET | Freq: Two times a day (BID) | ORAL | 11 refills | Status: DC

## 2016-06-20 MED ORDER — TRAMADOL HCL 50 MG PO TABS: 50.0000 mg | ORAL_TABLET | Freq: Three times a day (TID) | ORAL | 0 refills | Status: DC | PRN

## 2016-06-20 NOTE — Progress Notes (Signed)
Subjective:    Patient ID: Phillip Ohm., male    DOB: 19-Jan-1961, 56 y.o.   MRN: [id number]  HPI  Pt is difficult historian, c/o right medial leg pain just at the medial hamstring insertion site, also with 6 x 8 mild erythem area tender and somewhat indurated.  He is not sure of how long this has gone on, states mild to mod constant, worse to walk, better to sit.  Not sure about fever.  Has been trying to be more active and lose wt.  Has bilat LE edema but he is not sure how long this may have been worse and Pt denies chest pain, increased sob or doe, wheezing, orthopnea, PND palpitations, dizziness or syncope. No recent falls. Pt denies new neurological symptoms such as new headache, or facial or extremity weakness or numbness   Pt denies polydipsia, polyuria Denies other calf pain Wt Readings from Last 3 Encounters:  06/20/16 284 lb (128.8 kg)  06/13/16 290 lb (131.5 kg)  04/12/16 288 lb (130.6 kg)  Though he is quite vague, he requests change of the xarelto back to eliquis, as xarelto made him "sick>" Past Medical History:  Diagnosis Date  . Allergic rhinitis   . Anemia of chronic disease   . Anxiety   . Autism   . 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 Depression   . GERD (gastroesophageal reflux disease)   . Hyperlipidemia   . Hypertension   . OSA (obstructive sleep apnea)    Past Surgical History:  Procedure Laterality Date  . ORIF ANKLE FRACTURE Left 03/02/2015   Procedure: OPEN REDUCTION INTERNAL FIXATION (ORIF) LEFT ANKLE TRIMALLEOLAR FRACTURE;  Surgeon: Toni Arthurs, MD;  Location: [facility] OR;  Service: Orthopedics;  Laterality: Left;    reports that he has never smoked. He has never used smokeless tobacco. He reports that he does not drink alcohol or use drugs. family history includes Heart disease in his father. Allergies  Allergen Reactions  . Fluoxetine Hcl     REACTION: ineffective   Current Outpatient Prescriptions on File Prior to Visit  Medication Sig Dispense  Refill  . apixaban (ELIQUIS) 5 MG TABS tablet Take 1 tablet (5 mg total) by mouth 2 (two) times daily. 60 tablet 5  . azithromycin (ZITHROMAX Z-PAK) 250 MG tablet 2 tab by mouth day 1, then 1 per day 6 tablet 1  . carvedilol (COREG) 6.25 MG tablet Take 1 tablet (6.25 mg total) by mouth 2 (two) times daily with a meal. 60 tablet 5  . docusate sodium (COLACE) 100 MG capsule Take 1 capsule (100 mg total) by mouth 2 (two) times daily. While taking narcotic pain medicine. 30 capsule 0  . fesoterodine (TOVIAZ) 4 MG TB24 tablet Take 1 tablet (4 mg total) by mouth daily. 90 tablet 0  . gabapentin (NEURONTIN) 300 MG capsule Take 1 capsule (300 mg total) by mouth at bedtime. 90 capsule 1  . lisinopril (PRINIVIL,ZESTRIL) 20 MG tablet Take 1 tablet (20 mg total) by mouth daily. 90 tablet 3  . omeprazole (PRILOSEC) 20 MG capsule TAKE 1 CAPSULE (20 MG TOTAL) BY MOUTH DAILY. 90 capsule 3  . oxyCODONE (ROXICODONE) 5 MG immediate release tablet Take 1-2 tablets (5-10 mg total) by mouth every 4 (four) hours as needed for moderate pain or severe pain. 30 tablet 0  . rivaroxaban (XARELTO) 20 MG TABS tablet Take 20 mg by mouth daily with supper.    . sertraline (ZOLOFT) 100 MG tablet Take 1 tablet (100  mg total) by mouth daily. 90 tablet 3  . sertraline (ZOLOFT) 100 MG tablet TAKE 1 TABLET BY MOUTH EVERY DAY 90 tablet 3  . traZODone (DESYREL) 50 MG tablet Take 1 tablet (50 mg total) by mouth at bedtime. Overdue for yearly physical must see Md for refills 30 tablet 0  . Vitamin D, Ergocalciferol, (DRISDOL) 50000 units CAPS capsule Take 1 capsule (50,000 Units total) by mouth every 7 (seven) days. 8 capsule 0   No current facility-administered medications on file prior to visit.     Review of Systems  Constitutional: Negative for other unusual diaphoresis or sweats HENT: Negative for ear discharge or swelling Eyes: Negative for other worsening visual disturbances Respiratory: Negative for stridor or other swelling    Gastrointestinal: Negative for worsening distension or other blood Genitourinary: Negative for retention or other urinary change Musculoskeletal: Negative for other MSK pain or swelling Skin: Negative for color change or other new lesions Neurological: Negative for worsening tremors and other numbness  Psychiatric/Behavioral: Negative for worsening agitation or other fatigue All other system neg per pt    Objective:   Physical Exam BP 136/84   Pulse 96   Ht 5\' 10"  (1.778 m)   Wt 284 lb (128.8 kg)   SpO2 97%   BMI 40.75 kg/m  VS noted, autistic with ability for limited hx, non toxic, fatigued appearing Constitutional: Pt appears in NAD HENT: Head: NCAT.  Right Ear: External ear normal.  Left Ear: External ear normal.  Eyes: . Pupils are equal, round, and reactive to light. Conjunctivae and EOM are normal Nose: without d/c or deformity Neck: Neck supple. Gross normal ROM Cardiovascular: Normal rate and regular rhythm.   Pulmonary/Chest: Effort normal and breath sounds without rales or wheezing.  Abd:  Soft, NT, ND, + BS, no organomegaly Neurological: Pt is alert. At baseline orientation, motor grossly intact Skin: Skin is warm. No rashes, other new lesions, has bilat 1+ bilat LE edema to knees, but also 6 x 8 cm area right medial leg medial hamstring insertion site erythema, tender, swelling with slight induration to palpation Psychiatric: Pt behavior is normal without agitation  No other exam findings Lab Results  Component Value Date   WBC 6.2 03/08/2015   HGB 11.1 (A) 03/08/2015   HCT 35 (A) 03/08/2015   PLT 368 03/08/2015   GLUCOSE 113 (H) 03/03/2015   CHOL 119 05/29/2011   TRIG 192.0 (H) 05/29/2011   HDL 36.40 (L) 05/29/2011   LDLDIRECT 137.9 11/28/2010   LDLCALC 44 05/29/2011   ALT 18 03/08/2015   AST 16 03/08/2015   NA 140 03/08/2015   K 4.4 03/08/2015   CL 103 03/03/2015   CREATININE 0.9 03/08/2015   BUN 22 (A) 03/08/2015   CO2 27 03/03/2015   TSH 0.81  05/29/2011   PSA 0.55 05/29/2011   INR 1.05 03/02/2015   HGBA1C 5.6 05/29/2011       Assessment & Plan:

## 2016-06-20 NOTE — Assessment & Plan Note (Signed)
With increased walking recently, c/w tendonitis vs cellulitis, for pain control, empiric doxy course, hold on walking for 5 days,  to f/u any worsening symptoms or concerns

## 2016-06-20 NOTE — Assessment & Plan Note (Signed)
With worsening LE edema likely related, for start lasix 20 qd, daily wts, f/u 3 wks

## 2016-06-20 NOTE — Assessment & Plan Note (Signed)
stable overall by history and exam, recent data reviewed with pt, and pt to continue medical treatment as before,  to f/u any worsening symptoms or concerns Lab Results  Component Value Date   LDLCALC 44 05/29/2011   For f/u lab today

## 2016-06-20 NOTE — Assessment & Plan Note (Signed)
stable overall by history and exam, recent data reviewed with pt, and pt to continue medical treatment as before,  to f/u any worsening symptoms or concerns BP Readings from Last 3 Encounters:  06/20/16 136/84  06/13/16 (!) 148/90  04/12/16 124/68

## 2016-06-20 NOTE — Patient Instructions (Addendum)
Please take all new medication as prescribed - the antibiotic, and pain medication  Please continue all other medications as before, including taking the eliquis as you requested  Please have the pharmacy call with any other refills you may need.  Please continue your efforts at being more active, low cholesterol diet, and weight control.  You are otherwise up to date with prevention measures today.  Please keep your appointments with your specialists as you may have planned  Please go to the LAB in the Basement (turn left off the elevator) for the tests to be done today  You will be contacted by phone if any changes need to be made immediately.  Otherwise, you will receive a letter about your results with an explanation, but please check with MyChart first.  Please remember to sign up for MyChart if you have not done so, as this will be important to you in the future with finding out test results, communicating by private email, and scheduling acute appointments online when needed.  Please return in 3 WEEKS or sooner if needed

## 2016-06-21 ENCOUNTER — Encounter: Payer: Self-pay | Admitting: Internal Medicine

## 2016-07-11 ENCOUNTER — Ambulatory Visit: Payer: Medicare Other | Admitting: Internal Medicine

## 2016-07-11 ENCOUNTER — Encounter: Payer: Self-pay | Admitting: Family Medicine

## 2016-07-11 ENCOUNTER — Ambulatory Visit (INDEPENDENT_AMBULATORY_CARE_PROVIDER_SITE_OTHER): Payer: Medicare Other | Admitting: Family Medicine

## 2016-07-11 DIAGNOSIS — M19172 Post-traumatic osteoarthritis, left ankle and foot: Secondary | ICD-10-CM | POA: Diagnosis not present

## 2016-07-11 MED ORDER — HYDROCORTISONE 1 % EX LOTN: 1.0000 "application " | TOPICAL_LOTION | Freq: Two times a day (BID) | CUTANEOUS | 0 refills | Status: DC

## 2016-07-11 NOTE — Patient Instructions (Addendum)
Sorry for the bad news.  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  Good shoes with rigid bottom.  Dierdre HarnessKeen, Dansko, [place]rell or New balance greater then 700, [place]  pennsaid pinkie amount topically 2 times daily as needed.  Cream 2 times a day on the skin of the heel See me again if severe worsening pain and need injection.

## 2016-07-11 NOTE — Progress Notes (Signed)
[facility] [address] [place], [place] [address] Phone: [phone] Subjective:    I'm seeing this patient by the request  of:  Corwin LevinsJohn, James W, MD   CC: Left foot pain  OZH:[id number]:Subjective  Andreas OhmCharles G Correnti Jr. is a 56 y.o. male coming in with complaint of left foot pain. 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 Seem to be posterior medically patient having the trimalleolar fracture repair. Patient continues to have pain overall. Patient states that the brace he was wearing was helping but does not Wear it on a regular basis. Patient states still able to walk most of the time without any significant difficulty.    patient did have x-rays at last exam. 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  Past Medical History:  Diagnosis Date  . Allergic rhinitis   . Anemia of chronic disease   . Anxiety   . Autism   . 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 Depression   . GERD (gastroesophageal reflux disease)   . Hyperlipidemia   . Hypertension   . OSA (obstructive sleep apnea)    Past Surgical History:  Procedure Laterality Date  . ORIF ANKLE FRACTURE Left 03/02/2015   Procedure: OPEN REDUCTION INTERNAL FIXATION (ORIF) LEFT ANKLE TRIMALLEOLAR FRACTURE;  Surgeon: Toni ArthursJohn Hewitt, MD;  Location: [facility] OR;  Service: Orthopedics;  Laterality: Left;   Social History   Social History  . Marital status: Single    Spouse name: N/A  . Number of children: N/A  . Years of education: N/A   Social History Main Topics  . Smoking status: Never Smoker  . Smokeless tobacco: Never Used  . Alcohol use No  . Drug use: No  . Sexual activity: Not Asked   Other Topics Concern  . None   Social History Narrative  . None   Allergies  Allergen Reactions  . Fluoxetine Hcl     REACTION: ineffective   Family History  Problem Relation Age of Onset  . Heart disease Father   . Alcohol abuse Unknown   .  Arthritis Unknown   . Breast cancer Unknown   . Prostate cancer Unknown   . Hyperlipidemia Unknown   . Heart disease Unknown   . Stroke Unknown   . Kidney disease Unknown   . Diabetes Unknown   . Pancreatic cancer Unknown     Past medical history, social, surgical and family history all reviewed in electronic medical record.  No pertanent information unless stated regarding to the chief complaint.   Review of Systems: No headache, visual changes, nausea, vomiting, diarrhea, constipation, dizziness, abdominal pain, skin rash, fevers, chills, night sweats, weight loss, swollen lymph nodes,chest pain, shortness of breath, mood changes.  Positive muscle aches and body aches   Objective  Blood pressure (!) 144/90, pulse 86, height 5\' 10"  (1.778 m), weight 286 lb (129.7 kg), SpO2 95 %.   Physical examination is as of  07/11/16  General: No apparent distress alert and oriented x3 mood and affect normal, Cathi RoanLester Sheldon previous exam HEENT: Pupils equal, extraocular movements intact  Respiratory: Patient's speak in full sentences and does not appear short of breath  Cardiovascular: 1+ lower extremity edema, mild tender, no erythema  Skin: Warm dry intact with no signs of infection or rash on extremities or on axial skeleton.  Abdomen: Soft nontender  Neuro: Cranial nerves II through XII are intact, neurovascularly intact in all extremities with  2+ DTRs and 2+ pulses.  Lymph: No lymphadenopathy of posterior or anterior cervical chain or axillae bilaterally.  Gait normal with good balance and coordination.  MSK:  Non tender with full range of motion and good stability and symmetric strength and tone of shoulders, elbows, wrist, hip, knee bilaterally.  Ankle: Left Swelling noted. Severe narrowing of the joint space with crepitus with range of motion. Patient does have limited range of motion in all planes. Strength is 4 out of 5 compared to contralateral sign Stable lateral and medial  ligaments; squeeze test and kleiger test unremarkable; Talar dome moderately tender No pain at base of 5th MT; No tenderness over cuboid; No tenderness over N spot or navicular prominence No tenderness on posterior aspects of lateral and medial malleolus No sign of peroneal tendon subluxations or tenderness to palpation Negative tarsal tunnel tinel's Able to walk 4 steps. Contralateral ankle has swelling and mild pain as well.  Significant pes planus noted.     Impression and Recommendations:     This case required medical decision making of moderate complexity.      Note: This dictation was prepared with Dragon dictation along with smaller phrase technology. Any transcriptional errors that result from this process are unintentional.

## 2016-07-11 NOTE — Assessment & Plan Note (Signed)
Spent  25 minutes with patient face-to-face and had greater than 50% of counseling including as described  assessment and plan.Severe arthritic changes of the ankle noted. We discussed with patient at great length. This is with patient about the potential for custom orthotics. We discussed then if worsening symptoms into a posterior tibialis injection. Discussed with him that the limping that would be potentially curative of the pain that would be unfortunately an ankle replacement surgery. Encourage him to wear the brace on a more regular basis. Discussed him continuing the vitamin D supplementation. Patient will come back and see me again in 6 weeks if needed.

## 2016-07-25 ENCOUNTER — Other Ambulatory Visit (INDEPENDENT_AMBULATORY_CARE_PROVIDER_SITE_OTHER): Payer: Medicare Other

## 2016-07-25 ENCOUNTER — Ambulatory Visit (INDEPENDENT_AMBULATORY_CARE_PROVIDER_SITE_OTHER): Payer: Medicare Other | Admitting: Internal Medicine

## 2016-07-25 ENCOUNTER — Encounter: Payer: Self-pay | Admitting: Internal Medicine

## 2016-07-25 VITALS — BP 144/88 | HR 90 | Ht 70.0 in | Wt 290.0 lb

## 2016-07-25 DIAGNOSIS — L0390 Cellulitis, unspecified: Secondary | ICD-10-CM | POA: Insufficient documentation

## 2016-07-25 DIAGNOSIS — I5032 Chronic diastolic (congestive) heart failure: Secondary | ICD-10-CM

## 2016-07-25 DIAGNOSIS — I10 Essential (primary) hypertension: Secondary | ICD-10-CM

## 2016-07-25 DIAGNOSIS — L03119 Cellulitis of unspecified part of limb: Secondary | ICD-10-CM

## 2016-07-25 LAB — BASIC METABOLIC PANEL
BUN: 17 mg/dL (ref 6–23)
CO2: 31 meq/L (ref 19–32)
Calcium: 9.6 mg/dL (ref 8.4–10.5)
Chloride: 103 mEq/L (ref 96–112)
Creatinine, Ser: 1.16 mg/dL (ref 0.40–1.50)
GFR: 69.09 mL/min (ref >60.00)
GLUCOSE: 97 mg/dL (ref 70–99)
POTASSIUM: 4.3 meq/L (ref 3.5–5.1)
SODIUM: 141 meq/L (ref 135–145)

## 2016-07-25 MED ORDER — FUROSEMIDE 40 MG PO TABS: 40.0000 mg | ORAL_TABLET | Freq: Two times a day (BID) | ORAL | 11 refills | Status: DC

## 2016-07-25 NOTE — Assessment & Plan Note (Signed)
Mild elevated possibly volume related, to increase the lasix as above, o/w stable overall by history and exam, recent data reviewed with pt, and pt to continue medical treatment as before,  to f/u any worsening symptoms or concerns BP Readings from Last 3 Encounters:  07/25/16 (!) 144/88  07/11/16 (!) 144/90  06/20/16 136/84

## 2016-07-25 NOTE — Progress Notes (Signed)
Subjective:    Patient ID: Phillip Ohm., male    DOB: May 05, 1960, 56 y.o.   MRN: [id number]  HPI  Here to f/u acute swelling to legs, Pt denies chest pain, increased sob or doe, wheezing, orthopnea, PND, increased LE swelling, palpitations, dizziness or syncope, in fact thinks he may have improved, but wt has increased.  The foot redness has resolved and states he has had good compliance with meds Wt Readings from Last 3 Encounters:  07/25/16 290 lb (131.5 kg)  07/11/16 286 lb (129.7 kg)  06/20/16 284 lb (128.8 kg)  . Pt denies fever, wt loss, night sweats, loss of appetite, or other constitutional symptoms  Pt denies new neurological symptoms such as new headache, or facial or extremity weakness or numbness  Pt denies polydipsia, polyuria  Has not been checking wts at home as he does not have a scale Past Medical History:  Diagnosis Date  . Allergic rhinitis   . Anemia of chronic disease   . Anxiety   . Autism   . 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 Depression   . GERD (gastroesophageal reflux disease)   . Hyperlipidemia   . Hypertension   . OSA (obstructive sleep apnea)    Past Surgical History:  Procedure Laterality Date  . ORIF ANKLE FRACTURE Left 03/02/2015   Procedure: OPEN REDUCTION INTERNAL FIXATION (ORIF) LEFT ANKLE TRIMALLEOLAR FRACTURE;  Surgeon: Toni Arthurs, MD;  Location: [facility] OR;  Service: Orthopedics;  Laterality: Left;    reports that he has never smoked. He has never used smokeless tobacco. He reports that he does not drink alcohol or use drugs. family history includes Heart disease in his father. Allergies  Allergen Reactions  . Fluoxetine Hcl     REACTION: ineffective   Current Outpatient Prescriptions on File Prior to Visit  Medication Sig Dispense Refill  . apixaban (ELIQUIS) 5 MG TABS tablet Take 1 tablet (5 mg total) by mouth 2 (two) times daily. 60 tablet 11  . carvedilol (COREG) 6.25 MG tablet Take 1 tablet (6.25 mg total) by mouth 2 (two) times  daily with a meal. 60 tablet 5  . docusate sodium (COLACE) 100 MG capsule Take 1 capsule (100 mg total) by mouth 2 (two) times daily. While taking narcotic pain medicine. 30 capsule 0  . fesoterodine (TOVIAZ) 4 MG TB24 tablet Take 1 tablet (4 mg total) by mouth daily. 90 tablet 0  . gabapentin (NEURONTIN) 300 MG capsule Take 1 capsule (300 mg total) by mouth at bedtime. 90 capsule 1  . hydrocortisone 1 % lotion Apply 1 application topically 2 (two) times daily. 118 mL 0  . lisinopril (PRINIVIL,ZESTRIL) 20 MG tablet Take 1 tablet (20 mg total) by mouth daily. 90 tablet 3  . omeprazole (PRILOSEC) 20 MG capsule TAKE 1 CAPSULE (20 MG TOTAL) BY MOUTH DAILY. 90 capsule 3  . oxyCODONE (ROXICODONE) 5 MG immediate release tablet Take 1-2 tablets (5-10 mg total) by mouth every 4 (four) hours as needed for moderate pain or severe pain. 30 tablet 0  . rivaroxaban (XARELTO) 20 MG TABS tablet Take 20 mg by mouth daily with supper.    . sertraline (ZOLOFT) 100 MG tablet Take 1 tablet (100 mg total) by mouth daily. 90 tablet 3  . traMADol (ULTRAM) 50 MG tablet Take 1 tablet (50 mg total) by mouth every 8 (eight) hours as needed. 30 tablet 0  . traZODone (DESYREL) 50 MG tablet Take 1 tablet (50 mg total) by mouth at bedtime.  Overdue for yearly physical must see Md for refills 30 tablet 0  . Vitamin D, Ergocalciferol, (DRISDOL) 50000 units CAPS capsule Take 1 capsule (50,000 Units total) by mouth every 7 (seven) days. 8 capsule 0   No current facility-administered medications on file prior to visit.    Review of Systems  Constitutional: Negative for other unusual diaphoresis or sweats HENT: Negative for ear discharge or swelling Eyes: Negative for other worsening visual disturbances Respiratory: Negative for stridor or other swelling  Gastrointestinal: Negative for worsening distension or other blood Genitourinary: Negative for retention or other urinary change Musculoskeletal: Negative for other MSK pain or  swelling Skin: Negative for color change or other new lesions Neurological: Negative for worsening tremors and other numbness  Psychiatric/Behavioral: Negative for worsening agitation or other fatigue All other system neg per pt    Objective:   Physical Exam BP (!) 144/88   Pulse 90   Ht 5\' 10"  (1.778 m)   Wt 290 lb (131.5 kg)   SpO2 97%   BMI 41.61 kg/m  VS noted,  Constitutional: Pt appears in NAD HENT: Head: NCAT.  Right Ear: External ear normal.  Left Ear: External ear normal.  Eyes: . Pupils are equal, round, and reactive to light. Conjunctivae and EOM are normal Nose: without d/c or deformity Neck: Neck supple. Gross normal ROM Cardiovascular: Normal rate and regular rhythm.   Pulmonary/Chest: Effort normal and breath sounds without rales or wheezing.  Abd:  Soft, NT, ND, + BS, no organomegaly Neurological: Pt is alert. At baseline orientation, motor grossly intact Skin: Skin is warm. No rashes, other new lesions, 2+ bilat LE edema to knees Psychiatric: Pt behavior is normal without agitation  No other exam findings Lab Results  Component Value Date   WBC 6.0 06/20/2016   HGB 12.4 (L) 06/20/2016   HCT 36.4 (L) 06/20/2016   PLT 316.0 06/20/2016   GLUCOSE 107 (H) 06/20/2016   CHOL 187 06/20/2016   TRIG 203.0 (H) 06/20/2016   HDL 40.00 06/20/2016   LDLDIRECT 109.0 06/20/2016   LDLCALC 44 05/29/2011   ALT 16 06/20/2016   AST 20 06/20/2016   NA 141 06/20/2016   K 4.0 06/20/2016   CL 107 06/20/2016   CREATININE 1.18 06/20/2016   BUN 18 06/20/2016   CO2 29 06/20/2016   TSH 0.85 06/20/2016   PSA 0.40 06/20/2016   INR 1.05 03/02/2015   HGBA1C 5.6 05/29/2011       Assessment & Plan:

## 2016-07-25 NOTE — Assessment & Plan Note (Signed)
Not improved overall despite good med compliance reported per pt, to increase the lasix 40 bid, check daily wts at home if possible, for BMP today, and f/u in 2 weeks

## 2016-07-25 NOTE — Assessment & Plan Note (Signed)
Resolved, no further antibx needed 

## 2016-07-25 NOTE — Patient Instructions (Addendum)
OK to increase the lasix (furosemide) to 40 mg twice per day  Please continue all other medications as before, and refills have been done if requested.  Please have the pharmacy call with any other refills you may need.  Please keep your appointments with your specialists as you may have planned  Please go to the LAB in the Basement (turn left off the elevator) for the tests to be done today  You will be contacted by phone if any changes need to be made immediately.  Otherwise, you will receive a letter about your results with an explanation, but please check with MyChart first.  Please return in 2 weeks,, or sooner if needed

## 2016-08-08 ENCOUNTER — Ambulatory Visit (INDEPENDENT_AMBULATORY_CARE_PROVIDER_SITE_OTHER): Payer: Medicare Other | Admitting: Internal Medicine

## 2016-08-08 ENCOUNTER — Encounter: Payer: Self-pay | Admitting: Internal Medicine

## 2016-08-08 VITALS — BP 126/86 | HR 98 | Ht 70.0 in | Wt 290.0 lb

## 2016-08-08 DIAGNOSIS — I5032 Chronic diastolic (congestive) heart failure: Secondary | ICD-10-CM | POA: Diagnosis not present

## 2016-08-08 DIAGNOSIS — I10 Essential (primary) hypertension: Secondary | ICD-10-CM | POA: Diagnosis not present

## 2016-08-08 DIAGNOSIS — J309 Allergic rhinitis, unspecified: Secondary | ICD-10-CM

## 2016-08-08 DIAGNOSIS — R140 Abdominal distension (gaseous): Secondary | ICD-10-CM | POA: Diagnosis not present

## 2016-08-08 MED ORDER — FEXOFENADINE HCL 180 MG PO TABS: 180.0000 mg | ORAL_TABLET | Freq: Every day | ORAL | 5 refills | Status: DC

## 2016-08-08 NOTE — Progress Notes (Signed)
Subjective:    Patient ID: Phillip Ohmharles G Basden Jr., male    DOB: 1960-09-11, 56 y.o.   MRN: [id number]  HPI   Here to f/u, pt has hx of lower intellectual capacity and anxiety, became "scared" of taking the lasix with essentially no improvement in swelling of the legs  Is here alone.   BP Readings from Last 3 Encounters:  08/08/16 126/86  07/25/16 (!) 144/88  07/11/16 (!) 144/90   Wt Readings from Last 3 Encounters:  08/08/16 290 lb (131.5 kg)  07/25/16 290 lb (131.5 kg)  07/11/16 286 lb (129.7 kg)  Does also mentions worsening abd swelling for unclear reasons, but Denies worsening reflux, abd pain, dysphagia, n/v, bowel change or blood.  Does have several wks ongoing nasal allergy symptoms with clearish congestion, itch and sneezing, without fever, pain, ST, cough, swelling or wheezing.  Pt denies chest pain, increased sob or doe, wheezing, orthopnea, PND, palpitations, dizziness or syncope. No fever.  No other new complaints except Does have several wks ongoing nasal allergy symptoms with clearish congestion, itch and sneezing Past Medical History:  Diagnosis Date  . Allergic rhinitis   . Anemia of chronic disease   . Anxiety   . Autism   . 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 Depression   . GERD (gastroesophageal reflux disease)   . Hyperlipidemia   . Hypertension   . OSA (obstructive sleep apnea)    Past Surgical History:  Procedure Laterality Date  . ORIF ANKLE FRACTURE Left 03/02/2015   Procedure: OPEN REDUCTION INTERNAL FIXATION (ORIF) LEFT ANKLE TRIMALLEOLAR FRACTURE;  Surgeon: Toni ArthursJohn Hewitt, MD;  Location: [facility] OR;  Service: Orthopedics;  Laterality: Left;    reports that he has never smoked. He has never used smokeless tobacco. He reports that he does not drink alcohol or use drugs. family history includes Alcohol abuse in his unknown relative; Arthritis in his unknown relative; Breast cancer in his unknown relative; Diabetes in his unknown relative; Heart disease in his father  and unknown relative; Hyperlipidemia in his unknown relative; Kidney disease in his unknown relative; Pancreatic cancer in his unknown relative; Prostate cancer in his unknown relative; Stroke in his unknown relative. Allergies  Allergen Reactions  . Fluoxetine Hcl     REACTION: ineffective   Current Outpatient Prescriptions on File Prior to Visit  Medication Sig Dispense Refill  . apixaban (ELIQUIS) 5 MG TABS tablet Take 1 tablet (5 mg total) by mouth 2 (two) times daily. 60 tablet 11  . carvedilol (COREG) 6.25 MG tablet Take 1 tablet (6.25 mg total) by mouth 2 (two) times daily with a meal. 60 tablet 5  . docusate sodium (COLACE) 100 MG capsule Take 1 capsule (100 mg total) by mouth 2 (two) times daily. While taking narcotic pain medicine. 30 capsule 0  . fesoterodine (TOVIAZ) 4 MG TB24 tablet Take 1 tablet (4 mg total) by mouth daily. 90 tablet 0  . furosemide (LASIX) 40 MG tablet Take 1 tablet (40 mg total) by mouth 2 (two) times daily. 60 tablet 11  . gabapentin (NEURONTIN) 300 MG capsule Take 1 capsule (300 mg total) by mouth at bedtime. 90 capsule 1  . hydrocortisone 1 % lotion Apply 1 application topically 2 (two) times daily. 118 mL 0  . lisinopril (PRINIVIL,ZESTRIL) 20 MG tablet Take 1 tablet (20 mg total) by mouth daily. 90 tablet 3  . omeprazole (PRILOSEC) 20 MG capsule TAKE 1 CAPSULE (20 MG TOTAL) BY MOUTH DAILY. 90 capsule 3  . oxyCODONE (  ROXICODONE) 5 MG immediate release tablet Take 1-2 tablets (5-10 mg total) by mouth every 4 (four) hours as needed for moderate pain or severe pain. 30 tablet 0  . rivaroxaban (XARELTO) 20 MG TABS tablet Take 20 mg by mouth daily with supper.    . sertraline (ZOLOFT) 100 MG tablet Take 1 tablet (100 mg total) by mouth daily. 90 tablet 3  . traMADol (ULTRAM) 50 MG tablet Take 1 tablet (50 mg total) by mouth every 8 (eight) hours as needed. 30 tablet 0  . traZODone (DESYREL) 50 MG tablet Take 1 tablet (50 mg total) by mouth at bedtime. Overdue for  yearly physical must see Md for refills 30 tablet 0  . Vitamin D, Ergocalciferol, (DRISDOL) 50000 units CAPS capsule Take 1 capsule (50,000 Units total) by mouth every 7 (seven) days. 8 capsule 0   No current facility-administered medications on file prior to visit.    Review of Systems  Constitutional: Negative for other unusual diaphoresis or sweats HENT: Negative for ear discharge or swelling Eyes: Negative for other worsening visual disturbances Respiratory: Negative for stridor or other swelling  Gastrointestinal: Negative for worsening distension or other blood Genitourinary: Negative for retention or other urinary change Musculoskeletal: Negative for other MSK pain or swelling Skin: Negative for color change or other new lesions Neurological: Negative for worsening tremors and other numbness  Psychiatric/Behavioral: Negative for worsening agitation or other fatigue All other system neg per pt, though limited due to low intellect    Objective:   Physical Exam BP 126/86   Pulse 98   Ht 5\' 10"  (1.778 m)   Wt 290 lb (131.5 kg)   SpO2 100%   BMI 41.61 kg/m  VS noted,  Constitutional: Pt appears in NAD HENT: Head: NCAT.  Right Ear: External ear normal.  Left Ear: External ear normal.  Bilat tm's with mild erythema.  Max sinus areas non tender.  Pharynx with mild erythema, no exudate Eyes: . Pupils are equal, round, and reactive to light. Conjunctivae and EOM are normal Nose: without d/c or deformity Neck: Neck supple. Gross normal ROM Cardiovascular: Normal rate and regular rhythm.   Pulmonary/Chest: Effort normal and breath sounds without rales or wheezing.  Abd:  Soft, NT, + BS, no organomegaly, ? Mild distension but difficult to appreciate fluid wave Neurological: Pt is alert. At baseline orientation, motor grossly intact Skin: Skin is warm. No rashes, other new lesions, no LE edema Psychiatric: Pt behavior is normal without agitation  No other exam findings Lab Results   Component Value Date   WBC 6.0 06/20/2016   HGB 12.4 (L) 06/20/2016   HCT 36.4 (L) 06/20/2016   PLT 316.0 06/20/2016   GLUCOSE 97 07/25/2016   CHOL 187 06/20/2016   TRIG 203.0 (H) 06/20/2016   HDL 40.00 06/20/2016   LDLDIRECT 109.0 06/20/2016   LDLCALC 44 05/29/2011   ALT 16 06/20/2016   AST 20 06/20/2016   NA 141 07/25/2016   K 4.3 07/25/2016   CL 103 07/25/2016   CREATININE 1.16 07/25/2016   BUN 17 07/25/2016   CO2 31 07/25/2016   TSH 0.85 06/20/2016   PSA 0.40 06/20/2016   INR 1.05 03/02/2015   HGBA1C 5.6 05/29/2011      Assessment & Plan:

## 2016-08-08 NOTE — Patient Instructions (Signed)
Please take all new medication as prescribed - the allegra for allergies  Please take the lasix, as this is the way to get the fluid off  You will be contacted regarding the referral for: Abdomen ultrasound to check the liver and kidneys and see if there is any fluid in the abdomen  Please continue all other medications as before, and refills have been done if requested.  Please have the pharmacy call with any other refills you may need.  Please keep your appointments with your specialists as you may have planned

## 2016-08-11 NOTE — Assessment & Plan Note (Signed)
stable overall by history and exam, recent data reviewed with pt, and pt to continue medical treatment as before,  to f/u any worsening symptoms or concerns BP Readings from Last 3 Encounters:  08/08/16 126/86  07/25/16 (!) 144/88  07/11/16 (!) 144/90

## 2016-08-11 NOTE — Assessment & Plan Note (Addendum)
Pt admits noncompliance with tx, d/w pt and reassured, ok to take the current dose of lasix, f/u next visit, also for abd u/s r/o ascites

## 2016-08-11 NOTE — Assessment & Plan Note (Signed)
Mild, for allegra prn,  to f/u any worsening symptoms or concerns

## 2016-08-13 ENCOUNTER — Other Ambulatory Visit: Payer: Self-pay | Admitting: Family Medicine

## 2016-08-13 NOTE — Telephone Encounter (Signed)
Refill done.  

## 2016-08-20 ENCOUNTER — Encounter: Payer: Self-pay | Admitting: Internal Medicine

## 2016-09-10 ENCOUNTER — Encounter: Payer: Self-pay | Admitting: Internal Medicine

## 2016-09-10 ENCOUNTER — Ambulatory Visit
Admission: RE | Admit: 2016-09-10 | Discharge: 2016-09-10 | Disposition: A | Discharge status: Home / Self Care | Payer: Medicaid Other | Source: Ambulatory Visit | Attending: Internal Medicine | Admitting: Internal Medicine

## 2016-09-10 DIAGNOSIS — R140 Abdominal distension (gaseous): Secondary | ICD-10-CM

## 2016-09-10 DIAGNOSIS — K8020 Calculus of gallbladder without cholecystitis without obstruction: Secondary | ICD-10-CM | POA: Diagnosis not present

## 2016-10-14 ENCOUNTER — Other Ambulatory Visit: Payer: Self-pay | Admitting: Internal Medicine

## 2016-10-23 DIAGNOSIS — Z23 Encounter for immunization: Secondary | ICD-10-CM | POA: Diagnosis not present

## 2017-02-26 ENCOUNTER — Ambulatory Visit (INDEPENDENT_AMBULATORY_CARE_PROVIDER_SITE_OTHER): Payer: Medicare Other | Admitting: Internal Medicine

## 2017-02-26 ENCOUNTER — Other Ambulatory Visit (INDEPENDENT_AMBULATORY_CARE_PROVIDER_SITE_OTHER): Payer: Medicare Other

## 2017-02-26 ENCOUNTER — Encounter: Payer: Self-pay | Admitting: Internal Medicine

## 2017-02-26 VITALS — BP 120/78 | HR 102 | Temp 98.5°F | Ht 70.0 in | Wt 303.0 lb

## 2017-02-26 DIAGNOSIS — F418 Other specified anxiety disorders: Secondary | ICD-10-CM | POA: Diagnosis not present

## 2017-02-26 DIAGNOSIS — J019 Acute sinusitis, unspecified: Secondary | ICD-10-CM | POA: Diagnosis not present

## 2017-02-26 DIAGNOSIS — R350 Frequency of micturition: Secondary | ICD-10-CM

## 2017-02-26 DIAGNOSIS — M79672 Pain in left foot: Secondary | ICD-10-CM | POA: Diagnosis not present

## 2017-02-26 DIAGNOSIS — H9193 Unspecified hearing loss, bilateral: Secondary | ICD-10-CM | POA: Diagnosis not present

## 2017-02-26 DIAGNOSIS — Z23 Encounter for immunization: Secondary | ICD-10-CM | POA: Diagnosis not present

## 2017-02-26 DIAGNOSIS — N320 Bladder-neck obstruction: Secondary | ICD-10-CM

## 2017-02-26 DIAGNOSIS — E785 Hyperlipidemia, unspecified: Secondary | ICD-10-CM | POA: Diagnosis not present

## 2017-02-26 DIAGNOSIS — H6123 Impacted cerumen, bilateral: Secondary | ICD-10-CM

## 2017-02-26 LAB — URINALYSIS, ROUTINE W REFLEX MICROSCOPIC
Bilirubin Urine: NEGATIVE
Hgb urine dipstick: NEGATIVE
KETONES UR: NEGATIVE
LEUKOCYTES UA: NEGATIVE
Nitrite: NEGATIVE
PH: 5.5 (ref 5.0–8.0)
RBC / HPF: NONE SEEN (ref 0–?)
TOTAL PROTEIN, URINE-UPE24: 30 — AB
URINE GLUCOSE: NEGATIVE
UROBILINOGEN UA: 0.2 (ref 0.0–1.0)
WBC, UA: NONE SEEN (ref 0–?)

## 2017-02-26 LAB — LIPID PANEL
CHOLESTEROL: 210 mg/dL — AB (ref 0–200)
HDL: 40.7 mg/dL (ref >39.00)
NonHDL: 169.67
Total CHOL/HDL Ratio: 5
Triglycerides: 326 mg/dL — ABNORMAL HIGH (ref 0.0–149.0)
VLDL: 65.2 mg/dL — AB (ref 0.0–40.0)

## 2017-02-26 LAB — CBC WITH DIFFERENTIAL/PLATELET
BASOS PCT: 0.9 % (ref 0.0–3.0)
Basophils Absolute: 0.1 10*3/uL (ref 0.0–0.1)
EOS PCT: 3.5 % (ref 0.0–5.0)
Eosinophils Absolute: 0.2 10*3/uL (ref 0.0–0.7)
HCT: 39.2 % (ref 39.0–52.0)
HEMOGLOBIN: 12.9 g/dL — AB (ref 13.0–17.0)
LYMPHS ABS: 1.8 10*3/uL (ref 0.7–4.0)
Lymphocytes Relative: 26.5 % (ref 12.0–46.0)
MCHC: 33 g/dL (ref 30.0–36.0)
MCV: 88.5 fl (ref 78.0–100.0)
MONO ABS: 0.7 10*3/uL (ref 0.1–1.0)
Monocytes Relative: 10.1 % (ref 3.0–12.0)
NEUTROS ABS: 4 10*3/uL (ref 1.4–7.7)
Neutrophils Relative %: 59 % (ref 43.0–77.0)
PLATELETS: 310 10*3/uL (ref 150.0–400.0)
RBC: 4.43 Mil/uL (ref 4.22–5.81)
RDW: 14.9 % (ref 11.5–15.5)
WBC: 6.8 10*3/uL (ref 4.0–10.5)

## 2017-02-26 LAB — HEPATIC FUNCTION PANEL
ALT: 25 U/L (ref 0–53)
AST: 24 U/L (ref 0–37)
Albumin: 4.1 g/dL (ref 3.5–5.2)
Alkaline Phosphatase: 93 U/L (ref 39–117)
BILIRUBIN TOTAL: 0.4 mg/dL (ref 0.2–1.2)
Bilirubin, Direct: 0.1 mg/dL (ref 0.0–0.3)
Total Protein: 7.7 g/dL (ref 6.0–8.3)

## 2017-02-26 LAB — BASIC METABOLIC PANEL
BUN: 23 mg/dL (ref 6–23)
CO2: 32 mEq/L (ref 19–32)
Calcium: 8.8 mg/dL (ref 8.4–10.5)
Chloride: 101 mEq/L (ref 96–112)
Creatinine, Ser: 1.25 mg/dL (ref 0.40–1.50)
GFR: 63.25 mL/min (ref >60.00)
GLUCOSE: 110 mg/dL — AB (ref 70–99)
POTASSIUM: 3.8 meq/L (ref 3.5–5.1)
SODIUM: 138 meq/L (ref 135–145)

## 2017-02-26 LAB — LDL CHOLESTEROL, DIRECT: Direct LDL: 121 mg/dL

## 2017-02-26 LAB — TSH: TSH: 2 u[IU]/mL (ref 0.35–4.50)

## 2017-02-26 LAB — PSA: PSA: 0.32 ng/mL (ref 0.10–4.00)

## 2017-02-26 MED ORDER — AZITHROMYCIN 250 MG PO TABS: ORAL_TABLET | ORAL | 1 refills | Status: DC

## 2017-02-26 MED ORDER — BUPROPION HCL ER (XL) 150 MG PO TB24: 150.0000 mg | ORAL_TABLET | Freq: Every day | ORAL | 3 refills | Status: DC

## 2017-02-26 MED ORDER — HYDROCODONE-HOMATROPINE 5-1.5 MG/5ML PO SYRP: 5.0000 mL | ORAL_SOLUTION | Freq: Four times a day (QID) | ORAL | 0 refills | Status: AC | PRN

## 2017-02-26 NOTE — Patient Instructions (Signed)
You had the flu shot today  Your ears were irrigated of wax today  Please take all new medication as prescribed - the wellbutrin in addition for the depression  Please take all new medication as prescribed - the antibiotic, and pills for coughing  You can also take Mucinex (or it's generic off brand) for congestion, and tylenol as needed for pain.  Please continue all other medications as before, and refills have been done if requested.  Please have the pharmacy call with any other refills you may need.  Please continue your efforts at being more active, low cholesterol diet, and weight control.  You are otherwise up to date with prevention measures today.  Please keep your appointments with your specialists as you may have planned  Please consider seeing Sports Medicine in this office for the left foot pain if it doe not improve  You will be contacted regarding the referral for:  Urology for the bladder issue  Please go to the LAB in the Basement (turn left off the elevator) for the tests to be done today  You will be contacted by phone if any changes need to be made immediately.  Otherwise, you will receive a letter about your results with an explanation, but please check with MyChart first.  Please remember to sign up for MyChart if you have not done so, as this will be important to you in the future with finding out test results, communicating by private email, and scheduling acute appointments online when needed.  Please return in 3 months, or sooner if needed

## 2017-02-26 NOTE — Progress Notes (Signed)
Subjective:    Patient ID: Phillip Ohm., male    DOB: 1960/12/18, 57 y.o.   MRN: [id number]  HPI   Here with 2-3 days acute onset fever, facial pain, pressure, headache, general weakness and malaise, and greenish d/c, with mild ST and cough, but pt denies chest pain, wheezing, increased sob or doe, orthopnea, PND, increased LE swelling, palpitations, dizziness or syncope.  Also has bilat hearing loss - ? Wax again, but no other ear pain or d/c.   Also c/o left instep plantar foot pain without skin change or swelling.  Also c/o increased mild depression, specifically asking for add on med, denies SI or HI but still struggling.  C/o random increased bladder frequency with somewhat slower stream, asks for urology referral. Denies urinary symptoms such as dysuria, flank pain, hematuria or n/v, fever, chills. Pt denies new neurological symptoms such as new headache, or facial or extremity weakness or numbness   Pt denies polydipsia, polyuria Past Medical History:  Diagnosis Date  . Allergic rhinitis   . Anemia of chronic disease   . Anxiety   . Autism   . 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 Depression   . GERD (gastroesophageal reflux disease)   . Hyperlipidemia   . Hypertension   . OSA (obstructive sleep apnea)    Past Surgical History:  Procedure Laterality Date  . ORIF ANKLE FRACTURE Left 03/02/2015   Procedure: OPEN REDUCTION INTERNAL FIXATION (ORIF) LEFT ANKLE TRIMALLEOLAR FRACTURE;  Surgeon: Toni Arthurs, MD;  Location: [facility] OR;  Service: Orthopedics;  Laterality: Left;    reports that  has never smoked. he has never used smokeless tobacco. He reports that he does not drink alcohol or use drugs. family history includes Alcohol abuse in his unknown relative; Arthritis in his unknown relative; Breast cancer in his unknown relative; Diabetes in his unknown relative; Heart disease in his father and unknown relative; Hyperlipidemia in his unknown relative; Kidney disease in his unknown  relative; Pancreatic cancer in his unknown relative; Prostate cancer in his unknown relative; Stroke in his unknown relative. Allergies  Allergen Reactions  . Fluoxetine Hcl     REACTION: ineffective   Current Outpatient Medications on File Prior to Visit  Medication Sig Dispense Refill  . apixaban (ELIQUIS) 5 MG TABS tablet Take 1 tablet (5 mg total) by mouth 2 (two) times daily. 60 tablet 11  . carvedilol (COREG) 6.25 MG tablet Take 1 tablet (6.25 mg total) by mouth 2 (two) times daily with a meal. 60 tablet 5  . docusate sodium (COLACE) 100 MG capsule Take 1 capsule (100 mg total) by mouth 2 (two) times daily. While taking narcotic pain medicine. 30 capsule 0  . fesoterodine (TOVIAZ) 4 MG TB24 tablet Take 1 tablet (4 mg total) by mouth daily. 90 tablet 0  . fexofenadine (ALLEGRA) 180 MG tablet Take 1 tablet (180 mg total) by mouth daily. 30 tablet 5  . furosemide (LASIX) 40 MG tablet Take 1 tablet (40 mg total) by mouth 2 (two) times daily. 60 tablet 11  . gabapentin (NEURONTIN) 300 MG capsule TAKE 1 CAPSULE (300 MG TOTAL) BY MOUTH AT BEDTIME. 90 capsule 1  . hydrocortisone 1 % lotion Apply 1 application topically 2 (two) times daily. 118 mL 0  . lisinopril (PRINIVIL,ZESTRIL) 20 MG tablet Take 1 tablet (20 mg total) by mouth daily. 90 tablet 3  . omeprazole (PRILOSEC) 20 MG capsule TAKE 1 CAPSULE (20 MG TOTAL) BY MOUTH DAILY. 90 capsule 3  .  oxyCODONE (ROXICODONE) 5 MG immediate release tablet Take 1-2 tablets (5-10 mg total) by mouth every 4 (four) hours as needed for moderate pain or severe pain. 30 tablet 0  . rivaroxaban (XARELTO) 20 MG TABS tablet Take 20 mg by mouth daily with supper.    . sertraline (ZOLOFT) 100 MG tablet Take 1 tablet (100 mg total) by mouth daily. 90 tablet 3  . traMADol (ULTRAM) 50 MG tablet Take 1 tablet (50 mg total) by mouth every 8 (eight) hours as needed. 30 tablet 0  . traZODone (DESYREL) 50 MG tablet Take 1 tablet (50 mg total) by mouth at bedtime. Overdue  for yearly physical must see Md for refills 30 tablet 0  . Vitamin D, Ergocalciferol, (DRISDOL) 50000 units CAPS capsule TAKE ONE CAPSULE BY MOUTH EVERY 7 DAYS 8 capsule 0   No current facility-administered medications on file prior to visit.    Review of Systems  Constitutional: Negative for other unusual diaphoresis or sweats HENT: Negative for ear discharge or swelling Eyes: Negative for other worsening visual disturbances Respiratory: Negative for stridor or other swelling  Gastrointestinal: Negative for worsening distension or other blood Genitourinary: Negative for retention or other urinary change Musculoskeletal: Negative for other MSK pain or swelling Skin: Negative for color change or other new lesions Neurological: Negative for worsening tremors and other numbness  Psychiatric/Behavioral: Negative for worsening agitation or other fatigue \\All  other system neg per pt    Objective:   Physical Exam BP 120/78   Pulse (!) 102   Temp 98.5 F (36.9 C) (Oral)   Ht 5\' 10"  (1.778 m)   Wt (!) 303 lb (137.4 kg)   SpO2 97%   BMI 43.48 kg/m  VS noted,  Constitutional: Pt appears in NAD HENT: Head: NCAT.  Right Ear: External ear normal.  Left Ear: External ear normal.  Eyes: . Pupils are equal, round, and reactive to light. Conjunctivae and EOM are normal Bilat tm's with mild erythema.  Max sinus areas mild tender.  Pharynx with mild erythema, no exudate  Nose: without d/c or deformity Neck: Neck supple. Gross normal ROM Cardiovascular: Normal rate and regular rhythm.   Pulmonary/Chest: Effort normal and breath sounds without rales or wheezing.  Abd:  Soft, NT, ND, + BS, no organomegaly Left plantar midfoot without red, tender, swelling or ulcer Neurological: Pt is alert. At baseline orientation, motor grossly intact Skin: Skin is warm. No rashes, other new lesions, no LE edema Psychiatric: Pt behavior is normal without agitation , + depressed nervous affect No other exam  findings     Assessment & Plan:

## 2017-02-27 ENCOUNTER — Other Ambulatory Visit: Payer: Self-pay | Admitting: Internal Medicine

## 2017-02-27 ENCOUNTER — Telehealth: Payer: Self-pay

## 2017-02-27 ENCOUNTER — Encounter: Payer: Self-pay | Admitting: Internal Medicine

## 2017-02-27 MED ORDER — ROSUVASTATIN CALCIUM 10 MG PO TABS: 10.0000 mg | ORAL_TABLET | Freq: Every day | ORAL | 3 refills | Status: DC

## 2017-02-27 NOTE — Assessment & Plan Note (Signed)
stable overall by history and exam, recent data reviewed with pt, and pt to continue medical treatment as before,  to f/u any worsening symptoms or concerns Lab Results  Component Value Date   LDLCALC 44 05/29/2011  \

## 2017-02-27 NOTE — Assessment & Plan Note (Addendum)
Mild to mod, for antibx course,  to f/u any worsening symptoms or concerns  Note:  Total time for pt hx, exam, review of record with pt in the room, determination of diagnoses and plan for further eval and tx is > 40 min, with over 50% spent in coordination and counseling of patient including the differential dx, tx, further evaluation and other management of acute sinus infection, bilat ear wax impactions and hearing loss, depression, left foot pain, HLD and urinary frequency

## 2017-02-27 NOTE — Assessment & Plan Note (Signed)
Ok for addon wellbutrin asd, cont all other tx,  to f/u any worsening symptoms or concerns

## 2017-02-27 NOTE — Telephone Encounter (Signed)
-----   Message from Corwin LevinsJames W John, MD sent at 02/27/2017  1:55 PM EST ----- Letter sent, cont same tx except  The test results show that your current treatment is OK, except the LDL cholesterol is mildly elevated.  Please follow a lower cholesterol diet, and start generic Crestor 10 mg per day to get this under control.  I will send a new prescription, and you should hear from the office as well.    Ein Rijo to please inform pt, I will do rx

## 2017-02-27 NOTE — Assessment & Plan Note (Signed)
Etiology unclear, consider podiatry, declines for now

## 2017-02-27 NOTE — Assessment & Plan Note (Signed)
Due to wax impactions, resolved with irrigation

## 2017-02-27 NOTE — Telephone Encounter (Signed)
Called pt, LVM.   CRM created.  

## 2017-02-27 NOTE — Assessment & Plan Note (Signed)
Etiology unclear, for urology referral 

## 2017-02-28 ENCOUNTER — Other Ambulatory Visit: Payer: Self-pay | Admitting: Internal Medicine

## 2017-02-28 MED ORDER — ATORVASTATIN CALCIUM 20 MG PO TABS: 20.0000 mg | ORAL_TABLET | Freq: Every day | ORAL | 3 refills | Status: DC

## 2017-02-28 NOTE — Telephone Encounter (Signed)
Pt crestor changed to lipitor due to insurance coverage

## 2017-04-16 ENCOUNTER — Encounter: Payer: Self-pay | Admitting: Internal Medicine

## 2017-04-24 ENCOUNTER — Other Ambulatory Visit: Payer: Self-pay | Admitting: Internal Medicine

## 2017-05-03 ENCOUNTER — Other Ambulatory Visit: Payer: Self-pay | Admitting: Internal Medicine

## 2017-05-04 ENCOUNTER — Other Ambulatory Visit: Payer: Self-pay | Admitting: Internal Medicine

## 2017-05-26 ENCOUNTER — Ambulatory Visit: Payer: Medicare Other | Admitting: Internal Medicine

## 2017-06-11 ENCOUNTER — Ambulatory Visit (INDEPENDENT_AMBULATORY_CARE_PROVIDER_SITE_OTHER): Payer: Medicare Other | Admitting: Internal Medicine

## 2017-06-11 ENCOUNTER — Encounter: Payer: Self-pay | Admitting: Internal Medicine

## 2017-06-11 VITALS — BP 136/88 | HR 95 | Temp 98.0°F | Ht 70.0 in | Wt 304.0 lb

## 2017-06-11 DIAGNOSIS — L989 Disorder of the skin and subcutaneous tissue, unspecified: Secondary | ICD-10-CM

## 2017-06-11 DIAGNOSIS — R739 Hyperglycemia, unspecified: Secondary | ICD-10-CM | POA: Diagnosis not present

## 2017-06-11 DIAGNOSIS — M79672 Pain in left foot: Secondary | ICD-10-CM | POA: Diagnosis not present

## 2017-06-11 DIAGNOSIS — R350 Frequency of micturition: Secondary | ICD-10-CM | POA: Diagnosis not present

## 2017-06-11 DIAGNOSIS — E785 Hyperlipidemia, unspecified: Secondary | ICD-10-CM

## 2017-06-11 DIAGNOSIS — H9192 Unspecified hearing loss, left ear: Secondary | ICD-10-CM | POA: Diagnosis not present

## 2017-06-11 DIAGNOSIS — H109 Unspecified conjunctivitis: Secondary | ICD-10-CM

## 2017-06-11 LAB — POCT GLYCOSYLATED HEMOGLOBIN (HGB A1C): Hemoglobin A1C: 5.8 % — AB (ref 4.0–5.6)

## 2017-06-11 MED ORDER — ATORVASTATIN CALCIUM 40 MG PO TABS: 40.0000 mg | ORAL_TABLET | Freq: Every day | ORAL | 3 refills | Status: DC

## 2017-06-11 MED ORDER — METHYLPREDNISOLONE ACETATE 80 MG/ML IJ SUSP
80.0000 mg | Freq: Once | INTRAMUSCULAR | Status: AC
  Administered 2017-06-11: 80 mg via INTRAMUSCULAR

## 2017-06-11 MED ORDER — PREDNISONE 10 MG PO TABS: ORAL_TABLET | ORAL | 0 refills | Status: DC

## 2017-06-11 MED ORDER — CEPHALEXIN 500 MG PO CAPS: 500.0000 mg | ORAL_CAPSULE | Freq: Three times a day (TID) | ORAL | 0 refills | Status: AC

## 2017-06-11 MED ORDER — OXYBUTYNIN CHLORIDE ER 5 MG PO TB24: 5.0000 mg | ORAL_TABLET | Freq: Every day | ORAL | 3 refills | Status: DC

## 2017-06-11 MED ORDER — ERYTHROMYCIN 5 MG/GM OP OINT: 1.0000 "application " | TOPICAL_OINTMENT | Freq: Four times a day (QID) | OPHTHALMIC | 0 refills | Status: AC

## 2017-06-11 NOTE — Progress Notes (Signed)
Subjective:    Patient ID: Phillip Ohm., male    DOB: Dec 21, 1960, 57 y.o.   MRN: [id number]  HPI  Here to f/u with numerous concerns, including left > right eye swelling, redness, pain for 3-4 days, maybe felt warm but not sure of fever, just feels like "somethings in my eye."  No vision change except for blurriness with the fluid builds up.  Also c/o persistent mild sob, hard to lose wt.  C/o new onset 2 wks left ear hearing loss - ? Wax.  Has a "dark mole" to the left lateral chest in the midline below the left axilla, wondering if needs to be addressed.  Also has urinary frequency but Denies urinary symptoms such as dysuria, urgency, flank pain, hematuria or n/v, fever, chills. Has nocturia 2-3 times per night, requests med for OAB.  Also has knot to the instep of the left foot he noticed a few weeks ago with mild discomfort, wondering if this is some kind of bone spur similar to that he had before left medial ankle surgury.  Pt denies chest pain, wheezing, orthopnea, PND, increased LE swelling, palpitations, dizziness or syncope.   Pt denies polydipsia, polyuria, admits to not always being rigorous about this lower chol diet.  No other interval change or other new complaint Past Medical History:  Diagnosis Date  . Allergic rhinitis   . Anemia of chronic disease   . Anxiety   . Autism   . 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 Depression   . GERD (gastroesophageal reflux disease)   . Hyperlipidemia   . Hypertension   . OSA (obstructive sleep apnea)    Past Surgical History:  Procedure Laterality Date  . ORIF ANKLE FRACTURE Left 03/02/2015   Procedure: OPEN REDUCTION INTERNAL FIXATION (ORIF) LEFT ANKLE TRIMALLEOLAR FRACTURE;  Surgeon: Toni Arthurs, MD;  Location: [facility] OR;  Service: Orthopedics;  Laterality: Left;    reports that he has never smoked. He has never used smokeless tobacco. He reports that he does not drink alcohol or use drugs. family history includes Alcohol abuse in his  unknown relative; Arthritis in his unknown relative; Breast cancer in his unknown relative; Diabetes in his unknown relative; Heart disease in his father and unknown relative; Hyperlipidemia in his unknown relative; Kidney disease in his unknown relative; Pancreatic cancer in his unknown relative; Prostate cancer in his unknown relative; Stroke in his unknown relative. Allergies  Allergen Reactions  . Fluoxetine Hcl     REACTION: ineffective   Current Outpatient Medications on File Prior to Visit  Medication Sig Dispense Refill  . apixaban (ELIQUIS) 5 MG TABS tablet Take 1 tablet (5 mg total) by mouth 2 (two) times daily. 60 tablet 11  . buPROPion (WELLBUTRIN XL) 150 MG 24 hr tablet Take 1 tablet (150 mg total) by mouth daily. 90 tablet 3  . carvedilol (COREG) 6.25 MG tablet Take 1 tablet (6.25 mg total) by mouth 2 (two) times daily with a meal. 60 tablet 5  . docusate sodium (COLACE) 100 MG capsule Take 1 capsule (100 mg total) by mouth 2 (two) times daily. While taking narcotic pain medicine. 30 capsule 0  . fesoterodine (TOVIAZ) 4 MG TB24 tablet Take 1 tablet (4 mg total) by mouth daily. 90 tablet 0  . fexofenadine (ALLEGRA) 180 MG tablet Take 1 tablet (180 mg total) by mouth daily. 30 tablet 5  . furosemide (LASIX) 40 MG tablet Take 1 tablet (40 mg total) by mouth 2 (two) times daily.  60 tablet 11  . gabapentin (NEURONTIN) 300 MG capsule TAKE 1 CAPSULE (300 MG TOTAL) BY MOUTH AT BEDTIME. 90 capsule 1  . hydrocortisone 1 % lotion Apply 1 application topically 2 (two) times daily. 118 mL 0  . lisinopril (PRINIVIL,ZESTRIL) 20 MG tablet TAKE 1 TABLET (20 MG TOTAL) BY MOUTH DAILY. 90 tablet 3  . omeprazole (PRILOSEC) 20 MG capsule TAKE 1 CAPSULE (20 MG TOTAL) BY MOUTH DAILY. 90 capsule 3  . oxyCODONE (ROXICODONE) 5 MG immediate release tablet Take 1-2 tablets (5-10 mg total) by mouth every 4 (four) hours as needed for moderate pain or severe pain. 30 tablet 0  . rivaroxaban (XARELTO) 20 MG TABS  tablet Take 20 mg by mouth daily with supper.    . sertraline (ZOLOFT) 100 MG tablet TAKE 1 TABLET BY MOUTH EVERY DAY 90 tablet 2  . traMADol (ULTRAM) 50 MG tablet Take 1 tablet (50 mg total) by mouth every 8 (eight) hours as needed. 30 tablet 0  . traZODone (DESYREL) 50 MG tablet Take 1 tablet (50 mg total) by mouth at bedtime. Overdue for yearly physical must see Md for refills 30 tablet 0  . Vitamin D, Ergocalciferol, (DRISDOL) 50000 units CAPS capsule TAKE ONE CAPSULE BY MOUTH EVERY 7 DAYS 8 capsule 0   No current facility-administered medications on file prior to visit.    Review of Systems  Constitutional: Negative for other unusual diaphoresis or sweats HENT: Negative for ear discharge or swelling Eyes: Negative for other worsening visual disturbances Respiratory: Negative for stridor or other swelling  Gastrointestinal: Negative for worsening distension or other blood Genitourinary: Negative for retention or other urinary change Musculoskeletal: Negative for other MSK pain or swelling Skin: Negative for color change or other new lesions Neurological: Negative for worsening tremors and other numbness  Psychiatric/Behavioral: Negative for worsening agitation or other fatigue All other system neg per pt    Objective:   Physical Exam BP 136/88   Pulse 95   Temp 98 F (36.7 C) (Oral)   Ht  (1.778 m)   Wt (!) 304 lb (137.9 kg)   SpO2 100%   BMI 43.62 kg/m  VS noted, ? Mild ill Constitutional: Pt appears in NAD HENT: Head: NCAT.  Right Ear: External ear normal.  Left Ear: External ear normal.  Left wax impaction resolved with irrigation Eyes: . Pupils are equal, round, and reactive to light. Conjunctivae with bilat erythema and watery d/c, with mild right upper lip swelling, but large left upper and lower lid sweling and tender to touch, and EOM are normal Nose: without d/c or deformity Neck: Neck supple. Gross normal ROM Cardiovascular: Normal rate and regular rhythm.    Pulmonary/Chest: Effort normal and breath sounds without rales or wheezing.  Abd:  Soft, NT, ND, + BS, no organomegaly Neurological: Pt is alert. At baseline orientation, motor grossly intact Skin: Skin is warm. No rashes, other new lesions, no LE edema, 1/2 cm raised benign appearing keratotic appearing lesion to left lateral chest Left instep with mild tender knot to first plantar compartment, mobile not bony  Psychiatric: Pt behavior is normal without agitation  No other exam findings Lab Results  Component Value Date   WBC 6.8 02/26/2017   HGB 12.9 (L) 02/26/2017   HCT 39.2 02/26/2017   PLT 310.0 02/26/2017   GLUCOSE 110 (H) 02/26/2017   CHOL 210 (H) 02/26/2017   TRIG 326.0 (H) 02/26/2017   HDL 40.70 02/26/2017   LDLDIRECT 121.0 02/26/2017   LDLCALC  44 05/29/2011   ALT 25 02/26/2017   AST 24 02/26/2017   NA 138 02/26/2017   K 3.8 02/26/2017   CL 101 02/26/2017   CREATININE 1.25 02/26/2017   BUN 23 02/26/2017   CO2 32 02/26/2017   TSH 2.00 02/26/2017   PSA 0.32 02/26/2017   INR 1.05 03/02/2015   HGBA1C 5.6 05/29/2011    POCT today A1c - 5.8    Assessment & Plan:

## 2017-06-11 NOTE — Assessment & Plan Note (Addendum)
Left eye >> right, possibly allergic but cant r/o infectious, for depomedrol IM 80, eryth op ointment  And cephalexin asd, f/u with optho if not improved  Note:  Total time for pt hx, exam, review of record with pt in the room, determination of diagnoses and plan for further eval and tx is > 40 min, with over 50% spent in coordination and counseling of patient including the differential dx, tx, further evaluation and other management of conjunctivitis, hyperglycemia, HLD, left hearing loss, left foot pain, skin lesion, urinary frequency

## 2017-06-11 NOTE — Assessment & Plan Note (Signed)
stable overall by history and exam, recent data reviewed with pt, and pt to continue medical treatment as before,  to f/u any worsening symptoms or concerns Lab Results  Component Value Date   HGBA1C 5.8 (A) 06/11/2017

## 2017-06-11 NOTE — Assessment & Plan Note (Signed)
C/w mild tendonitis, ok for pain control,  to f/u any worsening symptoms or concerns

## 2017-06-11 NOTE — Assessment & Plan Note (Signed)
Improved s/p irrigation 

## 2017-06-11 NOTE — Patient Instructions (Signed)
You had the steroid shot today  Your A1c fingerstick test was [facility] today (that means the sugar was OK)  Your left ear was irrigated of wax blockage today  OK to increase the lipitor to 40 mg per day  Please take all new medication as prescribed - the prednisone, eye ointment antibiotic, and pill antibiotic, as well as the medication for probable overactive bladder (oxybutinin)  Please continue all other medications as before, and refills have been done if requested.  Please have the pharmacy call with any other refills you may need.  Please continue your efforts at being more active, low cholesterol diet, and weight control.  Please keep your appointments with your specialists as you may have planned  Please return in 3 months, or sooner if needed

## 2017-06-11 NOTE — Assessment & Plan Note (Signed)
C/w benign lesion left lateral chest, ok to follow

## 2017-06-11 NOTE — Assessment & Plan Note (Signed)
Mild uncontrolled, for increased lipitor 40 qd 

## 2017-06-11 NOTE — Assessment & Plan Note (Signed)
Ok to try oxybutinin er % qd,  to f/u any worsening symptoms or concerns

## 2017-07-19 ENCOUNTER — Other Ambulatory Visit: Payer: Self-pay | Admitting: Internal Medicine

## 2017-07-31 ENCOUNTER — Ambulatory Visit (INDEPENDENT_AMBULATORY_CARE_PROVIDER_SITE_OTHER): Payer: Medicare Other | Admitting: Family

## 2017-07-31 ENCOUNTER — Encounter: Payer: Self-pay | Admitting: Family

## 2017-07-31 VITALS — BP 150/90 | HR 86 | Temp 98.1°F | Ht 70.0 in | Wt 297.8 lb

## 2017-07-31 DIAGNOSIS — S91312A Laceration without foreign body, left foot, initial encounter: Secondary | ICD-10-CM | POA: Diagnosis not present

## 2017-07-31 DIAGNOSIS — Z23 Encounter for immunization: Secondary | ICD-10-CM

## 2017-07-31 DIAGNOSIS — L089 Local infection of the skin and subcutaneous tissue, unspecified: Secondary | ICD-10-CM | POA: Diagnosis not present

## 2017-07-31 MED ORDER — SULFAMETHOXAZOLE-TRIMETHOPRIM 800-160 MG PO TABS: 1.0000 | ORAL_TABLET | Freq: Two times a day (BID) | ORAL | 0 refills | Status: DC

## 2017-07-31 NOTE — Progress Notes (Signed)
Phillip Butler. is a 57 y.o. male with the following history as recorded in [employer]:  Patient Active Problem List   Diagnosis Date Noted  . Conjunctivitis of left eye 06/11/2017  . Left ear hearing loss 06/11/2017  . Skin lesion 06/11/2017  . Hyperglycemia 06/11/2017  . Cellulitis 07/25/2016  . 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 Right leg pain 06/20/2016  . Left ankle pain 06/13/2016  . Acute sinus infection 04/12/2016  . RLS (restless legs syndrome) 04/12/2016  . Respiratory abnormality   . Obesity (BMI 30-39.9) 03/02/2015  . Chronic diastolic heart failure (HCC)   . Autism   . DVT, lower extremity, distal, acute (HCC)   . Closed trimalleolar fracture of left ankle 02/26/2015  . Hyperlipidemia 02/19/2014  . Depression with anxiety 02/19/2014  . Acute respiratory failure with hypoxia (HCC) 02/19/2014  . Normocytic anemia 02/19/2014  . Community acquired pneumonia 02/18/2014  . Left foot pain 02/09/2013  . Urinary frequency 02/03/2012  . Bilateral hearing loss 02/03/2012  . Preventative health care 11/28/2010  . Obstructive sleep apnea 08/14/2009  . Essential hypertension 02/16/2007  . Allergic rhinitis 02/16/2007  . GERD 02/16/2007    Current Outpatient Medications  Medication Sig Dispense Refill  . atorvastatin (LIPITOR) 40 MG tablet Take 1 tablet (40 mg total) by mouth daily. 90 tablet 3  . buPROPion (WELLBUTRIN XL) 150 MG 24 hr tablet Take 1 tablet (150 mg total) by mouth daily. 90 tablet 3  . carvedilol (COREG) 6.25 MG tablet Take 1 tablet (6.25 mg total) by mouth 2 (two) times daily with a meal. 60 tablet 5  . docusate sodium (COLACE) 100 MG capsule Take 1 capsule (100 mg total) by mouth 2 (two) times daily. While taking narcotic pain medicine. 30 capsule 0  . ELIQUIS 5 MG TABS tablet TAKE 1 TABLET BY MOUTH TWICE A DAY 60 tablet 11  . fesoterodine (TOVIAZ) 4 MG TB24 tablet Take 1 tablet (4 mg total) by mouth daily. 90 tablet 0  . fexofenadine  (ALLEGRA) 180 MG tablet Take 1 tablet (180 mg total) by mouth daily. 30 tablet 5  . furosemide (LASIX) 40 MG tablet Take 1 tablet (40 mg total) by mouth 2 (two) times daily. 60 tablet 11  . gabapentin (NEURONTIN) 300 MG capsule TAKE 1 CAPSULE (300 MG TOTAL) BY MOUTH AT BEDTIME. 90 capsule 1  . hydrocortisone 1 % lotion Apply 1 application topically 2 (two) times daily. 118 mL 0  . lisinopril (PRINIVIL,ZESTRIL) 20 MG tablet TAKE 1 TABLET (20 MG TOTAL) BY MOUTH DAILY. 90 tablet 3  . omeprazole (PRILOSEC) 20 MG capsule TAKE 1 CAPSULE (20 MG TOTAL) BY MOUTH DAILY. 90 capsule 3  . oxybutynin (DITROPAN-XL) 5 MG 24 hr tablet Take 1 tablet (5 mg total) by mouth at bedtime. 90 tablet 3  . oxyCODONE (ROXICODONE) 5 MG immediate release tablet Take 1-2 tablets (5-10 mg total) by mouth every 4 (four) hours as needed for moderate pain or severe pain. 30 tablet 0  . predniSONE (DELTASONE) 10 MG tablet 3 tabs by mouth per day for 3 days,2tabs per day for 3 days,1tab per day for 3 days 18 tablet 0  . rivaroxaban (XARELTO) 20 MG TABS tablet Take 20 mg by mouth daily with supper.    . sertraline (ZOLOFT) 100 MG tablet TAKE 1 TABLET BY MOUTH EVERY DAY 90 tablet 2  . sulfamethoxazole-trimethoprim (BACTRIM DS,SEPTRA DS) 800-160 MG tablet Take 1 tablet by mouth 2 (two) times daily. 14 tablet 0  .  traMADol (ULTRAM) 50 MG tablet Take 1 tablet (50 mg total) by mouth every 8 (eight) hours as needed. 30 tablet 0  . traZODone (DESYREL) 50 MG tablet Take 1 tablet (50 mg total) by mouth at bedtime. Overdue for yearly physical must see Md for refills 30 tablet 0  . Vitamin D, Ergocalciferol, (DRISDOL) 50000 units CAPS capsule TAKE ONE CAPSULE BY MOUTH EVERY 7 DAYS 8 capsule 0   No current facility-administered medications for this visit.     Allergies: Fluoxetine hcl  Past Medical History:  Diagnosis Date  . Allergic rhinitis   . Anemia of chronic disease   . Anxiety   . Autism   . 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  Depression   . GERD (gastroesophageal reflux disease)   . Hyperlipidemia   . Hypertension   . OSA (obstructive sleep apnea)     Past Surgical History:  Procedure Laterality Date  . ORIF ANKLE FRACTURE Left 03/02/2015   Procedure: OPEN REDUCTION INTERNAL FIXATION (ORIF) LEFT ANKLE TRIMALLEOLAR FRACTURE;  Surgeon: Toni Arthurs, MD;  Location: [facility] OR;  Service: Orthopedics;  Laterality: Left;    Family History  Problem Relation Age of Onset  . Heart disease Father   . Alcohol abuse Unknown   . Arthritis Unknown   . Breast cancer Unknown   . Prostate cancer Unknown   . Hyperlipidemia Unknown   . Heart disease Unknown   . Stroke Unknown   . Kidney disease Unknown   . Diabetes Unknown   . Pancreatic cancer Unknown     Social History   Tobacco Use  . Smoking status: Never Smoker  . Smokeless tobacco: Never Used  Substance Use Topics  . Alcohol use: No    Subjective:  Patient presents with concerns for "cut" on his outer left foot; noted 2 days ago; seemed to happen when he cut his foot on the outside of a metal piece on his car; has been keeping clean with alcohol; concerned about increasing pain recently; no drainage noted today; no fever; patient is a limited historian; he is very concerned about the date of his last Tdap- explained that it is up to date but he notes he would prefer to have the vaccine updated today;   Objective:  Vitals:   07/31/17 0813  BP: (!) 150/90  Pulse: 86  Temp: 98.1 F (36.7 C)  TempSrc: Oral  SpO2: 94%  Weight: 297 lb 12.8 oz (135.1 kg)  Height: 5\' 10"  (1.778 m)    General: Well developed, well nourished, in no acute distress  Skin : Warm and dry. Healing superficial laceration noted on outer left foot; mild erythema surrounding but no warmth or drainage seen;  Head: Normocephalic and atraumatic  Lungs: Respirations unlabored;  Musculoskeletal: No deformities; no active joint inflammation  Extremities: No edema, cyanosis, clubbing  Vessels:  Symmetric bilaterally  Neurologic: Alert and oriented; speech intact; face symmetrical; moves all extremities well; CNII-XII intact without focal deficit   Assessment:  1. Skin infection     Plan:  Reassurance that patient has been doing a very good job taking care of the wound; will have him stop using alcohol and stressed need to keep area clean and dry; Rx for Bactrim DS bid x7 days; will update Tdap per his request; follow-up worse, no better.   No follow-ups on file.  Orders Placed This Encounter  Procedures  . Tdap vaccine greater than or equal to 7yo IM    Requested Prescriptions   Signed  Prescriptions Disp Refills  . sulfamethoxazole-trimethoprim (BACTRIM DS,SEPTRA DS) 800-160 MG tablet 14 tablet 0    Sig: Take 1 tablet by mouth 2 (two) times daily.

## 2017-09-11 ENCOUNTER — Ambulatory Visit: Payer: Medicare Other | Admitting: Internal Medicine

## 2017-09-11 DIAGNOSIS — Z0289 Encounter for other administrative examinations: Secondary | ICD-10-CM

## 2017-09-23 ENCOUNTER — Encounter: Payer: Self-pay | Admitting: Internal Medicine

## 2017-09-23 ENCOUNTER — Other Ambulatory Visit (INDEPENDENT_AMBULATORY_CARE_PROVIDER_SITE_OTHER): Payer: Medicare Other

## 2017-09-23 ENCOUNTER — Ambulatory Visit (INDEPENDENT_AMBULATORY_CARE_PROVIDER_SITE_OTHER): Payer: Medicare Other | Admitting: Internal Medicine

## 2017-09-23 VITALS — BP 130/86 | HR 100 | Temp 98.6°F | Ht 70.0 in | Wt 298.0 lb

## 2017-09-23 DIAGNOSIS — I10 Essential (primary) hypertension: Secondary | ICD-10-CM

## 2017-09-23 DIAGNOSIS — Z23 Encounter for immunization: Secondary | ICD-10-CM

## 2017-09-23 DIAGNOSIS — R350 Frequency of micturition: Secondary | ICD-10-CM

## 2017-09-23 DIAGNOSIS — R739 Hyperglycemia, unspecified: Secondary | ICD-10-CM

## 2017-09-23 DIAGNOSIS — M722 Plantar fascial fibromatosis: Secondary | ICD-10-CM

## 2017-09-23 LAB — BASIC METABOLIC PANEL
BUN: 24 mg/dL — AB (ref 6–23)
CHLORIDE: 103 meq/L (ref 96–112)
CO2: 25 meq/L (ref 19–32)
Calcium: 9.3 mg/dL (ref 8.4–10.5)
Creatinine, Ser: 1.33 mg/dL (ref 0.40–1.50)
GFR: 58.76 mL/min — ABNORMAL LOW (ref >60.00)
Glucose, Bld: 89 mg/dL (ref 70–99)
POTASSIUM: 3.8 meq/L (ref 3.5–5.1)
Sodium: 138 mEq/L (ref 135–145)

## 2017-09-23 LAB — LIPID PANEL
CHOL/HDL RATIO: 5
CHOLESTEROL: 220 mg/dL — AB (ref 0–200)
HDL: 41.4 mg/dL (ref >39.00)
LDL CALC: 143 mg/dL — AB (ref 0–99)
NonHDL: 178.61
TRIGLYCERIDES: 176 mg/dL — AB (ref 0.0–149.0)
VLDL: 35.2 mg/dL (ref 0.0–40.0)

## 2017-09-23 LAB — HEPATIC FUNCTION PANEL
ALT: 23 U/L (ref 0–53)
AST: 23 U/L (ref 0–37)
Albumin: 4.5 g/dL (ref 3.5–5.2)
Alkaline Phosphatase: 93 U/L (ref 39–117)
Bilirubin, Direct: 0.1 mg/dL (ref 0.0–0.3)
Total Bilirubin: 0.4 mg/dL (ref 0.2–1.2)
Total Protein: 8.4 g/dL — ABNORMAL HIGH (ref 6.0–8.3)

## 2017-09-23 LAB — URINALYSIS, ROUTINE W REFLEX MICROSCOPIC
Bilirubin Urine: NEGATIVE
Ketones, ur: NEGATIVE
Leukocytes, UA: NEGATIVE
Nitrite: NEGATIVE
Specific Gravity, Urine: >=1.03 — AB (ref 1.000–1.030)
Total Protein, Urine: 30 — AB
Urine Glucose: NEGATIVE
Urobilinogen, UA: 0.2 (ref 0.0–1.0)
pH: 5.5 (ref 5.0–8.0)

## 2017-09-23 LAB — HEMOGLOBIN A1C: HEMOGLOBIN A1C: 6.1 % (ref 4.6–6.5)

## 2017-09-23 MED ORDER — AZELASTINE HCL 0.05 % OP SOLN: 1.0000 [drp] | Freq: Two times a day (BID) | OPHTHALMIC | 12 refills | Status: DC

## 2017-09-23 NOTE — Assessment & Plan Note (Signed)
stable overall by history and exam, recent data reviewed with pt, and pt to continue medical treatment as before,  to f/u any worsening symptoms or concerns  

## 2017-09-23 NOTE — Patient Instructions (Signed)
You had the flu shot today  Your left ear was irrigated of wax today  Please take all new medication as prescribed - the eye drops  Please continue all other medications as before, and refills have been done if requested.  Please have the pharmacy call with any other refills you may need.  Please continue your efforts at being more active, low cholesterol diet, and weight control.  Please keep your appointments with your specialists as you may have planned  Please go to the LAB in the Basement (turn left off the elevator) for the tests to be done today  You will be contacted by phone if any changes need to be made immediately.  Otherwise, you will receive a letter about your results with an explanation, but please check with MyChart first.  Please remember to sign up for MyChart if you have not done so, as this will be important to you in the future with finding out test results, communicating by private email, and scheduling acute appointments online when needed.  Please return in 6 months, or sooner if needed

## 2017-09-23 NOTE — Assessment & Plan Note (Signed)
Exam benign, ok for UA with labs

## 2017-09-23 NOTE — Assessment & Plan Note (Signed)
Ok for podiatry referral 

## 2017-09-23 NOTE — Progress Notes (Signed)
Subjective:    Patient ID: Phillip Ohm., male    DOB: [birth year], 57 y.o.   MRN: [id number]  HPI  Here with c/o left heel pain and tender, mild to mod, constant, worse to walk, better to sit, sharp. Also with eye allergy symtpoms with itch and weepiness, also Does have several wks ongoing nasal allergy symptoms with clearish congestion, itch and sneezing, without fever, pain, ST, cough, swelling or wheezing.  Also with left ear reduced hearing for 1 wk.  Also with some mild pain on urination for several wks,but Denies urinary symptoms such as frequency, urgency, flank pain, hematuria or n/v, fever, chills. Pt denies chest pain, increased sob or doe, wheezing, orthopnea, PND, increased LE swelling, palpitations, dizziness or syncope.   Pt denies polydipsia, polyuria, Past Medical History:  Diagnosis Date  . Allergic rhinitis   . Anemia of chronic disease   . Anxiety   . Autism   . 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 Depression   . GERD (gastroesophageal reflux disease)   . Hyperlipidemia   . Hypertension   . OSA (obstructive sleep apnea)    Past Surgical History:  Procedure Laterality Date  . ORIF ANKLE FRACTURE Left 03/02/2015   Procedure: OPEN REDUCTION INTERNAL FIXATION (ORIF) LEFT ANKLE TRIMALLEOLAR FRACTURE;  Surgeon: Toni Arthurs, MD;  Location: [facility] OR;  Service: Orthopedics;  Laterality: Left;    reports that he has never smoked. He has never used smokeless tobacco. He reports that he does not drink alcohol or use drugs. family history includes Alcohol abuse in his unknown relative; Arthritis in his unknown relative; Breast cancer in his unknown relative; Diabetes in his unknown relative; Heart disease in his father and unknown relative; Hyperlipidemia in his unknown relative; Kidney disease in his unknown relative; Pancreatic cancer in his unknown relative; Prostate cancer in his unknown relative; Stroke in his unknown relative. Allergies  Allergen Reactions  . Fluoxetine Hcl      REACTION: ineffective   Current Outpatient Medications on File Prior to Visit  Medication Sig Dispense Refill  . atorvastatin (LIPITOR) 40 MG tablet Take 1 tablet (40 mg total) by mouth daily. 90 tablet 3  . buPROPion (WELLBUTRIN XL) 150 MG 24 hr tablet Take 1 tablet (150 mg total) by mouth daily. 90 tablet 3  . carvedilol (COREG) 6.25 MG tablet Take 1 tablet (6.25 mg total) by mouth 2 (two) times daily with a meal. 60 tablet 5  . docusate sodium (COLACE) 100 MG capsule Take 1 capsule (100 mg total) by mouth 2 (two) times daily. While taking narcotic pain medicine. 30 capsule 0  . ELIQUIS 5 MG TABS tablet TAKE 1 TABLET BY MOUTH TWICE A DAY 60 tablet 11  . fesoterodine (TOVIAZ) 4 MG TB24 tablet Take 1 tablet (4 mg total) by mouth daily. 90 tablet 0  . furosemide (LASIX) 40 MG tablet Take 1 tablet (40 mg total) by mouth 2 (two) times daily. 60 tablet 11  . gabapentin (NEURONTIN) 300 MG capsule TAKE 1 CAPSULE (300 MG TOTAL) BY MOUTH AT BEDTIME. 90 capsule 1  . hydrocortisone 1 % lotion Apply 1 application topically 2 (two) times daily. 118 mL 0  . lisinopril (PRINIVIL,ZESTRIL) 20 MG tablet TAKE 1 TABLET (20 MG TOTAL) BY MOUTH DAILY. 90 tablet 3  . omeprazole (PRILOSEC) 20 MG capsule TAKE 1 CAPSULE (20 MG TOTAL) BY MOUTH DAILY. 90 capsule 3  . oxybutynin (DITROPAN-XL) 5 MG 24 hr tablet Take 1 tablet (5 mg total) by mouth  at bedtime. 90 tablet 3  . oxyCODONE (ROXICODONE) 5 MG immediate release tablet Take 1-2 tablets (5-10 mg total) by mouth every 4 (four) hours as needed for moderate pain or severe pain. 30 tablet 0  . predniSONE (DELTASONE) 10 MG tablet 3 tabs by mouth per day for 3 days,2tabs per day for 3 days,1tab per day for 3 days 18 tablet 0  . rivaroxaban (XARELTO) 20 MG TABS tablet Take 20 mg by mouth daily with supper.    . sertraline (ZOLOFT) 100 MG tablet TAKE 1 TABLET BY MOUTH EVERY DAY 90 tablet 2  . sulfamethoxazole-trimethoprim (BACTRIM DS,SEPTRA DS) 800-160 MG tablet Take 1  tablet by mouth 2 (two) times daily. 14 tablet 0  . traMADol (ULTRAM) 50 MG tablet Take 1 tablet (50 mg total) by mouth every 8 (eight) hours as needed. 30 tablet 0  . traZODone (DESYREL) 50 MG tablet Take 1 tablet (50 mg total) by mouth at bedtime. Overdue for yearly physical must see Md for refills 30 tablet 0  . Vitamin D, Ergocalciferol, (DRISDOL) 50000 units CAPS capsule TAKE ONE CAPSULE BY MOUTH EVERY 7 DAYS 8 capsule 0  . fexofenadine (ALLEGRA) 180 MG tablet Take 1 tablet (180 mg total) by mouth daily. 30 tablet 5   No current facility-administered medications on file prior to visit.     Review of Systems  Constitutional: Negative for other unusual diaphoresis or sweats HENT: Negative for ear discharge or swelling Eyes: Negative for other worsening visual disturbances Respiratory: Negative for stridor or other swelling  Gastrointestinal: Negative for worsening distension or other blood Genitourinary: Negative for retention or other urinary change Musculoskeletal: Negative for other MSK pain or swelling Skin: Negative for color change or other new lesions Neurological: Negative for worsening tremors and other numbness  Psychiatric/Behavioral: Negative for worsening agitation or other fatigue ALl other system neg per pt    Objective:   Physical Exam BP 130/86   Pulse 100   Temp 98.6 F (37 C) (Oral)   Ht 5\' 10"  (1.778 m)   Wt 298 lb (135.2 kg)   SpO2 95%   BMI 42.76 kg/m ' VS noted,  Constitutional: Pt appears in NAD HENT: Head: NCAT.  Right Ear: External ear normal.  Left Ear: External ear normal.  Eyes: . Pupils are equal, round, and reactive to light. Conjunctivae and EOM are normal Left ear wax impaction resolved with irrigation Left heel with plantar tenderness without red, ulcer or swelling Nose: without d/c or deformity Neck: Neck supple. Gross normal ROM Cardiovascular: Normal rate and regular rhythm.   Pulmonary/Chest: Effort normal and breath sounds without  rales or wheezing.  Abd:  Soft, NT, ND, + BS, no organomegaly Neurological: Pt is alert. At baseline orientation, motor grossly intact Skin: Skin is warm. No rashes, other new lesions, no LE edema Psychiatric: Pt behavior is normal without agitation  No other exam findings    Assessment & Plan:

## 2017-09-24 ENCOUNTER — Encounter: Payer: Self-pay | Admitting: Internal Medicine

## 2017-09-24 ENCOUNTER — Other Ambulatory Visit: Payer: Self-pay | Admitting: Internal Medicine

## 2017-09-24 LAB — URINE CULTURE
MICRO NUMBER:: 91049156
SPECIMEN QUALITY:: ADEQUATE

## 2017-09-24 MED ORDER — ATORVASTATIN CALCIUM 40 MG PO TABS: 40.0000 mg | ORAL_TABLET | Freq: Every day | ORAL | 3 refills | Status: DC

## 2017-09-25 ENCOUNTER — Telehealth: Payer: Self-pay

## 2017-09-25 NOTE — Telephone Encounter (Signed)
-----   Message from Corwin Levins, MD sent at 09/24/2017  9:25 PM EDT ----- Letter sent, cont same tx except  The test results show that your current treatment is OK, except the LDL cholesterol is high.  Please restart the lipitor 40 mg per day,  I will send a new prescription and you should hear from the office as well.    Phillip Butler to please inform pt, I will do rx

## 2017-09-25 NOTE — Telephone Encounter (Signed)
Pt has been informed of results and expressed understanding.  °

## 2017-10-06 ENCOUNTER — Ambulatory Visit: Payer: Medicare Other | Admitting: Podiatry

## 2017-10-06 ENCOUNTER — Ambulatory Visit (INDEPENDENT_AMBULATORY_CARE_PROVIDER_SITE_OTHER): Payer: Medicare Other | Admitting: Sports Medicine

## 2017-10-06 ENCOUNTER — Ambulatory Visit (INDEPENDENT_AMBULATORY_CARE_PROVIDER_SITE_OTHER): Payer: Medicare Other

## 2017-10-06 ENCOUNTER — Encounter: Payer: Self-pay | Admitting: Sports Medicine

## 2017-10-06 VITALS — BP 153/98 | HR 100

## 2017-10-06 DIAGNOSIS — M722 Plantar fascial fibromatosis: Secondary | ICD-10-CM

## 2017-10-06 DIAGNOSIS — M779 Enthesopathy, unspecified: Secondary | ICD-10-CM

## 2017-10-06 DIAGNOSIS — M79672 Pain in left foot: Secondary | ICD-10-CM

## 2017-10-06 DIAGNOSIS — M778 Other enthesopathies, not elsewhere classified: Secondary | ICD-10-CM

## 2017-10-06 MED ORDER — TRIAMCINOLONE ACETONIDE 10 MG/ML IJ SUSP: 10.0000 mg | Freq: Once | INTRAMUSCULAR | Status: DC

## 2017-10-06 NOTE — Progress Notes (Signed)
Subjective: Phillip OhmCharles G Bracken Jr. is a 57 y.o. male patient presents to office with complaint of moderate heel pain on the left. Patient admits to post static dyskinesia for a few weeks in duration. Admits soreness that caused limping. Reports that he has a history of Ankle as well. Patient has treated this problem with icing and stretching with no relief. Denies any other pedal complaints.   Review of Systems  Musculoskeletal: Positive for joint pain.  All other systems reviewed and are negative.    Patient Active Problem List   Diagnosis Date Noted  . Plantar fasciitis, left 09/23/2017  . Conjunctivitis of left eye 06/11/2017  . Left ear hearing loss 06/11/2017  . Skin lesion 06/11/2017  . Hyperglycemia 06/11/2017  . Cellulitis 07/25/2016  . 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 Right leg pain 06/20/2016  . Left ankle pain 06/13/2016  . Acute sinus infection 04/12/2016  . RLS (restless legs syndrome) 04/12/2016  . Respiratory abnormality   . Obesity (BMI 30-39.9) 03/02/2015  . Chronic diastolic heart failure (HCC)   . Autism   . DVT, lower extremity, distal, acute (HCC)   . Closed trimalleolar fracture of left ankle 02/26/2015  . Hyperlipidemia 02/19/2014  . Depression with anxiety 02/19/2014  . Acute respiratory failure with hypoxia (HCC) 02/19/2014  . Normocytic anemia 02/19/2014  . Community acquired pneumonia 02/18/2014  . Left foot pain 02/09/2013  . Urinary frequency 02/03/2012  . Bilateral hearing loss 02/03/2012  . Preventative health care 11/28/2010  . Obstructive sleep apnea 08/14/2009  . Essential hypertension 02/16/2007  . Allergic rhinitis 02/16/2007  . GERD 02/16/2007    Current Outpatient Medications on File Prior to Visit  Medication Sig Dispense Refill  . atorvastatin (LIPITOR) 40 MG tablet Take 1 tablet (40 mg total) by mouth daily. 90 tablet 3  . azelastine (OPTIVAR) 0.05 % ophthalmic solution Place 1 drop into both eyes 2 (two) times  daily. 6 mL 12  . buPROPion (WELLBUTRIN XL) 150 MG 24 hr tablet Take 1 tablet (150 mg total) by mouth daily. 90 tablet 3  . carvedilol (COREG) 6.25 MG tablet Take 1 tablet (6.25 mg total) by mouth 2 (two) times daily with a meal. 60 tablet 5  . docusate sodium (COLACE) 100 MG capsule Take 1 capsule (100 mg total) by mouth 2 (two) times daily. While taking narcotic pain medicine. 30 capsule 0  . ELIQUIS 5 MG TABS tablet TAKE 1 TABLET BY MOUTH TWICE A DAY 60 tablet 11  . fesoterodine (TOVIAZ) 4 MG TB24 tablet Take 1 tablet (4 mg total) by mouth daily. 90 tablet 0  . fexofenadine (ALLEGRA) 180 MG tablet Take 1 tablet (180 mg total) by mouth daily. 30 tablet 5  . furosemide (LASIX) 40 MG tablet Take 1 tablet (40 mg total) by mouth 2 (two) times daily. 60 tablet 11  . gabapentin (NEURONTIN) 300 MG capsule TAKE 1 CAPSULE (300 MG TOTAL) BY MOUTH AT BEDTIME. 90 capsule 1  . hydrocortisone 1 % lotion Apply 1 application topically 2 (two) times daily. 118 mL 0  . lisinopril (PRINIVIL,ZESTRIL) 20 MG tablet TAKE 1 TABLET (20 MG TOTAL) BY MOUTH DAILY. 90 tablet 3  . omeprazole (PRILOSEC) 20 MG capsule TAKE 1 CAPSULE (20 MG TOTAL) BY MOUTH DAILY. 90 capsule 3  . oxybutynin (DITROPAN-XL) 5 MG 24 hr tablet Take 1 tablet (5 mg total) by mouth at bedtime. 90 tablet 3  . oxyCODONE (ROXICODONE) 5 MG immediate release tablet Take 1-2 tablets (5-10 mg  total) by mouth every 4 (four) hours as needed for moderate pain or severe pain. 30 tablet 0  . predniSONE (DELTASONE) 10 MG tablet 3 tabs by mouth per day for 3 days,2tabs per day for 3 days,1tab per day for 3 days 18 tablet 0  . rivaroxaban (XARELTO) 20 MG TABS tablet Take 20 mg by mouth daily with supper.    . sertraline (ZOLOFT) 100 MG tablet TAKE 1 TABLET BY MOUTH EVERY DAY 90 tablet 2  . sulfamethoxazole-trimethoprim (BACTRIM DS,SEPTRA DS) 800-160 MG tablet Take 1 tablet by mouth 2 (two) times daily. 14 tablet 0  . traMADol (ULTRAM) 50 MG tablet Take 1 tablet (50 mg  total) by mouth every 8 (eight) hours as needed. 30 tablet 0  . traZODone (DESYREL) 50 MG tablet Take 1 tablet (50 mg total) by mouth at bedtime. Overdue for yearly physical must see Md for refills 30 tablet 0  . Vitamin D, Ergocalciferol, (DRISDOL) 50000 units CAPS capsule TAKE ONE CAPSULE BY MOUTH EVERY 7 DAYS 8 capsule 0   No current facility-administered medications on file prior to visit.     Allergies  Allergen Reactions  . Fluoxetine Hcl     REACTION: ineffective    Objective: Physical Exam General: The patient is alert and oriented x3 in no acute distress.  Dermatology: Skin is warm, dry and supple bilateral lower extremities. Nails 1-10 are short and thick. There is no erythema, edema, no eccymosis, no open lesions present. Integument is otherwise unremarkable.  Vascular: Dorsalis Pedis pulse and Posterior Tibial pulse are 1/4 bilateral. Capillary fill time is immediate to all digits.  Neurological: Grossly intact to light touch with an achilles reflex of +2/5 and a  negative Tinel's sign bilateral.  Musculoskeletal: Tenderness to palpation at the medial calcaneal tubercale and through the insertion of the plantar fascia on the left foot. No pain with compression of calcaneus bilateral. No pain with tuning fork to calcaneus bilateral. No pain with calf compression bilateral. There is decreased Ankle joint range of motion bilateral. All other joints range of motion within normal limits bilateral. + pes planus. Strength 5/5 in all groups bilateral.   Gait: Unassisted, Antalgic avoid weight on left/right heel  Xray, Left foot:  Normal osseous mineralization. Joint spaces preserved except at 1st MTPJ and midfoot with breach supportive of pes planus. No fracture/dislocation/boney destruction.+ Calcaneal spur present with mild thickening of plantar fascia. Ankle fracture hardware intact. No other soft tissue abnormalities or radiopaque foreign bodies.   Assessment and Plan: Problem  List Items Addressed This Visit    None    Visit Diagnoses    Plantar fasciitis of left foot    -  Primary   Relevant Medications   triamcinolone acetonide (KENALOG) 10 MG/ML injection 10 mg   Other Relevant Orders   DG Foot Complete Left   Inflammatory heel pain, left       Relevant Medications   triamcinolone acetonide (KENALOG) 10 MG/ML injection 10 mg     -Complete examination performed.  -Xrays reviewed -Discussed with patient in detail the condition of plantar fasciitis, how this occurs and general treatment options. Explained both conservative and surgical treatments.  -After oral consent and aseptic prep, injected a mixture containing 1 ml of 2%  plain lidocaine, 1 ml 0.5% plain marcaine, 0.5 ml of kenalog 10 and 0.5 ml of dexamethasone phosphate into left heel. Post-injection care discussed with patient.  -Recommended good supportive shoes and advised use of OTC insert. Dispensed felt heel lifts.  -  Explained and dispensed to patient daily stretching exercises. -Recommend patient to ice affected area 1-2x daily. -Patient to return to office as needed or in 4 weeks for follow up or sooner if problems or questions arise.  Phillip Butler, DPM

## 2017-10-06 NOTE — Patient Instructions (Signed)

## 2017-10-26 ENCOUNTER — Other Ambulatory Visit: Payer: Self-pay | Admitting: Internal Medicine

## 2017-11-24 IMAGING — CR DG ANKLE COMPLETE 3+V*L*
3 series · 3 of 3 positions shown · non-contrast
Comparison: None.

CLINICAL DATA: Slipped and fell in the back yard today. Left foot
and ankle pain.

EXAM:
LEFT ANKLE COMPLETE - 3+ VIEW

[x ankle lat left]
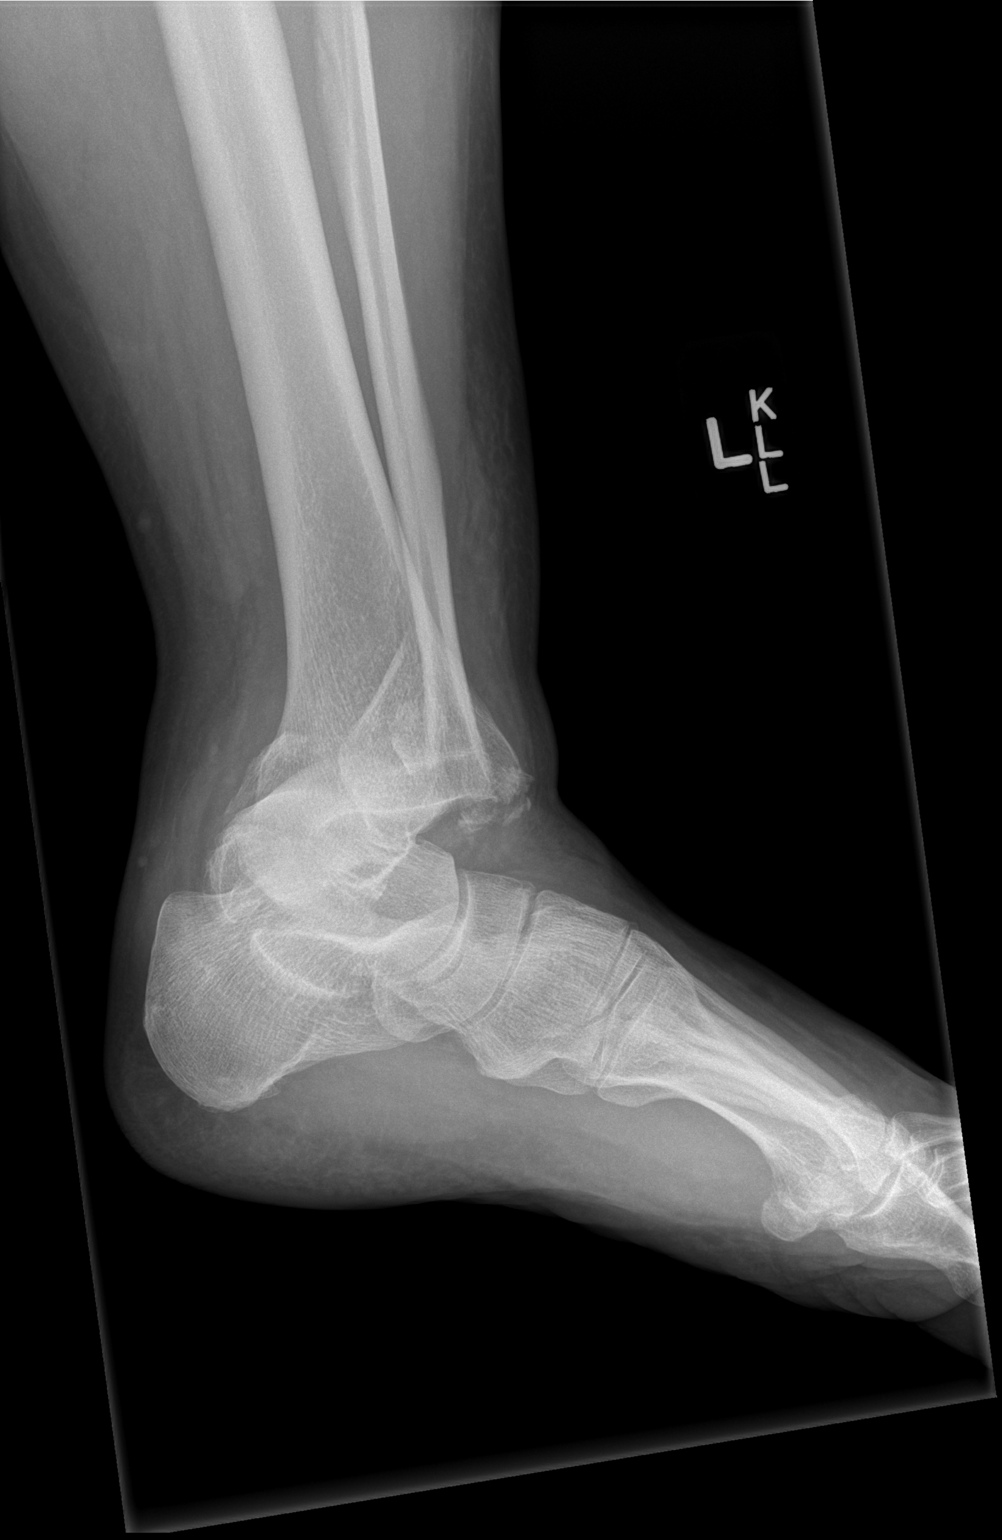

[x ankle ap left]
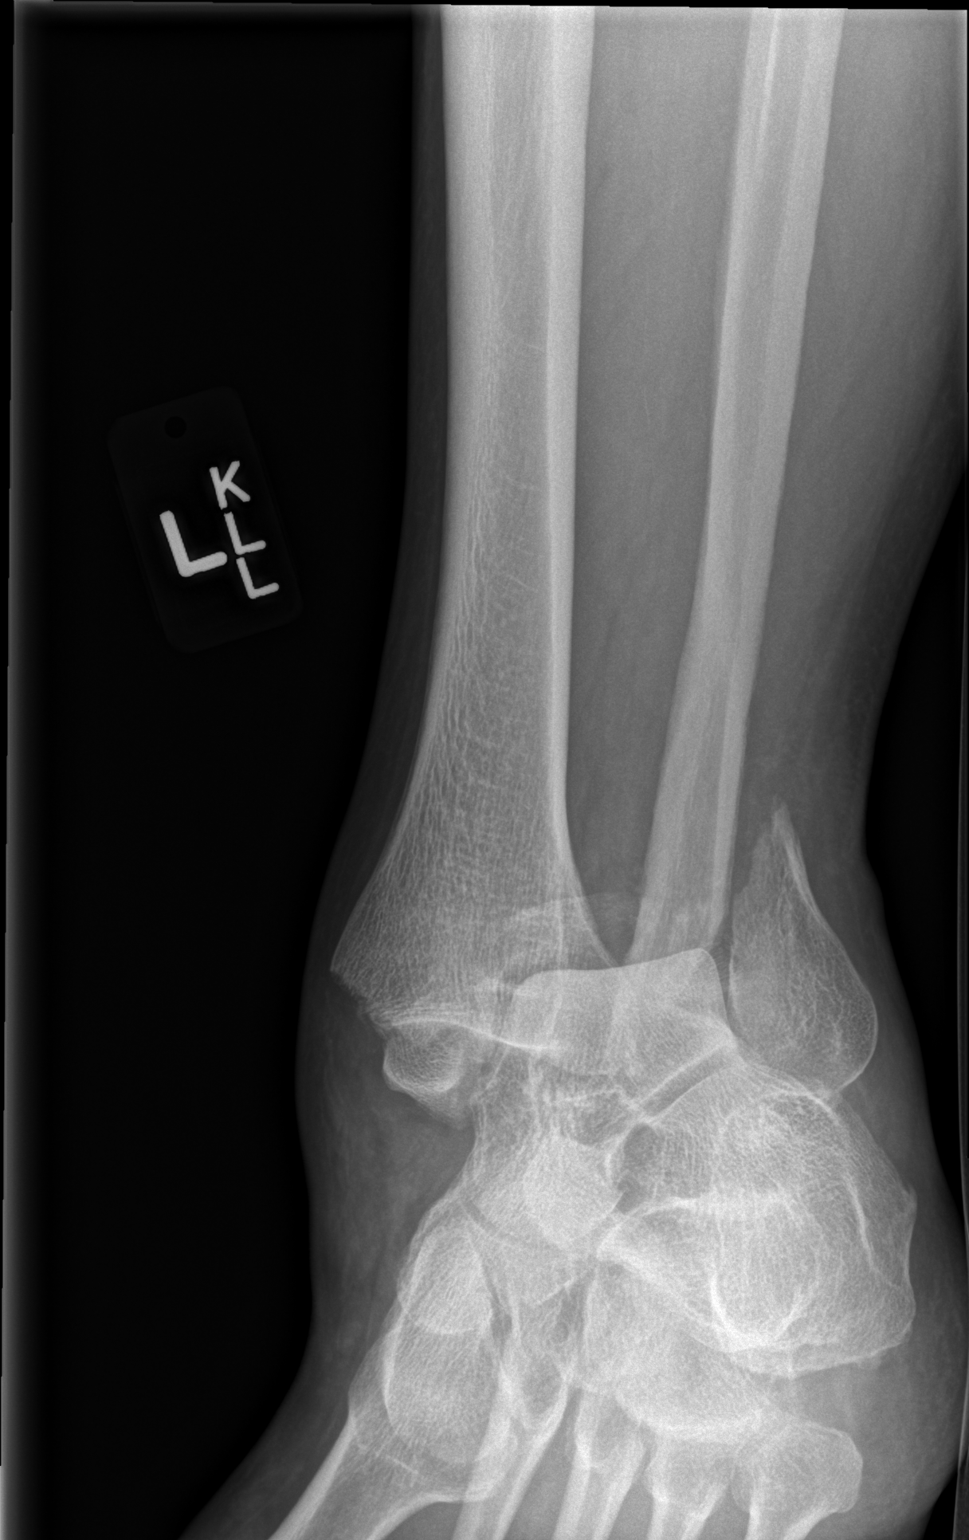

[x ankle obl left]
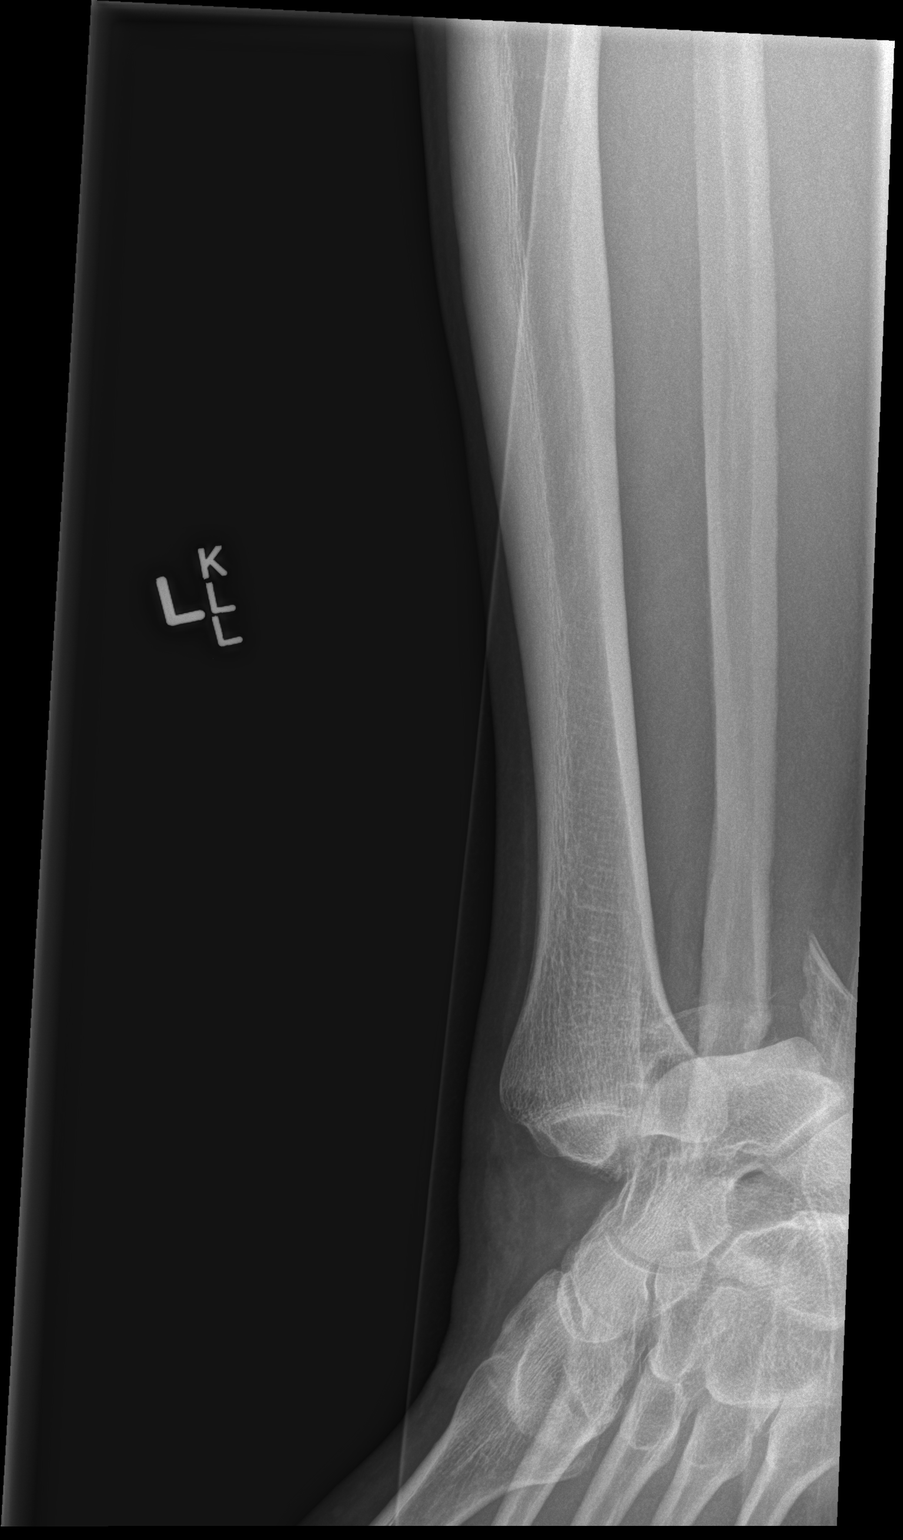

[3 of 3 positions shown; findings below may reference images not displayed]

FINDINGS: There is a trimalleolar fracture dislocation. The talar dome is
dislocated laterally and posteriorly relative to the tibial plafond.
There is a displaced distal fibular metaphysis fracture with 11 mm
of lateral displacement. There is a displaced and rotated medial
malleolar fracture with approximately 10 mm of displacement. There
is severe surrounding soft tissue swelling.
IMPRESSION: Trimalleolar left ankle fracture and dislocation.

## 2017-11-28 IMAGING — CR DG CHEST 1V PORT
1 series · 1 of 1 positions shown · non-contrast
Comparison: Chest radiograph performed 03/03/2014

CLINICAL DATA: Acute onset of respiratory difficulty. Initial
encounter.

EXAM:
PORTABLE CHEST 1 VIEW

[AP]
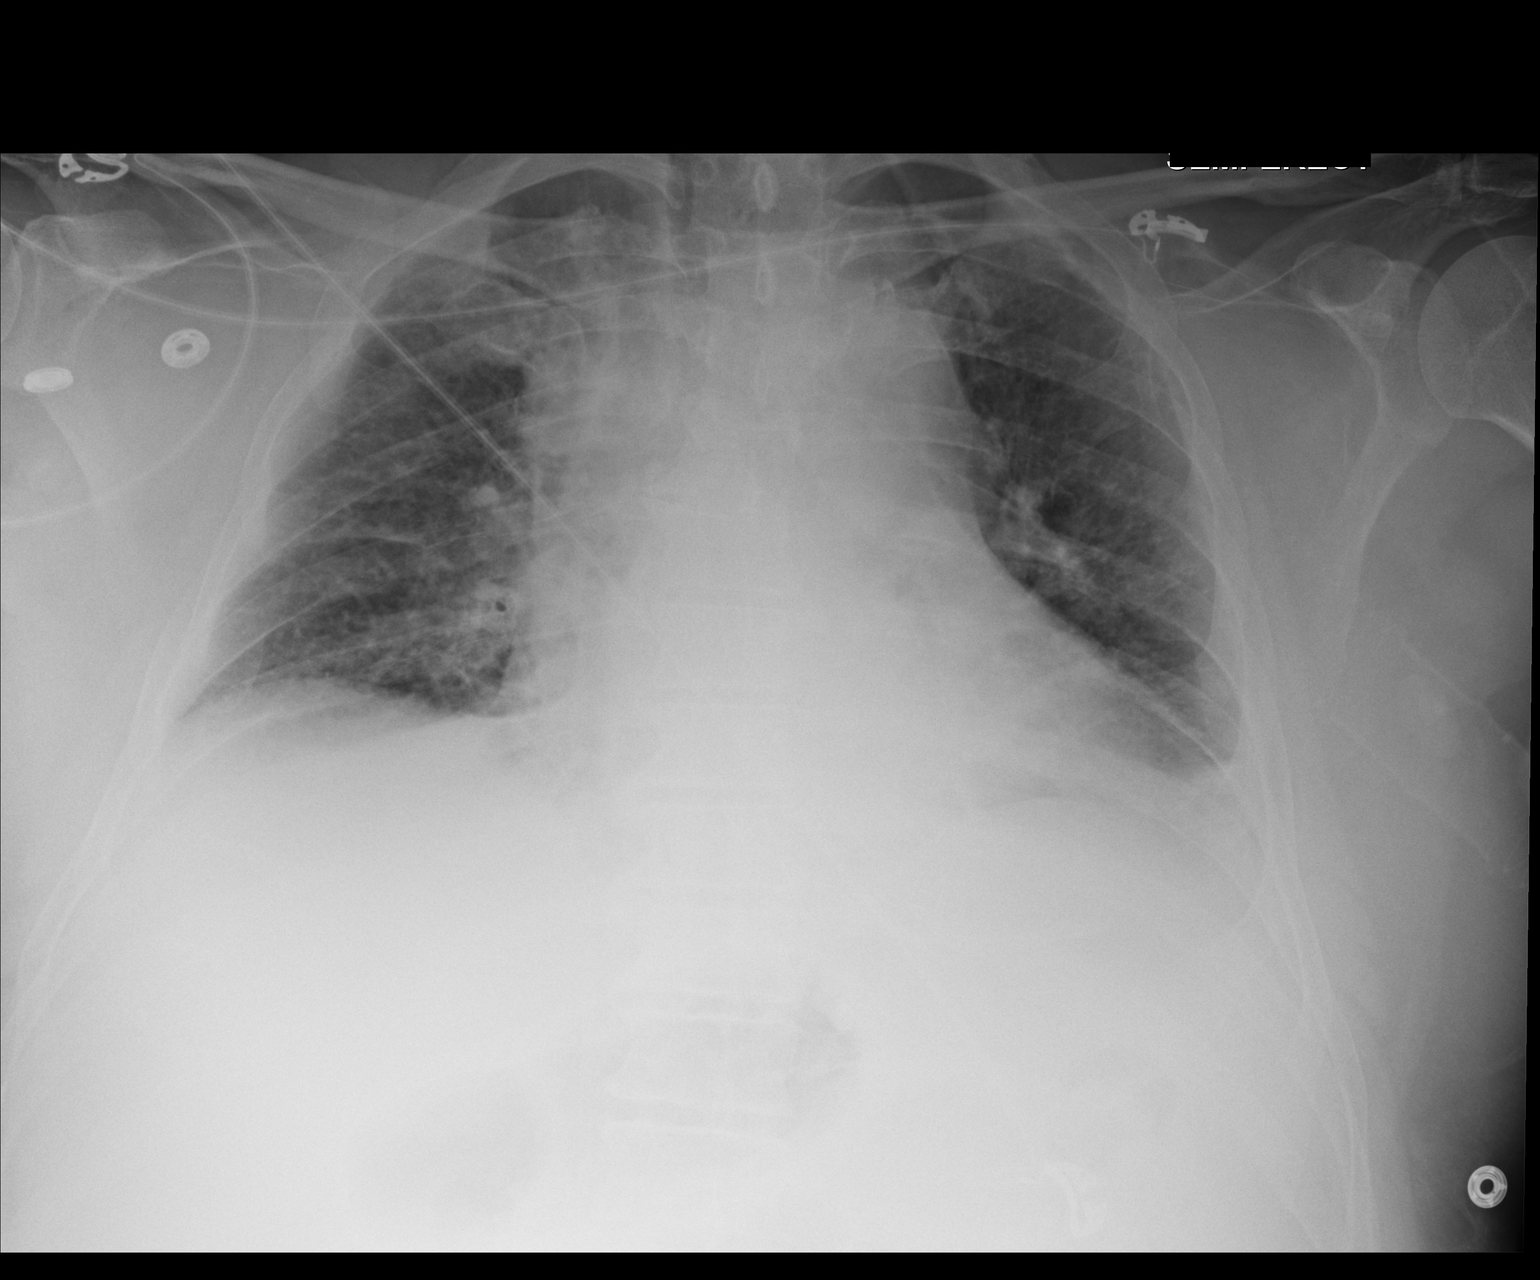

[1 of 1 positions shown; findings below may reference images not displayed]

FINDINGS: The lungs are well-aerated. Mild bibasilar opacities may reflect
mild pneumonia. No definite pleural effusion or pneumothorax is
seen.

The cardiomediastinal silhouette is mildly enlarged. No acute
osseous abnormalities are seen.
IMPRESSION: Mild bibasilar opacities may reflect mild pneumonia, given the
patient's symptoms. Mild cardiomegaly noted.

## 2017-12-12 ENCOUNTER — Encounter: Payer: Self-pay | Admitting: Internal Medicine

## 2017-12-12 ENCOUNTER — Ambulatory Visit (INDEPENDENT_AMBULATORY_CARE_PROVIDER_SITE_OTHER): Payer: Medicare Other | Admitting: Internal Medicine

## 2017-12-12 VITALS — BP 122/84 | HR 94 | Temp 98.0°F | Ht 70.0 in | Wt 314.0 lb

## 2017-12-12 DIAGNOSIS — F418 Other specified anxiety disorders: Secondary | ICD-10-CM | POA: Diagnosis not present

## 2017-12-12 DIAGNOSIS — M722 Plantar fascial fibromatosis: Secondary | ICD-10-CM

## 2017-12-12 DIAGNOSIS — H9193 Unspecified hearing loss, bilateral: Secondary | ICD-10-CM

## 2017-12-12 DIAGNOSIS — H6123 Impacted cerumen, bilateral: Secondary | ICD-10-CM

## 2017-12-12 DIAGNOSIS — R39198 Other difficulties with micturition: Secondary | ICD-10-CM | POA: Diagnosis not present

## 2017-12-12 MED ORDER — TAMSULOSIN HCL 0.4 MG PO CAPS: 0.4000 mg | ORAL_CAPSULE | Freq: Every day | ORAL | 3 refills | Status: DC

## 2017-12-12 MED ORDER — SERTRALINE HCL 100 MG PO TABS: 200.0000 mg | ORAL_TABLET | Freq: Every day | ORAL | 3 refills | Status: DC

## 2017-12-12 NOTE — Progress Notes (Signed)
Subjective:    Patient ID: Phillip Ohmharles G Trampe Jr., male    DOB: 12/08/1960, 57 y.o.   MRN: [id number]  HPI  Here with several concerns, c/o worsening depression despite good compliance with meds, Denies worsening suicidal ideation or HI, or panic; has ongoing anxiety as well.   C/o decreased urinary stream and somewhat difficult to start but Denies urinary symptoms such as dysuria, frequency, urgency, flank pain, hematuria or n/v, fever, chills.  Pt denies chest pain, increased sob or doe, wheezing, orthopnea, PND, increased LE swelling, palpitations, dizziness or syncope.  Pt denies new neurological symptoms such as new headache, or facial or extremity weakness or numbness   Pt denies polydipsia, polyuria.  Also has bilat hearing worse in the past wk - ? Wax.  Also has a localized pain and tender to the plantar left foot without trauma or overlying skin change  Also asks for refill hemorrhoid cream Wt Readings from Last 3 Encounters:  12/12/17 (!) 314 lb (142.4 kg)  09/23/17 298 lb (135.2 kg)  07/31/17 297 lb 12.8 oz (135.1 kg)   Past Medical History:  Diagnosis Date  . Allergic rhinitis   . Anemia of chronic disease   . Anxiety   . Autism   . 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 Depression   . GERD (gastroesophageal reflux disease)   . Hyperlipidemia   . Hypertension   . OSA (obstructive sleep apnea)    Past Surgical History:  Procedure Laterality Date  . ORIF ANKLE FRACTURE Left 03/02/2015   Procedure: OPEN REDUCTION INTERNAL FIXATION (ORIF) LEFT ANKLE TRIMALLEOLAR FRACTURE;  Surgeon: Toni ArthursJohn Hewitt, MD;  Location: [facility] OR;  Service: Orthopedics;  Laterality: Left;    reports that he has never smoked. He has never used smokeless tobacco. He reports that he does not drink alcohol or use drugs. family history includes Alcohol abuse in his unknown relative; Arthritis in his unknown relative; Breast cancer in his unknown relative; Diabetes in his unknown relative; Heart disease in his father and  unknown relative; Hyperlipidemia in his unknown relative; Kidney disease in his unknown relative; Pancreatic cancer in his unknown relative; Prostate cancer in his unknown relative; Stroke in his unknown relative. Allergies  Allergen Reactions  . Fluoxetine Hcl     REACTION: ineffective  \ Current Outpatient Medications on File Prior to Visit  Medication Sig Dispense Refill  . atorvastatin (LIPITOR) 40 MG tablet Take 1 tablet (40 mg total) by mouth daily. 90 tablet 3  . azelastine (OPTIVAR) 0.05 % ophthalmic solution Place 1 drop into both eyes 2 (two) times daily. 6 mL 12  . buPROPion (WELLBUTRIN XL) 150 MG 24 hr tablet Take 1 tablet (150 mg total) by mouth daily. 90 tablet 3  . carvedilol (COREG) 6.25 MG tablet Take 1 tablet (6.25 mg total) by mouth 2 (two) times daily with a meal. 60 tablet 5  . docusate sodium (COLACE) 100 MG capsule Take 1 capsule (100 mg total) by mouth 2 (two) times daily. While taking narcotic pain medicine. 30 capsule 0  . ELIQUIS 5 MG TABS tablet TAKE 1 TABLET BY MOUTH TWICE A DAY 60 tablet 11  . fesoterodine (TOVIAZ) 4 MG TB24 tablet Take 1 tablet (4 mg total) by mouth daily. 90 tablet 0  . furosemide (LASIX) 40 MG tablet Take 1 tablet (40 mg total) by mouth 2 (two) times daily. 60 tablet 11  . gabapentin (NEURONTIN) 300 MG capsule TAKE 1 CAPSULE (300 MG TOTAL) BY MOUTH AT BEDTIME. 90 capsule  1  . hydrocortisone 1 % lotion Apply 1 application topically 2 (two) times daily. 118 mL 0  . lisinopril (PRINIVIL,ZESTRIL) 20 MG tablet TAKE 1 TABLET (20 MG TOTAL) BY MOUTH DAILY. 90 tablet 3  . omeprazole (PRILOSEC) 20 MG capsule TAKE 1 CAPSULE (20 MG TOTAL) BY MOUTH DAILY. 90 capsule 3  . oxybutynin (DITROPAN-XL) 5 MG 24 hr tablet Take 1 tablet (5 mg total) by mouth at bedtime. 90 tablet 3  . oxyCODONE (ROXICODONE) 5 MG immediate release tablet Take 1-2 tablets (5-10 mg total) by mouth every 4 (four) hours as needed for moderate pain or severe pain. 30 tablet 0  . predniSONE  (DELTASONE) 10 MG tablet 3 tabs by mouth per day for 3 days,2tabs per day for 3 days,1tab per day for 3 days 18 tablet 0  . rivaroxaban (XARELTO) 20 MG TABS tablet Take 20 mg by mouth daily with supper.    . sulfamethoxazole-trimethoprim (BACTRIM DS,SEPTRA DS) 800-160 MG tablet Take 1 tablet by mouth 2 (two) times daily. 14 tablet 0  . traMADol (ULTRAM) 50 MG tablet Take 1 tablet (50 mg total) by mouth every 8 (eight) hours as needed. 30 tablet 0  . traZODone (DESYREL) 50 MG tablet Take 1 tablet (50 mg total) by mouth at bedtime. Overdue for yearly physical must see Md for refills 30 tablet 0  . Vitamin D, Ergocalciferol, (DRISDOL) 50000 units CAPS capsule TAKE ONE CAPSULE BY MOUTH EVERY 7 DAYS 8 capsule 0  . fexofenadine (ALLEGRA) 180 MG tablet Take 1 tablet (180 mg total) by mouth daily. 30 tablet 5   Current Facility-Administered Medications on File Prior to Visit  Medication Dose Route Frequency Provider Last Rate Last Dose  . triamcinolone acetonide (KENALOG) 10 MG/ML injection 10 mg  10 mg Other Once Asencion Islam, DPM       Review of Systems  Constitutional: Negative for other unusual diaphoresis or sweats HENT: Negative for ear discharge or swelling Eyes: Negative for other worsening visual disturbances Respiratory: Negative for stridor or other swelling  Gastrointestinal: Negative for worsening distension or other blood Genitourinary: Negative for retention or other urinary change Musculoskeletal: Negative for other MSK pain or swelling Skin: Negative for color change or other new lesions Neurological: Negative for worsening tremors and other numbness  Psychiatric/Behavioral: Negative for worsening agitation or other fatigue All other system neg per pt    Objective:   Physical Exam BP 122/84   Pulse 94   Temp 98 F (36.7 C) (Oral)   Ht 5\' 10"  (1.778 m)   Wt (!) 314 lb (142.4 kg)   SpO2 99%   BMI 45.05 kg/m  VS noted,  Constitutional: Pt appears in NAD HENT: Head:  NCAT.  Right Ear: External ear normal.  Left Ear: External ear normal.  Eyes: . Pupils are equal, round, and reactive to light. Conjunctivae and EOM are normal Bilat canals wax impaction cleared by irrigation Nose: without d/c or deformity Neck: Neck supple. Gross normal ROM Cardiovascular: Normal rate and regular rhythm.   Pulmonary/Chest: Effort normal and breath sounds without rales or wheezing.  Abd:  Soft, NT, ND, + BS, no organomegaly Neurological: Pt is alert. At baseline orientation, motor grossly intact Skin: Skin is warm. No rashes, other new lesions, no LE edema, left plantar foot with tender sub abou 5 mm knot to mid foot distal to the arch about 3rd tendon Psychiatric: Pt behavior is normal without agitation , + depressed affect and mood No other exam findings  Lab  Results  Component Value Date   WBC 6.8 02/26/2017   HGB 12.9 (L) 02/26/2017   HCT 39.2 02/26/2017   PLT 310.0 02/26/2017   GLUCOSE 89 09/23/2017   CHOL 220 (H) 09/23/2017   TRIG 176.0 (H) 09/23/2017   HDL 41.40 09/23/2017   LDLDIRECT 121.0 02/26/2017   LDLCALC 143 (H) 09/23/2017   ALT 23 09/23/2017   AST 23 09/23/2017   NA 138 09/23/2017   K 3.8 09/23/2017   CL 103 09/23/2017   CREATININE 1.33 09/23/2017   BUN 24 (H) 09/23/2017   CO2 25 09/23/2017   TSH 2.00 02/26/2017   PSA 0.32 02/26/2017   INR 1.05 03/02/2015   HGBA1C 6.1 09/23/2017       Assessment & Plan:

## 2017-12-12 NOTE — Patient Instructions (Signed)
Ok to increase the zoloft to 200 mg per day (2 of the 100 mg pills)  Please take all new medication as prescribed - the generic Flomax for the prostate, and the Hemorrhoid cream as needed  Please continue all other medications as before, and refills have been done if requested.  Please have the pharmacy call with any other refills you may need.  Please continue your efforts at being more active, low cholesterol diet, and weight control.  You are otherwise up to date with prevention measures today.  Please keep your appointments with your specialists as you may have planned  You will be contacted regarding the referral for: Counseling, and the [facility] (podiatry)  Your ears were irrigated of wax today  No further lab work needed today

## 2017-12-13 DIAGNOSIS — R39198 Other difficulties with micturition: Secondary | ICD-10-CM | POA: Insufficient documentation

## 2017-12-13 NOTE — Assessment & Plan Note (Addendum)
For podiatry referral  Note:  Total time for pt hx, exam, review of record with pt in the room, determination of diagnoses and plan for further eval and tx is > 40 min, with over 50% spent in coordination and counseling of patient including the differential dx, tx, further evaluation and other management of left plantar fasciitis, depression, bilat hearing loss, and urinary stream slowing

## 2017-12-13 NOTE — Assessment & Plan Note (Signed)
For flomax trial for probable worswneing BPH, consider urology referral

## 2017-12-13 NOTE — Assessment & Plan Note (Signed)
Mild worsening, declines counseling or psyhciatric referral, for increased zoloft 200 qd, cont all other tx

## 2017-12-13 NOTE — Assessment & Plan Note (Addendum)
Mild to mod, improved with irrigation of bilat wax impactions,  to f/u any worsening symptoms or concerns

## 2018-01-25 ENCOUNTER — Other Ambulatory Visit: Payer: Self-pay | Admitting: Internal Medicine

## 2018-01-28 DIAGNOSIS — Z23 Encounter for immunization: Secondary | ICD-10-CM | POA: Diagnosis not present

## 2018-03-05 ENCOUNTER — Ambulatory Visit: Payer: Medicare Other | Admitting: Psychology

## 2018-03-26 ENCOUNTER — Ambulatory Visit: Payer: Medicare Other | Admitting: Internal Medicine

## 2018-04-15 ENCOUNTER — Ambulatory Visit: Payer: Self-pay | Admitting: Internal Medicine

## 2018-05-08 ENCOUNTER — Other Ambulatory Visit: Payer: Self-pay | Admitting: Internal Medicine

## 2018-06-25 ENCOUNTER — Ambulatory Visit (INDEPENDENT_AMBULATORY_CARE_PROVIDER_SITE_OTHER): Payer: Medicare Other | Admitting: Internal Medicine

## 2018-06-25 ENCOUNTER — Encounter: Payer: Self-pay | Admitting: Internal Medicine

## 2018-06-25 ENCOUNTER — Other Ambulatory Visit: Payer: Self-pay

## 2018-06-25 ENCOUNTER — Other Ambulatory Visit (INDEPENDENT_AMBULATORY_CARE_PROVIDER_SITE_OTHER): Payer: Medicare Other

## 2018-06-25 ENCOUNTER — Other Ambulatory Visit: Payer: Self-pay | Admitting: Internal Medicine

## 2018-06-25 VITALS — BP 136/88 | HR 95 | Temp 97.9°F | Ht 70.0 in | Wt 313.0 lb

## 2018-06-25 DIAGNOSIS — I10 Essential (primary) hypertension: Secondary | ICD-10-CM

## 2018-06-25 DIAGNOSIS — E538 Deficiency of other specified B group vitamins: Secondary | ICD-10-CM | POA: Diagnosis not present

## 2018-06-25 DIAGNOSIS — R739 Hyperglycemia, unspecified: Secondary | ICD-10-CM | POA: Diagnosis not present

## 2018-06-25 DIAGNOSIS — H9192 Unspecified hearing loss, left ear: Secondary | ICD-10-CM

## 2018-06-25 DIAGNOSIS — R350 Frequency of micturition: Secondary | ICD-10-CM

## 2018-06-25 DIAGNOSIS — E611 Iron deficiency: Secondary | ICD-10-CM

## 2018-06-25 DIAGNOSIS — F418 Other specified anxiety disorders: Secondary | ICD-10-CM

## 2018-06-25 DIAGNOSIS — M79672 Pain in left foot: Secondary | ICD-10-CM

## 2018-06-25 DIAGNOSIS — E785 Hyperlipidemia, unspecified: Secondary | ICD-10-CM | POA: Diagnosis not present

## 2018-06-25 DIAGNOSIS — N320 Bladder-neck obstruction: Secondary | ICD-10-CM

## 2018-06-25 DIAGNOSIS — E559 Vitamin D deficiency, unspecified: Secondary | ICD-10-CM | POA: Diagnosis not present

## 2018-06-25 DIAGNOSIS — K921 Melena: Secondary | ICD-10-CM | POA: Diagnosis not present

## 2018-06-25 LAB — CBC WITH DIFFERENTIAL/PLATELET
Basophils Absolute: 0.1 10*3/uL (ref 0.0–0.1)
Basophils Relative: 1 % (ref 0.0–3.0)
Eosinophils Absolute: 0.4 10*3/uL (ref 0.0–0.7)
Eosinophils Relative: 5.1 % — ABNORMAL HIGH (ref 0.0–5.0)
HCT: 37.1 % — ABNORMAL LOW (ref 39.0–52.0)
Hemoglobin: 12.3 g/dL — ABNORMAL LOW (ref 13.0–17.0)
Lymphocytes Relative: 21.5 % (ref 12.0–46.0)
Lymphs Abs: 1.6 10*3/uL (ref 0.7–4.0)
MCHC: 33.1 g/dL (ref 30.0–36.0)
MCV: 89.5 fl (ref 78.0–100.0)
Monocytes Absolute: 0.7 10*3/uL (ref 0.1–1.0)
Monocytes Relative: 9.2 % (ref 3.0–12.0)
Neutro Abs: 4.7 10*3/uL (ref 1.4–7.7)
Neutrophils Relative %: 63.2 % (ref 43.0–77.0)
Platelets: 292 10*3/uL (ref 150.0–400.0)
RBC: 4.15 Mil/uL — ABNORMAL LOW (ref 4.22–5.81)
RDW: 14.2 % (ref 11.5–15.5)
WBC: 7.4 10*3/uL (ref 4.0–10.5)

## 2018-06-25 LAB — PSA: PSA: 0.32 ng/mL (ref 0.10–4.00)

## 2018-06-25 LAB — BASIC METABOLIC PANEL
BUN: 32 mg/dL — ABNORMAL HIGH (ref 6–23)
CO2: 29 mEq/L (ref 19–32)
Calcium: 9 mg/dL (ref 8.4–10.5)
Chloride: 106 mEq/L (ref 96–112)
Creatinine, Ser: 1.43 mg/dL (ref 0.40–1.50)
GFR: 50.71 mL/min — ABNORMAL LOW (ref >60.00)
Glucose, Bld: 94 mg/dL (ref 70–99)
Potassium: 4.3 mEq/L (ref 3.5–5.1)
Sodium: 142 mEq/L (ref 135–145)

## 2018-06-25 LAB — URINALYSIS, ROUTINE W REFLEX MICROSCOPIC
Bilirubin Urine: NEGATIVE
Ketones, ur: NEGATIVE
Leukocytes,Ua: NEGATIVE
Nitrite: NEGATIVE
Specific Gravity, Urine: >=1.03 — AB (ref 1.000–1.030)
Total Protein, Urine: 30 — AB
Urine Glucose: NEGATIVE
Urobilinogen, UA: 0.2 (ref 0.0–1.0)
pH: 5.5 (ref 5.0–8.0)

## 2018-06-25 LAB — HEPATIC FUNCTION PANEL
ALT: 21 U/L (ref 0–53)
AST: 20 U/L (ref 0–37)
Albumin: 4 g/dL (ref 3.5–5.2)
Alkaline Phosphatase: 121 U/L — ABNORMAL HIGH (ref 39–117)
Bilirubin, Direct: 0.1 mg/dL (ref 0.0–0.3)
Total Bilirubin: 0.4 mg/dL (ref 0.2–1.2)
Total Protein: 7.6 g/dL (ref 6.0–8.3)

## 2018-06-25 LAB — IBC PANEL
Iron: 55 ug/dL (ref 42–165)
Saturation Ratios: 15.1 % — ABNORMAL LOW (ref 20.0–50.0)
Transferrin: 260 mg/dL (ref 212.0–360.0)

## 2018-06-25 LAB — LIPID PANEL
Cholesterol: 161 mg/dL (ref 0–200)
HDL: 43.1 mg/dL (ref >39.00)
LDL Cholesterol: 91 mg/dL (ref 0–99)
NonHDL: 117.84
Total CHOL/HDL Ratio: 4
Triglycerides: 133 mg/dL (ref 0.0–149.0)
VLDL: 26.6 mg/dL (ref 0.0–40.0)

## 2018-06-25 LAB — VITAMIN D 25 HYDROXY (VIT D DEFICIENCY, FRACTURES): VITD: 16.49 ng/mL — ABNORMAL LOW (ref 30.00–100.00)

## 2018-06-25 LAB — HEMOGLOBIN A1C: Hgb A1c MFr Bld: 6.6 % — ABNORMAL HIGH (ref 4.6–6.5)

## 2018-06-25 LAB — VITAMIN B12: Vitamin B-12: 332 pg/mL (ref 211–911)

## 2018-06-25 LAB — TSH: TSH: 1.32 u[IU]/mL (ref 0.35–4.50)

## 2018-06-25 MED ORDER — VITAMIN D (ERGOCALCIFEROL) 1.25 MG (50000 UNIT) PO CAPS: ORAL_CAPSULE | ORAL | 0 refills | Status: DC

## 2018-06-25 MED ORDER — SERTRALINE HCL 100 MG PO TABS: 200.0000 mg | ORAL_TABLET | Freq: Every day | ORAL | 3 refills | Status: DC

## 2018-06-25 MED ORDER — BUPROPION HCL ER (XL) 300 MG PO TB24: 300.0000 mg | ORAL_TABLET | Freq: Every day | ORAL | 3 refills | Status: DC

## 2018-06-25 MED ORDER — ALBUTEROL SULFATE HFA 108 (90 BASE) MCG/ACT IN AERS: 2.0000 | INHALATION_SPRAY | Freq: Four times a day (QID) | RESPIRATORY_TRACT | 3 refills | Status: DC | PRN

## 2018-06-25 NOTE — Progress Notes (Signed)
Patient consent obtained. Irrigation with water and peroxide performed. Full view of tympanic membranes after procedure.  Patient tolerated procedure well.   

## 2018-06-25 NOTE — Assessment & Plan Note (Signed)
Very mild, ok for podiatry referral

## 2018-06-25 NOTE — Assessment & Plan Note (Signed)
stable overall by history and exam, recent data reviewed with pt, and pt to continue medical treatment as before,  to f/u any worsening symptoms or concerns  

## 2018-06-25 NOTE — Assessment & Plan Note (Signed)
?   Etiology, for cbc, and refer GI

## 2018-06-25 NOTE — Progress Notes (Signed)
Subjective:    Patient ID: Phillip Ohmharles G Siravo Jr., male    DOB: 05-May-1960, 58 y.o.   MRN: [id number]  HPI  Here with multiple concerns 1) sudden hearing loss for 1 wk with muffling, no pain or d/c or fever, qtip just seemed to make it worse; nothing else makes better or worse 2)  Has 1 mo onset mild tender knot area to the bottom of the left foot, kind of hurts with walking, but better with sitting, no overlying skin change or ulcer 3)  C/o worsening depressive symptoms now mod to severe, but no suicidal ideation, or panic; has ongoing anxiety as well.  Believes he may be bipolar 3)  Also with recurring small volume BRBPR intermittent mild for over 2 wks, without pain or hemorrhoid he is aware, has not had colonoscopy prior.  Denies worsening reflux, abd pain, dysphagia, n/v, bowel change 4)  Pt denies chest pain, increased sob or doe, wheezing, orthopnea, PND, increased LE swelling, palpitations, dizziness or syncope, though needs a proventil HFA inhaler refill to maintain this.   5)  Has slight increased urinary frequency with mild discomfort in the past wk, but Denies urinary symptoms such as urgency, flank pain, hematuria or n/v, fever, chills. Past Medical History:  Diagnosis Date  . Allergic rhinitis   . Anemia of chronic disease   . Anxiety   . Autism   . 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 Depression   . GERD (gastroesophageal reflux disease)   . Hyperlipidemia   . Hypertension   . OSA (obstructive sleep apnea)    Past Surgical History:  Procedure Laterality Date  . ORIF ANKLE FRACTURE Left 03/02/2015   Procedure: OPEN REDUCTION INTERNAL FIXATION (ORIF) LEFT ANKLE TRIMALLEOLAR FRACTURE;  Surgeon: Toni ArthursJohn Hewitt, MD;  Location: [facility];  Service: Orthopedics;  Laterality: Left;    reports that he has never smoked. He has never used smokeless tobacco. He reports that he does not drink alcohol or use drugs. family history includes Alcohol abuse in his unknown relative; Arthritis in his  unknown relative; Breast cancer in his unknown relative; Diabetes in his unknown relative; Heart disease in his father and unknown relative; Hyperlipidemia in his unknown relative; Kidney disease in his unknown relative; Pancreatic cancer in his unknown relative; Prostate cancer in his unknown relative; Stroke in his unknown relative. Allergies  Allergen Reactions  . Fluoxetine Hcl     REACTION: ineffective   Current Outpatient Medications on File Prior to Visit  Medication Sig Dispense Refill  . atorvastatin (LIPITOR) 40 MG tablet Take 1 tablet (40 mg total) by mouth daily. 90 tablet 3  . azelastine (OPTIVAR) 0.05 % ophthalmic solution Place 1 drop into both eyes 2 (two) times daily. 6 mL 12  . carvedilol (COREG) 6.25 MG tablet Take 1 tablet (6.25 mg total) by mouth 2 (two) times daily with a meal. 60 tablet 5  . docusate sodium (COLACE) 100 MG capsule Take 1 capsule (100 mg total) by mouth 2 (two) times daily. While taking narcotic pain medicine. 30 capsule 0  . ELIQUIS 5 MG TABS tablet TAKE 1 TABLET BY MOUTH TWICE A DAY 60 tablet 11  . fesoterodine (TOVIAZ) 4 MG TB24 tablet Take 1 tablet (4 mg total) by mouth daily. 90 tablet 0  . furosemide (LASIX) 40 MG tablet Take 1 tablet (40 mg total) by mouth 2 (two) times daily. 60 tablet 11  . gabapentin (NEURONTIN) 300 MG capsule TAKE 1 CAPSULE (300 MG TOTAL) BY MOUTH AT  BEDTIME. 90 capsule 1  . hydrocortisone 1 % lotion Apply 1 application topically 2 (two) times daily. 118 mL 0  . lisinopril (ZESTRIL) 20 MG tablet TAKE 1 TABLET (20 MG TOTAL) BY MOUTH DAILY. 90 tablet 1  . omeprazole (PRILOSEC) 20 MG capsule TAKE 1 CAPSULE (20 MG TOTAL) BY MOUTH DAILY. 90 capsule 1  . oxybutynin (DITROPAN-XL) 5 MG 24 hr tablet Take 1 tablet (5 mg total) by mouth at bedtime. 90 tablet 3  . oxyCODONE (ROXICODONE) 5 MG immediate release tablet Take 1-2 tablets (5-10 mg total) by mouth every 4 (four) hours as needed for moderate pain or severe pain. 30 tablet 0  .  predniSONE (DELTASONE) 10 MG tablet 3 tabs by mouth per day for 3 days,2tabs per day for 3 days,1tab per day for 3 days 18 tablet 0  . rivaroxaban (XARELTO) 20 MG TABS tablet Take 20 mg by mouth daily with supper.    . sulfamethoxazole-trimethoprim (BACTRIM DS,SEPTRA DS) 800-160 MG tablet Take 1 tablet by mouth 2 (two) times daily. 14 tablet 0  . tamsulosin (FLOMAX) 0.4 MG CAPS capsule Take 1 capsule (0.4 mg total) by mouth daily. 90 capsule 3  . traMADol (ULTRAM) 50 MG tablet Take 1 tablet (50 mg total) by mouth every 8 (eight) hours as needed. 30 tablet 0  . traZODone (DESYREL) 50 MG tablet Take 1 tablet (50 mg total) by mouth at bedtime. Overdue for yearly physical must see Md for refills 30 tablet 0  . Vitamin D, Ergocalciferol, (DRISDOL) 50000 units CAPS capsule TAKE ONE CAPSULE BY MOUTH EVERY 7 DAYS 8 capsule 0  . fexofenadine (ALLEGRA) 180 MG tablet Take 1 tablet (180 mg total) by mouth daily. 30 tablet 5   Current Facility-Administered Medications on File Prior to Visit  Medication Dose Route Frequency Provider Last Rate Last Dose  . triamcinolone acetonide (KENALOG) 10 MG/ML injection 10 mg  10 mg Other Once Asencion Islam, DPM       Review of Systems  Constitutional: Negative for other unusual diaphoresis or sweats HENT: Negative for ear discharge or swelling Eyes: Negative for other worsening visual disturbances Respiratory: Negative for stridor or other swelling  Gastrointestinal: Negative for worsening distension or other blood Genitourinary: Negative for retention or other urinary change Musculoskeletal: Negative for other MSK pain or swelling Skin: Negative for color change or other new lesions Neurological: Negative for worsening tremors and other numbness  Psychiatric/Behavioral: Negative for worsening agitation or other fatigue All other system neg per pt    Objective:   Physical Exam BP 136/88   Pulse 95   Temp 97.9 F (36.6 C) (Oral)   Ht 5\' 10"  (1.778 m)   Wt  (!) 313 lb (142 kg)   SpO2 100%   BMI 44.91 kg/m  VS noted, not ill appearing Constitutional: Pt appears in NAD HENT: Head: NCAT.  Right Ear: External ear normal.  Left Ear: External ear normal.  Left canal wax impaction resolved with irrigation and hearing improved Eyes: . Pupils are equal, round, and reactive to light. Conjunctivae and EOM are normal Nose: without d/c or deformity Neck: Neck supple. Gross normal ROM Cardiovascular: Normal rate and regular rhythm.   Pulmonary/Chest: Effort normal and breath sounds without rales or wheezing.  Abd:  Soft, NT, ND, + BS, no organomegaly Neurological: Pt is alert. At baseline orientation, motor grossly intact Skin: Skin is warm. No rashes, other new lesions, no LE edema, left foot has subq nodule small nontender in the first ray  likely at the tendon midfoot Psychiatric: Pt behavior is normal without agitation , + depressed/nervous affect No other exam findings Lab Results  Component Value Date   WBC 6.8 02/26/2017   HGB 12.9 (L) 02/26/2017   HCT 39.2 02/26/2017   PLT 310.0 02/26/2017   GLUCOSE 89 09/23/2017   CHOL 220 (H) 09/23/2017   TRIG 176.0 (H) 09/23/2017   HDL 41.40 09/23/2017   LDLDIRECT 121.0 02/26/2017   LDLCALC 143 (H) 09/23/2017   ALT 23 09/23/2017   AST 23 09/23/2017   NA 138 09/23/2017   K 3.8 09/23/2017   CL 103 09/23/2017   CREATININE 1.33 09/23/2017   BUN 24 (H) 09/23/2017   CO2 25 09/23/2017   TSH 2.00 02/26/2017   PSA 0.32 02/26/2017   INR 1.05 03/02/2015   HGBA1C 6.1 09/23/2017      Assessment & Plan:

## 2018-06-25 NOTE — Patient Instructions (Signed)
Your left ear was cleared of wax today  Ok to increase the Wellbutrin XL to 300 mg per day  OK to increase the Zoloft to 200 mg per day (by taking 2 of the 100 mg pills)  Please continue all other medications as before, and refills have been done if requested.  Please have the pharmacy call with any other refills you may need.  Please continue your efforts at being more active, low cholesterol diet, and weight control.  You are otherwise up to date with prevention measures today.  Please keep your appointments with your specialists as you may have planned  You will be contacted regarding the referral for: Podiatry (foot doctor), Gastroenterology (for the rectal bleeding), and Psychiatry  Please go to the LAB in the Basement (turn left off the elevator) for the tests to be done today  You will be contacted by phone if any changes need to be made immediately.  Otherwise, you will receive a letter about your results with an explanation, but please check with MyChart first.  Please remember to sign up for MyChart if you have not done so, as this will be important to you in the future with finding out test results, communicating by private email, and scheduling acute appointments online when needed.  Please return in 6 months, or sooner if needed

## 2018-06-25 NOTE — Assessment & Plan Note (Signed)
stable overall by history and exam, recent data reviewed with pt, and pt to continue medical treatment as before,  to f/u any worsening symptoms or concerns, for a1c 

## 2018-06-25 NOTE — Assessment & Plan Note (Signed)
?   Etiology - for urine studies,  to f/u any worsening symptoms or concerns

## 2018-06-25 NOTE — Assessment & Plan Note (Signed)
Resolved with irrigation. 

## 2018-06-25 NOTE — Assessment & Plan Note (Addendum)
Ok for increased wellbutrin xl 300 qd, and zoloft 200 qd, refer psychiatry  Note:  Total time for pt hx, exam, review of record with pt in the room, determination of diagnoses and plan for further eval and tx is > 40 min, with over 50% spent in coordination and counseling of patient including the differential dx, tx, further evaluation and other management of depression with anxiety, HTn, HLD, hyperglycemia, urinary frequency, hematochezia, left foot pain, left hearing loss

## 2018-06-26 ENCOUNTER — Telehealth: Payer: Self-pay

## 2018-06-26 NOTE — Telephone Encounter (Signed)
-----   Message from Corwin Levins, MD sent at 06/25/2018  6:16 PM EDT ----- Letter sent, cont same tx except    The test results show that your current treatment is OK, except the Vitamin D levels are quite low.    We should: 1) start Vitamiin D 67341 units weekly for 12 wks, then plan to change to OTC Vitamin D3 at 2000 units per day forever  Nasier Thumm to please inform pt, I will do rx

## 2018-06-26 NOTE — Telephone Encounter (Signed)
Called pt, LVM.   CRM created.  

## 2018-07-14 ENCOUNTER — Ambulatory Visit: Payer: Medicare Other | Admitting: Nurse Practitioner

## 2018-07-17 ENCOUNTER — Ambulatory Visit (INDEPENDENT_AMBULATORY_CARE_PROVIDER_SITE_OTHER): Payer: Medicare Other | Admitting: Nurse Practitioner

## 2018-07-17 ENCOUNTER — Ambulatory Visit: Payer: Medicare Other | Admitting: Nurse Practitioner

## 2018-07-17 ENCOUNTER — Encounter: Payer: Self-pay | Admitting: Nurse Practitioner

## 2018-07-17 ENCOUNTER — Telehealth: Payer: Self-pay

## 2018-07-17 VITALS — BP 126/68 | HR 89 | Temp 95.9°F | Ht 70.0 in | Wt 314.4 lb

## 2018-07-17 DIAGNOSIS — L290 Pruritus ani: Secondary | ICD-10-CM | POA: Diagnosis not present

## 2018-07-17 DIAGNOSIS — Z1211 Encounter for screening for malignant neoplasm of colon: Secondary | ICD-10-CM

## 2018-07-17 DIAGNOSIS — K625 Hemorrhage of anus and rectum: Secondary | ICD-10-CM

## 2018-07-17 MED ORDER — HYDROCORTISONE (PERIANAL) 2.5 % EX CREA: 1.0000 "application " | TOPICAL_CREAM | Freq: Every day | CUTANEOUS | 0 refills | Status: DC

## 2018-07-17 NOTE — Progress Notes (Signed)
ASSESSMENT / PLAN:   32. 58 yo male with autism here with episodic painless rectal bleeding with BMs. Bleeding probably secondary to internal hemorrhoids which may also be contributing to perianal itching.  -Trial of anusol cream. Explained how to apply cream with applicator.  -Recommended screening colonoscopy, he has never had one. He wants to discuss with caretaker. Colonoscopy brochure given. He call in a few days with condition update and to hopefully get scheduled for colonoscopy. The risks and benefits of colonoscopy with possible polypectomy / biopsies were discussed  -Eliquis would need to be held for colonoscopy.   2. GERD, asymptomatic on PPI  3. Hx of DVT, on Eliquis.    HPI:    Chief Complaint:  hemorrhoids   Patient is a 58 yo male with autism, Vitamin D deficiency, depression, HTN, hyperlipidemia, chronic diastolic heart failure, OSA, hx of LLE DVT on Eliquis, and GERD. He is referred by PCP for rectal bleeding  Patient saw PCP 06/25/18 with multiple concerns, one of which was small volume hematochezia.   He has had perianal itching for ~ one year. He hasn't tried to treat the itching or hemorrhoids. He has occasional loose stool, pepto bismul helps. Sometimes he gets constipated and strains. [place] taking just one colace but it caused severe diarrhea. Stools normal for the most part now.  Steen has never had a colonoscopy. Other than hemorrhoids Jack has no GI complaints.     Data Reviewed:  06/25/18  BUN 32, Cr 1.43, liver tests normal.  Hgb 12.3 (at baseline), MCV 89   Past Medical History:  Diagnosis Date  . Allergic rhinitis   . Anemia of chronic disease   . Anxiety   . Asthma   . Autism   . 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 Depression   . Gallstone   . GERD (gastroesophageal reflux disease)   . Hyperlipidemia   . Hypertension   . IBS (irritable bowel syndrome)   . Obesity   . OSA (obstructive sleep apnea)   . Pneumonia   .  UTI (urinary tract infection)      Past Surgical History:  Procedure Laterality Date  . ORIF ANKLE FRACTURE Left 03/02/2015   Procedure: OPEN REDUCTION INTERNAL FIXATION (ORIF) LEFT ANKLE TRIMALLEOLAR FRACTURE;  Surgeon: Wylene Simmer, MD;  Location: [facility];  Service: Orthopedics;  Laterality: Left;   Family History  Problem Relation Age of Onset  . Irritable bowel syndrome Mother   . Heart disease Father   . Alcohol abuse Other   . Arthritis Other   . Breast cancer Other   . Prostate cancer Other   . Hyperlipidemia Other   . Heart disease Other   . Stroke Other   . Kidney disease Other   . Diabetes Other   . Pancreatic cancer Other   . Colon cancer Neg Hx   . Esophageal cancer Neg Hx    Social History   Tobacco Use  . Smoking status: Never Smoker  . Smokeless tobacco: Never Used  Substance Use Topics  . Alcohol use: No  . Drug use: No   Current Outpatient Medications  Medication Sig Dispense Refill  . albuterol (VENTOLIN HFA) 108 (90 Base) MCG/ACT inhaler Inhale 2 puffs into the lungs every 6 (six) hours as needed for wheezing or shortness of breath. 3 Inhaler 3  . atorvastatin (LIPITOR) 40 MG tablet Take 1 tablet (40 mg  total) by mouth daily. 90 tablet 3  . azelastine (OPTIVAR) 0.05 % ophthalmic solution Place 1 drop into both eyes 2 (two) times daily. 6 mL 12  . carvedilol (COREG) 6.25 MG tablet Take 1 tablet (6.25 mg total) by mouth 2 (two) times daily with a meal. 60 tablet 5  . ELIQUIS 5 MG TABS tablet TAKE 1 TABLET BY MOUTH TWICE A DAY 60 tablet 11  . fexofenadine (ALLEGRA) 180 MG tablet Take 1 tablet (180 mg total) by mouth daily. 30 tablet 5  . furosemide (LASIX) 40 MG tablet Take 1 tablet (40 mg total) by mouth 2 (two) times daily. 60 tablet 11  . gabapentin (NEURONTIN) 300 MG capsule TAKE 1 CAPSULE (300 MG TOTAL) BY MOUTH AT BEDTIME. 90 capsule 1  . hydrocortisone 1 % lotion Apply 1 application topically 2 (two) times daily. 118 mL 0  . lisinopril (ZESTRIL) 20 MG  tablet TAKE 1 TABLET (20 MG TOTAL) BY MOUTH DAILY. 90 tablet 1  . omeprazole (PRILOSEC) 20 MG capsule TAKE 1 CAPSULE (20 MG TOTAL) BY MOUTH DAILY. 90 capsule 1  . oxyCODONE (ROXICODONE) 5 MG immediate release tablet Take 1-2 tablets (5-10 mg total) by mouth every 4 (four) hours as needed for moderate pain or severe pain. 30 tablet 0  . sertraline (ZOLOFT) 100 MG tablet Take 2 tablets (200 mg total) by mouth daily. 180 tablet 3  . tamsulosin (FLOMAX) 0.4 MG CAPS capsule Take 1 capsule (0.4 mg total) by mouth daily. 90 capsule 3  . traMADol (ULTRAM) 50 MG tablet Take 1 tablet (50 mg total) by mouth every 8 (eight) hours as needed. 30 tablet 0  . traZODone (DESYREL) 50 MG tablet Take 1 tablet (50 mg total) by mouth at bedtime. Overdue for yearly physical must see Md for refills 30 tablet 0  . Vitamin D, Ergocalciferol, (DRISDOL) 1.25 MG (50000 UT) CAPS capsule TAKE ONE CAPSULE BY MOUTH EVERY 7 DAYS 8 capsule 0  . buPROPion (WELLBUTRIN XL) 300 MG 24 hr tablet Take 1 tablet (300 mg total) by mouth daily. (Patient not taking: Reported on 07/17/2018) 90 tablet 3  . fesoterodine (TOVIAZ) 4 MG TB24 tablet Take 1 tablet (4 mg total) by mouth daily. (Patient not taking: Reported on 07/17/2018) 90 tablet 0  . hydrocortisone (ANUSOL-HC) 2.5 % rectal cream Place 1 application rectally at bedtime. Use for 10 days 30 g 0  . oxybutynin (DITROPAN-XL) 5 MG 24 hr tablet Take 1 tablet (5 mg total) by mouth at bedtime. (Patient not taking: Reported on 07/17/2018) 90 tablet 3   Current Facility-Administered Medications  Medication Dose Route Frequency Provider Last Rate Last Dose  . triamcinolone acetonide (KENALOG) 10 MG/ML injection 10 mg  10 mg Other Once Asencion IslamStover, Titorya, DPM       Allergies  Allergen Reactions  . Fluoxetine Hcl     REACTION: ineffective  . Xanax [Alprazolam]     Made patient too sleepy     Review of Systems: Positive for anxiety, depression, cough, headaches, hearing problems, muscle pain,  nosebleeds, SOB, sleeping problems, urine leakage. All other systems reviewed and negative except where noted in HPI.    Physical Exam:    Wt Readings from Last 3 Encounters:  07/17/18 (!) 314 lb 7 oz (142.6 kg)  06/25/18 (!) 313 lb (142 kg)  12/12/17 (!) 314 lb (142.4 kg)    BP 126/68   Pulse 89   Temp (!) 95.9 F (35.5 C) (Temporal)   Ht 5\' 10"  (1.778 m)  Wt (!) 314 lb 7 oz (142.6 kg)   BMI 45.12 kg/m  Constitutional:  Pleasant male in no acute distress. Psychiatric: Normal mood and affect. Behavior is normal. EENT: Pupils normal.  Conjunctivae are normal. No scleral icterus. Neck supple.  Cardiovascular: Normal rate, regular rhythm. No edema Pulmonary/chest: Effort normal and breath sounds normal. No wheezing, rales or rhonchi. Abdominal: Soft, nondistended, nontender. Bowel sounds active throughout. There are no masses palpable Rectal: No external hemorrhoids or lesions. No rash, erythema or other abnormalities.  Neurological: Alert and oriented to person place and time. Skin: Skin is warm and dry. No rashes noted.  Willette ClusterPaula Guenther, NP  07/17/2018, 10:38 PM  Cc: Corwin LevinsJohn, James W, MD

## 2018-07-17 NOTE — Patient Instructions (Signed)
If you are age 59 or older, your body mass index should be between 23-30. Your Body mass index is 45.12 kg/m. If this is out of the aforementioned range listed, please consider follow up with your Primary Care Provider.  If you are age 76 or younger, your body mass index should be between 19-25. Your Body mass index is 45.12 kg/m. If this is out of the aformentioned range listed, please consider follow up with your Primary Care Provider.   We have sent the following medications to your pharmacy for you to pick up at your convenience: Anusol cream  You have been given a colonoscopy brochure.  Thank you for choosing me and [facility].   Tye Savoy, NP

## 2018-07-17 NOTE — Telephone Encounter (Signed)
Covid-19 screening questions   Do you now or have you had a fever in the last 14 days? No  Do you have any respiratory symptoms of shortness of breath or cough now or in the last 14 days? Yes  Do you have any family members or close contacts with diagnosed or suspected Covid-19 in the past 14 days? No  Have you been tested for Covid-19 and found to be positive? No

## 2018-07-20 ENCOUNTER — Encounter: Payer: Self-pay | Admitting: Nurse Practitioner

## 2018-07-20 NOTE — Addendum Note (Signed)
Addended by: Justice Britain on: 07/20/2018 03:00 PM   Modules accepted: Level of Service

## 2018-07-20 NOTE — Progress Notes (Signed)
Attending Physician's Attestation   I have reviewed the chart.   I agree with the Advanced Practitioner's note, impression, and recommendations.   Rozelle Caudle Mansouraty, MD [facility] Advanced Endoscopy Office # [phone]  

## 2018-08-03 ENCOUNTER — Other Ambulatory Visit: Payer: Self-pay | Admitting: Internal Medicine

## 2018-08-07 ENCOUNTER — Other Ambulatory Visit: Payer: Self-pay | Admitting: Internal Medicine

## 2018-09-10 ENCOUNTER — Other Ambulatory Visit: Payer: Self-pay

## 2018-09-10 DIAGNOSIS — Z20822 Contact with and (suspected) exposure to covid-19: Secondary | ICD-10-CM

## 2018-09-10 DIAGNOSIS — R6889 Other general symptoms and signs: Secondary | ICD-10-CM | POA: Diagnosis not present

## 2018-09-11 LAB — NOVEL CORONAVIRUS, NAA: SARS-CoV-2, NAA: NOT DETECTED

## 2018-09-22 ENCOUNTER — Ambulatory Visit: Payer: Medicare Other | Admitting: Internal Medicine

## 2018-09-29 ENCOUNTER — Other Ambulatory Visit: Payer: Self-pay

## 2018-09-29 ENCOUNTER — Ambulatory Visit (INDEPENDENT_AMBULATORY_CARE_PROVIDER_SITE_OTHER): Payer: Medicare Other | Admitting: Internal Medicine

## 2018-09-29 ENCOUNTER — Encounter: Payer: Self-pay | Admitting: Internal Medicine

## 2018-09-29 VITALS — BP 140/78 | HR 87 | Temp 98.4°F | Wt 319.9 lb

## 2018-09-29 DIAGNOSIS — R39198 Other difficulties with micturition: Secondary | ICD-10-CM

## 2018-09-29 DIAGNOSIS — R198 Other specified symptoms and signs involving the digestive system and abdomen: Secondary | ICD-10-CM | POA: Diagnosis not present

## 2018-09-29 DIAGNOSIS — R739 Hyperglycemia, unspecified: Secondary | ICD-10-CM | POA: Diagnosis not present

## 2018-09-29 DIAGNOSIS — G2581 Restless legs syndrome: Secondary | ICD-10-CM

## 2018-09-29 DIAGNOSIS — R21 Rash and other nonspecific skin eruption: Secondary | ICD-10-CM

## 2018-09-29 DIAGNOSIS — Z23 Encounter for immunization: Secondary | ICD-10-CM | POA: Diagnosis not present

## 2018-09-29 DIAGNOSIS — I10 Essential (primary) hypertension: Secondary | ICD-10-CM

## 2018-09-29 MED ORDER — TRIAMCINOLONE ACETONIDE 0.1 % EX CREA: 1.0000 "application " | TOPICAL_CREAM | Freq: Two times a day (BID) | CUTANEOUS | 1 refills | Status: DC

## 2018-09-29 MED ORDER — GABAPENTIN 300 MG PO CAPS: 600.0000 mg | ORAL_CAPSULE | Freq: Every day | ORAL | 1 refills | Status: DC

## 2018-09-29 NOTE — Assessment & Plan Note (Signed)
stable overall by history and exam, recent data reviewed with pt, and pt to continue medical treatment as before,  to f/u any worsening symptoms or concerns  

## 2018-09-29 NOTE — Assessment & Plan Note (Signed)
[place] for increased gabapentin 600 qhs prn

## 2018-09-29 NOTE — Assessment & Plan Note (Signed)
For steroid cream prn, to f/u any worsening symptoms or concerns 

## 2018-09-29 NOTE — Patient Instructions (Addendum)
You had the flu shot today  Please take all new medication as prescribed - the steroid cream for the arm rashes  OK to increase the gabapentin to 600 mg at bedtime  OK to use OTC Benadryl to the ear itching  You will be contacted regarding the referral for: Abdomen ultrasound to check if you have fluid in the abdomen, and Urology for the urinating issue  Please continue all other medications as before, and refills have been done if requested.  Please have the pharmacy call with any other refills you may need.  Please continue your efforts at being more active, low cholesterol diet, and weight control.  Please keep your appointments with your specialists as you may have planned

## 2018-09-29 NOTE — Progress Notes (Signed)
Subjective:    Patient ID: Phillip Carwin., male    DOB: [birth year], 59 y.o.   MRN: [id number]  HPI  Here to f/u with thoughtfully making a list of concerns, c/o increased wt associated with increased abd size gradually worsening gradually x 2 mo.  Denies worsening reflux, abd pain, dysphagia, n/v, bowel change or blood.  Does have itchy scaly rash to both [facility] areas each elbow, right > left , somewhat scaly and itchy, mo pain or blisters.   Wt Readings from Last 3 Encounters:  09/29/18 (!) 319 lb 14.4 oz (145.1 kg)  07/17/18 (!) 314 lb 7 oz (142.6 kg)  06/25/18 (!) 313 lb (142 kg)   BP Readings from Last 3 Encounters:  09/29/18 140/78  07/17/18 126/68  06/25/18 136/88  Also with persistent leg shaking to go to bed despite good compliance with gabapentin.  Pt denies chest pain, increased sob or doe, wheezing, orthopnea, PND, increased LE swelling, palpitations, dizziness or syncope.  Pt denies new neurological symptoms such as new headache, or facial or extremity weakness or numbness   Pt denies polydipsia, polyuria. But c/o urinary slowing just not improved with  Past Medical History:  Diagnosis Date  . Allergic rhinitis   . Anemia of chronic disease   . Anxiety   . Asthma   . Autism   . 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 Depression   . Gallstone   . GERD (gastroesophageal reflux disease)   . Hyperlipidemia   . Hypertension   . IBS (irritable bowel syndrome)   . Obesity   . OSA (obstructive sleep apnea)   . Pneumonia   . UTI (urinary tract infection)    Past Surgical History:  Procedure Laterality Date  . ORIF ANKLE FRACTURE Left 03/02/2015   Procedure: OPEN REDUCTION INTERNAL FIXATION (ORIF) LEFT ANKLE TRIMALLEOLAR FRACTURE;  Surgeon: Wylene Simmer, MD;  Location: [place];  Service: Orthopedics;  Laterality: Left;    reports that he has never smoked. He has never used smokeless tobacco. He reports that he does not drink alcohol or use drugs. family history includes Alcohol  abuse in an other family member; Arthritis in an other family member; Breast cancer in an other family member; Diabetes in an other family member; Heart disease in his father and another family member; Hyperlipidemia in an other family member; Irritable bowel syndrome in his mother; Kidney disease in an other family member; Pancreatic cancer in an other family member; Prostate cancer in an other family member; Stroke in an other family member. Allergies  Allergen Reactions  . Fluoxetine Hcl     REACTION: ineffective  . Xanax [Alprazolam]     Made patient too sleepy   Current Outpatient Medications on File Prior to Visit  Medication Sig Dispense Refill  . albuterol (VENTOLIN HFA) 108 (90 Base) MCG/ACT inhaler Inhale 2 puffs into the lungs every 6 (six) hours as needed for wheezing or shortness of breath. 3 Inhaler 3  . atorvastatin (LIPITOR) 40 MG tablet Take 1 tablet (40 mg total) by mouth daily. 90 tablet 3  . azelastine (OPTIVAR) 0.05 % ophthalmic solution Place 1 drop into both eyes 2 (two) times daily. 6 mL 12  . buPROPion (WELLBUTRIN XL) 300 MG 24 hr tablet Take 1 tablet (300 mg total) by mouth daily. 90 tablet 3  . carvedilol (COREG) 6.25 MG tablet Take 1 tablet (6.25 mg total) by mouth 2 (two) times daily with a meal. 60 tablet 5  . ELIQUIS 5  MG TABS tablet TAKE 1 TABLET BY MOUTH TWICE A DAY 60 tablet 11  . fesoterodine (TOVIAZ) 4 MG TB24 tablet Take 1 tablet (4 mg total) by mouth daily. 90 tablet 0  . furosemide (LASIX) 40 MG tablet Take 1 tablet (40 mg total) by mouth 2 (two) times daily. 60 tablet 11  . hydrocortisone (ANUSOL-HC) 2.5 % rectal cream Place 1 application rectally at bedtime. Use for 10 days 30 g 0  . hydrocortisone 1 % lotion Apply 1 application topically 2 (two) times daily. 118 mL 0  . lisinopril (ZESTRIL) 20 MG tablet TAKE 1 TABLET (20 MG TOTAL) BY MOUTH DAILY. 90 tablet 1  . omeprazole (PRILOSEC) 20 MG capsule TAKE 1 CAPSULE (20 MG TOTAL) BY MOUTH DAILY. 90 capsule 1   . oxybutynin (DITROPAN-XL) 5 MG 24 hr tablet Take 1 tablet (5 mg total) by mouth at bedtime. 90 tablet 3  . oxyCODONE (ROXICODONE) 5 MG immediate release tablet Take 1-2 tablets (5-10 mg total) by mouth every 4 (four) hours as needed for moderate pain or severe pain. 30 tablet 0  . rivaroxaban (XARELTO) 20 MG TABS tablet Take 20 mg by mouth daily with supper.    . sertraline (ZOLOFT) 100 MG tablet TAKE 1 TABLET BY MOUTH EVERY DAY 90 tablet 0  . tamsulosin (FLOMAX) 0.4 MG CAPS capsule Take 1 capsule (0.4 mg total) by mouth daily. 90 capsule 3  . traMADol (ULTRAM) 50 MG tablet Take 1 tablet (50 mg total) by mouth every 8 (eight) hours as needed. 30 tablet 0  . traZODone (DESYREL) 50 MG tablet Take 1 tablet (50 mg total) by mouth at bedtime. Overdue for yearly physical must see Md for refills 30 tablet 0  . Vitamin D, Ergocalciferol, (DRISDOL) 1.25 MG (50000 UT) CAPS capsule TAKE ONE CAPSULE BY MOUTH EVERY 7 DAYS 8 capsule 0  . fexofenadine (ALLEGRA) 180 MG tablet Take 1 tablet (180 mg total) by mouth daily. 30 tablet 5   Current Facility-Administered Medications on File Prior to Visit  Medication Dose Route Frequency Provider Last Rate Last Dose  . triamcinolone acetonide (KENALOG) 10 MG/ML injection 10 mg  10 mg Other Once Asencion IslamStover, Titorya, DPM       Review of Systems  Constitutional: Negative for other unusual diaphoresis or sweats HENT: Negative for ear discharge or swelling Eyes: Negative for other worsening visual disturbances Respiratory: Negative for stridor or other swelling  Gastrointestinal: Negative for worsening distension or other blood Genitourinary: Negative for retention or other urinary change Musculoskeletal: Negative for other MSK pain or swelling Skin: Negative for color change or other new lesions Neurological: Negative for worsening tremors and other numbness  Psychiatric/Behavioral: Negative for worsening agitation or other fatigue All other system neg per pt     Objective:   Physical Exam BP 140/78 (BP Location: Left Arm)   Pulse 87   Temp 98.4 F (36.9 C) (Oral)   Wt (!) 319 lb 14.4 oz (145.1 kg)   SpO2 93%   BMI 45.90 kg/m  VS noted,  Constitutional: Pt appears in NAD HENT: Head: NCAT.  Right Ear: External ear normal.  Left Ear: External ear normal.  Eyes: . Pupils are equal, round, and reactive to light. Conjunctivae and EOM are normal Nose: without d/c or deformity Neck: Neck supple. Gross normal ROM Cardiovascular: Normal rate and regular rhythm.   Pulmonary/Chest: Effort normal and breath sounds without rales or wheezing.  Abd:  Soft, NT, ND, + BS, no organomegaly but w uite large  and ? fliud wave Neurological: Pt is alert. At baseline orientation, motor grossly intact Skin: Skin is warm. + scaly non tender scaly rash to bialt AC areas of the UEs without swelling, no other new lesions, no LE edema Psychiatric: Pt behavior is normal without agitation  No other exam findings  Lab Results  Component Value Date   WBC 7.4 06/25/2018   HGB 12.3 (L) 06/25/2018   HCT 37.1 (L) 06/25/2018   PLT 292.0 06/25/2018   GLUCOSE 94 06/25/2018   CHOL 161 06/25/2018   TRIG 133.0 06/25/2018   HDL 43.10 06/25/2018   LDLDIRECT 121.0 02/26/2017   LDLCALC 91 06/25/2018   ALT 21 06/25/2018   AST 20 06/25/2018   NA 142 06/25/2018   K 4.3 06/25/2018   CL 106 06/25/2018   CREATININE 1.43 06/25/2018   BUN 32 (H) 06/25/2018   CO2 29 06/25/2018   TSH 1.32 06/25/2018   PSA 0.32 06/25/2018   INR 1.05 03/02/2015   HGBA1C 6.6 (H) 06/25/2018       Assessment & Plan:

## 2018-09-29 NOTE — Assessment & Plan Note (Signed)
Also for urology referral as flomax not helping it seems

## 2018-09-29 NOTE — Assessment & Plan Note (Addendum)
?   Fat weight vs ne onset ascites - for abd u/s  Note:  Total time for pt hx, exam, review of record with pt in the room, determination of diagnoses and plan for further eval and tx is > 40 min, with over 50% spent in coordination and counseling of patient including the differential dx, tx, further evaluation and other management of increased abd girth, hyperglycemia, htn, urinary stream slowing, RLS, rash

## 2018-10-12 ENCOUNTER — Encounter: Payer: Self-pay | Admitting: Internal Medicine

## 2018-10-26 ENCOUNTER — Other Ambulatory Visit: Payer: Self-pay | Admitting: Internal Medicine

## 2018-11-02 ENCOUNTER — Other Ambulatory Visit: Payer: Self-pay | Admitting: Internal Medicine

## 2018-11-02 ENCOUNTER — Telehealth: Payer: Self-pay | Admitting: Internal Medicine

## 2018-11-02 ENCOUNTER — Encounter: Payer: Self-pay | Admitting: Internal Medicine

## 2018-11-02 ENCOUNTER — Ambulatory Visit
Admission: RE | Admit: 2018-11-02 | Discharge: 2018-11-02 | Disposition: A | Discharge status: Home / Self Care | Payer: Medicare Other | Source: Ambulatory Visit | Attending: Internal Medicine | Admitting: Internal Medicine

## 2018-11-02 DIAGNOSIS — R198 Other specified symptoms and signs involving the digestive system and abdomen: Secondary | ICD-10-CM

## 2018-11-02 DIAGNOSIS — R140 Abdominal distension (gaseous): Secondary | ICD-10-CM | POA: Diagnosis not present

## 2018-11-05 ENCOUNTER — Other Ambulatory Visit: Payer: Self-pay | Admitting: Internal Medicine

## 2019-01-06 ENCOUNTER — Ambulatory Visit (INDEPENDENT_AMBULATORY_CARE_PROVIDER_SITE_OTHER): Payer: Medicare Other | Admitting: Internal Medicine

## 2019-01-06 ENCOUNTER — Encounter: Payer: Self-pay | Admitting: Internal Medicine

## 2019-01-06 DIAGNOSIS — R05 Cough: Secondary | ICD-10-CM | POA: Diagnosis not present

## 2019-01-06 DIAGNOSIS — Z20822 Contact with and (suspected) exposure to covid-19: Secondary | ICD-10-CM

## 2019-01-06 DIAGNOSIS — Z20828 Contact with and (suspected) exposure to other viral communicable diseases: Secondary | ICD-10-CM | POA: Diagnosis not present

## 2019-01-06 DIAGNOSIS — I10 Essential (primary) hypertension: Secondary | ICD-10-CM

## 2019-01-06 DIAGNOSIS — I5032 Chronic diastolic (congestive) heart failure: Secondary | ICD-10-CM | POA: Diagnosis not present

## 2019-01-06 DIAGNOSIS — R059 Cough, unspecified: Secondary | ICD-10-CM | POA: Insufficient documentation

## 2019-01-06 MED ORDER — HYDROCODONE-HOMATROPINE 5-1.5 MG/5ML PO SYRP: 5.0000 mL | ORAL_SOLUTION | Freq: Four times a day (QID) | ORAL | 0 refills | Status: AC | PRN

## 2019-01-06 MED ORDER — AZITHROMYCIN 250 MG PO TABS: ORAL_TABLET | ORAL | 1 refills | Status: DC

## 2019-01-06 NOTE — Progress Notes (Signed)
Patient ID: Phillip Carwin., male   DOB: 1960/11/07, 58 y.o.   MRN: [id number]  Phone visit  Cumulative time during 7-day interval 11 min there was not an associated office visit for this concern within a 7 day period.  Verbal consent for services obtained from patient prior to services given.  Names of all persons present for services: Cathlean Cower, MD, patient  Chief complaint: cough x 1 wk  History, background, results pertinent:  Here with c/o 1 wk onset cough and fever with  ST, HA, general weakness and malaise, with prod cough greenish sputum, but Pt denies chest pain, increased sob or doe, wheezing, orthopnea, PND, increased LE swelling, palpitations, dizziness or syncope.  No known covid exposure.  Pt denies new neurological symptoms such as new headache, or facial or extremity weakness or numbness   Pt denies polydipsia, polyuria  Past Medical History:  Diagnosis Date  . Allergic rhinitis   . Anemia of chronic disease   . Anxiety   . Asthma   . Autism   . 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 Depression   . Gallstone   . GERD (gastroesophageal reflux disease)   . Hyperlipidemia   . Hypertension   . IBS (irritable bowel syndrome)   . Obesity   . OSA (obstructive sleep apnea)   . Pneumonia   . UTI (urinary tract infection)    No results found for this or any previous visit (from the past 48 hour(s)).  Lab Results  Component Value Date   WBC 7.4 06/25/2018   HGB 12.3 (L) 06/25/2018   HCT 37.1 (L) 06/25/2018   PLT 292.0 06/25/2018   GLUCOSE 94 06/25/2018   CHOL 161 06/25/2018   TRIG 133.0 06/25/2018   HDL 43.10 06/25/2018   LDLDIRECT 121.0 02/26/2017   LDLCALC 91 06/25/2018   ALT 21 06/25/2018   AST 20 06/25/2018   NA 142 06/25/2018   K 4.3 06/25/2018   CL 106 06/25/2018   CREATININE 1.43 06/25/2018   BUN 32 (H) 06/25/2018   CO2 29 06/25/2018   TSH 1.32 06/25/2018   PSA 0.32 06/25/2018   INR 1.05 03/02/2015   HGBA1C 6.6 (H) 06/25/2018     A/P/next  steps:  1)  Cough - .Mild to mod, c/w bronchitis vs pna, for antibx course, cough med prn, for COVID testing, consider cxr if neg, to f/u any worsening symptoms or concerns  If you have a patient who does not have access to a smart phone or PC, you or the patient can call [phone] to get assistance.  Cathlean Cower MD

## 2019-01-06 NOTE — Assessment & Plan Note (Signed)
Encouraged to f/u BP at home and next visit, goal < 140/90 

## 2019-01-06 NOTE — Patient Instructions (Signed)
Please take all new medication as prescribed - the antibiotic, and cough medicine as needed  Please go for the COVID testing  Please continue all other medications as before, and refills have been done if requested.  Please have the pharmacy call with any other refills you may need.  Please keep your appointments with your specialists as you may have planned

## 2019-01-06 NOTE — Assessment & Plan Note (Signed)
See notes

## 2019-01-06 NOTE — Assessment & Plan Note (Signed)
stable overall by history and exam, recent data reviewed with pt, and pt to continue medical treatment as before,  to f/u any worsening symptoms or concerns  

## 2019-01-08 ENCOUNTER — Other Ambulatory Visit: Payer: Self-pay | Admitting: Internal Medicine

## 2019-01-17 ENCOUNTER — Other Ambulatory Visit: Payer: Self-pay | Admitting: Internal Medicine

## 2019-01-20 ENCOUNTER — Telehealth: Payer: Self-pay

## 2019-01-20 NOTE — Telephone Encounter (Signed)
Copied from [place] [id number]. Topic: General - Inquiry >> Jan 20, 2019  4:10 PM Mathis Bud wrote: Reason for CRM: Patient brother in law and patient called to request a medical chair to help him sit up and down.  Patient weights almost 400 pounds. Call back [phone]

## 2019-01-22 ENCOUNTER — Other Ambulatory Visit: Payer: Self-pay | Admitting: Internal Medicine

## 2019-01-25 NOTE — Telephone Encounter (Signed)
Pt is obese at about 320 lbs (not 400)  This is severe but is not a reason for a lazy boy type lift chair, which is normally for very elderly patients, or those with a severe orthopedic issue like back or knee pain that cannot be treated otherwise

## 2019-01-25 NOTE — Telephone Encounter (Signed)
Please advise 

## 2019-01-26 ENCOUNTER — Encounter: Payer: Self-pay | Admitting: Internal Medicine

## 2019-01-26 NOTE — Telephone Encounter (Signed)
Called patient's brother in law and explain the message.  No further questions

## 2019-02-09 ENCOUNTER — Encounter: Payer: Self-pay | Admitting: Internal Medicine

## 2019-02-09 ENCOUNTER — Other Ambulatory Visit: Payer: Self-pay

## 2019-02-09 ENCOUNTER — Ambulatory Visit (INDEPENDENT_AMBULATORY_CARE_PROVIDER_SITE_OTHER): Payer: Medicare Other

## 2019-02-09 ENCOUNTER — Ambulatory Visit (INDEPENDENT_AMBULATORY_CARE_PROVIDER_SITE_OTHER): Payer: Medicare Other | Admitting: Internal Medicine

## 2019-02-09 VITALS — BP 182/80 | HR 100 | Temp 98.1°F | Ht 70.0 in | Wt 329.0 lb

## 2019-02-09 DIAGNOSIS — I10 Essential (primary) hypertension: Secondary | ICD-10-CM

## 2019-02-09 DIAGNOSIS — N320 Bladder-neck obstruction: Secondary | ICD-10-CM

## 2019-02-09 DIAGNOSIS — L84 Corns and callosities: Secondary | ICD-10-CM | POA: Diagnosis not present

## 2019-02-09 DIAGNOSIS — R739 Hyperglycemia, unspecified: Secondary | ICD-10-CM

## 2019-02-09 DIAGNOSIS — J309 Allergic rhinitis, unspecified: Secondary | ICD-10-CM

## 2019-02-09 DIAGNOSIS — E611 Iron deficiency: Secondary | ICD-10-CM

## 2019-02-09 DIAGNOSIS — F418 Other specified anxiety disorders: Secondary | ICD-10-CM | POA: Diagnosis not present

## 2019-02-09 DIAGNOSIS — R05 Cough: Secondary | ICD-10-CM

## 2019-02-09 DIAGNOSIS — R059 Cough, unspecified: Secondary | ICD-10-CM

## 2019-02-09 DIAGNOSIS — H9193 Unspecified hearing loss, bilateral: Secondary | ICD-10-CM | POA: Diagnosis not present

## 2019-02-09 DIAGNOSIS — E538 Deficiency of other specified B group vitamins: Secondary | ICD-10-CM

## 2019-02-09 DIAGNOSIS — R509 Fever, unspecified: Secondary | ICD-10-CM | POA: Diagnosis not present

## 2019-02-09 DIAGNOSIS — R39198 Other difficulties with micturition: Secondary | ICD-10-CM | POA: Diagnosis not present

## 2019-02-09 DIAGNOSIS — R221 Localized swelling, mass and lump, neck: Secondary | ICD-10-CM | POA: Diagnosis not present

## 2019-02-09 DIAGNOSIS — E785 Hyperlipidemia, unspecified: Secondary | ICD-10-CM

## 2019-02-09 DIAGNOSIS — E559 Vitamin D deficiency, unspecified: Secondary | ICD-10-CM

## 2019-02-09 MED ORDER — FEXOFENADINE HCL 180 MG PO TABS: 180.0000 mg | ORAL_TABLET | Freq: Every day | ORAL | 3 refills | Status: DC

## 2019-02-09 MED ORDER — TAMSULOSIN HCL 0.4 MG PO CAPS: 0.4000 mg | ORAL_CAPSULE | Freq: Two times a day (BID) | ORAL | 3 refills | Status: DC

## 2019-02-09 NOTE — Assessment & Plan Note (Signed)
Ok for increased flomax bid, refer urology

## 2019-02-09 NOTE — Assessment & Plan Note (Signed)
stable overall by history and exam, recent data reviewed with pt, and pt to continue medical treatment as before,  to f/u any worsening symptoms or concerns  

## 2019-02-09 NOTE — Progress Notes (Signed)
Subjective:    Patient ID: Phillip Butler., male    DOB: 1960-06-26, 59 y.o.   MRN: [id number]  HPI  Here with numerous concerns; has callous to heels now mild tender, Sensation of lump in throat for several weeks, bilateral hearing loss and wax impactions, allergies worsening recently, Pt denies chest pain, increased sob or doe, wheezing, orthopnea, PND, increased LE swelling, palpitations, dizziness or syncope.   Pt denies polydipsia, polyuria, Pt denies new neurological symptoms such as new headache, or facial or extremity weakness or numbness, worsening depression with anxiety but no SI or HI, persistent cough for unclear reasons, Denies worsening reflux, abd pain, dysphagia, n/v, bowel change or blood. And urinary stream slowing but Denies urinary symptoms such as dysuria, frequency, urgency, flank pain, hematuria or n/v, fever, chills.   Past Medical History:  Diagnosis Date  . Allergic rhinitis   . Anemia of chronic disease   . Anxiety   . Asthma   . Autism   . 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 Depression   . Gallstone   . GERD (gastroesophageal reflux disease)   . Hyperlipidemia   . Hypertension   . IBS (irritable bowel syndrome)   . Obesity   . OSA (obstructive sleep apnea)   . Pneumonia   . UTI (urinary tract infection)    Past Surgical History:  Procedure Laterality Date  . ORIF ANKLE FRACTURE Left 03/02/2015   Procedure: OPEN REDUCTION INTERNAL FIXATION (ORIF) LEFT ANKLE TRIMALLEOLAR FRACTURE;  Surgeon: Wylene Simmer, MD;  Location: [place];  Service: Orthopedics;  Laterality: Left;    reports that he has never smoked. He has never used smokeless tobacco. He reports that he does not drink alcohol or use drugs. family history includes Alcohol abuse in an other family member; Arthritis in an other family member; Breast cancer in an other family member; Diabetes in an other family member; Heart disease in his father and another family member; Hyperlipidemia in an other  family member; Irritable bowel syndrome in his mother; Kidney disease in an other family member; Pancreatic cancer in an other family member; Prostate cancer in an other family member; Stroke in an other family member. Allergies  Allergen Reactions  . Fluoxetine Hcl     REACTION: ineffective  . Xanax [Alprazolam]     Made patient too sleepy   Current Outpatient Medications on File Prior to Visit  Medication Sig Dispense Refill  . albuterol (VENTOLIN HFA) 108 (90 Base) MCG/ACT inhaler Inhale 2 puffs into the lungs every 6 (six) hours as needed for wheezing or shortness of breath. 3 Inhaler 3  . atorvastatin (LIPITOR) 40 MG tablet TAKE 1 TABLET BY MOUTH EVERY DAY 90 tablet 1  . azelastine (OPTIVAR) 0.05 % ophthalmic solution Place 1 drop into both eyes 2 (two) times daily. 6 mL 12  . buPROPion (WELLBUTRIN XL) 300 MG 24 hr tablet Take 1 tablet (300 mg total) by mouth daily. 90 tablet 3  . carvedilol (COREG) 6.25 MG tablet Take 1 tablet (6.25 mg total) by mouth 2 (two) times daily with a meal. 60 tablet 5  . fesoterodine (TOVIAZ) 4 MG TB24 tablet Take 1 tablet (4 mg total) by mouth daily. 90 tablet 0  . furosemide (LASIX) 40 MG tablet Take 1 tablet (40 mg total) by mouth 2 (two) times daily. 60 tablet 11  . gabapentin (NEURONTIN) 300 MG capsule TAKE 2 CAPSULES (600 MG TOTAL) BY MOUTH AT BEDTIME. 180 capsule 1  . hydrocortisone (ANUSOL-HC) 2.5 %  rectal cream Place 1 application rectally at bedtime. Use for 10 days 30 g 0  . hydrocortisone 1 % lotion Apply 1 application topically 2 (two) times daily. 118 mL 0  . lisinopril (ZESTRIL) 20 MG tablet TAKE 1 TABLET (20 MG TOTAL) BY MOUTH DAILY. 90 tablet 1  . omeprazole (PRILOSEC) 20 MG capsule TAKE 1 CAPSULE (20 MG TOTAL) BY MOUTH DAILY. 90 capsule 1  . oxybutynin (DITROPAN-XL) 5 MG 24 hr tablet Take 1 tablet (5 mg total) by mouth at bedtime. 90 tablet 3  . oxyCODONE (ROXICODONE) 5 MG immediate release tablet Take 1-2 tablets (5-10 mg total) by mouth  every 4 (four) hours as needed for moderate pain or severe pain. 30 tablet 0  . rivaroxaban (XARELTO) 20 MG TABS tablet Take 20 mg by mouth daily with supper.    . sertraline (ZOLOFT) 100 MG tablet TAKE 1 TABLET BY MOUTH EVERY DAY 90 tablet 3  . traMADol (ULTRAM) 50 MG tablet Take 1 tablet (50 mg total) by mouth every 8 (eight) hours as needed. 30 tablet 0  . traZODone (DESYREL) 50 MG tablet Take 1 tablet (50 mg total) by mouth at bedtime. Overdue for yearly physical must see Md for refills 30 tablet 0  . triamcinolone cream (KENALOG) 0.1 % Apply 1 application topically 2 (two) times daily. 45 g 1  . Vitamin D, Ergocalciferol, (DRISDOL) 1.25 MG (50000 UT) CAPS capsule TAKE ONE CAPSULE BY MOUTH EVERY 7 DAYS 8 capsule 0   Current Facility-Administered Medications on File Prior to Visit  Medication Dose Route Frequency Provider Last Rate Last Admin  . triamcinolone acetonide (KENALOG) 10 MG/ML injection 10 mg  10 mg Other Once Asencion Islam, DPM       Review of Systems All otherwise neg per pt     Objective:   Physical Exam BP (!) 182/80 (BP Location: Left Arm, Patient Position: Sitting, Cuff Size: Large)   Pulse 100   Temp 98.1 F (36.7 C) (Oral)   Ht 5\' 10"  (1.778 m)   Wt (!) 329 lb (149.2 kg)   SpO2 90%   BMI 47.21 kg/m  VS noted,  Constitutional: Pt appears in NAD HENT: Head: NCAT.  Right Ear: External ear normal.  Left Ear: External ear normal.  Eyes: . Pupils are equal, round, and reactive to light. Conjunctivae and EOM are normal Nose: without d/c or deformity Neck: Neck supple. Gross normal ROM Cardiovascular: Normal rate and regular rhythm.   Pulmonary/Chest: Effort normal and breath sounds without rales or wheezing.  Abd:  Soft, NT, ND, + BS, no organomegaly Neurological: Pt is alert. At baseline orientation, motor grossly intact Skin: Skin is warm. No rashes, other new lesions, no LE edema Psychiatric: Pt behavior is normal without agitation  All otherwise neg per  pt Lab Results  Component Value Date   WBC 7.4 06/25/2018   HGB 12.3 (L) 06/25/2018   HCT 37.1 (L) 06/25/2018   PLT 292.0 06/25/2018   GLUCOSE 94 06/25/2018   CHOL 161 06/25/2018   TRIG 133.0 06/25/2018   HDL 43.10 06/25/2018   LDLDIRECT 121.0 02/26/2017   LDLCALC 91 06/25/2018   ALT 21 06/25/2018   AST 20 06/25/2018   NA 142 06/25/2018   K 4.3 06/25/2018   CL 106 06/25/2018   CREATININE 1.43 06/25/2018   BUN 32 (H) 06/25/2018   CO2 29 06/25/2018   TSH 1.32 06/25/2018   PSA 0.32 06/25/2018   INR 1.05 03/02/2015   HGBA1C 6.6 (H) 06/25/2018  Assessment & Plan:

## 2019-02-09 NOTE — Assessment & Plan Note (Signed)
Improved with wax impaction irrigations 

## 2019-02-09 NOTE — Patient Instructions (Addendum)
Your ears were cleared of WAX toay  For the COVID vaccine - please call [phone] (option 2) for an appt, but if unable to get through quickly, please go to the web page:   [email or web address] to be placed on the wait list (an email and phone number are required)  For COVID testing if this needed;  If you/your practice has a patient that needs an appointment, please have the patient text "COVID" to [phone], OR they can log on to [email or web address] to easily make an on-line appointment.   If you have a patient who does not have access to a smart phone or PC, you or the patient can call [phone] to get assistance.  Ok to increase the flomax to twice per day for urination  Please continue all other medications as before, including the refill for the allegra which can help sinus congestion, cough and itching  Please have the pharmacy call with any other refills you may need.  Please continue your efforts at being more active, low cholesterol diet, and weight control.  Please keep your appointments with your specialists as you may have planned  You will be contacted regarding the referral for: ENT for the throat lump, Podiatry for the callous feet, Psychiatry for the nerves, and Urology for the urination  Please go to the XRAY Department in the first floor for the x-ray testing  Please go to the LAB at the blood drawing area for the tests to be done  You will be contacted by phone if any changes need to be made immediately.  Otherwise, you will receive a letter about your results with an explanation, but please check with MyChart first.  Please remember to sign up for MyChart if you have not done so, as this will be important to you in the future with finding out test results, communicating by private email, and scheduling acute appointments online when needed.  Please make an Appointment to return in 3 months, or sooner if needed

## 2019-02-09 NOTE — Assessment & Plan Note (Signed)
For allegra qd prn

## 2019-02-09 NOTE — Assessment & Plan Note (Signed)
Cont current meds, refer psychiatry

## 2019-02-09 NOTE — Assessment & Plan Note (Addendum)
Ok for podiatry referral  I spent 45 minutes preparing to see the patient by review of recent labs, imaging and procedures, obtaining and reviewing separately obtained history, communicating with the patient and family or caregiver, ordering medications, tests or procedures, and documenting clinical information in the EHR including the differential Dx, treatment, and any further evaluation and other management of callous, throat lump, bilateral hearing loss, allergic rhinitis, HLD, hyperglycemia, HTN, depressoin, cough, urinary flow slowing

## 2019-02-09 NOTE — Assessment & Plan Note (Signed)
Etiology unclear, for cxr 

## 2019-02-09 NOTE — Assessment & Plan Note (Signed)
stable overall by history and exam, recent data reviewed with pt, and pt to continue medical treatment as before,  to f/u any worsening symptoms or concerns le  

## 2019-02-09 NOTE — Assessment & Plan Note (Signed)
Exam ok for ENT referral

## 2019-02-10 ENCOUNTER — Ambulatory Visit: Payer: Medicare Other

## 2019-02-10 ENCOUNTER — Encounter: Payer: Self-pay | Admitting: Internal Medicine

## 2019-02-12 ENCOUNTER — Other Ambulatory Visit: Payer: Self-pay

## 2019-02-12 ENCOUNTER — Encounter (HOSPITAL_COMMUNITY): Payer: Self-pay | Admitting: Emergency Medicine

## 2019-02-12 ENCOUNTER — Emergency Department (HOSPITAL_COMMUNITY): Payer: Medicare Other

## 2019-02-12 ENCOUNTER — Ambulatory Visit (INDEPENDENT_AMBULATORY_CARE_PROVIDER_SITE_OTHER): Payer: Medicare Other | Admitting: Family Medicine

## 2019-02-12 ENCOUNTER — Inpatient Hospital Stay (HOSPITAL_COMMUNITY)
Admission: EM | Admit: 2019-02-12 | Discharge: 2019-02-17 | DRG: 291 | Disposition: A | Discharge status: Home / Self Care | Payer: Medicare Other | Attending: Internal Medicine | Admitting: Internal Medicine

## 2019-02-12 VITALS — BP 140/80 | HR 100 | Temp 98.9°F | Ht 70.0 in | Wt 334.9 lb

## 2019-02-12 DIAGNOSIS — Z823 Family history of stroke: Secondary | ICD-10-CM

## 2019-02-12 DIAGNOSIS — J9601 Acute respiratory failure with hypoxia: Secondary | ICD-10-CM

## 2019-02-12 DIAGNOSIS — I5033 Acute on chronic diastolic (congestive) heart failure: Secondary | ICD-10-CM | POA: Diagnosis present

## 2019-02-12 DIAGNOSIS — Z800 Family history of malignant neoplasm of digestive organs: Secondary | ICD-10-CM

## 2019-02-12 DIAGNOSIS — Z20822 Contact with and (suspected) exposure to covid-19: Secondary | ICD-10-CM | POA: Diagnosis present

## 2019-02-12 DIAGNOSIS — F329 Major depressive disorder, single episode, unspecified: Secondary | ICD-10-CM | POA: Diagnosis present

## 2019-02-12 DIAGNOSIS — R0902 Hypoxemia: Secondary | ICD-10-CM | POA: Diagnosis not present

## 2019-02-12 DIAGNOSIS — I10 Essential (primary) hypertension: Secondary | ICD-10-CM | POA: Diagnosis present

## 2019-02-12 DIAGNOSIS — Z6841 Body Mass Index (BMI) 40.0 and over, adult: Secondary | ICD-10-CM

## 2019-02-12 DIAGNOSIS — F419 Anxiety disorder, unspecified: Secondary | ICD-10-CM | POA: Diagnosis present

## 2019-02-12 DIAGNOSIS — J189 Pneumonia, unspecified organism: Secondary | ICD-10-CM | POA: Diagnosis present

## 2019-02-12 DIAGNOSIS — Z79899 Other long term (current) drug therapy: Secondary | ICD-10-CM

## 2019-02-12 DIAGNOSIS — R0602 Shortness of breath: Secondary | ICD-10-CM | POA: Diagnosis not present

## 2019-02-12 DIAGNOSIS — I5032 Chronic diastolic (congestive) heart failure: Secondary | ICD-10-CM

## 2019-02-12 DIAGNOSIS — F840 Autistic disorder: Secondary | ICD-10-CM | POA: Diagnosis present

## 2019-02-12 DIAGNOSIS — Z7982 Long term (current) use of aspirin: Secondary | ICD-10-CM

## 2019-02-12 DIAGNOSIS — T502X5A Adverse effect of carbonic-anhydrase inhibitors, benzothiadiazides and other diuretics, initial encounter: Secondary | ICD-10-CM | POA: Diagnosis not present

## 2019-02-12 DIAGNOSIS — D638 Anemia in other chronic diseases classified elsewhere: Secondary | ICD-10-CM | POA: Diagnosis present

## 2019-02-12 DIAGNOSIS — J45909 Unspecified asthma, uncomplicated: Secondary | ICD-10-CM | POA: Diagnosis present

## 2019-02-12 DIAGNOSIS — Z9119 Patient's noncompliance with other medical treatment and regimen: Secondary | ICD-10-CM

## 2019-02-12 DIAGNOSIS — G4733 Obstructive sleep apnea (adult) (pediatric): Secondary | ICD-10-CM | POA: Diagnosis present

## 2019-02-12 DIAGNOSIS — K219 Gastro-esophageal reflux disease without esophagitis: Secondary | ICD-10-CM | POA: Diagnosis present

## 2019-02-12 DIAGNOSIS — Z888 Allergy status to other drugs, medicaments and biological substances status: Secondary | ICD-10-CM

## 2019-02-12 DIAGNOSIS — N179 Acute kidney failure, unspecified: Secondary | ICD-10-CM | POA: Diagnosis not present

## 2019-02-12 DIAGNOSIS — I110 Hypertensive heart disease with heart failure: Secondary | ICD-10-CM | POA: Diagnosis not present

## 2019-02-12 DIAGNOSIS — Z8249 Family history of ischemic heart disease and other diseases of the circulatory system: Secondary | ICD-10-CM

## 2019-02-12 DIAGNOSIS — Z833 Family history of diabetes mellitus: Secondary | ICD-10-CM

## 2019-02-12 DIAGNOSIS — E669 Obesity, unspecified: Secondary | ICD-10-CM | POA: Diagnosis present

## 2019-02-12 DIAGNOSIS — Z86718 Personal history of other venous thrombosis and embolism: Secondary | ICD-10-CM

## 2019-02-12 DIAGNOSIS — E785 Hyperlipidemia, unspecified: Secondary | ICD-10-CM | POA: Diagnosis present

## 2019-02-12 DIAGNOSIS — R000 Tachycardia, unspecified: Secondary | ICD-10-CM | POA: Diagnosis not present

## 2019-02-12 DIAGNOSIS — Z8042 Family history of malignant neoplasm of prostate: Secondary | ICD-10-CM

## 2019-02-12 DIAGNOSIS — Z7901 Long term (current) use of anticoagulants: Secondary | ICD-10-CM

## 2019-02-12 DIAGNOSIS — Y92239 Unspecified place in hospital as the place of occurrence of the external cause: Secondary | ICD-10-CM | POA: Diagnosis not present

## 2019-02-12 LAB — CBC WITH DIFFERENTIAL/PLATELET
Abs Immature Granulocytes: 0.03 10*3/uL (ref 0.00–0.07)
Basophils Absolute: 0 10*3/uL (ref 0.0–0.1)
Basophils Relative: 1 %
Eosinophils Absolute: 0.2 10*3/uL (ref 0.0–0.5)
Eosinophils Relative: 5 %
HCT: 44.1 % (ref 39.0–52.0)
Hemoglobin: 13.4 g/dL (ref 13.0–17.0)
Immature Granulocytes: 1 %
Lymphocytes Relative: 21 %
Lymphs Abs: 0.9 10*3/uL (ref 0.7–4.0)
MCH: 29 pg (ref 26.0–34.0)
MCHC: 30.4 g/dL (ref 30.0–36.0)
MCV: 95.5 fL (ref 80.0–100.0)
Monocytes Absolute: 0.5 10*3/uL (ref 0.1–1.0)
Monocytes Relative: 11 %
Neutro Abs: 2.8 10*3/uL (ref 1.7–7.7)
Neutrophils Relative %: 61 %
Platelets: 183 10*3/uL (ref 150–400)
RBC: 4.62 MIL/uL (ref 4.22–5.81)
RDW: 14 % (ref 11.5–15.5)
WBC: 4.4 10*3/uL (ref 4.0–10.5)
nRBC: 0 % (ref 0.0–0.2)

## 2019-02-12 LAB — COMPREHENSIVE METABOLIC PANEL
ALT: 27 U/L (ref 0–44)
AST: 31 U/L (ref 15–41)
Albumin: 3.6 g/dL (ref 3.5–5.0)
Alkaline Phosphatase: 99 U/L (ref 38–126)
Anion gap: 7 (ref 5–15)
BUN: 27 mg/dL — ABNORMAL HIGH (ref 6–20)
CO2: 28 mmol/L (ref 22–32)
Calcium: 8.4 mg/dL — ABNORMAL LOW (ref 8.9–10.3)
Chloride: 107 mmol/L (ref 98–111)
Creatinine, Ser: 1.17 mg/dL (ref 0.61–1.24)
GFR calc Af Amer: >60 mL/min (ref >60)
GFR calc non Af Amer: >60 mL/min (ref >60)
Glucose, Bld: 119 mg/dL — ABNORMAL HIGH (ref 70–99)
Potassium: 4.5 mmol/L (ref 3.5–5.1)
Sodium: 142 mmol/L (ref 135–145)
Total Bilirubin: 0.5 mg/dL (ref 0.3–1.2)
Total Protein: 7.5 g/dL (ref 6.5–8.1)

## 2019-02-12 LAB — BRAIN NATRIURETIC PEPTIDE: B Natriuretic Peptide: 285.9 pg/mL — ABNORMAL HIGH (ref 0.0–100.0)

## 2019-02-12 LAB — LACTATE DEHYDROGENASE: LDH: 243 U/L — ABNORMAL HIGH (ref 98–192)

## 2019-02-12 LAB — FERRITIN: Ferritin: 81 ng/mL (ref 24–336)

## 2019-02-12 LAB — C-REACTIVE PROTEIN: CRP: <0.5 mg/dL (ref <1.0)

## 2019-02-12 LAB — RESPIRATORY PANEL BY RT PCR (FLU A&B, COVID)
Influenza A by PCR: NEGATIVE
Influenza B by PCR: NEGATIVE
SARS Coronavirus 2 by RT PCR: NEGATIVE

## 2019-02-12 LAB — TRIGLYCERIDES: Triglycerides: 105 mg/dL (ref <150)

## 2019-02-12 LAB — D-DIMER, QUANTITATIVE: D-Dimer, Quant: 1 ug/mL-FEU — ABNORMAL HIGH (ref 0.00–0.50)

## 2019-02-12 LAB — LACTIC ACID, PLASMA
Lactic Acid, Venous: 0.8 mmol/L (ref 0.5–1.9)
Lactic Acid, Venous: 0.6 mmol/L (ref 0.5–1.9)

## 2019-02-12 LAB — FIBRINOGEN: Fibrinogen: 523 mg/dL — ABNORMAL HIGH (ref 210–475)

## 2019-02-12 LAB — PROCALCITONIN: Procalcitonin: <0.1 ng/mL

## 2019-02-12 MED ORDER — SODIUM CHLORIDE (PF) 0.9 % IJ SOLN
INTRAMUSCULAR | Status: AC
  Filled 2019-02-12: qty 50

## 2019-02-12 MED ORDER — FUROSEMIDE 10 MG/ML IJ SOLN
20.0000 mg | Freq: Once | INTRAMUSCULAR | Status: AC
  Administered 2019-02-12: 20 mg via INTRAVENOUS
  Filled 2019-02-12: qty 4

## 2019-02-12 MED ORDER — IOHEXOL 350 MG/ML SOLN
100.0000 mL | Freq: Once | INTRAVENOUS | Status: AC | PRN
  Administered 2019-02-12: 100 mL via INTRAVENOUS

## 2019-02-12 NOTE — Progress Notes (Signed)
Patient ID: Phillip Ohm., male    DOB: Aug 24, 1960, 59 y.o.   MRN: [id number]  PCP: Corwin Levins, MD  Chief Complaint  Patient presents with  . Shortness of Breath    Subjective:  HPI  Phillip Belcher. is a 59 y.o. male , presents to [facility]  Respiratory Clinic for evaluation of Pneumonia and shortness of breath.  High Risk for COVID-19: CHF, Pneumonia, Type 2  Diabetes   Patient seen by PCP on 02/08/19 and experienced symptoms of shortness of breath and cough. His PCP ordered Chest X-ray which was significant for multi-focal Pneumonia. He has not been COVID tested. He is hypoxic on arrival with oxygen levels varying between 88-93% on room air. With ambulation his oxygen dropped to 87%-88%. He was placed on 2 liters of oxygen and saturations improved to 94%. He endorses fever, productive cough, recent weight gain localized to the abdomen. He is prescribed chronic anticoagulant therapy due to patient reports "his heart". Per problem list, history significant for DVT. Denies chest pain or chest tightness. He is afebrile.  Social History   Socioeconomic History  . Marital status: Single    Spouse name: Not on file  . Number of children: Not on file  . Years of education: Not on file  . Highest education level: Not on file  Occupational History  . Not on file  Tobacco Use  . Smoking status: Never Smoker  . Smokeless tobacco: Never Used  Substance and Sexual Activity  . Alcohol use: No  . Drug use: No  . Sexual activity: Not on file  Other Topics Concern  . Not on file  Social History Narrative  . Not on file   Social Determinants of Health   Financial Resource Strain:   . Difficulty of Paying Living Expenses: Not on file  Food Insecurity:   . Worried About Programme researcher, broadcasting/film/video in the Last Year: Not on file  . Ran Out of Food in the Last Year: Not on file  Transportation Needs:   . Lack of Transportation (Medical): Not on file  . Lack of Transportation (Non-Medical):  Not on file  Physical Activity:   . Days of Exercise per Week: Not on file  . Minutes of Exercise per Session: Not on file  Stress:   . Feeling of Stress : Not on file  Social Connections:   . Frequency of Communication with Friends and Family: Not on file  . Frequency of Social Gatherings with Friends and Family: Not on file  . Attends Religious Services: Not on file  . Active Member of Clubs or Organizations: Not on file  . Attends Banker Meetings: Not on file  . Marital Status: Not on file  Intimate Partner Violence:   . Fear of Current or Ex-Partner: Not on file  . Emotionally Abused: Not on file  . Physically Abused: Not on file  . Sexually Abused: Not on file    Family History  Problem Relation Age of Onset  . Irritable bowel syndrome Mother   . Heart disease Father   . Alcohol abuse Other   . Arthritis Other   . Breast cancer Other   . Prostate cancer Other   . Hyperlipidemia Other   . Heart disease Other   . Stroke Other   . Kidney disease Other   . Diabetes Other   . Pancreatic cancer Other   . Colon cancer Neg Hx   . Esophageal cancer Neg Hx  Review of Systems Pertinent negatives listed in HPI Patient Active Problem List   Diagnosis Date Noted  . Pre-ulcerative corn or callous 02/09/2019  . Lump in throat 02/09/2019  . Cough 01/06/2019  . Increased abdominal girth 09/29/2018  . Rash 09/29/2018  . Hematochezia 06/25/2018  . Urinary stream slowing 12/13/2017  . Plantar fasciitis, left 09/23/2017  . Conjunctivitis of left eye 06/11/2017  . Left ear hearing loss 06/11/2017  . Skin lesion 06/11/2017  . Hyperglycemia 06/11/2017  . Cellulitis 07/25/2016  . 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 Right leg pain 06/20/2016  . Left ankle pain 06/13/2016  . Acute sinus infection 04/12/2016  . RLS (restless legs syndrome) 04/12/2016  . Respiratory abnormality   . Obesity (BMI 30-39.9) 03/02/2015  . Chronic diastolic heart  failure (HCC)   . Autism   . DVT, lower extremity, distal, acute (HCC)   . Closed trimalleolar fracture of left ankle 02/26/2015  . Hyperlipidemia 02/19/2014  . Depression with anxiety 02/19/2014  . Acute respiratory failure with hypoxia (HCC) 02/19/2014  . Normocytic anemia 02/19/2014  . Community acquired pneumonia 02/18/2014  . Left foot pain 02/09/2013  . Urinary frequency 02/03/2012  . Bilateral hearing loss 02/03/2012  . Preventative health care 11/28/2010  . Obstructive sleep apnea 08/14/2009  . Essential hypertension 02/16/2007  . Allergic rhinitis 02/16/2007  . GERD 02/16/2007    Allergies  Allergen Reactions  . Fluoxetine Hcl     REACTION: ineffective  . Xanax [Alprazolam]     Made patient too sleepy    Prior to Admission medications   Medication Sig Start Date End Date Taking? Authorizing Provider  albuterol (VENTOLIN HFA) 108 (90 Base) MCG/ACT inhaler Inhale 2 puffs into the lungs every 6 (six) hours as needed for wheezing or shortness of breath. 06/25/18  Yes Corwin Levins, MD  atorvastatin (LIPITOR) 40 MG tablet TAKE 1 TABLET BY MOUTH EVERY DAY 01/08/19  Yes Corwin Levins, MD  azelastine (OPTIVAR) 0.05 % ophthalmic solution Place 1 drop into both eyes 2 (two) times daily. 09/23/17  Yes Corwin Levins, MD  buPROPion (WELLBUTRIN XL) 300 MG 24 hr tablet Take 1 tablet (300 mg total) by mouth daily. 06/25/18  Yes Corwin Levins, MD  carvedilol (COREG) 6.25 MG tablet Take 1 tablet (6.25 mg total) by mouth 2 (two) times daily with a meal. 04/19/15  Yes Corwin Levins, MD  fesoterodine (TOVIAZ) 4 MG TB24 tablet Take 1 tablet (4 mg total) by mouth daily. 04/19/15  Yes Corwin Levins, MD  fexofenadine (ALLEGRA) 180 MG tablet Take 1 tablet (180 mg total) by mouth daily. 02/09/19 02/09/20 Yes Corwin Levins, MD  furosemide (LASIX) 40 MG tablet Take 1 tablet (40 mg total) by mouth 2 (two) times daily. 07/25/16  Yes Corwin Levins, MD  gabapentin (NEURONTIN) 300 MG capsule TAKE 2 CAPSULES (600 MG  TOTAL) BY MOUTH AT BEDTIME. 01/22/19  Yes Corwin Levins, MD  hydrocortisone (ANUSOL-HC) 2.5 % rectal cream Place 1 application rectally at bedtime. Use for 10 days 07/17/18  Yes Meredith Pel, NP  hydrocortisone 1 % lotion Apply 1 application topically 2 (two) times daily. 07/11/16  Yes Judi Saa, DO  lisinopril (ZESTRIL) 20 MG tablet TAKE 1 TABLET (20 MG TOTAL) BY MOUTH DAILY. 11/05/18  Yes Corwin Levins, MD  omeprazole (PRILOSEC) 20 MG capsule TAKE 1 CAPSULE (20 MG TOTAL) BY MOUTH DAILY. 10/26/18  Yes Corwin Levins, MD  oxybutynin (DITROPAN-XL) 5 MG  24 hr tablet Take 1 tablet (5 mg total) by mouth at bedtime. 06/11/17  Yes Corwin Levins, MD  oxyCODONE (ROXICODONE) 5 MG immediate release tablet Take 1-2 tablets (5-10 mg total) by mouth every 4 (four) hours as needed for moderate pain or severe pain. 04/05/15  Yes Corwin Levins, MD  rivaroxaban (XARELTO) 20 MG TABS tablet Take 20 mg by mouth daily with supper. 03/29/15  Yes [provider]  sertraline (ZOLOFT) 100 MG tablet TAKE 1 TABLET BY MOUTH EVERY DAY 01/17/19  Yes Corwin Levins, MD  tamsulosin (FLOMAX) 0.4 MG CAPS capsule Take 1 capsule (0.4 mg total) by mouth 2 (two) times daily. 02/09/19  Yes Corwin Levins, MD  traMADol (ULTRAM) 50 MG tablet Take 1 tablet (50 mg total) by mouth every 8 (eight) hours as needed. 06/20/16  Yes Corwin Levins, MD  traZODone (DESYREL) 50 MG tablet Take 1 tablet (50 mg total) by mouth at bedtime. Overdue for yearly physical must see Md for refills 09/04/15  Yes Corwin Levins, MD  triamcinolone cream (KENALOG) 0.1 % Apply 1 application topically 2 (two) times daily. 09/29/18 09/29/19 Yes Corwin Levins, MD  Vitamin D, Ergocalciferol, (DRISDOL) 1.25 MG (50000 UT) CAPS capsule TAKE ONE CAPSULE BY MOUTH EVERY 7 DAYS 06/25/18  Yes Corwin Levins, MD    Past Medical, Surgical Family and Social History reviewed and updated.    Objective:   Today's Vitals   02/12/19 1752  BP: 140/80  Pulse: 100  Temp: 98.9 F (37.2  C)  SpO2: (!) 89%  Weight: (!) 334 lb 14.4 oz (151.9 kg)  Height: 5\' 10"  (1.778 m)    Wt Readings from Last 3 Encounters:  02/12/19 (!) 334 lb 14.4 oz (151.9 kg)  02/09/19 (!) 329 lb (149.2 kg)  09/29/18 (!) 319 lb 14.4 oz (145.1 kg)     Physical Exam Constitutional:      Appearance: He is ill-appearing.  HENT:     Head: Normocephalic.  Cardiovascular:     Rate and Rhythm: Tachycardia present.  Pulmonary:     Effort: Tachypnea and accessory muscle usage present.     Breath sounds: Examination of the right-upper field reveals rales. Examination of the left-upper field reveals rales. Examination of the right-middle field reveals rales. Examination of the left-middle field reveals rales. Examination of the right-lower field reveals rales. Examination of the left-lower field reveals rales. Rales present.  Abdominal:     General: There is distension.  Musculoskeletal:     Right lower leg: Edema present.     Left lower leg: Edema present.  Neurological:     General: No focal deficit present.  Psychiatric:        Mood and Affect: Mood normal.     No results found for: POCGLU  Lab Results  Component Value Date   HGBA1C 6.6 (H) 06/25/2018        Assessment & Plan:  1. SOB (shortness of breath) Recent chest x-ray 1/19 significant for multifocal PNA. Suspicious for COVID-19. PCP referred patient here today for evaluation at Respiratory Clinic Dyspnea and hypoxic on arrival- O2 sats 87% with ambulation   O2 sats improved with 2 liters of oxygen. EMS called to transport to ER - Novel Coronavirus, NAA ([facility]); Future - Novel Coronavirus, NAA ([facility])  2. Chronic diastolic heart failure (HCC) -Recent weight gain >    4. Acute respiratory failure with hypoxia (HCC) See #1, patient transported to ER    Patient transported  via EMS to [facility] due to abnormal lung exam and acute hypoxia with persistent dyspnea.     -The patient was given clear instructions to go to ER  or return to medical center if symptoms do not improve, worsen or new problems develop. The patient verbalized understanding.      Molli Barrows, FNP-C [facility], PRN Provider  [facility]. [place], [place] Clinic Phone: [phone] Clinic Fax: [phone] Clinic Hours: 5:30 pm -7:30 pm (Monday, Wednesday, and Friday)

## 2019-02-12 NOTE — ED Provider Notes (Signed)
[facility]-EMERGENCY DEPT Provider Note   CSN: [id number] Arrival date & time: 02/12/19  1953     History Chief Complaint  Patient presents with  . Shortness of Breath    Phillip Butler. is a 59 y.o. male.  With past medical history of autism, obesity, anemia of chronic disease, hyperlipidemia, hypertension who presents the emergency department with hypoxia.  The patient has had a few days of shortness of breath.  His brother took him to [place] to be evaluated where he was found to have oxygen saturations of 87% on room air.  Patient had an x-ray that was suspicious for potential COVID-19.  Review of EMR shows that chest x-ray performed on 02/09/2019 showed multi lobar patchy antral infiltrates.  Patient has had a slight cough and has been feeling unwell for the past few days.  He is a difficult historian and does not give specific details or timelines.  He does feel somewhat short of breath. Patient states that he is sob at rest and worse with ambulation. No CP. + orthopnea and states that has been going on for a long time.  He uses a CPAP and states he has a history of sleep apnea  HPI     Past Medical History:  Diagnosis Date  . Allergic rhinitis   . Anemia of chronic disease   . Anxiety   . Asthma   . Autism   . 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 Depression   . Gallstone   . GERD (gastroesophageal reflux disease)   . Hyperlipidemia   . Hypertension   . IBS (irritable bowel syndrome)   . Obesity   . OSA (obstructive sleep apnea)   . Pneumonia   . UTI (urinary tract infection)     Patient Active Problem List   Diagnosis Date Noted  . Pre-ulcerative corn or callous 02/09/2019  . Lump in throat 02/09/2019  . Cough 01/06/2019  . Increased abdominal girth 09/29/2018  . Rash 09/29/2018  . Hematochezia 06/25/2018  . Urinary stream slowing 12/13/2017  . Plantar fasciitis, left 09/23/2017  . Conjunctivitis of left eye 06/11/2017  . Left ear  hearing loss 06/11/2017  . Skin lesion 06/11/2017  . Hyperglycemia 06/11/2017  . Cellulitis 07/25/2016  . 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 Right leg pain 06/20/2016  . Left ankle pain 06/13/2016  . Acute sinus infection 04/12/2016  . RLS (restless legs syndrome) 04/12/2016  . Respiratory abnormality   . Obesity (BMI 30-39.9) 03/02/2015  . Chronic diastolic heart failure (HCC)   . Autism   . DVT, lower extremity, distal, acute (HCC)   . Closed trimalleolar fracture of left ankle 02/26/2015  . Hyperlipidemia 02/19/2014  . Depression with anxiety 02/19/2014  . Acute respiratory failure with hypoxia (HCC) 02/19/2014  . Normocytic anemia 02/19/2014  . Community acquired pneumonia 02/18/2014  . Left foot pain 02/09/2013  . Urinary frequency 02/03/2012  . Bilateral hearing loss 02/03/2012  . Preventative health care 11/28/2010  . Obstructive sleep apnea 08/14/2009  . Essential hypertension 02/16/2007  . Allergic rhinitis 02/16/2007  . GERD 02/16/2007    Past Surgical History:  Procedure Laterality Date  . ORIF ANKLE FRACTURE Left 03/02/2015   Procedure: OPEN REDUCTION INTERNAL FIXATION (ORIF) LEFT ANKLE TRIMALLEOLAR FRACTURE;  Surgeon: Toni Arthurs, MD;  Location: [facility] OR;  Service: Orthopedics;  Laterality: Left;       Family History  Problem Relation Age of Onset  . Irritable bowel syndrome Mother   .  Heart disease Father   . Alcohol abuse Other   . Arthritis Other   . Breast cancer Other   . Prostate cancer Other   . Hyperlipidemia Other   . Heart disease Other   . Stroke Other   . Kidney disease Other   . Diabetes Other   . Pancreatic cancer Other   . Colon cancer Neg Hx   . Esophageal cancer Neg Hx     Social History   Tobacco Use  . Smoking status: Never Smoker  . Smokeless tobacco: Never Used  Substance Use Topics  . Alcohol use: No  . Drug use: No    Home Medications Prior to Admission medications   Medication Sig Start Date End Date  Taking? Authorizing Provider  albuterol (VENTOLIN HFA) 108 (90 Base) MCG/ACT inhaler Inhale 2 puffs into the lungs every 6 (six) hours as needed for wheezing or shortness of breath. 06/25/18   Biagio Borg, MD  atorvastatin (LIPITOR) 40 MG tablet TAKE 1 TABLET BY MOUTH EVERY DAY 01/08/19   Biagio Borg, MD  azelastine (OPTIVAR) 0.05 % ophthalmic solution Place 1 drop into both eyes 2 (two) times daily. 09/23/17   Biagio Borg, MD  buPROPion (WELLBUTRIN XL) 300 MG 24 hr tablet Take 1 tablet (300 mg total) by mouth daily. 06/25/18   Biagio Borg, MD  carvedilol (COREG) 6.25 MG tablet Take 1 tablet (6.25 mg total) by mouth 2 (two) times daily with a meal. 04/19/15   Biagio Borg, MD  fesoterodine (TOVIAZ) 4 MG TB24 tablet Take 1 tablet (4 mg total) by mouth daily. 04/19/15   Biagio Borg, MD  fexofenadine (ALLEGRA) 180 MG tablet Take 1 tablet (180 mg total) by mouth daily. 02/09/19 02/09/20  Biagio Borg, MD  furosemide (LASIX) 40 MG tablet Take 1 tablet (40 mg total) by mouth 2 (two) times daily. 07/25/16   Biagio Borg, MD  gabapentin (NEURONTIN) 300 MG capsule TAKE 2 CAPSULES (600 MG TOTAL) BY MOUTH AT BEDTIME. 01/22/19   Biagio Borg, MD  hydrocortisone (ANUSOL-HC) 2.5 % rectal cream Place 1 application rectally at bedtime. Use for 10 days 07/17/18   Willia Craze, NP  hydrocortisone 1 % lotion Apply 1 application topically 2 (two) times daily. 07/11/16   Lyndal Pulley, DO  lisinopril (ZESTRIL) 20 MG tablet TAKE 1 TABLET (20 MG TOTAL) BY MOUTH DAILY. 11/05/18   Biagio Borg, MD  omeprazole (PRILOSEC) 20 MG capsule TAKE 1 CAPSULE (20 MG TOTAL) BY MOUTH DAILY. 10/26/18   Biagio Borg, MD  oxybutynin (DITROPAN-XL) 5 MG 24 hr tablet Take 1 tablet (5 mg total) by mouth at bedtime. 06/11/17   Biagio Borg, MD  oxyCODONE (ROXICODONE) 5 MG immediate release tablet Take 1-2 tablets (5-10 mg total) by mouth every 4 (four) hours as needed for moderate pain or severe pain. 04/05/15   Biagio Borg, MD    rivaroxaban (XARELTO) 20 MG TABS tablet Take 20 mg by mouth daily with supper. 03/29/15   [provider]  sertraline (ZOLOFT) 100 MG tablet TAKE 1 TABLET BY MOUTH EVERY DAY 01/17/19   Biagio Borg, MD  tamsulosin (FLOMAX) 0.4 MG CAPS capsule Take 1 capsule (0.4 mg total) by mouth 2 (two) times daily. 02/09/19   Biagio Borg, MD  traMADol (ULTRAM) 50 MG tablet Take 1 tablet (50 mg total) by mouth every 8 (eight) hours as needed. 06/20/16   Biagio Borg, MD  traZODone (  DESYREL) 50 MG tablet Take 1 tablet (50 mg total) by mouth at bedtime. Overdue for yearly physical must see Md for refills 09/04/15   Corwin Levins, MD  triamcinolone cream (KENALOG) 0.1 % Apply 1 application topically 2 (two) times daily. 09/29/18 09/29/19  Corwin Levins, MD  Vitamin D, Ergocalciferol, (DRISDOL) 1.25 MG (50000 UT) CAPS capsule TAKE ONE CAPSULE BY MOUTH EVERY 7 DAYS 06/25/18   Corwin Levins, MD    Allergies    Fluoxetine hcl and Xanax [alprazolam]  Review of Systems   Review of Systems Ten systems reviewed and are negative for acute change, except as noted in the HPI.   Physical Exam Updated Vital Signs BP (!) 160/66   Pulse (!) 105   Temp 98.5 F (36.9 C) (Oral)   Resp (!) 25   Ht 5\' 10"  (1.778 m)   Wt (!) 169.6 kg   SpO2 93%   BMI 53.66 kg/m   Physical Exam Vitals and nursing note reviewed.  Constitutional:      General: He is not in acute distress.    Appearance: He is well-developed. He is obese. He is not diaphoretic.     Comments: Pickwickian body habitus   HENT:     Head: Normocephalic and atraumatic.  Eyes:     General: No scleral icterus.    Conjunctiva/sclera: Conjunctivae normal.  Cardiovascular:     Rate and Rhythm: Normal rate and regular rhythm.     Heart sounds: Normal heart sounds.  Pulmonary:     Effort: Pulmonary effort is normal. No respiratory distress.     Breath sounds: Normal breath sounds.  Abdominal:     Palpations: Abdomen is soft.     Tenderness: There is no  abdominal tenderness.  Musculoskeletal:     Cervical back: Normal range of motion and neck supple.     Right lower leg: Edema present.     Left lower leg: Edema present.     Comments: 2+  Skin:    General: Skin is warm and dry.  Neurological:     Mental Status: He is alert.  Psychiatric:        Behavior: Behavior normal.     ED Results / Procedures / Treatments   Labs (all labs ordered are listed, but only abnormal results are displayed) Labs Reviewed  COMPREHENSIVE METABOLIC PANEL - Abnormal; Notable for the following components:      Result Value   Glucose, Bld 119 (*)    BUN 27 (*)    Calcium 8.4 (*)    All other components within normal limits  D-DIMER, QUANTITATIVE (NOT AT [facility]) - Abnormal; Notable for the following components:   D-Dimer, Quant 1.00 (*)    All other components within normal limits  LACTATE DEHYDROGENASE - Abnormal; Notable for the following components:   LDH 243 (*)    All other components within normal limits  FIBRINOGEN - Abnormal; Notable for the following components:   Fibrinogen 523 (*)    All other components within normal limits  BRAIN NATRIURETIC PEPTIDE - Abnormal; Notable for the following components:   B Natriuretic Peptide 285.9 (*)    All other components within normal limits  RESPIRATORY PANEL BY RT PCR (FLU A&B, COVID)  CULTURE, BLOOD (ROUTINE X 2)  CULTURE, BLOOD (ROUTINE X 2)  LACTIC ACID, PLASMA  CBC WITH DIFFERENTIAL/PLATELET  PROCALCITONIN  FERRITIN  TRIGLYCERIDES  C-REACTIVE PROTEIN  LACTIC ACID, PLASMA    EKG EKG Interpretation  Date/Time:  Friday  February 12 2019 20:14:27 EST Ventricular Rate:  88 PR Interval:    QRS Duration: 90 QT Interval:  376 QTC Calculation: 455 R Axis:   24 Text Interpretation: Sinus rhythm Confirmed by Raeford Razor [phone]) on 02/12/2019 11:17:27 PM   Radiology DG Chest Port 1 View  Result Date: 02/12/2019 CLINICAL DATA:  59 year old male with shortness of breath. EXAM: PORTABLE CHEST  1 VIEW COMPARISON:  Chest radiograph dated 02/09/2019 FINDINGS: Ill-defined hazy density in the peripheral/subpleural aspect the left mid to lower lung field similar to prior radiograph. The right lung is clear. There is no pleural effusion or pneumothorax. There is cardiomegaly. Acute osseous pathology. IMPRESSION: Left mid to lower lung field subpleural atelectasis or infiltrate. Clinical correlation is recommended. Electronically Signed   By: Elgie Collard M.D.   On: 02/12/2019 21:15    Procedures Procedures (including critical care time)  Medications Ordered in ED Medications - No data to display  ED Course  I have reviewed the triage vital signs and the nursing notes.  Pertinent labs & imaging results that were available during my care of the patient were reviewed by me and considered in my medical decision making (see chart for details).  Clinical Course as of Feb 11 2317  Fri Feb 12, 2019  2132 DG Chest [place] 1 View [AH]  2300 DG Chest [place] 1 View [AH]    Clinical Course User Index [AH] Arthor Captain, [place]   MDM Rules/Calculators/A&P                      59 year old male with history of obesity, obstructive sleep apnea.  Seen for shortness of breath at the outpatient setting with questionable multi lobar pneumonia on x-ray.  Patient denies fevers or chills.  The emergent differential diagnosis for shortness of breath includes, but is not limited to, Pulmonary edema, bronchoconstriction, Pneumonia, Pulmonary embolism, Pneumotherax/ Hemothorax, Dysrythmia, ACS.  He certainly has some pitting edema on physical examination.  Review of the patient's labs shows negative Covid and flu test, no elevated inflammatory markers.  Patient's D-dimer and fibrinogen are ill of elevated.  His BNP is also elevated.  CMP without significant abnormality.  CBC shows no elevated white blood cell count.  Given his clinical clinical presentation I have low suspicion for pneumonia.  He is hypoxic into  the 80s and requiring at least 2 L of oxygen to maintain an oxygen saturation above 90%.  He is currently at 93%.  EKG shows normal sinus rhythm.  I have ordered a CT angiogram to rule out pulmonary embolus. Expect that this may be new onset heart failure versus obesity hypoventilation.  He does not have home oxygen.  He will need admission. Final Clinical Impression(s) / ED Diagnoses Final diagnoses:  SOB (shortness of breath)    Rx / DC Orders ED Discharge Orders    None       Arthor Captain, PA-C 02/14/19 0741    Raeford Razor, MD 02/17/19 [phone]

## 2019-02-12 NOTE — ED Triage Notes (Signed)
Pts brother took him to [place] for [facility] and sent here via EMS. Pt was originally 87% on RA. Pt has not been in any distress with EMS. Pt has hx of autism. Pts xray suspicious for COVID.

## 2019-02-13 ENCOUNTER — Encounter (HOSPITAL_COMMUNITY): Payer: Self-pay | Admitting: Internal Medicine

## 2019-02-13 ENCOUNTER — Observation Stay (HOSPITAL_BASED_OUTPATIENT_CLINIC_OR_DEPARTMENT_OTHER): Payer: Medicare Other

## 2019-02-13 ENCOUNTER — Observation Stay (HOSPITAL_COMMUNITY): Payer: Medicare Other

## 2019-02-13 DIAGNOSIS — Z888 Allergy status to other drugs, medicaments and biological substances status: Secondary | ICD-10-CM | POA: Diagnosis not present

## 2019-02-13 DIAGNOSIS — J189 Pneumonia, unspecified organism: Secondary | ICD-10-CM | POA: Diagnosis present

## 2019-02-13 DIAGNOSIS — I5031 Acute diastolic (congestive) heart failure: Secondary | ICD-10-CM

## 2019-02-13 DIAGNOSIS — J9601 Acute respiratory failure with hypoxia: Secondary | ICD-10-CM | POA: Diagnosis not present

## 2019-02-13 DIAGNOSIS — K219 Gastro-esophageal reflux disease without esophagitis: Secondary | ICD-10-CM | POA: Diagnosis present

## 2019-02-13 DIAGNOSIS — Z7982 Long term (current) use of aspirin: Secondary | ICD-10-CM | POA: Diagnosis not present

## 2019-02-13 DIAGNOSIS — Z86718 Personal history of other venous thrombosis and embolism: Secondary | ICD-10-CM

## 2019-02-13 DIAGNOSIS — G4733 Obstructive sleep apnea (adult) (pediatric): Secondary | ICD-10-CM | POA: Diagnosis not present

## 2019-02-13 DIAGNOSIS — N179 Acute kidney failure, unspecified: Secondary | ICD-10-CM | POA: Diagnosis not present

## 2019-02-13 DIAGNOSIS — Z79899 Other long term (current) drug therapy: Secondary | ICD-10-CM | POA: Diagnosis not present

## 2019-02-13 DIAGNOSIS — T502X5A Adverse effect of carbonic-anhydrase inhibitors, benzothiadiazides and other diuretics, initial encounter: Secondary | ICD-10-CM | POA: Diagnosis not present

## 2019-02-13 DIAGNOSIS — I5033 Acute on chronic diastolic (congestive) heart failure: Secondary | ICD-10-CM | POA: Diagnosis not present

## 2019-02-13 DIAGNOSIS — Z7901 Long term (current) use of anticoagulants: Secondary | ICD-10-CM | POA: Diagnosis not present

## 2019-02-13 DIAGNOSIS — R0602 Shortness of breath: Secondary | ICD-10-CM | POA: Diagnosis not present

## 2019-02-13 DIAGNOSIS — Z823 Family history of stroke: Secondary | ICD-10-CM | POA: Diagnosis not present

## 2019-02-13 DIAGNOSIS — Z6841 Body Mass Index (BMI) 40.0 and over, adult: Secondary | ICD-10-CM | POA: Diagnosis not present

## 2019-02-13 DIAGNOSIS — I10 Essential (primary) hypertension: Secondary | ICD-10-CM

## 2019-02-13 DIAGNOSIS — I110 Hypertensive heart disease with heart failure: Secondary | ICD-10-CM | POA: Diagnosis present

## 2019-02-13 DIAGNOSIS — M7989 Other specified soft tissue disorders: Secondary | ICD-10-CM | POA: Diagnosis not present

## 2019-02-13 DIAGNOSIS — E785 Hyperlipidemia, unspecified: Secondary | ICD-10-CM | POA: Diagnosis present

## 2019-02-13 DIAGNOSIS — E669 Obesity, unspecified: Secondary | ICD-10-CM

## 2019-02-13 DIAGNOSIS — Y92239 Unspecified place in hospital as the place of occurrence of the external cause: Secondary | ICD-10-CM | POA: Diagnosis not present

## 2019-02-13 DIAGNOSIS — Z800 Family history of malignant neoplasm of digestive organs: Secondary | ICD-10-CM | POA: Diagnosis not present

## 2019-02-13 DIAGNOSIS — Z20822 Contact with and (suspected) exposure to covid-19: Secondary | ICD-10-CM | POA: Diagnosis present

## 2019-02-13 DIAGNOSIS — Z9119 Patient's noncompliance with other medical treatment and regimen: Secondary | ICD-10-CM | POA: Diagnosis not present

## 2019-02-13 DIAGNOSIS — Z8249 Family history of ischemic heart disease and other diseases of the circulatory system: Secondary | ICD-10-CM | POA: Diagnosis not present

## 2019-02-13 DIAGNOSIS — Z8042 Family history of malignant neoplasm of prostate: Secondary | ICD-10-CM | POA: Diagnosis not present

## 2019-02-13 DIAGNOSIS — Z833 Family history of diabetes mellitus: Secondary | ICD-10-CM | POA: Diagnosis not present

## 2019-02-13 DIAGNOSIS — F840 Autistic disorder: Secondary | ICD-10-CM | POA: Diagnosis present

## 2019-02-13 LAB — HIV ANTIBODY (ROUTINE TESTING W REFLEX): HIV Screen 4th Generation wRfx: NONREACTIVE

## 2019-02-13 LAB — ECHOCARDIOGRAM COMPLETE
Height: 70 in
Weight: 5259.29 oz

## 2019-02-13 MED ORDER — PANTOPRAZOLE SODIUM 40 MG PO TBEC
40.0000 mg | DELAYED_RELEASE_TABLET | Freq: Every day | ORAL | Status: DC
  Administered 2019-02-13 – 2019-02-17 (×5): 40 mg via ORAL
  Filled 2019-02-13 (×5): qty 1

## 2019-02-13 MED ORDER — GABAPENTIN 300 MG PO CAPS
600.0000 mg | ORAL_CAPSULE | Freq: Every day | ORAL | Status: DC
  Administered 2019-02-13 – 2019-02-16 (×5): 600 mg via ORAL
  Filled 2019-02-13 (×5): qty 2

## 2019-02-13 MED ORDER — VITAMIN D 25 MCG (1000 UNIT) PO TABS
1000.0000 [IU] | ORAL_TABLET | Freq: Every day | ORAL | Status: DC
  Administered 2019-02-13 – 2019-02-17 (×5): 1000 [IU] via ORAL
  Filled 2019-02-13 (×5): qty 1

## 2019-02-13 MED ORDER — APIXABAN 5 MG PO TABS
5.0000 mg | ORAL_TABLET | Freq: Two times a day (BID) | ORAL | Status: DC
  Administered 2019-02-13 – 2019-02-17 (×9): 5 mg via ORAL
  Filled 2019-02-13 (×10): qty 1

## 2019-02-13 MED ORDER — ASPIRIN EC 81 MG PO TBEC
81.0000 mg | DELAYED_RELEASE_TABLET | Freq: Every day | ORAL | Status: DC
  Administered 2019-02-13 – 2019-02-17 (×5): 81 mg via ORAL
  Filled 2019-02-13 (×5): qty 1

## 2019-02-13 MED ORDER — PERFLUTREN LIPID MICROSPHERE
1.0000 mL | INTRAVENOUS | Status: AC | PRN
  Administered 2019-02-13: 8 mL via INTRAVENOUS
  Filled 2019-02-13: qty 10

## 2019-02-13 MED ORDER — ACETAMINOPHEN 325 MG PO TABS: 650.0000 mg | ORAL_TABLET | ORAL | Status: DC | PRN

## 2019-02-13 MED ORDER — VITAMIN B-12 1000 MCG PO TABS
1000.0000 ug | ORAL_TABLET | Freq: Every day | ORAL | Status: DC
  Administered 2019-02-13 – 2019-02-17 (×5): 1000 ug via ORAL
  Filled 2019-02-13 (×5): qty 1

## 2019-02-13 MED ORDER — SODIUM CHLORIDE 0.9% FLUSH: 3.0000 mL | INTRAVENOUS | Status: DC | PRN

## 2019-02-13 MED ORDER — LISINOPRIL 20 MG PO TABS
20.0000 mg | ORAL_TABLET | Freq: Every day | ORAL | Status: DC
  Administered 2019-02-13 – 2019-02-14 (×2): 20 mg via ORAL
  Filled 2019-02-13 (×2): qty 1

## 2019-02-13 MED ORDER — PHENYLEPHRINE HCL 10 MG PO TABS
10.0000 mg | ORAL_TABLET | Freq: Three times a day (TID) | ORAL | Status: DC | PRN
  Filled 2019-02-13 (×2): qty 1

## 2019-02-13 MED ORDER — SODIUM CHLORIDE 0.9 % IV SOLN: 250.0000 mL | INTRAVENOUS | Status: DC | PRN

## 2019-02-13 MED ORDER — DM-PHENYLEPHRINE-ACETAMINOPHEN 10-5-325 MG PO CAPS: 2.0000 | ORAL_CAPSULE | Freq: Three times a day (TID) | ORAL | Status: DC | PRN

## 2019-02-13 MED ORDER — ENSURE ENLIVE PO LIQD
237.0000 mL | Freq: Two times a day (BID) | ORAL | Status: DC
  Administered 2019-02-13 (×2): 237 mL via ORAL

## 2019-02-13 MED ORDER — SODIUM CHLORIDE 0.9% FLUSH
3.0000 mL | Freq: Two times a day (BID) | INTRAVENOUS | Status: DC
  Administered 2019-02-13 – 2019-02-17 (×10): 3 mL via INTRAVENOUS

## 2019-02-13 MED ORDER — ATORVASTATIN CALCIUM 40 MG PO TABS
40.0000 mg | ORAL_TABLET | Freq: Every day | ORAL | Status: DC
  Administered 2019-02-13 – 2019-02-16 (×4): 40 mg via ORAL
  Filled 2019-02-13 (×5): qty 1

## 2019-02-13 MED ORDER — ALBUTEROL SULFATE HFA 108 (90 BASE) MCG/ACT IN AERS: 2.0000 | INHALATION_SPRAY | Freq: Four times a day (QID) | RESPIRATORY_TRACT | Status: DC | PRN

## 2019-02-13 MED ORDER — ACETAMINOPHEN 325 MG PO TABS: 650.0000 mg | ORAL_TABLET | Freq: Three times a day (TID) | ORAL | Status: DC | PRN

## 2019-02-13 MED ORDER — TAMSULOSIN HCL 0.4 MG PO CAPS
0.4000 mg | ORAL_CAPSULE | Freq: Two times a day (BID) | ORAL | Status: DC
  Administered 2019-02-13 – 2019-02-17 (×9): 0.4 mg via ORAL
  Filled 2019-02-13 (×9): qty 1

## 2019-02-13 MED ORDER — POLYVINYL ALCOHOL 1.4 % OP SOLN: 1.0000 [drp] | Freq: Three times a day (TID) | OPHTHALMIC | Status: DC | PRN

## 2019-02-13 MED ORDER — ONDANSETRON HCL 4 MG/2ML IJ SOLN: 4.0000 mg | Freq: Four times a day (QID) | INTRAMUSCULAR | Status: DC | PRN

## 2019-02-13 MED ORDER — FUROSEMIDE 10 MG/ML IJ SOLN
40.0000 mg | Freq: Every day | INTRAMUSCULAR | Status: DC
  Administered 2019-02-13 – 2019-02-14 (×2): 40 mg via INTRAVENOUS
  Filled 2019-02-13 (×2): qty 4

## 2019-02-13 MED ORDER — ENOXAPARIN SODIUM 80 MG/0.8ML ~~LOC~~ SOLN: 80.0000 mg | Freq: Every day | SUBCUTANEOUS | Status: DC

## 2019-02-13 MED ORDER — SERTRALINE HCL 50 MG PO TABS
100.0000 mg | ORAL_TABLET | Freq: Every day | ORAL | Status: DC
  Administered 2019-02-13 – 2019-02-17 (×5): 100 mg via ORAL
  Filled 2019-02-13 (×5): qty 2

## 2019-02-13 MED ORDER — METOPROLOL TARTRATE 25 MG PO TABS
12.5000 mg | ORAL_TABLET | Freq: Two times a day (BID) | ORAL | Status: DC
  Administered 2019-02-13 – 2019-02-17 (×9): 12.5 mg via ORAL
  Filled 2019-02-13 (×10): qty 1

## 2019-02-13 MED ORDER — FUROSEMIDE 10 MG/ML IJ SOLN
20.0000 mg | Freq: Once | INTRAMUSCULAR | Status: AC
  Administered 2019-02-13: 20 mg via INTRAVENOUS
  Filled 2019-02-13: qty 4

## 2019-02-13 NOTE — Progress Notes (Signed)
Pt refusing to wear CPAP QHS at this time.  Pt to notify RT if he changes his mind.  RN aware, RT to monitor and assess as needed.

## 2019-02-13 NOTE — Procedures (Signed)
Echo attempted. Patient eating breakfast. Will attempt again.

## 2019-02-13 NOTE — Progress Notes (Signed)
Pt says he wears cpap at home with full face mask. He says sometimes he has trouble tolerating it. Pt is having apnea spells with desaturation while sleeping with just nasal cannula o2. Cpap with o2 bled in was set up at pts bedside. He is awake having breakfast at this time.

## 2019-02-13 NOTE — ED Provider Notes (Signed)
59 year old male pending admission at sign out from [facility] for acute congestive heart failure without history of CHF versus obesity hypoventilation syndrome as patient was found to be hypoxic in the ER today with new oxygen requirement..  Please see her note for further work-up and medical decision making.  Dr. Julian Reil has accepted the patient for admission. The patient appears reasonably stabilized for admission considering the current resources, flow, and capabilities available in the ED at this time, and I doubt any other [facility] requiring further screening and/or treatment in the ED prior to admission.    Barkley Boards, PA-C 02/13/19 0233    Sabas Sous, MD 02/14/19 2253

## 2019-02-13 NOTE — Progress Notes (Addendum)
PROGRESS NOTE  Phillip Butler. ZOX:[id number] DOB: 07/25/1960 DOA: 02/12/2019 PCP: Corwin Levins, MD  HPI/Recap of past 24 hours: Per admitting HPI: Phillip Butler. is a 59 y.o. male.  With past medical history of autism, obesity, anemia of chronic disease, hyperlipidemia, hypertension who presents the emergency department with hypoxia.  The patient has had a few days of shortness of breath.  His brother took him to [place] [facility] to be evaluated where he was found to have oxygen saturations of 87% on room air.  Patient had an x-ray that was suspicious for potential COVID-19.  Review of EMR shows that chest x-ray performed on 02/09/2019 showed multi lobar patchy antral infiltrates.  Patient has had a slight cough and has been feeling unwell for the past few days.  He is a difficult historian and does not give specific details or timelines.  He does feel somewhat short of breath. Patient states that he is sob at rest and worse with ambulation. No CP. + orthopnea and states that has been going on for a long time.  He uses a CPAP and states he has a history of sleep apnea  Subjective: Patient seen and examined at bedside complaint of shortness of breath.  Assessment/Plan: Principal Problem:   Acute on chronic diastolic CHF (congestive heart failure) (HCC) Active Problems:   Obstructive sleep apnea   Essential hypertension   Acute respiratory failure with hypoxia (HCC)   Obesity (BMI 30-39.9)   History of DVT (deep vein thrombosis)  1.  Acute respiratory failure with hypoxia likely secondary to acute on chronic congestive heart failure diastolic decompensation.  Is supposed to be on continuous pulse ox but there is no nontoxic available continue IV Lasix 40 mg daily strict ins and outs record 2D echo pending.  2.  Hypertension slightly uncontrolled continue lisinopril from home also blood pressure added due to tachycardia.  3.  History of DVT years ago patient has been continued on chronic  Eliquis.  He does not have any PE by his imaging studies at this time bilateral lower extremity ultrasound was ordered to rule out any recurrent DVT.  5.  Obstructive sleep apnea he uses his CPAP but he complained it makes him uncomfortable.  6.  Morbid obesity.  Patient will benefit from weight loss  7.  Possible autism spectrum disorder his speech is slightly impaired and difficult to discern  Code Status: Full  Severity of Illness: The appropriate patient status for this patient is INPATIENT. Inpatient status is judged to be reasonable and necessary in order to provide the required intensity of service to ensure the patient's safety. The patient's presenting symptoms, physical exam findings, and initial radiographic and laboratory data in the context of their chronic comorbidities is felt to place them at high risk for further clinical deterioration. Furthermore, it is not anticipated that the patient will be medically stable for discharge from the hospital within 2 midnights of admission. The following factors support the patient status of inpatient.   " The patient's presenting symptoms include hypoxia with admitting oxygen of 87% on room air respiratory distress. " The worrisome physical exam findings include hypoxia. " The initial radiographic and laboratory data are worrisome because of abnormal chest x-ray. " The chronic co-morbidities include autism spectrum disorder.   * I certify that at the point of admission it is my clinical judgment that the patient will require inpatient hospital care spanning beyond 2 midnights from the point of admission due to  high intensity of service, high risk for further deterioration and high frequency of surveillance required.*    Family Communication: None at bedside  Disposition Plan: Home when stable   Consultants:  None  Procedures:  None  Antimicrobials:  None  DVT prophylaxis: Eliquis   Objective: Vitals:   02/13/19 0015  02/13/19 0222 02/13/19 0314 02/13/19 0426  BP: (!) 148/92 (!) 153/87 (!) 141/84   Pulse: (!) 115 94 93   Resp: (!) 23 (!) 22 20   Temp:   98.2 F (36.8 C)   TempSrc:   Oral   SpO2: 92% 92% 95%   Weight:    (!) 149.1 kg  Height:        Intake/Output Summary (Last 24 hours) at 02/13/2019 0841 Last data filed at 02/13/2019 0700 Gross per 24 hour  Intake --  Output 1500 ml  Net -1500 ml   Filed Weights   02/12/19 2017 02/13/19 0426  Weight: (!) 169.6 kg (!) 149.1 kg   Body mass index is 47.16 kg/m.  Exam:  . General: 60 y.o. year-old male well developed well nourished in no acute distress.  Alert and oriented x3.  Morbid obesity . Cardiovascular: Regular rate and rhythm with no rubs or gallops.  No thyromegaly or JVD noted.   Marland Kitchen Respiratory: Clear to auscultation with no wheezes or rales. Good inspiratory effort. . Abdomen: Soft nontender nondistended with normal bowel sounds x4 quadrants. . Musculoskeletal: 2+ lower extremity edema. 2/4 pulses in all 4 extremities. . Skin: No ulcerative lesions noted or rashes, . Psychiatry: Mood is appropriate for condition and setting speech is difficult to discern he have to listen carefully to understand him    Data Reviewed: CBC: Recent Labs  Lab 02/12/19 2037  WBC 4.4  NEUTROABS 2.8  HGB 13.4  HCT 44.1  MCV 95.5  PLT 183   Basic Metabolic Panel: Recent Labs  Lab 02/12/19 2037  NA 142  K 4.5  CL 107  CO2 28  GLUCOSE 119*  BUN 27*  CREATININE 1.17  CALCIUM 8.4*   GFR: Estimated Creatinine Clearance: 99.4 mL/min (by C-G formula based on SCr of 1.17 mg/dL). Liver Function Tests: Recent Labs  Lab 02/12/19 2037  AST 31  ALT 27  ALKPHOS 99  BILITOT 0.5  PROT 7.5  ALBUMIN 3.6   No results for input(s): LIPASE, AMYLASE in the last 168 hours. No results for input(s): AMMONIA in the last 168 hours. Coagulation Profile: No results for input(s): INR, PROTIME in the last 168 hours. Cardiac Enzymes: No results for  input(s): CKTOTAL, CKMB, CKMBINDEX, TROPONINI in the last 168 hours. BNP (last 3 results) No results for input(s): PROBNP in the last 8760 hours. HbA1C: No results for input(s): HGBA1C in the last 72 hours. CBG: No results for input(s): GLUCAP in the last 168 hours. Lipid Profile: Recent Labs    02/12/19 2037  TRIG 105   Thyroid Function Tests: No results for input(s): TSH, T4TOTAL, FREET4, T3FREE, THYROIDAB in the last 72 hours. Anemia Panel: Recent Labs    02/12/19 2037  FERRITIN 81   Urine analysis:    Component Value Date/Time   COLORURINE YELLOW 06/25/2018 1449   APPEARANCEUR CLEAR 06/25/2018 1449   LABSPEC >=1.030 (A) 06/25/2018 1449   PHURINE 5.5 06/25/2018 1449   GLUCOSEU NEGATIVE 06/25/2018 1449   HGBUR TRACE-INTACT (A) 06/25/2018 1449   BILIRUBINUR NEGATIVE 06/25/2018 1449   KETONESUR NEGATIVE 06/25/2018 1449   PROTEINUR 100 (A) 02/18/2014 2107   UROBILINOGEN 0.2 06/25/2018  [place] 06/25/2018 [place] 06/25/2018 1449   Sepsis Labs: @LABRCNTIP (procalcitonin:4,lacticidven:4)  ) Recent Results (from the past 240 hour(s))  Blood Culture (routine x 2)     Status: None (Preliminary result)   Collection Time: 02/12/19  8:37 PM   Specimen: BLOOD  Result Value Ref Range Status   Specimen Description   Final    BLOOD RIGHT HAND Performed at [facility] [address] [place], [place] [address]    Special Requests   Final    BOTTLES DRAWN AEROBIC AND ANAEROBIC Blood Culture results may not be optimal due to an inadequate volume of blood received in culture bottles Performed at [facility] [address], [place], [place] [address]    Culture   Final    NO GROWTH < 12 HOURS Performed at [facility] [address] [place], [place] [address]    Report Status PENDING  Incomplete  Respiratory Panel by RT PCR (Flu A&B, Covid) - Nasopharyngeal Swab     Status: None   Collection  Time: 02/12/19  8:45 PM   Specimen: Nasopharyngeal Swab  Result Value Ref Range Status   SARS Coronavirus 2 by RT PCR NEGATIVE NEGATIVE Final    Comment: (NOTE) SARS-CoV-2 target nucleic acids are NOT DETECTED. The SARS-CoV-2 RNA is generally detectable in upper respiratoy specimens during the acute phase of infection. The lowest concentration of SARS-CoV-2 viral copies this assay can detect is 131 copies/mL. A negative result does not preclude SARS-Cov-2 infection and should not be used as the sole basis for treatment or other patient management decisions. A negative result may occur with  improper specimen collection/handling, submission of specimen other than nasopharyngeal swab, presence of viral mutation(s) within the areas targeted by this assay, and inadequate number of viral copies (<131 copies/mL). A negative result must be combined with clinical observations, patient history, and epidemiological information. The expected result is Negative. Fact Sheet for Patients:  PinkCheek.be Fact Sheet for Healthcare Providers:  GravelBags.it This test is not yet ap proved or cleared by the [place] FDA and  has been authorized for detection and/or diagnosis of SARS-CoV-2 by FDA under an Emergency Use Authorization (EUA). This EUA will remain  in effect (meaning this test can be used) for the duration of the COVID-19 declaration under Section 564(b)(1) of the Act, 21 U.S.C. section 360bbb-3(b)(1), unless the authorization is terminated or revoked sooner.    Influenza A by PCR NEGATIVE NEGATIVE Final   Influenza B by PCR NEGATIVE NEGATIVE Final    Comment: (NOTE) The Xpert Xpress SARS-CoV-2/FLU/RSV assay is intended as an aid in  the diagnosis of influenza from Nasopharyngeal swab specimens and  should not be used as a sole basis for treatment. Nasal washings and  aspirates are unacceptable for Xpert Xpress  SARS-CoV-2/FLU/RSV  testing. Fact Sheet for Patients: PinkCheek.be Fact Sheet for Healthcare Providers: GravelBags.it This test is not yet approved or cleared by the [place] FDA and  has been authorized for detection and/or diagnosis of SARS-CoV-2 by  FDA under an Emergency Use Authorization (EUA). This EUA will remain  in effect (meaning this test can be used) for the duration of the  Covid-19 declaration under Section 564(b)(1) of the Act, 21  U.S.C. section 360bbb-3(b)(1), unless the authorization is  terminated or revoked. Performed at [facility], [place] [address] [place], [place] [address]   Blood Culture (routine x 2)  Status: None (Preliminary result)   Collection Time: 02/12/19  8:49 PM   Specimen: BLOOD  Result Value Ref Range Status   Specimen Description   Final    BLOOD RIGHT HAND Performed at [facility], [address] [place], [place] [address]    Special Requests   Final    BOTTLES DRAWN AEROBIC AND ANAEROBIC Blood Culture results may not be optimal due to an inadequate volume of blood received in culture bottles Performed at [facility], [address] [place], [place] [address]    Culture   Final    NO GROWTH < 12 HOURS Performed at [facility], [address] [place], [place] [address]    Report Status PENDING  Incomplete      Studies: CT Angio Chest PE W and/or Wo Contrast  Result Date: 02/13/2019 CLINICAL DATA:  Shortness of breath and positive D-dimer EXAM: CT ANGIOGRAPHY CHEST WITH CONTRAST TECHNIQUE: Multidetector CT imaging of the chest was performed using the standard protocol during bolus administration of intravenous contrast. Multiplanar CT image reconstructions and MIPs were obtained to evaluate the vascular anatomy. CONTRAST:  OMNIPAQUE IOHEXOL 350 MG/ML SOLN COMPARISON:  None. FINDINGS: Cardiovascular:  Suboptimal opacification of the pulmonary arteries due to bolus timing, limiting evaluation beyond the lobar branches. Within that limitation, there is no central pulmonary embolus. The size of the main pulmonary artery is normal. Heart size is normal, with no pericardial effusion. The course and caliber of the aorta are normal. There is mild atherosclerotic calcification. Opacification decreased due to pulmonary arterial phase contrast bolus timing. There are coronary artery calcifications. Mediastinum/Nodes: No mediastinal, hilar or axillary lymphadenopathy. The visualized thyroid and thoracic esophageal course are unremarkable. Lungs/Pleura: Airways are patent. No focal consolidation, pulmonary infarct or pleural effusion. Upper Abdomen: Contrast bolus timing is not optimized for evaluation of the abdominal organs. The visualized portions of the organs of the upper abdomen are normal. Musculoskeletal: No chest wall abnormality. No bony spinal canal stenosis. Review of the MIP images confirms the above findings. IMPRESSION: 1. Suboptimal opacification of the pulmonary arteries due to bolus timing. Within that limitation, there is no central pulmonary embolus. 2. No acute thoracic abnormality. Aortic Atherosclerosis (ICD10-I70.0). Electronically Signed   By: Deatra Robinson M.D.   On: 02/13/2019 00:53   DG Chest Port 1 View  Result Date: 02/12/2019 CLINICAL DATA:  59 year old male with shortness of breath. EXAM: PORTABLE CHEST 1 VIEW COMPARISON:  Chest radiograph dated 02/09/2019 FINDINGS: Ill-defined hazy density in the peripheral/subpleural aspect the left mid to lower lung field similar to prior radiograph. The right lung is clear. There is no pleural effusion or pneumothorax. There is cardiomegaly. Acute osseous pathology. IMPRESSION: Left mid to lower lung field subpleural atelectasis or infiltrate. Clinical correlation is recommended. Electronically Signed   By: Elgie Collard M.D.   On: 02/12/2019 21:15      Scheduled Meds: . apixaban  5 mg Oral BID  . aspirin EC  81 mg Oral Daily  . atorvastatin  40 mg Oral q1800  . cholecalciferol  1,000 Units Oral Daily  . feeding supplement (ENSURE ENLIVE)  237 mL Oral BID BM  . furosemide  40 mg Intravenous Daily  . gabapentin  600 mg Oral QHS  . lisinopril  20 mg Oral Daily  . metoprolol tartrate  12.5 mg Oral BID  . pantoprazole  40 mg Oral Daily  . sertraline  100 mg Oral Daily  . sodium chloride (PF)      .  sodium chloride (PF)      . sodium chloride flush  3 mL Intravenous Q12H  . tamsulosin  0.4 mg Oral BID  . vitamin B-12  1,000 mcg Oral Daily    Continuous Infusions: . sodium chloride       LOS: 0 days     Myrtie NeitherNwannadiya Lissie Hinesley, MD [facility]  To reach me or the doctor on call, go to: [email or web address] Password [email or web address]  02/13/2019, 8:41 AM

## 2019-02-13 NOTE — H&P (Signed)
History and Physical    Phillip Butler. [id number]:[id number] DOB: 1960-07-16 DOA: 02/12/2019  PCP: Corwin Levins, MD  Patient coming from: Home  I have personally briefly reviewed patient's old medical records in [facility]  Chief Complaint: SOB  HPI: Phillip Butler. is a 59 y.o. male with medical history significant of autism, obesity, OSA on CPAP, HTN, chronic diastolic CHF, prior DVT now on chronic eliquis.  Patient presents to the ED with SOB, hypoxia.  Symptoms onset a few days ago, progressively worsening.  Has orthopnea though that's ongoing for a long time.  SOB at rest, worse with ambulation.  Patient had CXR on 1/19 that was suspicious for COVID-19.  Brother took him to [employer] today where O2 sats 87% on RA.  On ROS: legs have been swelling more recently patient admits.   ED Course: BNP elevated at 285.  COVID is negative by PCR (as is flu), procalcitonin neg, WBC nl, CRP <0.5.  D.Dimer 1.0.  Given 20mg  IV lasix in ED.  CTA has suboptimal contrast timing but no large central PE seen.  Lungs are clear.   Review of Systems: As per HPI, otherwise all review of systems negative.  Past Medical History:  Diagnosis Date  . Allergic rhinitis   . Anemia of chronic disease   . Anxiety   . Asthma   . Autism   . 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 Depression   . Gallstone   . GERD (gastroesophageal reflux disease)   . Hyperlipidemia   . Hypertension   . IBS (irritable bowel syndrome)   . Obesity   . OSA (obstructive sleep apnea)   . Pneumonia   . UTI (urinary tract infection)     Past Surgical History:  Procedure Laterality Date  . ORIF ANKLE FRACTURE Left 03/02/2015   Procedure: OPEN REDUCTION INTERNAL FIXATION (ORIF) LEFT ANKLE TRIMALLEOLAR FRACTURE;  Surgeon: 04/30/2015, MD;  Location: [facility] OR;  Service: Orthopedics;  Laterality: Left;     reports that he has never smoked. He has never used smokeless tobacco. He reports that he does not drink alcohol  or use drugs.  Allergies  Allergen Reactions  . Fluoxetine Hcl     REACTION: ineffective  . Xanax [Alprazolam]     Made patient too sleepy    Family History  Problem Relation Age of Onset  . Irritable bowel syndrome Mother   . Heart disease Father   . Alcohol abuse Other   . Arthritis Other   . Breast cancer Other   . Prostate cancer Other   . Hyperlipidemia Other   . Heart disease Other   . Stroke Other   . Kidney disease Other   . Diabetes Other   . Pancreatic cancer Other   . Colon cancer Neg Hx   . Esophageal cancer Neg Hx      Prior to Admission medications   Medication Sig Start Date End Date Taking? Authorizing Provider  acetaminophen (TYLENOL) 325 MG tablet Take 650 mg by mouth every 6 (six) hours as needed for moderate pain.   Yes [provider]  albuterol (VENTOLIN HFA) 108 (90 Base) MCG/ACT inhaler Inhale 2 puffs into the lungs every 6 (six) hours as needed for wheezing or shortness of breath. 06/25/18  Yes 08/25/18, MD  aspirin EC 81 MG tablet Take 81 mg by mouth daily.   Yes [provider]  atorvastatin (LIPITOR) 40 MG tablet TAKE 1 TABLET BY MOUTH EVERY DAY  Patient taking differently: Take 40 mg by mouth daily.  01/08/19  Yes Corwin Levins, MD  cholecalciferol (VITAMIN D3) 25 MCG (1000 UNIT) tablet Take 1,000 Units by mouth daily.   Yes [provider]  DM-Phenylephrine-Acetaminophen (ALKA-SELTZER PLS SINUS & COUGH) 10-5-325 MG CAPS Take 2 capsules by mouth every 8 (eight) hours as needed (congestion).   Yes [provider]  furosemide (LASIX) 40 MG tablet Take 1 tablet (40 mg total) by mouth 2 (two) times daily. 07/25/16  Yes Corwin Levins, MD  gabapentin (NEURONTIN) 300 MG capsule TAKE 2 CAPSULES (600 MG TOTAL) BY MOUTH AT BEDTIME. 01/22/19  Yes Corwin Levins, MD  hydroxypropyl methylcellulose / hypromellose (ISOPTO TEARS / GONIOVISC) 2.5 % ophthalmic solution Place 1 drop into both eyes 3 (three) times daily as needed for  dry eyes.   Yes [provider]  lisinopril (ZESTRIL) 20 MG tablet TAKE 1 TABLET (20 MG TOTAL) BY MOUTH DAILY. 11/05/18  Yes Corwin Levins, MD  omeprazole (PRILOSEC) 20 MG capsule TAKE 1 CAPSULE (20 MG TOTAL) BY MOUTH DAILY. Patient taking differently: Take 20 mg by mouth daily.  10/26/18  Yes Corwin Levins, MD  sertraline (ZOLOFT) 100 MG tablet TAKE 1 TABLET BY MOUTH EVERY DAY Patient taking differently: Take 100 mg by mouth daily.  01/17/19  Yes Corwin Levins, MD  tamsulosin (FLOMAX) 0.4 MG CAPS capsule Take 1 capsule (0.4 mg total) by mouth 2 (two) times daily. 02/09/19  Yes Corwin Levins, MD  vitamin B-12 (CYANOCOBALAMIN) 1000 MCG tablet Take 1,000 mcg by mouth daily.   Yes [provider]  azelastine (OPTIVAR) 0.05 % ophthalmic solution Place 1 drop into both eyes 2 (two) times daily. Patient not taking: Reported on 02/13/2019 09/23/17   Corwin Levins, MD  buPROPion (WELLBUTRIN XL) 300 MG 24 hr tablet Take 1 tablet (300 mg total) by mouth daily. Patient not taking: Reported on 02/13/2019 06/25/18   Corwin Levins, MD  carvedilol (COREG) 6.25 MG tablet Take 1 tablet (6.25 mg total) by mouth 2 (two) times daily with a meal. Patient not taking: Reported on 02/13/2019 04/19/15   Corwin Levins, MD  fesoterodine (TOVIAZ) 4 MG TB24 tablet Take 1 tablet (4 mg total) by mouth daily. Patient not taking: Reported on 02/13/2019 04/19/15   Corwin Levins, MD  fexofenadine (ALLEGRA) 180 MG tablet Take 1 tablet (180 mg total) by mouth daily. Patient not taking: Reported on 02/13/2019 02/09/19 02/09/20  Corwin Levins, MD  hydrocortisone (ANUSOL-HC) 2.5 % rectal cream Place 1 application rectally at bedtime. Use for 10 days Patient not taking: Reported on 02/13/2019 07/17/18   Meredith Pel, NP  hydrocortisone 1 % lotion Apply 1 application topically 2 (two) times daily. Patient not taking: Reported on 02/13/2019 07/11/16   Judi Saa, DO  oxybutynin (DITROPAN-XL) 5 MG 24 hr tablet Take 1 tablet (5  mg total) by mouth at bedtime. Patient not taking: Reported on 02/13/2019 06/11/17   Corwin Levins, MD  oxyCODONE (ROXICODONE) 5 MG immediate release tablet Take 1-2 tablets (5-10 mg total) by mouth every 4 (four) hours as needed for moderate pain or severe pain. Patient not taking: Reported on 02/13/2019 04/05/15   Corwin Levins, MD  rivaroxaban (XARELTO) 20 MG TABS tablet Take 20 mg by mouth every morning.  03/29/15   [provider]  traMADol (ULTRAM) 50 MG tablet Take 1 tablet (50 mg total) by mouth every 8 (eight) hours as needed.  Patient not taking: Reported on 02/13/2019 06/20/16   Biagio Borg, MD  traZODone (DESYREL) 50 MG tablet Take 1 tablet (50 mg total) by mouth at bedtime. Overdue for yearly physical must see Md for refills Patient not taking: Reported on 02/13/2019 09/04/15   Biagio Borg, MD  triamcinolone cream (KENALOG) 0.1 % Apply 1 application topically 2 (two) times daily. Patient not taking: Reported on 02/13/2019 09/29/18 09/29/19  Biagio Borg, MD  Vitamin D, Ergocalciferol, (DRISDOL) 1.25 MG (50000 UT) CAPS capsule TAKE ONE CAPSULE BY MOUTH EVERY 7 DAYS Patient not taking: Reported on 02/13/2019 06/25/18   Biagio Borg, MD    Physical Exam: Vitals:   02/12/19 2100 02/12/19 2245 02/12/19 2330 02/13/19 0015  BP: (!) 161/82 (!) 160/66 (!) 177/103 (!) 148/92  Pulse: (!) 105 (!) 105 88 (!) 115  Resp: (!) 27 (!) 25 (!) 24 (!) 23  Temp:      TempSrc:      SpO2: 96% 93% 91% 92%  Weight:      Height:        Constitutional: NAD, calm, comfortable Eyes: PERRL, lids and conjunctivae normal ENMT: Mucous membranes are moist. Posterior pharynx clear of any exudate or lesions.Normal dentition.  Neck: normal, supple, no masses, no thyromegaly Respiratory: clear to auscultation bilaterally, no wheezing, no crackles. Normal respiratory effort. No accessory muscle use.  Cardiovascular: Regular rate and rhythm, no murmurs / rubs / gallops. 2+ pitting edema. 2+ pedal pulses. No carotid  bruits.  Abdomen: no tenderness, no masses palpated. No hepatosplenomegaly. Bowel sounds positive.  Musculoskeletal: no clubbing / cyanosis. No joint deformity upper and lower extremities. Good ROM, no contractures. Normal muscle tone.  Skin: no rashes, lesions, ulcers. No induration Neurologic: CN 2-12 grossly intact. Sensation intact, DTR normal. Strength 5/5 in all 4.  Psychiatric: Normal judgment and insight. Alert and oriented x 3. Normal mood.    Labs on Admission: I have personally reviewed following labs and imaging studies  CBC: Recent Labs  Lab 02/12/19 2037  WBC 4.4  NEUTROABS 2.8  HGB 13.4  HCT 44.1  MCV 95.5  PLT 841   Basic Metabolic Panel: Recent Labs  Lab 02/12/19 2037  NA 142  K 4.5  CL 107  CO2 28  GLUCOSE 119*  BUN 27*  CREATININE 1.17  CALCIUM 8.4*   GFR: Estimated Creatinine Clearance: 107.3 mL/min (by C-G formula based on SCr of 1.17 mg/dL). Liver Function Tests: Recent Labs  Lab 02/12/19 2037  AST 31  ALT 27  ALKPHOS 99  BILITOT 0.5  PROT 7.5  ALBUMIN 3.6   No results for input(s): LIPASE, AMYLASE in the last 168 hours. No results for input(s): AMMONIA in the last 168 hours. Coagulation Profile: No results for input(s): INR, PROTIME in the last 168 hours. Cardiac Enzymes: No results for input(s): CKTOTAL, CKMB, CKMBINDEX, TROPONINI in the last 168 hours. BNP (last 3 results) No results for input(s): PROBNP in the last 8760 hours. HbA1C: No results for input(s): HGBA1C in the last 72 hours. CBG: No results for input(s): GLUCAP in the last 168 hours. Lipid Profile: Recent Labs    02/12/19 2037  TRIG 105   Thyroid Function Tests: No results for input(s): TSH, T4TOTAL, FREET4, T3FREE, THYROIDAB in the last 72 hours. Anemia Panel: Recent Labs    02/12/19 2037  FERRITIN 81   Urine analysis:    Component Value Date/Time   COLORURINE YELLOW 06/25/2018 1449   APPEARANCEUR CLEAR 06/25/2018 1449   LABSPEC >=  1.030 (A) 06/25/2018  1449   PHURINE 5.5 06/25/2018 1449   GLUCOSEU NEGATIVE 06/25/2018 1449   HGBUR TRACE-INTACT (A) 06/25/2018 1449   BILIRUBINUR NEGATIVE 06/25/2018 1449   KETONESUR NEGATIVE 06/25/2018 1449   PROTEINUR 100 (A) 02/18/2014 2107   UROBILINOGEN 0.2 06/25/2018 1449   NITRITE NEGATIVE 06/25/2018 1449   LEUKOCYTESUR NEGATIVE 06/25/2018 1449    Radiological Exams on Admission: CT Angio Chest PE W and/or Wo Contrast  Result Date: 02/13/2019 CLINICAL DATA:  Shortness of breath and positive D-dimer EXAM: CT ANGIOGRAPHY CHEST WITH CONTRAST TECHNIQUE: Multidetector CT imaging of the chest was performed using the standard protocol during bolus administration of intravenous contrast. Multiplanar CT image reconstructions and MIPs were obtained to evaluate the vascular anatomy. CONTRAST:  100mL OMNIPAQUE IOHEXOL 350 MG/ML SOLN COMPARISON:  None. FINDINGS: Cardiovascular: Suboptimal opacification of the pulmonary arteries due to bolus timing, limiting evaluation beyond the lobar branches. Within that limitation, there is no central pulmonary embolus. The size of the main pulmonary artery is normal. Heart size is normal, with no pericardial effusion. The course and caliber of the aorta are normal. There is mild atherosclerotic calcification. Opacification decreased due to pulmonary arterial phase contrast bolus timing. There are coronary artery calcifications. Mediastinum/Nodes: No mediastinal, hilar or axillary lymphadenopathy. The visualized thyroid and thoracic esophageal course are unremarkable. Lungs/Pleura: Airways are patent. No focal consolidation, pulmonary infarct or pleural effusion. Upper Abdomen: Contrast bolus timing is not optimized for evaluation of the abdominal organs. The visualized portions of the organs of the upper abdomen are normal. Musculoskeletal: No chest wall abnormality. No bony spinal canal stenosis. Review of the MIP images confirms the above findings. IMPRESSION: 1. Suboptimal opacification  of the pulmonary arteries due to bolus timing. Within that limitation, there is no central pulmonary embolus. 2. No acute thoracic abnormality. Aortic Atherosclerosis (ICD10-I70.0). Electronically Signed   By: Deatra RobinsonKevin  Herman M.D.   On: 02/13/2019 00:53   DG Chest Port 1 View  Result Date: 02/12/2019 CLINICAL DATA:  59 year old male with shortness of breath. EXAM: PORTABLE CHEST 1 VIEW COMPARISON:  Chest radiograph dated 02/09/2019 FINDINGS: Ill-defined hazy density in the peripheral/subpleural aspect the left mid to lower lung field similar to prior radiograph. The right lung is clear. There is no pleural effusion or pneumothorax. There is cardiomegaly. Acute osseous pathology. IMPRESSION: Left mid to lower lung field subpleural atelectasis or infiltrate. Clinical correlation is recommended. Electronically Signed   By: Elgie CollardArash  Radparvar M.D.   On: 02/12/2019 21:15    EKG: Independently reviewed.  Assessment/Plan Principal Problem:   Acute on chronic diastolic CHF (congestive heart failure) (HCC) Active Problems:   Obstructive sleep apnea   Essential hypertension   Acute respiratory failure with hypoxia (HCC)   Obesity (BMI 30-39.9)   History of DVT (deep vein thrombosis)    1. Acute resp failure with hypoxia - 1. Likely acute on chronic diastolic CHF, decompensated further by underlying HVOS I suspect. 2. Cont pulse ox 2. Acute on chronic diastolic CHF - 1. CHF pathway 2. Got 20mg  IV lasix in ED, giving another 20mg  IV lasix now 3. Strict intake and output 4. 40mg  IV lasix daily 5. 2d echo 3. HTN - 1. Cont home lisinopril 2. Mild tachy to 120, still with elevated BP 3. Will start low dose lopressor as well 4. H/o DVT - years in past 1. Continue chronic eliquis 2. No central PE on today CTA 3. Will get US BLE to make sure we arnt missing a recurrent DVT causing his  BLE swelling and subsequent small PE, but doubt this. 5. OSA - cont CPAP  DVT prophylaxis: Continue eliquis Code  Status: Full Family Communication: No family in room Disposition Plan: Home after admit Consults called: None Admission status: Place in 64   Doylene Splinter M. DO [facility]  How to contact the [facility] Attending or Consulting provider 7A - 7P or covering provider during after hours 7P -7A, for this patient?  1. Check the care team in [facility] and look for a) attending/consulting [facility] provider listed and b) the [facility] team listed 2. Log into [email or web address]  Amion Physician Scheduling and messaging for groups and whole hospitals  On call and physician scheduling software for group practices, residents, hospitalists and other medical providers for call, clinic, rotation and shift schedules. [employer] is a hospital-wide system for scheduling doctors and paging doctors on call. EasyPlot is for scientific plotting and data analysis.  [email or web address]  and use [place]'s universal password to access. If you do not have the password, please contact the hospital operator.  3. Locate the [facility] provider you are looking for under [facility] and page to a number that you can be directly reached. 4. If you still have difficulty reaching the provider, please page the [facility] (Director on Call) for the Hospitalists listed on amion for assistance.  02/13/2019, 1:05 AM

## 2019-02-13 NOTE — Progress Notes (Signed)
Rx Brief note: Lovenox  Wt = 169 lb, CrCl>100 ml/hr BMI = 53  Rx adjusted Lovenox to 80 mg daily in pt with BMI>30  Thanks Lorenza Evangelist 02/13/2019 12:41 AM

## 2019-02-13 NOTE — Progress Notes (Signed)
Bilateral lower extremity venous duplex has been completed. Preliminary results can be found in CV Proc through chart review.   02/13/19 2:44 PM Olen Cordial RVT

## 2019-02-13 NOTE — Progress Notes (Signed)
  Echocardiogram 2D Echocardiogram has been performed with Definity.  Gerda Diss 02/13/2019, 10:34 AM

## 2019-02-14 LAB — BASIC METABOLIC PANEL
Anion gap: 7 (ref 5–15)
BUN: 34 mg/dL — ABNORMAL HIGH (ref 6–20)
CO2: 33 mmol/L — ABNORMAL HIGH (ref 22–32)
Calcium: 8.7 mg/dL — ABNORMAL LOW (ref 8.9–10.3)
Chloride: 101 mmol/L (ref 98–111)
Creatinine, Ser: 1.3 mg/dL — ABNORMAL HIGH (ref 0.61–1.24)
GFR calc Af Amer: >60 mL/min (ref >60)
GFR calc non Af Amer: 60 mL/min — ABNORMAL LOW (ref >60)
Glucose, Bld: 108 mg/dL — ABNORMAL HIGH (ref 70–99)
Potassium: 4.2 mmol/L (ref 3.5–5.1)
Sodium: 141 mmol/L (ref 135–145)

## 2019-02-14 LAB — NOVEL CORONAVIRUS, NAA: SARS-CoV-2, NAA: NOT DETECTED

## 2019-02-14 MED ORDER — ADULT MULTIVITAMIN W/MINERALS CH
1.0000 | ORAL_TABLET | Freq: Every day | ORAL | Status: DC
  Administered 2019-02-14 – 2019-02-17 (×4): 1 via ORAL
  Filled 2019-02-14 (×4): qty 1

## 2019-02-14 MED ORDER — PSEUDOEPHEDRINE HCL 30 MG PO TABS
15.0000 mg | ORAL_TABLET | Freq: Three times a day (TID) | ORAL | Status: DC | PRN
  Administered 2019-02-14: 15 mg via ORAL
  Filled 2019-02-14 (×2): qty 1

## 2019-02-14 MED ORDER — ACETAMINOPHEN 325 MG PO TABS
650.0000 mg | ORAL_TABLET | Freq: Four times a day (QID) | ORAL | Status: DC | PRN
  Administered 2019-02-14: 650 mg via ORAL
  Filled 2019-02-14: qty 2

## 2019-02-14 MED ORDER — ENSURE MAX PROTEIN PO LIQD
11.0000 [oz_av] | Freq: Two times a day (BID) | ORAL | Status: DC
  Administered 2019-02-14 – 2019-02-17 (×7): 11 [oz_av] via ORAL
  Filled 2019-02-14 (×8): qty 330

## 2019-02-14 NOTE — Progress Notes (Addendum)
PROGRESS NOTE  Phillip Ohmharles G Simmon Jr. ZOX:[id number] DOB: 1960-04-17 DOA: 02/12/2019 PCP: Corwin LevinsJohn, James W, MD  HPI/Recap of past 24 hours: Per admitting HPI: Phillip OhmCharles G Carraway Jr. is a 59 y.o. male.  With past medical history of autism, obesity, anemia of chronic disease, hyperlipidemia, hypertension who presents the emergency department with hypoxia.  The patient has had a few days of shortness of breath.  His brother took him to [place] to be evaluated where he was found to have oxygen saturations of 87% on room air.  Patient had an x-ray that was suspicious for potential COVID-19.  Review of EMR shows that chest x-ray performed on 02/09/2019 showed multi lobar patchy antral infiltrates.  Patient has had a slight cough and has been feeling unwell for the past few days.  He is a difficult historian and does not give specific details or timelines.  He does feel somewhat short of breath. Patient states that he is sob at rest and worse with ambulation. No CP. + orthopnea and states that has been going on for a long time.  He uses a CPAP and states he has a history of sleep apnea  February 14, 2019. Subjective: Patient seen and examined at bedside he stated that he feels better today compared to when he first came in he sitting in a recliner.  No stated he refuses CPAP today.  His oxygen went down to 86% on room air while he was sleeping so he was started on O2 nasal cannula 2 L/min is currently satting at 90%.  Assessment/Plan: Principal Problem:   Acute on chronic diastolic CHF (congestive heart failure) (HCC) Active Problems:   Obstructive sleep apnea   Essential hypertension   Acute respiratory failure with hypoxia (HCC)   Obesity (BMI 30-39.9)   History of DVT (deep vein thrombosis)  1.  Acute respiratory failure with hypoxia likely secondary to acute on chronic congestive heart failure diastolic decompensation.  on continuous pulse ox  continue IV Lasix 40 mg daily strict ins and outs record 2D  echo showing grade 2 diastolic dysfunction.   His oxygen saturation is stable on 2 L continue IV Lasix.  COVID-19 test is negative Preliminary blood cultures are no growth.  2.  Hypertension slightly uncontrolled continue lisinopril from home also blood pressure added due to tachycardia.  He is slightly hypotensive today we will hold accordingly depending on the blood pressure parameters  3.  History of DVT years ago patient has been continued on chronic Eliquis.  He does not have any PE by his imaging studies at this time bilateral lower extremity ultrasound was ordered to rule out any recurrent DVT.  Result shows there is no evidence of deep venous thrombosis  5.  Obstructive sleep apnea he uses his CPAP but he complained it makes him uncomfortable.  6.  Morbid obesity.  Patient will benefit from weight loss  7.  Possible autism spectrum disorder his speech is slightly impaired and difficult to discern  Code Status: Full  Severity of Illness: The appropriate patient status for this patient is INPATIENT. Inpatient status is judged to be reasonable and necessary in order to provide the required intensity of service to ensure the patient's safety. The patient's presenting symptoms, physical exam findings, and initial radiographic and laboratory data in the context of their chronic comorbidities is felt to place them at high risk for further clinical deterioration. Furthermore, it is not anticipated that the patient will be medically stable for discharge from the  hospital within 2 midnights of admission. The following factors support the patient status of inpatient.   " The patient's presenting symptoms include hypoxia with admitting oxygen of 87% on room air respiratory distress. " The worrisome physical exam findings include hypoxia. " The initial radiographic and laboratory data are worrisome because of abnormal chest x-ray. " The chronic co-morbidities include autism spectrum disorder.   *  I certify that at the point of admission it is my clinical judgment that the patient will require inpatient hospital care spanning beyond 2 midnights from the point of admission due to high intensity of service, high risk for further deterioration and high frequency of surveillance required.*    Family Communication: None at bedside  Disposition Plan: Home when stable   Consultants:  None  Procedures:  None  Antimicrobials:  None  DVT prophylaxis: Eliquis   Objective: Vitals:   02/13/19 1900 02/13/19 2100 02/14/19 0440 02/14/19 1242  BP:  120/80 (!) 119/58 (!) 90/52  Pulse:  76 62 78  Resp:  20 20 17   Temp:  98 F (36.7 C) 98 F (36.7 C) 99.1 F (37.3 C)  TempSrc:  Oral Oral Oral  SpO2: 95%  96% 97%  Weight:      Height:        Intake/Output Summary (Last 24 hours) at 02/14/2019 1601 Last data filed at 02/14/2019 1300 Gross per 24 hour  Intake 480 ml  Output 1150 ml  Net -670 ml   Filed Weights   02/12/19 2017 02/13/19 0426  Weight: (!) 169.6 kg (!) 149.1 kg   Body mass index is 47.16 kg/m.  Exam:  . General: 59 y.o. year-old male well developed well nourished in no acute distress.  Alert and oriented x3.  Morbid obesity . Cardiovascular: Regular rate and rhythm with no rubs or gallops.  No thyromegaly or JVD noted.   46 Respiratory: Clear to auscultation with no wheezes or rales. Good inspiratory effort. . Abdomen: Soft nontender nondistended with normal bowel sounds x4 quadrants. . Musculoskeletal: 2+ lower extremity edema. 2/4 pulses in all 4 extremities. . Skin: No ulcerative lesions noted or rashes, . Psychiatry: Mood is appropriate for condition and setting speech is difficult to discern he have to listen carefully to understand him    Data Reviewed: CBC: Recent Labs  Lab 02/12/19 2037  WBC 4.4  NEUTROABS 2.8  HGB 13.4  HCT 44.1  MCV 95.5  PLT 183   Basic Metabolic Panel: Recent Labs  Lab 02/12/19 2037 02/14/19 0442  NA 142 141  K  4.5 4.2  CL 107 101  CO2 28 33*  GLUCOSE 119* 108*  BUN 27* 34*  CREATININE 1.17 1.30*  CALCIUM 8.4* 8.7*   GFR: Estimated Creatinine Clearance: 89.5 mL/min (A) (by C-G formula based on SCr of 1.3 mg/dL (H)). Liver Function Tests: Recent Labs  Lab 02/12/19 2037  AST 31  ALT 27  ALKPHOS 99  BILITOT 0.5  PROT 7.5  ALBUMIN 3.6   No results for input(s): LIPASE, AMYLASE in the last 168 hours. No results for input(s): AMMONIA in the last 168 hours. Coagulation Profile: No results for input(s): INR, PROTIME in the last 168 hours. Cardiac Enzymes: No results for input(s): CKTOTAL, CKMB, CKMBINDEX, TROPONINI in the last 168 hours. BNP (last 3 results) No results for input(s): PROBNP in the last 8760 hours. HbA1C: No results for input(s): HGBA1C in the last 72 hours. CBG: No results for input(s): GLUCAP in the last 168 hours. Lipid Profile: Recent  Labs    02/12/19 2037  TRIG 105   Thyroid Function Tests: No results for input(s): TSH, T4TOTAL, FREET4, T3FREE, THYROIDAB in the last 72 hours. Anemia Panel: Recent Labs    02/12/19 2037  FERRITIN 81   Urine analysis:    Component Value Date/Time   COLORURINE YELLOW 06/25/2018 1449   APPEARANCEUR CLEAR 06/25/2018 1449   LABSPEC >=1.030 (A) 06/25/2018 1449   PHURINE 5.5 06/25/2018 1449   GLUCOSEU NEGATIVE 06/25/2018 1449   HGBUR TRACE-INTACT (A) 06/25/2018 1449   BILIRUBINUR NEGATIVE 06/25/2018 1449   KETONESUR NEGATIVE 06/25/2018 1449   PROTEINUR 100 (A) 02/18/2014 2107   UROBILINOGEN 0.2 06/25/2018 1449   NITRITE NEGATIVE 06/25/2018 1449   LEUKOCYTESUR NEGATIVE 06/25/2018 1449   Sepsis Labs: @LABRCNTIP (procalcitonin:4,lacticidven:4)  ) Recent Results (from the past 240 hour(s))  Novel Coronavirus, NAA ([facility])     Status: None   Collection Time: 02/12/19  6:45 PM   Specimen: Nasopharyngeal(NP) swabs in vial transport medium   NASOPHARYNGE  IS THIS  Result Value Ref Range Status   SARS-CoV-2, NAA Not  Detected Not Detected Final    Comment: This nucleic acid amplification test was developed and its performance characteristics determined by [employer]. Nucleic acid amplification tests include RT-PCR and TMA. This test has not been FDA cleared or approved. This test has been authorized by FDA under an Emergency Use Authorization (EUA). This test is only authorized for the duration of time the declaration that circumstances exist justifying the authorization of the emergency use of in vitro diagnostic tests for detection of SARS-CoV-2 virus and/or diagnosis of COVID-19 infection under section 564(b)(1) of the Act, 21 U.S.C. 161WRU-0(A) (1), unless the authorization is terminated or revoked sooner. When diagnostic testing is negative, the possibility of a false negative result should be considered in the context of a patient's recent exposures and the presence of clinical signs and symptoms consistent with COVID-19. An individual without symptoms of COVID-19 and who is not shedding SARS-CoV-2 virus wo uld expect to have a negative (not detected) result in this assay.   Blood Culture (routine x 2)     Status: None (Preliminary result)   Collection Time: 02/12/19  8:37 PM   Specimen: BLOOD  Result Value Ref Range Status   Specimen Description   Final    BLOOD RIGHT HAND Performed at [facility] [address] [place], [place] [address]    Special Requests   Final    BOTTLES DRAWN AEROBIC AND ANAEROBIC Blood Culture results may not be optimal due to an inadequate volume of blood received in culture bottles Performed at [facility] [address] [place], [place] [address]    Culture   Final    NO GROWTH 1 DAY Performed at [facility], [place] [address], [place], [place] [address]    Report Status PENDING  Incomplete  Respiratory Panel by RT PCR (Flu A&B, Covid) - Nasopharyngeal Swab     Status: None   Collection Time: 02/12/19   8:45 PM   Specimen: Nasopharyngeal Swab  Result Value Ref Range Status   SARS Coronavirus 2 by RT PCR NEGATIVE NEGATIVE Final    Comment: (NOTE) SARS-CoV-2 target nucleic acids are NOT DETECTED. The SARS-CoV-2 RNA is generally detectable in upper respiratoy specimens during the acute phase of infection. The lowest concentration of SARS-CoV-2 viral copies this assay can detect is 131 copies/mL. A negative result does not preclude SARS-Cov-2 infection and should not be used as the  sole basis for treatment or other patient management decisions. A negative result may occur with  improper specimen collection/handling, submission of specimen other than nasopharyngeal swab, presence of viral mutation(s) within the areas targeted by this assay, and inadequate number of viral copies (<131 copies/mL). A negative result must be combined with clinical observations, patient history, and epidemiological information. The expected result is Negative. Fact Sheet for Patients:  https://www.moore.com/ Fact Sheet for Healthcare Providers:  https://www.young.biz/ This test is not yet ap proved or cleared by the [place] FDA and  has been authorized for detection and/or diagnosis of SARS-CoV-2 by FDA under an Emergency Use Authorization (EUA). This EUA will remain  in effect (meaning this test can be used) for the duration of the COVID-19 declaration under Section 564(b)(1) of the Act, 21 U.S.C. section 360bbb-3(b)(1), unless the authorization is terminated or revoked sooner.    Influenza A by PCR NEGATIVE NEGATIVE Final   Influenza B by PCR NEGATIVE NEGATIVE Final    Comment: (NOTE) The Xpert Xpress SARS-CoV-2/FLU/RSV assay is intended as an aid in  the diagnosis of influenza from Nasopharyngeal swab specimens and  should not be used as a sole basis for treatment. Nasal washings and  aspirates are unacceptable for Xpert Xpress SARS-CoV-2/FLU/RSV   testing. Fact Sheet for Patients: https://www.moore.com/ Fact Sheet for Healthcare Providers: https://www.young.biz/ This test is not yet approved or cleared by the [place] FDA and  has been authorized for detection and/or diagnosis of SARS-CoV-2 by  FDA under an Emergency Use Authorization (EUA). This EUA will remain  in effect (meaning this test can be used) for the duration of the  Covid-19 declaration under Section 564(b)(1) of the Act, 21  U.S.C. section 360bbb-3(b)(1), unless the authorization is  terminated or revoked. Performed at [facility], [address], [place], [place] [address]   Blood Culture (routine x 2)     Status: None (Preliminary result)   Collection Time: 02/12/19  8:49 PM   Specimen: BLOOD  Result Value Ref Range Status   Specimen Description   Final    BLOOD RIGHT HAND Performed at [facility], [address] [place], [place] [address]    Special Requests   Final    BOTTLES DRAWN AEROBIC AND ANAEROBIC Blood Culture results may not be optimal due to an inadequate volume of blood received in culture bottles Performed at [facility], [address], [place], [place] [address]    Culture   Final    NO GROWTH 1 DAY Performed at [facility], [address] [place], [place] [address]    Report Status PENDING  Incomplete      Studies: No results found.  Scheduled Meds: . apixaban  5 mg Oral BID  . aspirin EC  81 mg Oral Daily  . atorvastatin  40 mg Oral q1800  . cholecalciferol  1,000 Units Oral Daily  . furosemide  40 mg Intravenous Daily  . gabapentin  600 mg Oral QHS  . lisinopril  20 mg Oral Daily  . metoprolol tartrate  12.5 mg Oral BID  . multivitamin with minerals  1 tablet Oral Daily  . pantoprazole  40 mg Oral Daily  . Ensure Max Protein  11 oz Oral BID  . sertraline  100 mg Oral Daily  . sodium chloride flush  3 mL  Intravenous Q12H  . tamsulosin  0.4 mg Oral BID  . vitamin B-12  1,000 mcg Oral Daily  Continuous Infusions: . sodium chloride       LOS: 1 day     Myrtie Neither, MD [facility]  To reach me or the doctor on call, go to: [email or web address] Password [facility]  02/14/2019, 4:01 PM

## 2019-02-14 NOTE — Progress Notes (Signed)
Pt. Declines use of CPAP.  Will be available iff needs or wants change.

## 2019-02-14 NOTE — Progress Notes (Signed)
Pt BP 90/52. No complaints and resting in bed, MD Ugah made aware. No new orders. Will continue to monitor patient.

## 2019-02-14 NOTE — Progress Notes (Signed)
Initial Nutrition Assessment  RD working remotely.  DOCUMENTATION CODES:   Morbid obesity  INTERVENTION:   -D/c Ensure Enlive po BID, each supplement provides 350 kcal and 20 grams of protein -Ensure Max po BID, each supplement provides 150 kcal and 30 grams of protein.  -MVI with minerals daily  NUTRITION DIAGNOSIS:   Increased nutrient needs related to chronic illness(CHF) as evidenced by estimated needs.  GOAL:   Patient will meet greater than or equal to 90% of their needs  MONITOR:   PO intake, Supplement acceptance, Labs, Weight trends, Skin, I & O's  REASON FOR ASSESSMENT:   Malnutrition Screening Tool    ASSESSMENT:   Phillip Butler. is a 59 y.o. male with medical history significant of autism, obesity, OSA on CPAP, HTN, chronic diastolic CHF, prior DVT now on chronic eliquis.  Pt admitted with acute on chronic respiratory failure secondary to CHF exacerbation.   Reviewed I/O's: -1.1 L x 24 hours and -2.6 L since admission  UOP: 2.1 L x 24 hours  Attempted to speak with pt via phone, however, no answer.  Pt with good appetite; noted meal completion 100%. Pt is drinking Ensure supplements per MAR.   Reviewed wt hx; wt has been stable over the past 6 months. Slight weight gain likely related to edema.   Medications reviewed and include lasix.   Labs reviewed.   Diet Order:   Diet Order            Diet Heart Room service appropriate? Yes; Fluid consistency: Thin  Diet effective now              EDUCATION NEEDS:   No education needs have been identified at this time  Skin:  Skin Assessment: Reviewed RN Assessment  Last BM:  02/13/19  Height:   Ht Readings from Last 1 Encounters:  02/12/19 5\' 10"  (1.778 m)    Weight:   Wt Readings from Last 1 Encounters:  02/13/19 (!) 149.1 kg    Ideal Body Weight:  75.5 kg  BMI:  Body mass index is 47.16 kg/m.  Estimated Nutritional Needs:   Kcal:  2050-2250  Protein:  125-150  grams  Fluid:  2.0-2.2 L    Shyenne Maggard A. 08-23-1978, RD, LDN, CDCES Registered Dietitian II Certified Diabetes Care and Education Specialist Pager: [phone] After hours Pager: [phone]

## 2019-02-15 LAB — BASIC METABOLIC PANEL
Anion gap: 10 (ref 5–15)
BUN: 49 mg/dL — ABNORMAL HIGH (ref 6–20)
CO2: 30 mmol/L (ref 22–32)
Calcium: 8.9 mg/dL (ref 8.9–10.3)
Chloride: 100 mmol/L (ref 98–111)
Creatinine, Ser: 1.76 mg/dL — ABNORMAL HIGH (ref 0.61–1.24)
GFR calc Af Amer: 48 mL/min — ABNORMAL LOW (ref >60)
GFR calc non Af Amer: 41 mL/min — ABNORMAL LOW (ref >60)
Glucose, Bld: 121 mg/dL — ABNORMAL HIGH (ref 70–99)
Potassium: 4.3 mmol/L (ref 3.5–5.1)
Sodium: 140 mmol/L (ref 135–145)

## 2019-02-15 NOTE — Progress Notes (Signed)
PROGRESS NOTE  Phillip Butler.  DOB: 01/16/1961  PCP: Biagio Borg, MD WUJ:[id number]  DOA: 02/12/2019 Admitted From: Home  LOS: 2 days   Chief Complaint  Patient presents with  . Shortness of Breath   Brief narrative: Phillip Butleris a 59 y.o.male with PMH significant for autism, obesity, anemia of chronic disease, hyperlipidemia, hypertension. 1/18, patient was seen by PCP for shortness of breath and cough.  Chest x-ray was obtained which showed multifocal pneumonia.  Patient was called to present to respiratory clinic at [facility] However he did not have transportation available till 1/22.  1/22, patient presented at respiratory clinic with worsening symptoms and was noted to have oxygen saturation low at 80s.  Supplemental oxygen started and patient was sent to ED.  In the ED, patient was afebrile, hemodynamically stable. COVID-19 PCR negative, influenza A and B negative.  Inflammatory markers were not elevated. CT angio of chest did not show any acute consolidation, infarction, pleural effusion.  Patient was admitted under hospitalist medicine service for further evaluation management.  Subjective: Patient was seen and examined this morning.  Pleasant Caucasian male.  Morbidly obese.  Sitting up in chair.  Not in distress.  Remains on oxygen supplementation.  Assessment/Plan: Acute respiratory failure with hypoxia  Acute decompensated diastolic congestive heart failure. -Echocardiogram showed EF 60 to 65% with grade 2 diastolic dysfunction -Initial chest x-ray suggested multifocal pneumonia but ruled out by CT angiogram chest.  -Procalcitonin level was also less than 0.1 on admission. -Patient was placed on Lasix 40 mg IV daily.  I stopped it this morning because of worsening creatinine. -Continue to monitor breathing status.  Acute kidney injury -Creatinine elevated to 1.76 today.  Baseline less than 1.4. -It is likely due to overdiuresis.  IV Lasix held today.   Repeat BMP tomorrow.  Hypertension  -Continue metoprolol, losartan on hold.  History of DVT years ago  -patient has been continued on chronic Eliquis.  -Imaging negative for DVT and PE on this admission.    Obstructive sleep apnea  -Noncompliant with CPAP at home.  Noncompliant here.    Morbid obesity - Body mass index is 46.78 kg/m. Patient has been advised to make an attempt to improve diet and exercise patterns to aid in weight loss.  Autism spectrum disorder - his speech is slightly impaired and difficult to discern  Mobility: Encourage ambulation Diet: Cardiac diet Fluid: Avoid IV hydration. DVT prophylaxis:  Eliquis Code Status:  Full code Family Communication:  None at bedside Expected Discharge:  Hopefully home in 1 to 2 days  Consultants:  None  Procedures:    Antimicrobials: Anti-infectives (From admission, onward)   None        Code Status: Full Code   Diet Order            Diet Heart Room service appropriate? Yes with Assist; Fluid consistency: Thin  Diet effective now              Infusions:  . sodium chloride      Scheduled Meds: . apixaban  5 mg Oral BID  . aspirin EC  81 mg Oral Daily  . atorvastatin  40 mg Oral q1800  . cholecalciferol  1,000 Units Oral Daily  . gabapentin  600 mg Oral QHS  . metoprolol tartrate  12.5 mg Oral BID  . multivitamin with minerals  1 tablet Oral Daily  . pantoprazole  40 mg Oral Daily  . Ensure Max Protein  11 oz  Oral BID  . sertraline  100 mg Oral Daily  . sodium chloride flush  3 mL Intravenous Q12H  . tamsulosin  0.4 mg Oral BID  . vitamin B-12  1,000 mcg Oral Daily    PRN meds: sodium chloride, acetaminophen, pseudoephedrine **AND** acetaminophen, albuterol, ondansetron (ZOFRAN) IV, polyvinyl alcohol, sodium chloride flush   Objective: Vitals:   02/15/19 0522 02/15/19 1358  BP: 121/68 120/69  Pulse: 84 86  Resp: 18 20  Temp: 98 F (36.7 C) (!) 97.5 F (36.4 C)  SpO2: 96% 97%     Intake/Output Summary (Last 24 hours) at 02/15/2019 1705 Last data filed at 02/15/2019 1200 Gross per 24 hour  Intake 300 ml  Output 560 ml  Net -260 ml   Filed Weights   02/12/19 2017 02/13/19 0426 02/15/19 0522  Weight: (!) 169.6 kg (!) 149.1 kg (!) 147.9 kg   Weight change:  Body mass index is 46.78 kg/m.   Physical Exam: General exam: Morbidly obese male.  Sitting up in chair.  Not in distress.  Slow to respond Skin: No rashes, lesions or ulcers. HEENT: Atraumatic, normocephalic, supple neck, no obvious bleeding Lungs: Clear to auscultation bilaterally CVS: Regular rate and rhythm, no murmur GI/Abd soft, distended from obesity, bowel sound present CNS: Alert, awake, slow to respond, seems to have baseline cognitive problem with autism Psychiatry: Mood appropriate Extremities: No pedal edema, no calf tenderness  Data Review: I have personally reviewed the laboratory data and studies available.  Recent Labs  Lab 02/12/19 2037  WBC 4.4  NEUTROABS 2.8  HGB 13.4  HCT 44.1  MCV 95.5  PLT 183   Recent Labs  Lab 02/12/19 2037 02/14/19 0442 02/15/19 0440  NA 142 141 140  K 4.5 4.2 4.3  CL 107 101 100  CO2 28 33* 30  GLUCOSE 119* 108* 121*  BUN 27* 34* 49*  CREATININE 1.17 1.30* 1.76*  CALCIUM 8.4* 8.7* 8.9    Lorin Glass, MD  [facility] 02/15/2019

## 2019-02-15 NOTE — TOC Initial Note (Signed)
Transition of Care (TOC) - Initial/Assessment Note    Patient Details  Name: Phillip Butler. MRN: [id number] Date of Birth: [birth year]  Transition of Care [facility]) CM/SW Contact:    Dessa Phi, RN Phone Number: 02/15/2019, 1:01 PM  Clinical Narrative:  D/c plan home. CM referral for CHF screen-1 adm/82months-not approriate for CHF protocal.  Will follow for d/c needs.               Expected Discharge Plan: Home/Self Care Barriers to Discharge: Continued Medical Work up   Patient Goals and CMS Choice        Expected Discharge Plan and Services Expected Discharge Plan: Home/Self Care   Discharge Planning Services: CM Consult   Living arrangements for the past 2 months: Single Family Home                                      Prior Living Arrangements/Services Living arrangements for the past 2 months: Single Family Home Lives with:: Self Patient language and need for interpreter reviewed:: Yes Do you feel safe going back to the place where you live?: Yes      Need for Family Participation in Patient Care: No (Comment) Care giver support system in place?: Yes (comment)   Criminal Activity/Legal Involvement Pertinent to Current Situation/Hospitalization: No - Comment as needed  Activities of Daily Living Home Assistive Devices/Equipment: None ADL Screening (condition at time of admission) Patient's cognitive ability adequate to safely complete daily activities?: No Is the patient deaf or have difficulty hearing?: Yes Does the patient have difficulty seeing, even when wearing glasses/contacts?: No Does the patient have difficulty concentrating, remembering, or making decisions?: Yes Patient able to express need for assistance with ADLs?: Yes Does the patient have difficulty dressing or bathing?: Yes Independently performs ADLs?: Yes (appropriate for developmental age) Does the patient have difficulty walking or climbing stairs?: Yes Weakness of Legs:  None Weakness of Arms/Hands: None  Permission Sought/Granted Permission sought to share information with : Case Manager Permission granted to share information with : Yes, Verbal Permission Granted  Share Information with NAME: Case Manager           Emotional Assessment Appearance:: Appears stated age Attitude/Demeanor/Rapport: Gracious Affect (typically observed): Accepting Orientation: : Oriented to Self, Oriented to Place, Oriented to  Time, Oriented to Situation Alcohol / Substance Use: Not Applicable Psych Involvement: No (comment)  Admission diagnosis:  SOB (shortness of breath) [R06.02] Acute respiratory failure with hypoxia ([place]) [J96.01] Patient Active Problem List   Diagnosis Date Noted  . History of DVT (deep vein thrombosis) 02/13/2019  . Pre-ulcerative corn or callous 02/09/2019  . Lump in throat 02/09/2019  . Cough 01/06/2019  . Increased abdominal girth 09/29/2018  . Rash 09/29/2018  . Hematochezia 06/25/2018  . Urinary stream slowing 12/13/2017  . Plantar fasciitis, left 09/23/2017  . Conjunctivitis of left eye 06/11/2017  . Left ear hearing loss 06/11/2017  . Skin lesion 06/11/2017  . Hyperglycemia 06/11/2017  . Cellulitis 07/25/2016  . 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 Right leg pain 06/20/2016  . Left ankle pain 06/13/2016  . Acute sinus infection 04/12/2016  . RLS (restless legs syndrome) 04/12/2016  . Respiratory abnormality   . Obesity (BMI 30-39.9) 03/02/2015  . Acute on chronic diastolic CHF (congestive heart failure) ([place])   . Autism   . DVT, lower extremity, distal, acute ([place])   . Closed  trimalleolar fracture of left ankle 02/26/2015  . Hyperlipidemia 02/19/2014  . Depression with anxiety 02/19/2014  . Acute respiratory failure with hypoxia ([facility]) 02/19/2014  . Normocytic anemia 02/19/2014  . Community acquired pneumonia 02/18/2014  . Left foot pain 02/09/2013  . Urinary frequency 02/03/2012  . Bilateral hearing loss  02/03/2012  . Preventative health care 11/28/2010  . Obstructive sleep apnea 08/14/2009  . Essential hypertension 02/16/2007  . Allergic rhinitis 02/16/2007  . GERD 02/16/2007   PCP:  Corwin Levins, MD Pharmacy:   [id number] [place], [place] - [phone] [place] [address] Phone: [phone] Fax: [phone]     Social Determinants of Health (SDOH) Interventions    Readmission Risk Interventions No flowsheet data found.

## 2019-02-16 LAB — BASIC METABOLIC PANEL
Anion gap: 7 (ref 5–15)
BUN: 54 mg/dL — ABNORMAL HIGH (ref 6–20)
CO2: 31 mmol/L (ref 22–32)
Calcium: 9.1 mg/dL (ref 8.9–10.3)
Chloride: 100 mmol/L (ref 98–111)
Creatinine, Ser: 1.5 mg/dL — ABNORMAL HIGH (ref 0.61–1.24)
GFR calc Af Amer: 58 mL/min — ABNORMAL LOW (ref >60)
GFR calc non Af Amer: 50 mL/min — ABNORMAL LOW (ref >60)
Glucose, Bld: 113 mg/dL — ABNORMAL HIGH (ref 70–99)
Potassium: 4.6 mmol/L (ref 3.5–5.1)
Sodium: 138 mmol/L (ref 135–145)

## 2019-02-16 MED ORDER — FUROSEMIDE 40 MG PO TABS: 40.0000 mg | ORAL_TABLET | Freq: Every day | ORAL | 0 refills | Status: DC

## 2019-02-16 MED ORDER — METOPROLOL TARTRATE 25 MG PO TABS: 12.5000 mg | ORAL_TABLET | Freq: Two times a day (BID) | ORAL | 0 refills | Status: DC

## 2019-02-16 MED ORDER — FUROSEMIDE 10 MG/ML IJ SOLN
20.0000 mg | Freq: Once | INTRAMUSCULAR | Status: AC
  Administered 2019-02-16: 20 mg via INTRAVENOUS
  Filled 2019-02-16: qty 2

## 2019-02-16 NOTE — Progress Notes (Signed)
PROGRESS NOTE  Phillip Butler.  DOB: 23-Apr-1960  PCP: Corwin Levins, MD ZJI:[id number]  DOA: 02/12/2019 Admitted From: Home  LOS: 3 days   Chief Complaint  Patient presents with  . Shortness of Breath   Brief narrative: Phillip Butleris a 59 y.o.male with PMH significant for autism, obesity, anemia of chronic disease, hyperlipidemia, hypertension. 1/18, patient was seen by PCP for shortness of breath and cough.  Chest x-ray was obtained which showed multifocal pneumonia.  Patient was called to present to respiratory clinic at [facility] However he did not have transportation available till 1/22.  1/22, patient presented at respiratory clinic with worsening symptoms and was noted to have oxygen saturation low at 80s.  Supplemental oxygen started and patient was sent to ED.  In the ED, patient was afebrile, hemodynamically stable. COVID-19 PCR negative, influenza A and B negative.  Inflammatory markers were not elevated. CT angio of chest did not show any acute consolidation, infarction, pleural effusion.  Patient was admitted under hospitalist medicine service for further evaluation management.  Subjective: Patient was seen and examined this morning.  Pleasant Caucasian male.  Morbidly obese.  Not in distress.  Sitting up in chair.  On 3 L oxygen by nasal cannula.  Assessment/Plan: Acute respiratory failure with hypoxia Acute decompensated diastolic congestive heart failure. -Echocardiogram showed EF 60 to 65% with grade 2 diastolic dysfunction -Initial chest x-ray suggested multifocal pneumonia but ruled out by CT angiogram chest.-Procalcitonin level was also less than 0.1 on admission.  Patient was managed as CHF exacerbation. -Patient was diuresed with Lasix 40 mg IV daily.    Held yesterday because of rising creatinine.  Creatinine improved today.  I will give him 1 dose of Lasix 20 mg IV today.  Repeat creatinine tomorrow.   -Continues to remain on oxygen 3-4 lpm.   Ordered for oxygen protocol with ambulation.  Acute kidney injury -Baseline less than 1.4.  Creatinine started to rise with IV diuresis to peak at 1.76. -I held dialysis for the day yesterday, creatinine is now back at 1.5. -Continue to monitor.  Hypertension -Continue metoprolol, and Lasix.  Lisinopril on hold to avoid renal injury as well as blood pressure drop.  History of DVT years ago -patient has been continued on chronic Eliquis. -Imaging negative for DVT and PE on this admission.  Obstructive sleep apnea -Noncompliant with CPAP at home. Noncompliant here. Counseled for compliance.  Morbid obesity -Body mass index is 46.78 kg/m.Patient has been advised to make an attempt to improve diet and exercise patterns to aid in weight loss.  Autism spectrum disorder-his speech is slightly impaired and difficult to discern. Mobility: Encourage ambulation Diet: Cardiac diet Fluid: Avoid IV hydration. DVT prophylaxis:  Eliquis Code Status:  Full code Family Communication:  None at bedside Expected Discharge:  Unable to reach family despite multiple attempts.  Continue IV diuresis today. Tentative plan to discharge to home tomorrow.  Consultants:  None  Procedures:    Antimicrobials: Anti-infectives (From admission, onward)   None        Code Status: Full Code   Diet Order            Diet Heart Room service appropriate? Yes with Assist; Fluid consistency: Thin  Diet effective now              Infusions:  . sodium chloride      Scheduled Meds: . apixaban  5 mg Oral BID  . aspirin EC  81 mg Oral Daily  .  atorvastatin  40 mg Oral q1800  . cholecalciferol  1,000 Units Oral Daily  . furosemide  20 mg Intravenous Once  . gabapentin  600 mg Oral QHS  . metoprolol tartrate  12.5 mg Oral BID  . multivitamin with minerals  1 tablet Oral Daily  . pantoprazole  40 mg Oral Daily  . Ensure Max Protein  11 oz Oral BID  . sertraline  100 mg Oral Daily    . sodium chloride flush  3 mL Intravenous Q12H  . tamsulosin  0.4 mg Oral BID  . vitamin B-12  1,000 mcg Oral Daily    PRN meds: sodium chloride, acetaminophen, pseudoephedrine **AND** acetaminophen, albuterol, ondansetron (ZOFRAN) IV, polyvinyl alcohol, sodium chloride flush   Objective: Vitals:   02/16/19 0444 02/16/19 1216  BP: 135/87 126/72  Pulse: 81 79  Resp: 18 20  Temp: 99 F (37.2 C) 98 F (36.7 C)  SpO2: 97% 99%    Intake/Output Summary (Last 24 hours) at 02/16/2019 1525 Last data filed at 02/16/2019 1050 Gross per 24 hour  Intake 560 ml  Output 1000 ml  Net -440 ml   Filed Weights   02/13/19 0426 02/15/19 0522 02/16/19 0500  Weight: (!) 149.1 kg (!) 147.9 kg (!) 148.4 kg   Weight change: 0.499 kg Body mass index is 46.93 kg/m.   Physical Exam: General exam: Morbidly obese male.  Sitting up in chair.  Not in distress.  Slow to respond Skin: No rashes, lesions or ulcers. HEENT: Atraumatic, normocephalic, supple neck, no obvious bleeding Lungs: Clear to auscultation bilaterally CVS: Regular rate and rhythm, no murmur GI/Abd soft, distended from obesity, bowel sound present CNS: Alert, awake, slow to respond, seems to have baseline cognitive problem with autism Psychiatry: Mood appropriate Extremities: No pedal edema, no calf tenderness  Data Review: I have personally reviewed the laboratory data and studies available.  Recent Labs  Lab 02/12/19 2037  WBC 4.4  NEUTROABS 2.8  HGB 13.4  HCT 44.1  MCV 95.5  PLT 183   Recent Labs  Lab 02/12/19 2037 02/14/19 0442 02/15/19 0440 02/16/19 0435  NA 142 141 140 138  K 4.5 4.2 4.3 4.6  CL 107 101 100 100  CO2 28 33* 30 31  GLUCOSE 119* 108* 121* 113*  BUN 27* 34* 49* 54*  CREATININE 1.17 1.30* 1.76* 1.50*  CALCIUM 8.4* 8.7* 8.9 9.1    Terrilee Croak, MD  [facility] 02/16/2019

## 2019-02-16 NOTE — Care Management Important Message (Signed)
Important Message  Patient Details IM Letter given to Lanier Clam RN Case Manager to present to the Patient Name: Phillip Butler. MRN: [id number] Date of Birth: 1960-10-28   Medicare Important Message Given:  Yes     Caren Macadam 02/16/2019, 11:35 AM

## 2019-02-16 NOTE — Discharge Summary (Signed)
Physician Discharge Summary  [name]. ZOX:[id number] DOB: [birth year] DOA: 02/12/2019  PCP: Corwin Levins, MD  Admit date: 02/12/2019 Discharge date: 02/17/2019  Admitted From: Home Discharge disposition: Home   Code Status: Full Code  Diet Recommendation: Low-salt/cardiac diet   Recommendations for Outpatient Follow-Up:   1. Follow-up with PCP as an outpatient  Discharge Diagnosis:   Principal Problem:   Acute on chronic diastolic CHF (congestive heart failure) (HCC) Active Problems:   Obstructive sleep apnea   Essential hypertension   Acute respiratory failure with hypoxia (HCC)   Obesity (BMI 30-39.9)   History of DVT (deep vein thrombosis)    History of Present Illness / Brief narrative:  Phillip Butler.is a 59 y.o.male with PMH significant for autism, obesity, anemia of chronic disease, hyperlipidemia, hypertension. 1/18, patient was seen by PCP for shortness of breath and cough.  Chest x-ray was obtained which showed multifocal pneumonia.  Patient was called to present to respiratory clinic at [facility] However he did not have transportation available till 1/22.  1/22, patient presented at respiratory clinic with worsening symptoms and was noted to have oxygen saturation low at 80s.  Supplemental oxygen started and patient was sent to ED.  In the ED, patient was afebrile, hemodynamically stable. COVID-19 PCR negative, influenza A and B negative.  Inflammatory markers were not elevated. CT angio of chest did not show any acute consolidation, infarction, pleural effusion.  Patient was admitted under hospitalist medicine service for further evaluation management.  Hospital Course:  Acute respiratory failure with hypoxia  Acute decompensated diastolic congestive heart failure. -Echocardiogram showed EF 60 to 65% with grade 2 diastolic dysfunction -Initial chest x-ray suggested multifocal pneumonia but ruled out by CT angiogram chest.  -Procalcitonin  level was also less than 0.1 on admission.  Patient was managed as CHF exacerbation. -Patient was diuresed with Lasix 40 mg IV daily.  At discharge, I switched him to Lasix 40 mg oral daily.  -Continues to remain on oxygen 2-3 lpm.  Acute kidney injury -Baseline creatinine less than 1.4.  Creatinine started to rise with IV diuresis to peak at 1.76. -I held dialysis for the day yesterday, creatinine is now back at 1.5. -Plan to discharge to home on oral Lasix 40 mg daily.  Hypertension  -Continue metoprolol, and Lasix.  Lisinopril on hold to avoid renal injury as well as blood pressure drop.  History of DVT years ago  -patient has been continued on chronic Eliquis.  -Imaging negative for DVT and PE on this admission.    Obstructive sleep apnea  -Noncompliant with CPAP at home.  Noncompliant here.  Counseled for compliance.  Morbid obesity - Body mass index is 46.78 kg/m. Patient has been advised to make an attempt to improve diet and exercise patterns to aid in weight loss.  Autism spectrum disorder - his speech is slightly impaired and difficult to discern.  Stable for discharge to home today.  I called patient's daughter-in-law yesterday.  Subjective:  Seen and examined this morning.  Not in distress.  Sitting up in chair.  On 3 L oxygen by nasal cannula.  Discharge Exam:   Vitals:   02/16/19 1216 02/16/19 1952 02/17/19 0539 02/17/19 0635  BP: 126/72 (!) 139/95 131/73   Pulse: 79 80 70   Resp: 20 18 18    Temp: 98 F (36.7 C) 98.3 F (36.8 C) 98.7 F (37.1 C)   TempSrc: Oral Oral    SpO2: 99% 97% (!) 87% 90%  Weight:    (!) 148.5 kg  Height:        Body mass index is 46.96 kg/m.  General exam: Appears calm and comfortable.  Sitting up in chair. Skin: No rashes, lesions or ulcers. HEENT: Atraumatic, normocephalic, supple neck, no obvious bleeding Lungs: Clear to auscultation bilaterally CVS: Regular rate and rhythm, no murmur GI/Abd soft, nontender,  nondistended, bowel sound present CNS: Alert, awake, oriented x3.  He has some baseline mental issues. Psychiatry: Mood appropriate Extremities: No pedal edema, no calf tenderness  Discharge Instructions:  Wound care: None Discharge Instructions    (HEART FAILURE PATIENTS) Call MD:  Anytime you have any of the following symptoms: 1) 3 pound weight gain in 24 hours or 5 pounds in 1 week 2) shortness of breath, with or without a dry hacking cough 3) swelling in the hands, feet or stomach 4) if you have to sleep on extra pillows at night in order to breathe.   Complete by: As directed    AMB referral to CHF clinic   Complete by: As directed    Diet - low sodium heart healthy   Complete by: As directed    Increase activity slowly   Complete by: As directed      Follow-up Information    Corwin Levins, MD Follow up.   Specialties: Internal Medicine, Radiology Contact information: [address] [place] [address] [phone]          Allergies as of 02/17/2019      Reactions   Fluoxetine Hcl    REACTION: ineffective   Xanax [alprazolam]    Made patient too sleepy      Medication List    STOP taking these medications   lisinopril 20 MG tablet Commonly known as: ZESTRIL     TAKE these medications   acetaminophen 325 MG tablet Commonly known as: TYLENOL Take 650 mg by mouth every 6 (six) hours as needed for moderate pain.   albuterol 108 (90 Base) MCG/ACT inhaler Commonly known as: VENTOLIN HFA Inhale 2 puffs into the lungs every 6 (six) hours as needed for wheezing or shortness of breath.   Alka-Seltzer Pls Sinus & Cough 10-5-325 MG Caps Generic drug: DM-Phenylephrine-Acetaminophen Take 2 capsules by mouth every 8 (eight) hours as needed (congestion).   aspirin EC 81 MG tablet Take 81 mg by mouth daily.   atorvastatin 40 MG tablet Commonly known as: LIPITOR TAKE 1 TABLET BY MOUTH EVERY DAY   cholecalciferol 25 MCG (1000 UNIT) tablet Commonly known as:  VITAMIN D3 Take 1,000 Units by mouth daily.   Eliquis 5 MG Tabs tablet Generic drug: apixaban Take 5 mg by mouth 2 (two) times daily.   furosemide 40 MG tablet Commonly known as: Lasix Take 1 tablet (40 mg total) by mouth daily.   gabapentin 300 MG capsule Commonly known as: NEURONTIN TAKE 2 CAPSULES (600 MG TOTAL) BY MOUTH AT BEDTIME.   hydroxypropyl methylcellulose / hypromellose 2.5 % ophthalmic solution Commonly known as: ISOPTO TEARS / GONIOVISC Place 1 drop into both eyes 3 (three) times daily as needed for dry eyes.   metoprolol tartrate 25 MG tablet Commonly known as: LOPRESSOR Take 0.5 tablets (12.5 mg total) by mouth 2 (two) times daily.   omeprazole 20 MG capsule Commonly known as: PRILOSEC TAKE 1 CAPSULE (20 MG TOTAL) BY MOUTH DAILY. What changed: See the new instructions.   sertraline 100 MG tablet Commonly known as: ZOLOFT TAKE 1 TABLET BY MOUTH EVERY DAY  tamsulosin 0.4 MG Caps capsule Commonly known as: FLOMAX Take 1 capsule (0.4 mg total) by mouth 2 (two) times daily.   vitamin B-12 1000 MCG tablet Commonly known as: CYANOCOBALAMIN Take 1,000 mcg by mouth daily.            Durable Medical Equipment  (From admission, onward)         Start     Ordered   02/17/19 1044  DME Oxygen  Once    Question Answer Comment  Length of Need Lifetime   Mode or (Route) Nasal cannula   Liters per Minute 3   Frequency Continuous (stationary and portable oxygen unit needed)   Oxygen conserving device Yes   Oxygen delivery system Gas      02/17/19 1044          Time coordinating discharge: 35 minutes  The results of significant diagnostics from this hospitalization (including imaging, microbiology, ancillary and laboratory) are listed below for reference.    Procedures and Diagnostic Studies:   CT Angio Chest PE W and/or Wo Contrast  Result Date: 02/13/2019 CLINICAL DATA:  Shortness of breath and positive D-dimer EXAM: CT ANGIOGRAPHY CHEST WITH  CONTRAST TECHNIQUE: Multidetector CT imaging of the chest was performed using the standard protocol during bolus administration of intravenous contrast. Multiplanar CT image reconstructions and MIPs were obtained to evaluate the vascular anatomy. CONTRAST:  OMNIPAQUE IOHEXOL 350 MG/ML SOLN COMPARISON:  None. FINDINGS: Cardiovascular: Suboptimal opacification of the pulmonary arteries due to bolus timing, limiting evaluation beyond the lobar branches. Within that limitation, there is no central pulmonary embolus. The size of the main pulmonary artery is normal. Heart size is normal, with no pericardial effusion. The course and caliber of the aorta are normal. There is mild atherosclerotic calcification. Opacification decreased due to pulmonary arterial phase contrast bolus timing. There are coronary artery calcifications. Mediastinum/Nodes: No mediastinal, hilar or axillary lymphadenopathy. The visualized thyroid and thoracic esophageal course are unremarkable. Lungs/Pleura: Airways are patent. No focal consolidation, pulmonary infarct or pleural effusion. Upper Abdomen: Contrast bolus timing is not optimized for evaluation of the abdominal organs. The visualized portions of the organs of the upper abdomen are normal. Musculoskeletal: No chest wall abnormality. No bony spinal canal stenosis. Review of the MIP images confirms the above findings. IMPRESSION: 1. Suboptimal opacification of the pulmonary arteries due to bolus timing. Within that limitation, there is no central pulmonary embolus. 2. No acute thoracic abnormality. Aortic Atherosclerosis (ICD10-I70.0). Electronically Signed   By: Deatra Robinson M.D.   On: 02/13/2019 00:53   DG Chest Port 1 View  Result Date: 02/12/2019 CLINICAL DATA:  59 year old male with shortness of breath. EXAM: PORTABLE CHEST 1 VIEW COMPARISON:  Chest radiograph dated 02/09/2019 FINDINGS: Ill-defined hazy density in the peripheral/subpleural aspect the left mid to lower lung  field similar to prior radiograph. The right lung is clear. There is no pleural effusion or pneumothorax. There is cardiomegaly. Acute osseous pathology. IMPRESSION: Left mid to lower lung field subpleural atelectasis or infiltrate. Clinical correlation is recommended. Electronically Signed   By: Elgie Collard M.D.   On: 02/12/2019 21:15   ECHOCARDIOGRAM COMPLETE  Result Date: 02/13/2019   ECHOCARDIOGRAM REPORT   Patient Name:   Phillip Butler. Date of Exam: 02/13/2019 Medical Rec #:  [id number]           Height:       70.0 in Accession #:    [id number]          Weight:  328.7 lb Date of Birth:  13-May-1960          BSA:          2.58 m Patient Age:    58 years            BP:           141/84 mmHg Patient Gender: M                   HR:           93 bpm. Exam Location:  Inpatient Procedure: 2D Echo and Intracardiac Opacification Agent Indications:    CHF-Acute Diastolic  History:        Patient has prior history of Echocardiogram examinations, most                 recent 03/04/2015. CHF; Risk Factors:Sleep Apnea and                 Hypertension. H/o DVT.  Sonographer:    Ross LudwigArthur Guy RDCS (AE) Referring Phys: [phone] JARED M GARDNER  Sonographer Comments: Technically difficult study due to poor echo windows and patient is morbidly obese. Image acquisition challenging due to patient body habitus. Image acquisition challenging due to extreme variation in respiratory motion. Patient asleep and snoring during echocardiogram. IMPRESSIONS  1. Left ventricular ejection fraction, by visual estimation, is 60 to 65%. The left ventricle has normal function. Respiratory leftward septal shift is seen, differential diagnosis includes acute pulmonary process vs. constrictive pericarditis.  2. Left ventricular diastolic parameters are consistent with Grade II diastolic dysfunction (pseudonormalization).  3. The right ventricle is not well visualized for size or function. Overall, function appears largely preserved. Some  views suggest at least mild RV enlargement.  4. The mitral valve is grossly normal. No evidence of mitral valve regurgitation. No evidence of mitral stenosis.  5. The tricuspid valve is normal in structure. Tricuspid valve regurgitation is trivial.  6. The aortic valve is grossly normal. Aortic valve regurgitation is not visualized. Mild aortic valve sclerosis without stenosis.  7. The pulmonic valve was not well visualized. Pulmonic valve regurgitation is trivial. FINDINGS  Left Ventricle: Left ventricular ejection fraction, by visual estimation, is 60 to 65%. The left ventricle has normal function. Definity contrast agent was given IV to delineate the left ventricular endocardial borders. The left ventricle is not well visualized. Left ventricular diastolic parameters are consistent with Grade II diastolic dysfunction (pseudonormalization). Normal left atrial pressure. Right Ventricle: The right ventricular size is not well visualized. Right vetricular wall thickness was not assessed. Global RV systolic function is was not well visualized. Left Atrium: Left atrial size was normal in size. Right Atrium: Right atrial size was normal in size Pericardium: There is no evidence of pericardial effusion. Mitral Valve: The mitral valve is grossly normal. No evidence of mitral valve regurgitation. No evidence of mitral valve stenosis by observation. MV peak gradient, 2.5 mmHg. Tricuspid Valve: The tricuspid valve is normal in structure. Tricuspid valve regurgitation is trivial. Aortic Valve: The aortic valve is grossly normal. Aortic valve regurgitation is not visualized. Mild aortic valve sclerosis is present, with no evidence of aortic valve stenosis. Aortic valve mean gradient measures 4.0 mmHg. Aortic valve peak gradient measures 9.0 mmHg. Aortic valve area, by VTI measures 2.86 cm. Pulmonic Valve: The pulmonic valve was not well visualized. Pulmonic valve regurgitation is trivial. Pulmonic regurgitation is trivial.  Aorta: The aortic root was not well visualized. Venous: The inferior vena cava was not  well visualized. IAS/Shunts: The interatrial septum was not well visualized.  LEFT VENTRICLE PLAX 2D LVOT diam:     1.90 cm  Diastology LVOT Area:     2.84 cm LV e' lateral:   12.30 cm/s                         LV E/e' lateral: 6.7                         LV e' medial:    6.85 cm/s                         LV E/e' medial:  12.1  RIGHT VENTRICLE RV Basal diam:  3.10 cm RV S prime:     13.60 cm/s TAPSE (M-mode): 3.1 cm LEFT ATRIUM             Index       RIGHT ATRIUM           Index LA Vol (A2C):   68.7 ml 26.65 ml/m RA Area:     16.70 cm LA Vol (A4C):   47.4 ml 18.39 ml/m RA Volume:   42.60 ml  16.52 ml/m LA Biplane Vol: 58.2 ml 22.58 ml/m  AORTIC VALVE AV Area (Vmax):    2.04 cm AV Area (Vmean):   2.20 cm AV Area (VTI):     2.86 cm AV Vmax:           150.00 cm/s AV Vmean:          99.100 cm/s AV VTI:            0.243 m AV Peak Grad:      9.0 mmHg AV Mean Grad:      4.0 mmHg LVOT Vmax:         108.00 cm/s LVOT Vmean:        77.000 cm/s LVOT VTI:          0.245 m LVOT/AV VTI ratio: 1.01  AORTA Ao Root diam: 3.00 cm MITRAL VALVE MV Area (PHT): 2.55 cm             SHUNTS MV Peak grad:  2.5 mmHg             Systemic VTI:  0.24 m MV Mean grad:  1.0 mmHg             Systemic Diam: 1.90 cm MV Vmax:       0.80 m/s MV Vmean:      52.5 cm/s MV VTI:        0.24 m MV PHT:        86.42 msec MV Decel Time: 298 msec MV E velocity: 82.60 cm/s 103 cm/s MV A velocity: 64.80 cm/s 70.3 cm/s MV E/A ratio:  1.27       1.5  Weston Brass MD Electronically signed by Weston Brass MD Signature Date/Time: 02/13/2019/12:29:54 PM    Final    VAS [place] LOWER EXTREMITY VENOUS (DVT)  Result Date: 02/15/2019  Lower Venous Study Indications: Swelling.  Risk Factors: None identified. Limitations: Body habitus and poor ultrasound/tissue interface. Comparison Study: No prior studies. Performing Technologist: Chanda Busing RVT  Examination Guidelines: A  complete evaluation includes B-mode imaging, spectral Doppler, color Doppler, and power Doppler as needed of all accessible portions of each vessel. Bilateral testing is considered an integral part of a complete examination. Limited examinations for reoccurring  indications may be performed as noted.  +---------+---------------+---------+-----------+----------+--------------+  RIGHT     Compressibility Phasicity Spontaneity Properties Thrombus Aging  +---------+---------------+---------+-----------+----------+--------------+  CFV       Full            Yes       Yes                                    +---------+---------------+---------+-----------+----------+--------------+  SFJ       Full                                                             +---------+---------------+---------+-----------+----------+--------------+  FV Prox   Full                                                             +---------+---------------+---------+-----------+----------+--------------+  FV Mid    Full                                                             +---------+---------------+---------+-----------+----------+--------------+  FV Distal Full                                                             +---------+---------------+---------+-----------+----------+--------------+  PFV       Full                                                             +---------+---------------+---------+-----------+----------+--------------+  POP       Full            Yes       Yes                                    +---------+---------------+---------+-----------+----------+--------------+  PTV       Full                                                             +---------+---------------+---------+-----------+----------+--------------+  PERO      Full                                                             +---------+---------------+---------+-----------+----------+--------------+    +---------+---------------+---------+-----------+----------+--------------+  LEFT      Compressibility Phasicity Spontaneity Properties Thrombus Aging  +---------+---------------+---------+-----------+----------+--------------+  CFV       Full            Yes       Yes                                    +---------+---------------+---------+-----------+----------+--------------+  SFJ       Full                                                             +---------+---------------+---------+-----------+----------+--------------+  FV Prox   Full                                                             +---------+---------------+---------+-----------+----------+--------------+  FV Mid    Full                                                             +---------+---------------+---------+-----------+----------+--------------+  FV Distal Full                                                             +---------+---------------+---------+-----------+----------+--------------+  PFV       Full                                                             +---------+---------------+---------+-----------+----------+--------------+  POP       Full            Yes       Yes                                    +---------+---------------+---------+-----------+----------+--------------+  PTV       Full                                                             +---------+---------------+---------+-----------+----------+--------------+  PERO      Full                                                             +---------+---------------+---------+-----------+----------+--------------+  Summary: Right: There is no evidence of deep vein thrombosis in the lower extremity. No cystic structure found in the popliteal fossa. Left: There is no evidence of deep vein thrombosis in the lower extremity. No cystic structure found in the popliteal fossa.  *See table(s) above for measurements and observations. Electronically signed by  Monica Martinez MD on 02/15/2019 at 4:34:14 PM.    Final      Labs:   Basic Metabolic Panel: Recent Labs  Lab 02/12/19 2037 02/12/19 2037 02/14/19 5732 02/14/19 0442 02/15/19 0440 02/15/19 0440 02/16/19 0435 02/17/19 0314  NA 142  --  141  --  140  --  138 140  K 4.5   < > 4.2   < > 4.3   < > 4.6 4.2  CL 107  --  101  --  100  --  100 100  CO2 28  --  33*  --  30  --  31 32  GLUCOSE 119*  --  108*  --  121*  --  113* 103*  BUN 27*  --  34*  --  49*  --  54* 50*  CREATININE 1.17  --  1.30*  --  1.76*  --  1.50* 1.30*  CALCIUM 8.4*  --  8.7*  --  8.9  --  9.1 8.9   < > = values in this interval not displayed.   GFR Estimated Creatinine Clearance: 89.3 mL/min (A) (by C-G formula based on SCr of 1.3 mg/dL (H)). Liver Function Tests: Recent Labs  Lab 02/12/19 2037  AST 31  ALT 27  ALKPHOS 99  BILITOT 0.5  PROT 7.5  ALBUMIN 3.6   No results for input(s): LIPASE, AMYLASE in the last 168 hours. No results for input(s): AMMONIA in the last 168 hours. Coagulation profile No results for input(s): INR, PROTIME in the last 168 hours.  CBC: Recent Labs  Lab 02/12/19 2037  WBC 4.4  NEUTROABS 2.8  HGB 13.4  HCT 44.1  MCV 95.5  PLT 183   Cardiac Enzymes: No results for input(s): CKTOTAL, CKMB, CKMBINDEX, TROPONINI in the last 168 hours. BNP: Invalid input(s): POCBNP CBG: No results for input(s): GLUCAP in the last 168 hours. D-Dimer No results for input(s): DDIMER in the last 72 hours. Hgb A1c No results for input(s): HGBA1C in the last 72 hours. Lipid Profile No results for input(s): CHOL, HDL, LDLCALC, TRIG, CHOLHDL, LDLDIRECT in the last 72 hours. Thyroid function studies No results for input(s): TSH, T4TOTAL, T3FREE, THYROIDAB in the last 72 hours.  Invalid input(s): FREET3 Anemia work up No results for input(s): VITAMINB12, FOLATE, FERRITIN, TIBC, IRON, RETICCTPCT in the last 72 hours. Microbiology Recent Results (from the past 240 hour(s))  Novel  Coronavirus, NAA ([facility])     Status: None   Collection Time: 02/12/19  6:45 PM   Specimen: Nasopharyngeal(NP) swabs in vial transport medium   NASOPHARYNGE  IS THIS  Result Value Ref Range Status   SARS-CoV-2, NAA Not Detected Not Detected Final    Comment: This nucleic acid amplification test was developed and its performance characteristics determined by [employer]. Nucleic acid amplification tests include RT-PCR and TMA. This test has not been FDA cleared or approved. This test has been authorized by FDA under an Emergency Use Authorization (EUA). This test is only authorized for the duration of time the declaration that circumstances exist justifying the authorization of the emergency use of in vitro diagnostic tests for detection of SARS-CoV-2 virus and/or diagnosis of  COVID-19 infection under section 564(b)(1) of the Act, 21 U.S.C. 914NWG-9([id number]-3(b) (1), unless the authorization is terminated or revoked sooner. When diagnostic testing is negative, the possibility of a false negative result should be considered in the context of a patient's recent exposures and the presence of clinical signs and symptoms consistent with COVID-19. An individual without symptoms of COVID-19 and who is not shedding SARS-CoV-2 virus wo uld expect to have a negative (not detected) result in this assay.   Blood Culture (routine x 2)     Status: None (Preliminary result)   Collection Time: 02/12/19  8:37 PM   Specimen: BLOOD  Result Value Ref Range Status   Specimen Description   Final    BLOOD RIGHT HAND Performed at [facility], [address] [place], [place] [address]    Special Requests   Final    BOTTLES DRAWN AEROBIC AND ANAEROBIC Blood Culture results may not be optimal due to an inadequate volume of blood received in culture bottles Performed at [facility], [address] [place], [place] [address]    Culture   Final    NO GROWTH 3  DAYS Performed at [facility], [address] [place], [place] [address]    Report Status PENDING  Incomplete  Respiratory Panel by RT PCR (Flu A&B, Covid) - Nasopharyngeal Swab     Status: None   Collection Time: 02/12/19  8:45 PM   Specimen: Nasopharyngeal Swab  Result Value Ref Range Status   SARS Coronavirus 2 by RT PCR NEGATIVE NEGATIVE Final    Comment: (NOTE) SARS-CoV-2 target nucleic acids are NOT DETECTED. The SARS-CoV-2 RNA is generally detectable in upper respiratoy specimens during the acute phase of infection. The lowest concentration of SARS-CoV-2 viral copies this assay can detect is 131 copies/mL. A negative result does not preclude SARS-Cov-2 infection and should not be used as the sole basis for treatment or other patient management decisions. A negative result may occur with  improper specimen collection/handling, submission of specimen other than nasopharyngeal swab, presence of viral mutation(s) within the areas targeted by this assay, and inadequate number of viral copies (<131 copies/mL). A negative result must be combined with clinical observations, patient history, and epidemiological information. The expected result is Negative. Fact Sheet for Patients:  https://www.moore.com/https://www.fda.gov/media/142436/download Fact Sheet for Healthcare Providers:  https://www.young.biz/https://www.fda.gov/media/142435/download This test is not yet ap proved or cleared by the [place] FDA and  has been authorized for detection and/or diagnosis of SARS-CoV-2 by FDA under an Emergency Use Authorization (EUA). This EUA will remain  in effect (meaning this test can be used) for the duration of the COVID-19 declaration under Section 564(b)(1) of the Act, 21 U.S.C. section 360bbb-3(b)(1), unless the authorization is terminated or revoked sooner.    Influenza A by PCR NEGATIVE NEGATIVE Final   Influenza B by PCR NEGATIVE NEGATIVE Final    Comment: (NOTE) The Xpert Xpress SARS-CoV-2/FLU/RSV assay is  intended as an aid in  the diagnosis of influenza from Nasopharyngeal swab specimens and  should not be used as a sole basis for treatment. Nasal washings and  aspirates are unacceptable for Xpert Xpress SARS-CoV-2/FLU/RSV  testing. Fact Sheet for Patients: https://www.moore.com/https://www.fda.gov/media/142436/download Fact Sheet for Healthcare Providers: https://www.young.biz/https://www.fda.gov/media/142435/download This test is not yet approved or cleared by the [place] FDA and  has been authorized for detection and/or diagnosis of SARS-CoV-2 by  FDA under an Emergency Use Authorization (EUA). This EUA will remain  in effect (meaning this test can  be used) for the duration of the  Covid-19 declaration under Section 564(b)(1) of the Act, 21  U.S.C. section 360bbb-3(b)(1), unless the authorization is  terminated or revoked. Performed at [facility], [address] [place], [place] [address]   Blood Culture (routine x 2)     Status: None (Preliminary result)   Collection Time: 02/12/19  8:49 PM   Specimen: BLOOD  Result Value Ref Range Status   Specimen Description   Final    BLOOD RIGHT HAND Performed at [facility], [address], [place], [place] [address]    Special Requests   Final    BOTTLES DRAWN AEROBIC AND ANAEROBIC Blood Culture results may not be optimal due to an inadequate volume of blood received in culture bottles Performed at [facility], [address] [place], [place] [address]    Culture   Final    NO GROWTH 3 DAYS Performed at [facility], [address], [place], [place] [address]    Report Status PENDING  Incomplete    Please note: You were cared for by a hospitalist during your hospital stay. Once you are discharged, your primary care physician will handle any further medical issues. Please note that NO REFILLS for any discharge medications will be authorized once you are discharged, as it is imperative that you return to  your primary care physician (or establish a relationship with a primary care physician if you do not have one) for your post hospital discharge needs so that they can reassess your need for medications and monitor your lab values.  Signed: Lorin Glass  [facility] 02/17/2019, 10:44 AM

## 2019-02-17 LAB — BASIC METABOLIC PANEL
Anion gap: 8 (ref 5–15)
BUN: 50 mg/dL — ABNORMAL HIGH (ref 6–20)
CO2: 32 mmol/L (ref 22–32)
Calcium: 8.9 mg/dL (ref 8.9–10.3)
Chloride: 100 mmol/L (ref 98–111)
Creatinine, Ser: 1.3 mg/dL — ABNORMAL HIGH (ref 0.61–1.24)
GFR calc Af Amer: >60 mL/min (ref >60)
GFR calc non Af Amer: 60 mL/min — ABNORMAL LOW (ref >60)
Glucose, Bld: 103 mg/dL — ABNORMAL HIGH (ref 70–99)
Potassium: 4.2 mmol/L (ref 3.5–5.1)
Sodium: 140 mmol/L (ref 135–145)

## 2019-02-17 MED ORDER — FUROSEMIDE 10 MG/ML IJ SOLN
40.0000 mg | Freq: Once | INTRAMUSCULAR | Status: AC
  Administered 2019-02-17: 40 mg via INTRAVENOUS
  Filled 2019-02-17: qty 4

## 2019-02-17 NOTE — TOC Transition Note (Signed)
Transition of Care [facility]) - CM/SW Discharge Note   Patient Details  Name: Phillip Butler. MRN: [id number] Date of Birth: 1960-10-26  Transition of [facility]) CM/SW Contact:  Lanier Clam, RN Phone Number: 02/17/2019, 10:58 AM   Clinical Narrative: 02 sats qualify for ome 02;order placed;Zach Adampt health rep aware of order-will deliver travel tank to rm prior d/c. Has own transport home.No further CM needs.      Final next level of care: Home/Self Care Barriers to Discharge: No Barriers Identified   Patient Goals and CMS Choice        Discharge Placement                       Discharge Plan and Services   Discharge Planning Services: CM Consult            DME Arranged: Oxygen DME Agency: [employer] Date DME Agency Contacted: 02/17/19 Time DME Agency Contacted: 1058 Representative spoke with at DME Agency: Ian Malkin            Social Determinants of Health (SDOH) Interventions     Readmission Risk Interventions No flowsheet data found.

## 2019-02-17 NOTE — Progress Notes (Signed)
SATURATION QUALIFICATIONS: (This note is used to comply with regulatory documentation for home oxygen)  Patient Saturations on Room Air at Rest = 87%  Patient Saturations on 2 Liters of oxygen while Ambulating = 95%  Please briefly explain why patient needs home oxygen: patient has failed other alternative methods

## 2019-02-18 LAB — CULTURE, BLOOD (ROUTINE X 2)
Culture: NO GROWTH
Culture: NO GROWTH

## 2019-02-23 ENCOUNTER — Telehealth: Payer: Self-pay | Admitting: Internal Medicine

## 2019-02-23 NOTE — Telephone Encounter (Signed)
    Brother in Social worker (Rociero,Roberto)calling to request order for digital scale and pulse oximeter. Rociero declined to purchase from local pharmacy, wants order to written to possibly obtain at lower cost

## 2019-02-23 NOTE — Telephone Encounter (Signed)
Sorry, but unless pt knows they are covered by the insurance (by calling them), I would not prescribe this, as this is extremely unusual

## 2019-02-23 NOTE — Telephone Encounter (Signed)
Called patients Brother in law told him the message

## 2019-02-23 NOTE — Telephone Encounter (Signed)
Please advise 

## 2019-03-04 ENCOUNTER — Ambulatory Visit: Payer: Medicare Other | Admitting: *Deleted

## 2019-03-04 ENCOUNTER — Ambulatory Visit (INDEPENDENT_AMBULATORY_CARE_PROVIDER_SITE_OTHER): Payer: Medicare Other | Admitting: *Deleted

## 2019-03-04 ENCOUNTER — Other Ambulatory Visit: Payer: Self-pay

## 2019-03-04 DIAGNOSIS — Z23 Encounter for immunization: Secondary | ICD-10-CM | POA: Diagnosis not present

## 2019-03-07 ENCOUNTER — Emergency Department (HOSPITAL_COMMUNITY)
Admission: EM | Admit: 2019-03-07 | Discharge: 2019-03-07 | Disposition: A | Discharge status: Home / Self Care | Payer: Medicare Other | Attending: Emergency Medicine | Admitting: Emergency Medicine

## 2019-03-07 ENCOUNTER — Emergency Department (HOSPITAL_COMMUNITY): Payer: Medicare Other

## 2019-03-07 DIAGNOSIS — Z79899 Other long term (current) drug therapy: Secondary | ICD-10-CM | POA: Diagnosis not present

## 2019-03-07 DIAGNOSIS — Z9981 Dependence on supplemental oxygen: Secondary | ICD-10-CM | POA: Insufficient documentation

## 2019-03-07 DIAGNOSIS — Z7901 Long term (current) use of anticoagulants: Secondary | ICD-10-CM | POA: Insufficient documentation

## 2019-03-07 DIAGNOSIS — I110 Hypertensive heart disease with heart failure: Secondary | ICD-10-CM | POA: Diagnosis not present

## 2019-03-07 DIAGNOSIS — R0602 Shortness of breath: Secondary | ICD-10-CM | POA: Insufficient documentation

## 2019-03-07 DIAGNOSIS — I10 Essential (primary) hypertension: Secondary | ICD-10-CM | POA: Diagnosis not present

## 2019-03-07 DIAGNOSIS — I5032 Chronic diastolic (congestive) heart failure: Secondary | ICD-10-CM | POA: Insufficient documentation

## 2019-03-07 LAB — CBC WITH DIFFERENTIAL/PLATELET
Abs Immature Granulocytes: 0.06 10*3/uL (ref 0.00–0.07)
Basophils Absolute: 0 10*3/uL (ref 0.0–0.1)
Basophils Relative: 1 %
Eosinophils Absolute: 0.3 10*3/uL (ref 0.0–0.5)
Eosinophils Relative: 5 %
HCT: 38 % — ABNORMAL LOW (ref 39.0–52.0)
Hemoglobin: 11.9 g/dL — ABNORMAL LOW (ref 13.0–17.0)
Immature Granulocytes: 1 %
Lymphocytes Relative: 16 %
Lymphs Abs: 1.1 10*3/uL (ref 0.7–4.0)
MCH: 30.1 pg (ref 26.0–34.0)
MCHC: 31.3 g/dL (ref 30.0–36.0)
MCV: 96.2 fL (ref 80.0–100.0)
Monocytes Absolute: 0.5 10*3/uL (ref 0.1–1.0)
Monocytes Relative: 8 %
Neutro Abs: 4.7 10*3/uL (ref 1.7–7.7)
Neutrophils Relative %: 69 %
Platelets: 236 10*3/uL (ref 150–400)
RBC: 3.95 MIL/uL — ABNORMAL LOW (ref 4.22–5.81)
RDW: 13.5 % (ref 11.5–15.5)
WBC: 6.7 10*3/uL (ref 4.0–10.5)
nRBC: 0 % (ref 0.0–0.2)

## 2019-03-07 LAB — BASIC METABOLIC PANEL
Anion gap: 8 (ref 5–15)
BUN: 29 mg/dL — ABNORMAL HIGH (ref 6–20)
CO2: 29 mmol/L (ref 22–32)
Calcium: 9 mg/dL (ref 8.9–10.3)
Chloride: 104 mmol/L (ref 98–111)
Creatinine, Ser: 1.13 mg/dL (ref 0.61–1.24)
GFR calc Af Amer: >60 mL/min (ref >60)
GFR calc non Af Amer: >60 mL/min (ref >60)
Glucose, Bld: 115 mg/dL — ABNORMAL HIGH (ref 70–99)
Potassium: 4.1 mmol/L (ref 3.5–5.1)
Sodium: 141 mmol/L (ref 135–145)

## 2019-03-07 LAB — BRAIN NATRIURETIC PEPTIDE: B Natriuretic Peptide: 26.4 pg/mL (ref 0.0–100.0)

## 2019-03-07 NOTE — ED Provider Notes (Signed)
Care assumed from [facility], PA-C at shift change pending discharge when patient's power is back on. Patient is currently on oxygen at home (1L [place]) and doesn't have power to charge his oxygen. His power is suppose to come back on later this afternoon.   5:28 PM Informed by RN that patient's brother is on his way to pick patient up. Patient discharged in stable condition.    Phillip Butler 03/07/19 1729    Glynn Octave, MD 03/08/19 [phone]

## 2019-03-07 NOTE — ED Provider Notes (Signed)
[facility]-EMERGENCY DEPT Provider Note   CSN: [id number] Arrival date & time: 03/07/19  0645     History Chief Complaint  Patient presents with  . Shortness of Breath    Phillip Butler. is a 59 y.o. 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 Patient states he has been out of town for the last 24 hours and ran out of his home O2. He usually wears 1 L.  He is endorsing shortness of breath for the last several days.  Of note patient had to walk almost a mile to get to EMS because of downed power lines.  He denies any fever, chills, chest pain, abdominal pain, nausea, vomiting, urinary symptoms, diarrhea.  Patient lives with his brother.   Chart review shows patient was recently admitted for acute respiratory failure with hypoxia from 02/12/2019/02/17/2019.  Past Medical History:  Diagnosis Date  . Allergic rhinitis   . Anemia of chronic disease   . Anxiety   . Asthma   . Autism   . 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 Depression   . Gallstone   . GERD (gastroesophageal reflux disease)   . Hyperlipidemia   . Hypertension   . IBS (irritable bowel syndrome)   . Obesity   . OSA (obstructive sleep apnea)   . Pneumonia   . UTI (urinary tract infection)     Patient Active Problem List   Diagnosis Date Noted  . History of DVT (deep vein thrombosis) 02/13/2019  . Pre-ulcerative corn or callous 02/09/2019  . Lump in throat 02/09/2019  . Cough 01/06/2019  . Increased abdominal girth 09/29/2018  . Rash 09/29/2018  . Hematochezia 06/25/2018  . Urinary stream slowing 12/13/2017  . Plantar fasciitis, left 09/23/2017  . Conjunctivitis of left eye 06/11/2017  . Left ear hearing loss 06/11/2017  . Skin lesion 06/11/2017  . Hyperglycemia 06/11/2017    . Cellulitis 07/25/2016  . 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 Right leg pain 06/20/2016  . Left ankle pain 06/13/2016  . Acute sinus infection 04/12/2016  . RLS (restless legs syndrome) 04/12/2016  . Respiratory abnormality   . Obesity (BMI 30-39.9) 03/02/2015  . Acute on chronic diastolic CHF (congestive heart failure) (HCC)   . Autism   . DVT, lower extremity, distal, acute (HCC)   . Closed trimalleolar fracture of left ankle 02/26/2015  . Hyperlipidemia 02/19/2014  . Depression with anxiety 02/19/2014  . Acute respiratory failure with hypoxia (HCC) 02/19/2014  . Normocytic anemia 02/19/2014  . Community acquired pneumonia 02/18/2014  . Left foot pain 02/09/2013  . Urinary frequency 02/03/2012  . Bilateral hearing loss 02/03/2012  . Preventative health care 11/28/2010  . Obstructive sleep apnea 08/14/2009  . Essential hypertension 02/16/2007  . Allergic rhinitis 02/16/2007  . GERD 02/16/2007    Past Surgical History:  Procedure Laterality Date  . ORIF ANKLE FRACTURE Left 03/02/2015   Procedure: OPEN REDUCTION INTERNAL FIXATION (ORIF) LEFT ANKLE TRIMALLEOLAR FRACTURE;  Surgeon: Toni Arthurs, MD;  Location: [facility] OR;  Service: Orthopedics;  Laterality: Left;       Family History  Problem Relation Age of Onset  . Irritable bowel syndrome Mother   . Heart disease Father   . Alcohol abuse Other   . Arthritis Other   . Breast cancer Other   .  Prostate cancer Other   . Hyperlipidemia Other   . Heart disease Other   . Stroke Other   . Kidney disease Other   . Diabetes Other   . Pancreatic cancer Other   . Colon cancer Neg Hx   . Esophageal cancer Neg Hx     Social History   Tobacco Use  . Smoking status: Never Smoker  . Smokeless tobacco: Never Used  Substance Use Topics  . Alcohol use: No  . Drug use: No    Home Medications Prior to Admission medications   Medication Sig Start Date End Date Taking? Authorizing Provider  acetaminophen  (TYLENOL) 325 MG tablet Take 650 mg by mouth every 6 (six) hours as needed for moderate pain.    [provider]  albuterol (VENTOLIN HFA) 108 (90 Base) MCG/ACT inhaler Inhale 2 puffs into the lungs every 6 (six) hours as needed for wheezing or shortness of breath. 06/25/18   Corwin Levins, MD  apixaban (ELIQUIS) 5 MG TABS tablet Take 5 mg by mouth 2 (two) times daily.    [provider]  aspirin EC 81 MG tablet Take 81 mg by mouth daily.    [provider]  atorvastatin (LIPITOR) 40 MG tablet TAKE 1 TABLET BY MOUTH EVERY DAY Patient taking differently: Take 40 mg by mouth daily.  01/08/19   Corwin Levins, MD  cholecalciferol (VITAMIN D3) 25 MCG (1000 UNIT) tablet Take 1,000 Units by mouth daily.    [provider]  DM-Phenylephrine-Acetaminophen (ALKA-SELTZER PLS SINUS & COUGH) 10-5-325 MG CAPS Take 2 capsules by mouth every 8 (eight) hours as needed (congestion).    [provider]  furosemide (LASIX) 40 MG tablet Take 1 tablet (40 mg total) by mouth daily. 02/16/19 05/17/19  Lorin Glass, MD  gabapentin (NEURONTIN) 300 MG capsule TAKE 2 CAPSULES (600 MG TOTAL) BY MOUTH AT BEDTIME. 01/22/19   Corwin Levins, MD  hydroxypropyl methylcellulose / hypromellose (ISOPTO TEARS / GONIOVISC) 2.5 % ophthalmic solution Place 1 drop into both eyes 3 (three) times daily as needed for dry eyes.    [provider]  metoprolol tartrate (LOPRESSOR) 25 MG tablet Take 0.5 tablets (12.5 mg total) by mouth 2 (two) times daily. 02/16/19 05/17/19  Dahal, Melina Schools, MD  omeprazole (PRILOSEC) 20 MG capsule TAKE 1 CAPSULE (20 MG TOTAL) BY MOUTH DAILY. Patient taking differently: Take 20 mg by mouth daily.  10/26/18   Corwin Levins, MD  sertraline (ZOLOFT) 100 MG tablet TAKE 1 TABLET BY MOUTH EVERY DAY Patient taking differently: Take 100 mg by mouth daily.  01/17/19   Corwin Levins, MD  tamsulosin (FLOMAX) 0.4 MG CAPS capsule Take 1 capsule (0.4 mg total) by mouth 2 (two) times  daily. 02/09/19   Corwin Levins, MD  vitamin B-12 (CYANOCOBALAMIN) 1000 MCG tablet Take 1,000 mcg by mouth daily.    [provider]    Allergies    Fluoxetine hcl and Xanax [alprazolam]  Review of Systems   Review of Systems  All other systems are reviewed and are negative for acute change except as noted in the HPI.   Physical Exam Updated Vital Signs BP 111/80   Pulse 74   Temp 97.9 F (36.6 C) (Oral)   Resp 18   SpO2 97%   Physical Exam Vitals and nursing note reviewed.  Constitutional:      General: He is not in acute distress.    Appearance: He is not ill-appearing.  HENT:  Head: Normocephalic and atraumatic.     Right Ear: Tympanic membrane and external ear normal.     Left Ear: Tympanic membrane and external ear normal.     Nose: Nose normal.     Mouth/Throat:     Mouth: Mucous membranes are moist.     Pharynx: Oropharynx is clear.  Eyes:     General: No scleral icterus.       Right eye: No discharge.        Left eye: No discharge.     Extraocular Movements: Extraocular movements intact.     Conjunctiva/sclera: Conjunctivae normal.     Pupils: Pupils are equal, round, and reactive to light.  Neck:     Vascular: No JVD.  Cardiovascular:     Rate and Rhythm: Normal rate and regular rhythm.     Pulses: Normal pulses.          Radial pulses are 2+ on the right side and 2+ on the left side.     Heart sounds: Normal heart sounds.  Pulmonary:     Comments: Lungs clear to auscultation in all fields. Symmetric chest rise. No wheezing, rales, or rhonchi. Patient wearing 1L  and SpO2 96%.    Chest:     Chest wall: No tenderness.  Abdominal:     Comments: Abdomen is soft, non-distended, and non-tender in all quadrants. No rigidity, no guarding. No peritoneal signs.  Musculoskeletal:        General: Normal range of motion.     Cervical back: Normal range of motion.     Right lower leg: No edema.     Left lower leg: No edema.  Skin:    General:  Skin is warm and dry.     Capillary Refill: Capillary refill takes less than 2 seconds.  Neurological:     Mental Status: He is oriented to person, place, and time. Mental status is at baseline.     GCS: GCS eye subscore is 4. GCS verbal subscore is 5. GCS motor subscore is 6.     Comments: Fluent speech, no facial droop.  Psychiatric:        Behavior: Behavior normal.     ED Results / Procedures / Treatments   Labs (all labs ordered are listed, but only abnormal results are displayed) Labs Reviewed  CBC WITH DIFFERENTIAL/PLATELET - Abnormal; Notable for the following components:      Result Value   RBC 3.95 (*)    Hemoglobin 11.9 (*)    HCT 38.0 (*)    All other components within normal limits  BASIC METABOLIC PANEL - Abnormal; Notable for the following components:   Glucose, Bld 115 (*)    BUN 29 (*)    All other components within normal limits  BRAIN NATRIURETIC PEPTIDE    EKG EKG Interpretation  Date/Time:  Sunday March 07 2019 07:34:32 EST Ventricular Rate:  72 PR Interval:    QRS Duration: 89 QT Interval:  422 QTC Calculation: 462 R Axis:   16 Text Interpretation: Sinus rhythm Confirmed by Raeford Razor [phone]) on 03/07/2019 9:45:36 AM   Radiology DG Chest Portable 1 View  Result Date: 03/07/2019 CLINICAL DATA:  Short of breath. EXAM: PORTABLE CHEST 1 VIEW COMPARISON:  02/13/2019 FINDINGS: Heart size and mediastinal contours are unremarkable. No pleural effusion or edema. Diffuse decreased lung volumes. No airspace consolidation. IMPRESSION: Low lung volumes.  No acute findings. Electronically Signed   By: Signa Kell M.D.   On: 03/07/2019 08:02  Procedures Procedures (including critical care time)  Medications Ordered in ED Medications - No data to display  ED Course  I have reviewed the triage vital signs and the nursing notes.  Pertinent labs & imaging results that were available during my care of the patient were reviewed by me and considered  in my medical decision making (see chart for details).    MDM Rules/Calculators/A&P                     7:02 AM Patient seen and examined. Patient presents awake, alert, hemodynamically stable, afebrile, non toxic.  Patient presents with shortness of breath.  He has not had power in his house for the last 12 hours.  He was unable to charge home O2.   On his home dose of 1L [place] he is resting comfortably. Normal work of breathing symmetric chest rise. Lungs are clear to ausculation in all fields. No lower extremity edema, no signs of volume overload on exam. He brought his home O2, charging now.  EKG sinus rhythm.  Basic labs obtained and are overall normal. BNP within normal range. BMP without severe electrolyte derangement or renal insufficiency.  CBC shows no leukocytosis, hemoglobin of 11.9.  It was 13.43 weeks ago, his baseline appears to be around 12 looking through his chart. I viewed pt's chest xray and it does not suggest acute infectious processes   10:52 AM Patient lives at home with his brother.  We have been in touch with his brother who states there is still no power at the house.  He is reporting [employer] update states power should be returned sometime this evening.  We will hold patient here in the emergency department until we confirm his power has been turned back on so he can safely go home with O2.  Patient does not have anyone in the area to stay that has power. Findings and plan of care discussed with supervising physician Dr. Wilson Singer who agrees with plan of care.   12:30 PM Carb modified diet ordered incase patient is here for future meals. Patient is resting comfortably at this time.   Portions of this note were generated with [employer]. Dictation errors may occur despite best attempts at proofreading.   Final Clinical Impression(s) / ED Diagnoses Final diagnoses:  Shortness of breath    Rx / DC Orders ED Discharge Orders    None       Flint Melter 03/07/19 1235    Virgel Manifold, MD 03/09/19 1247

## 2019-03-07 NOTE — Discharge Instructions (Addendum)
You have been seen today for shortness of breath, power outage. Please read and follow all provided instructions. Return to the emergency room for worsening condition or new concerning symptoms.    Work up here is unremarkable. No signs of infection or CHF/COPD exacerbation.  1. Medications:  Continue usual home medications Take medications as prescribed. Please review all of the medicines and only take them if you do not have an allergy to them.   2. Treatment: rest  3. Follow Up:  Please follow up with primary care provider by scheduling an appointment as soon as possible for a visit for symptom recheck   ?

## 2019-03-07 NOTE — ED Notes (Signed)
Spoke with patient's brother, power will not be back on until 7 PM this evening. Patient's brother states he does not have immediate friends/famiy in the area that the patient could stay with.

## 2019-03-15 ENCOUNTER — Encounter: Payer: Self-pay | Admitting: Internal Medicine

## 2019-03-23 ENCOUNTER — Encounter: Payer: Self-pay | Admitting: Internal Medicine

## 2019-03-29 ENCOUNTER — Encounter: Payer: Self-pay | Admitting: Internal Medicine

## 2019-03-29 ENCOUNTER — Ambulatory Visit (INDEPENDENT_AMBULATORY_CARE_PROVIDER_SITE_OTHER): Payer: Medicare Other | Admitting: Internal Medicine

## 2019-03-29 ENCOUNTER — Other Ambulatory Visit: Payer: Self-pay

## 2019-03-29 VITALS — BP 134/82 | HR 74 | Temp 98.1°F | Ht 70.0 in | Wt 316.0 lb

## 2019-03-29 DIAGNOSIS — J309 Allergic rhinitis, unspecified: Secondary | ICD-10-CM | POA: Diagnosis not present

## 2019-03-29 DIAGNOSIS — L84 Corns and callosities: Secondary | ICD-10-CM

## 2019-03-29 DIAGNOSIS — F418 Other specified anxiety disorders: Secondary | ICD-10-CM | POA: Diagnosis not present

## 2019-03-29 DIAGNOSIS — I10 Essential (primary) hypertension: Secondary | ICD-10-CM | POA: Diagnosis not present

## 2019-03-29 MED ORDER — TAMSULOSIN HCL 0.4 MG PO CAPS: 0.4000 mg | ORAL_CAPSULE | Freq: Two times a day (BID) | ORAL | 3 refills | Status: AC

## 2019-03-29 MED ORDER — OMEPRAZOLE 20 MG PO CPDR: 20.0000 mg | DELAYED_RELEASE_CAPSULE | Freq: Every day | ORAL | 3 refills | Status: DC

## 2019-03-29 MED ORDER — ATORVASTATIN CALCIUM 40 MG PO TABS: 40.0000 mg | ORAL_TABLET | Freq: Every day | ORAL | 3 refills | Status: DC

## 2019-03-29 MED ORDER — SERTRALINE HCL 100 MG PO TABS: 200.0000 mg | ORAL_TABLET | Freq: Every day | ORAL | 3 refills | Status: DC

## 2019-03-29 MED ORDER — GABAPENTIN 300 MG PO CAPS: 600.0000 mg | ORAL_CAPSULE | Freq: Every day | ORAL | 1 refills | Status: DC

## 2019-03-29 MED ORDER — TRIAMCINOLONE ACETONIDE 55 MCG/ACT NA AERO: 2.0000 | INHALATION_SPRAY | Freq: Every day | NASAL | 12 refills | Status: AC

## 2019-03-29 MED ORDER — LISINOPRIL 20 MG PO TABS: 20.0000 mg | ORAL_TABLET | Freq: Every day | ORAL | 3 refills | Status: DC

## 2019-03-29 MED ORDER — APIXABAN 5 MG PO TABS: 5.0000 mg | ORAL_TABLET | Freq: Two times a day (BID) | ORAL | 3 refills | Status: DC

## 2019-03-29 MED ORDER — FUROSEMIDE 40 MG PO TABS: 40.0000 mg | ORAL_TABLET | Freq: Every day | ORAL | 3 refills | Status: AC

## 2019-03-29 MED ORDER — FEXOFENADINE HCL 180 MG PO TABS: 180.0000 mg | ORAL_TABLET | Freq: Every day | ORAL | 2 refills | Status: DC

## 2019-03-29 MED ORDER — METOPROLOL TARTRATE 25 MG PO TABS: 12.5000 mg | ORAL_TABLET | Freq: Two times a day (BID) | ORAL | 3 refills | Status: DC

## 2019-03-29 NOTE — Progress Notes (Signed)
Subjective:    Patient ID: Phillip Carwin., male    DOB: 1960/06/20, 59 y.o.   MRN: [id number]  HPI   Here to f/u, Does have several wks ongoing nasal allergy symptoms with clearish congestion, itch and sneezing, without fever, pain, ST, cough, swelling or wheezing.  C/o mild worsening depressive symptoms, suicidal ideation, or panic.  Also with callouses ongoing to the heels, asks for podiatry.  Pt denies chest pain, increased sob or doe, wheezing, orthopnea, PND, increased LE swelling, palpitations, dizziness or syncope.  Pt denies new neurological symptoms such as new headache, or facial or extremity weakness or numbness   Pt denies polydipsia, polyuria Past Medical History:  Diagnosis Date  . Allergic rhinitis   . Anemia of chronic disease   . Anxiety   . Asthma   . Autism   . 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 Depression   . Gallstone   . GERD (gastroesophageal reflux disease)   . Hyperlipidemia   . Hypertension   . IBS (irritable bowel syndrome)   . Obesity   . OSA (obstructive sleep apnea)   . Pneumonia   . UTI (urinary tract infection)    Past Surgical History:  Procedure Laterality Date  . ORIF ANKLE FRACTURE Left 03/02/2015   Procedure: OPEN REDUCTION INTERNAL FIXATION (ORIF) LEFT ANKLE TRIMALLEOLAR FRACTURE;  Surgeon: Wylene Simmer, MD;  Location: [facility];  Service: Orthopedics;  Laterality: Left;    reports that he has never smoked. He has never used smokeless tobacco. He reports that he does not drink alcohol or use drugs. family history includes Alcohol abuse in an other family member; Arthritis in an other family member; Breast cancer in an other family member; Diabetes in an other family member; Heart disease in his father and another family member; Hyperlipidemia in an other family member; Irritable bowel syndrome in his mother; Kidney disease in an other family member; Pancreatic cancer in an other family member; Prostate cancer in an other family member; Stroke in  an other family member. Allergies  Allergen Reactions  . Fluoxetine Hcl Other (See Comments)    ineffective  . Xanax [Alprazolam] Other (See Comments)    Made patient too sleepy   Current Outpatient Medications on File Prior to Visit  Medication Sig Dispense Refill  . albuterol (VENTOLIN HFA) 108 (90 Base) MCG/ACT inhaler Inhale 2 puffs into the lungs every 6 (six) hours as needed for wheezing or shortness of breath. 3 Inhaler 3  . aspirin EC 81 MG tablet Take 81 mg by mouth daily.    . cholecalciferol (VITAMIN D3) 25 MCG (1000 UNIT) tablet Take 1,000 Units by mouth daily.    . hydroxypropyl methylcellulose / hypromellose (ISOPTO TEARS / GONIOVISC) 2.5 % ophthalmic solution Place 1 drop into both eyes 3 (three) times daily as needed for dry eyes.    . vitamin B-12 (CYANOCOBALAMIN) 1000 MCG tablet Take 1,000 mcg by mouth daily.     No current facility-administered medications on file prior to visit.   Review of Systems All otherwise neg per pt     Objective:   Physical Exam BP 134/82   Pulse 74   Temp 98.1 F (36.7 C)   Ht 5\' 10"  (1.778 m)   Wt (!) 316 lb (143.3 kg)   SpO2 99%   BMI 45.34 kg/m  VS noted, morbid obese Constitutional: Pt appears in NAD HENT: Head: NCAT.  Right Ear: External ear normal.  Left Ear: External ear normal.  Eyes: .  Pupils are equal, round, and reactive to light. Conjunctivae and EOM are normal Nose: without d/c or deformity Neck: Neck supple. Gross normal ROM Cardiovascular: Normal rate and regular rhythm.   Pulmonary/Chest: Effort normal and breath sounds without rales or wheezing.  Abd:  Soft, NT, ND, + BS, no organomegaly Neurological: Pt is alert. At baseline orientation, motor grossly intact Skin: Skin is warm. No rashes, no LE edema but + severe bilateral plantar callouses Psychiatric: Pt behavior is normal without agitation  All otherwise neg per pt Lab Results  Component Value Date   WBC 6.7 03/07/2019   HGB 11.9 (L) 03/07/2019    HCT 38.0 (L) 03/07/2019   PLT 236 03/07/2019   GLUCOSE 115 (H) 03/07/2019   CHOL 161 06/25/2018   TRIG 105 02/12/2019   HDL 43.10 06/25/2018   LDLDIRECT 121.0 02/26/2017   LDLCALC 91 06/25/2018   ALT 27 02/12/2019   AST 31 02/12/2019   NA 141 03/07/2019   K 4.1 03/07/2019   CL 104 03/07/2019   CREATININE 1.13 03/07/2019   BUN 29 (H) 03/07/2019   CO2 29 03/07/2019   TSH 1.32 06/25/2018   PSA 0.32 06/25/2018   INR 1.05 03/02/2015   HGBA1C 6.6 (H) 06/25/2018         Assessment & Plan:

## 2019-03-29 NOTE — Assessment & Plan Note (Signed)
Ok for inreased zoloft 200 qd

## 2019-03-29 NOTE — Patient Instructions (Signed)
Ok to incresae the zoloft to 200 mg per day  You will be contacted regarding the referral for: foot doctor (podiatry) and psychiatry  Please take all new medication as prescribed - the allegra and nasacort for the allergies  Please continue all other medications as before, and refills have been done if requested.  Please have the pharmacy call with any other refills you may need.  Please continue your efforts at being more active, low cholesterol diet, and weight control.  Please keep your appointments with your specialists as you may have planned

## 2019-03-29 NOTE — Assessment & Plan Note (Signed)
stable overall by history and exam, recent data reviewed with pt, and pt to continue medical treatment as before,  to f/u any worsening symptoms or concerns  

## 2019-03-29 NOTE — Assessment & Plan Note (Addendum)
Ok for podiatry referral  I spent 31 minutes in preparing to see the patient by review of recent labs, imaging and procedures, obtaining and reviewing separately obtained history, communicating with the patient and family or caregiver, ordering medications, tests or procedures, and documenting clinical information in the EHR including the differential Dx, treatment, and any further evaluation and other management of callous,, allergies, HTn, depression

## 2019-03-29 NOTE — Assessment & Plan Note (Signed)
Mild to mod, for allegra and nasacort asd,   to f/u any worsening symptoms or concerns 

## 2019-04-06 DIAGNOSIS — R0602 Shortness of breath: Secondary | ICD-10-CM | POA: Diagnosis not present

## 2019-04-06 DIAGNOSIS — R05 Cough: Secondary | ICD-10-CM | POA: Diagnosis not present

## 2019-04-06 DIAGNOSIS — R410 Disorientation, unspecified: Secondary | ICD-10-CM | POA: Diagnosis not present

## 2019-04-06 DIAGNOSIS — I10 Essential (primary) hypertension: Secondary | ICD-10-CM | POA: Diagnosis not present

## 2019-04-16 ENCOUNTER — Ambulatory Visit (INDEPENDENT_AMBULATORY_CARE_PROVIDER_SITE_OTHER): Payer: Medicare Other | Admitting: Internal Medicine

## 2019-04-16 ENCOUNTER — Other Ambulatory Visit: Payer: Self-pay

## 2019-04-16 ENCOUNTER — Encounter: Payer: Self-pay | Admitting: Internal Medicine

## 2019-04-16 VITALS — BP 152/84 | HR 68 | Temp 98.1°F | Ht 70.0 in | Wt 319.0 lb

## 2019-04-16 DIAGNOSIS — F418 Other specified anxiety disorders: Secondary | ICD-10-CM

## 2019-04-16 DIAGNOSIS — L84 Corns and callosities: Secondary | ICD-10-CM | POA: Diagnosis not present

## 2019-04-16 DIAGNOSIS — R739 Hyperglycemia, unspecified: Secondary | ICD-10-CM

## 2019-04-16 DIAGNOSIS — R21 Rash and other nonspecific skin eruption: Secondary | ICD-10-CM | POA: Diagnosis not present

## 2019-04-16 DIAGNOSIS — H6691 Otitis media, unspecified, right ear: Secondary | ICD-10-CM

## 2019-04-16 DIAGNOSIS — I10 Essential (primary) hypertension: Secondary | ICD-10-CM | POA: Diagnosis not present

## 2019-04-16 MED ORDER — NYSTATIN 100000 UNIT/GM EX POWD: CUTANEOUS | 1 refills | Status: DC

## 2019-04-16 MED ORDER — BUPROPION HCL ER (XL) 300 MG PO TB24: 300.0000 mg | ORAL_TABLET | Freq: Every day | ORAL | 3 refills | Status: AC

## 2019-04-16 MED ORDER — AMOXICILLIN 500 MG PO CAPS: 1000.0000 mg | ORAL_CAPSULE | Freq: Two times a day (BID) | ORAL | 0 refills | Status: DC

## 2019-04-16 NOTE — Progress Notes (Signed)
Subjective:    Patient ID: Phillip Carwin., male    DOB: 1960-07-06, 59 y.o.   MRN: [id number]  HPI  Here to f/u, very difficult historian due to autism, wearing mask and slurred speech underlying, and increasing weight.  C/o left foot callous and corn not remembering this was discussed at last visit, and he is reminded of his pending podiatry referral.  Also with right ear pain, feeling bad, ? Feverish with hA, mild ST but no cough, wheezing and Pt denies chest pain, increased sob or doe, wheezing, orthopnea, PND, increased LE swelling, palpitations, dizziness or syncope.  Also c/o worsening depressive symptoms, but no suicidal ideation, or panic; has ongoing anxiety.  Also c/o left scrotal and groin pain with rash for several wks.  Past Medical History:  Diagnosis Date   Allergic rhinitis    Anemia of chronic disease    Anxiety    Asthma    Autism    Bladder 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/29/2011   Depression    Gallstone    GERD (gastroesophageal reflux disease)    Hyperlipidemia    Hypertension    IBS (irritable bowel syndrome)    Obesity    OSA (obstructive sleep apnea)    Pneumonia    UTI (urinary tract infection)    Past Surgical History:  Procedure Laterality Date   ORIF ANKLE FRACTURE Left 03/02/2015   Procedure: OPEN REDUCTION INTERNAL FIXATION (ORIF) LEFT ANKLE TRIMALLEOLAR FRACTURE;  Surgeon: Wylene Simmer, MD;  Location: [place];  Service: Orthopedics;  Laterality: Left;    reports that he has never smoked. He has never used smokeless tobacco. He reports that he does not drink alcohol or use drugs. family history includes Alcohol abuse in an other family member; Arthritis in an other family member; Breast cancer in an other family member; Diabetes in an other family member; Heart disease in his father and another family member; Hyperlipidemia in an other family member; Irritable bowel syndrome in his mother; Kidney disease in an other family member; Pancreatic cancer  in an other family member; Prostate cancer in an other family member; Stroke in an other family member. Allergies  Allergen Reactions   Fluoxetine Hcl Other (See Comments)    ineffective   Xanax [Alprazolam] Other (See Comments)    Made patient too sleepy   Current Outpatient Medications on File Prior to Visit  Medication Sig Dispense Refill   albuterol (VENTOLIN HFA) 108 (90 Base) MCG/ACT inhaler Inhale 2 puffs into the lungs every 6 (six) hours as needed for wheezing or shortness of breath. 3 Inhaler 3   apixaban (ELIQUIS) 5 MG TABS tablet Take 1 tablet (5 mg total) by mouth 2 (two) times daily. 180 tablet 3   aspirin EC 81 MG tablet Take 81 mg by mouth daily.     atorvastatin (LIPITOR) 40 MG tablet Take 1 tablet (40 mg total) by mouth daily. 90 tablet 3   cholecalciferol (VITAMIN D3) 25 MCG (1000 UNIT) tablet Take 1,000 Units by mouth daily.     fexofenadine (ALLEGRA) 180 MG tablet Take 1 tablet (180 mg total) by mouth daily. 30 tablet 2   furosemide (LASIX) 40 MG tablet Take 1 tablet (40 mg total) by mouth daily. 90 tablet 3   gabapentin (NEURONTIN) 300 MG capsule Take 2 capsules (600 mg total) by mouth at bedtime. 180 capsule 1   hydroxypropyl methylcellulose / hypromellose (ISOPTO TEARS / GONIOVISC) 2.5 % ophthalmic solution Place 1 drop into both eyes 3 (three) times  daily as needed for dry eyes.     lisinopril (ZESTRIL) 20 MG tablet Take 1 tablet (20 mg total) by mouth daily. 90 tablet 3   metoprolol tartrate (LOPRESSOR) 25 MG tablet Take 0.5 tablets (12.5 mg total) by mouth 2 (two) times daily. 90 tablet 3   omeprazole (PRILOSEC) 20 MG capsule Take 1 capsule (20 mg total) by mouth daily. 90 capsule 3   sertraline (ZOLOFT) 100 MG tablet Take 2 tablets (200 mg total) by mouth daily. 180 tablet 3   tamsulosin (FLOMAX) 0.4 MG CAPS capsule Take 1 capsule (0.4 mg total) by mouth 2 (two) times daily. 180 capsule 3   triamcinolone (NASACORT) 55 MCG/ACT AERO nasal inhaler  Place 2 sprays into the nose daily. 1 Inhaler 12   vitamin B-12 (CYANOCOBALAMIN) 1000 MCG tablet Take 1,000 mcg by mouth daily.     No current facility-administered medications on file prior to visit.   Review of Systems All otherwise neg per pt    Objective:   Physical Exam BP (!) 152/84    Pulse 68    Temp 98.1 F (36.7 C)    Ht 5\' 10"  (1.778 m)    Wt (!) 319 lb (144.7 kg)    SpO2 98%    BMI 45.77 kg/m  on 3L [place] at rest VS noted, mild ill, morbid obese Constitutional: Pt appears in NAD HENT: Head: NCAT.  Right Ear: External ear normal. Right tM with severe red, effusin Left Ear: External ear normal.  Eyes: . Pupils are equal, round, and reactive to light. Conjunctivae and EOM are normal Nose: without d/c or deformity Neck: Neck supple. Gross normal ROM Cardiovascular: Normal rate and regular rhythm.   Pulmonary/Chest: Effort normal and breath sounds without rales or wheezing.  Abd:  Soft, NT, ND, + BS, no organomegaly Left groin and scrotum with candidal tp rash Neurological: Pt is alert. At baseline orientation, motor grossly intact Skin: Skin is warm. No rashes, other new lesions, no LE edema but left foot again with callous and mid foot plantar tender corn Psychiatric: Pt behavior is normal without agitation  All otherwise neg per pt Lab Results  Component Value Date   WBC 6.7 03/07/2019   HGB 11.9 (L) 03/07/2019   HCT 38.0 (L) 03/07/2019   PLT 236 03/07/2019   GLUCOSE 115 (H) 03/07/2019   CHOL 161 06/25/2018   TRIG 105 02/12/2019   HDL 43.10 06/25/2018   LDLDIRECT 121.0 02/26/2017   LDLCALC 91 06/25/2018   ALT 27 02/12/2019   AST 31 02/12/2019   NA 141 03/07/2019   K 4.1 03/07/2019   CL 104 03/07/2019   CREATININE 1.13 03/07/2019   BUN 29 (H) 03/07/2019   CO2 29 03/07/2019   TSH 1.32 06/25/2018   PSA 0.32 06/25/2018   INR 1.05 03/02/2015   HGBA1C 6.6 (H) 06/25/2018       Assessment & Plan:

## 2019-04-16 NOTE — Patient Instructions (Signed)
Please take all new medication as prescribed - the generic wellbutrin for depression, the nystatin powder for the rash in the groin and scrotum area, and amoxicillin for right ear infection  You have been referred as of Mar 8 to podiatry (foot doctor) and psychiatry  Please continue all other medications as before, and refills have been done if requested.  Please have the pharmacy call with any other refills you may need.  Please continue your efforts at being more active, low cholesterol diet, and weight control.  Please keep your appointments with your specialists as you may have planned

## 2019-04-17 ENCOUNTER — Encounter: Payer: Self-pay | Admitting: Internal Medicine

## 2019-04-17 DIAGNOSIS — H6691 Otitis media, unspecified, right ear: Secondary | ICD-10-CM | POA: Insufficient documentation

## 2019-04-17 NOTE — Assessment & Plan Note (Signed)
stable overall by history and exam, recent data reviewed with pt, and pt to continue medical treatment as before,  to f/u any worsening symptoms or concerns  

## 2019-04-17 NOTE — Assessment & Plan Note (Signed)
For podiatry f/u

## 2019-04-17 NOTE — Assessment & Plan Note (Signed)
For nystatin powder asd,  to f/u any worsening symptoms or concerns

## 2019-04-17 NOTE — Assessment & Plan Note (Signed)
To add wellbutrin xl 300 qd

## 2019-04-17 NOTE — Assessment & Plan Note (Signed)
Mild to mod, for antibx course,  to f/u any worsening symptoms or concerns  I spent 42 minutes in preparing to see the patient by review of recent labs, imaging and procedures, obtaining and reviewing separately obtained history, communicating with the patient and family or caregiver, ordering medications, tests or procedures, and documenting clinical information in the EHR including the differential Dx, treatment, and any further evaluation and other management of right otitis media, rash, depression, HTN, hyperglycemia, left foot callous/corn

## 2019-05-10 ENCOUNTER — Encounter: Payer: Self-pay | Admitting: Internal Medicine

## 2019-05-10 ENCOUNTER — Ambulatory Visit (INDEPENDENT_AMBULATORY_CARE_PROVIDER_SITE_OTHER): Payer: Medicare Other | Admitting: Internal Medicine

## 2019-05-10 ENCOUNTER — Other Ambulatory Visit: Payer: Self-pay

## 2019-05-10 VITALS — BP 158/70 | HR 78 | Temp 98.7°F | Resp 26 | Ht 70.0 in | Wt 327.0 lb

## 2019-05-10 DIAGNOSIS — G4733 Obstructive sleep apnea (adult) (pediatric): Secondary | ICD-10-CM

## 2019-05-10 DIAGNOSIS — L84 Corns and callosities: Secondary | ICD-10-CM | POA: Diagnosis not present

## 2019-05-10 DIAGNOSIS — E785 Hyperlipidemia, unspecified: Secondary | ICD-10-CM

## 2019-05-10 DIAGNOSIS — M791 Myalgia, unspecified site: Secondary | ICD-10-CM | POA: Diagnosis not present

## 2019-05-10 DIAGNOSIS — I10 Essential (primary) hypertension: Secondary | ICD-10-CM | POA: Diagnosis not present

## 2019-05-10 DIAGNOSIS — R739 Hyperglycemia, unspecified: Secondary | ICD-10-CM

## 2019-05-10 DIAGNOSIS — N320 Bladder-neck obstruction: Secondary | ICD-10-CM

## 2019-05-10 LAB — CBC WITH DIFFERENTIAL/PLATELET
Basophils Absolute: 0 10*3/uL (ref 0.0–0.1)
Basophils Relative: 0.8 % (ref 0.0–3.0)
Eosinophils Absolute: 0.3 10*3/uL (ref 0.0–0.7)
Eosinophils Relative: 5.1 % — ABNORMAL HIGH (ref 0.0–5.0)
HCT: 35.7 % — ABNORMAL LOW (ref 39.0–52.0)
Hemoglobin: 11.7 g/dL — ABNORMAL LOW (ref 13.0–17.0)
Lymphocytes Relative: 23.3 % (ref 12.0–46.0)
Lymphs Abs: 1.4 10*3/uL (ref 0.7–4.0)
MCHC: 32.8 g/dL (ref 30.0–36.0)
MCV: 91.4 fl (ref 78.0–100.0)
Monocytes Absolute: 0.7 10*3/uL (ref 0.1–1.0)
Monocytes Relative: 11.3 % (ref 3.0–12.0)
Neutro Abs: 3.7 10*3/uL (ref 1.4–7.7)
Neutrophils Relative %: 59.5 % (ref 43.0–77.0)
Platelets: 251 10*3/uL (ref 150.0–400.0)
RBC: 3.91 Mil/uL — ABNORMAL LOW (ref 4.22–5.81)
RDW: 14.5 % (ref 11.5–15.5)
WBC: 6.2 10*3/uL (ref 4.0–10.5)

## 2019-05-10 LAB — URINALYSIS, ROUTINE W REFLEX MICROSCOPIC
Bilirubin Urine: NEGATIVE
Hgb urine dipstick: NEGATIVE
Ketones, ur: NEGATIVE
Leukocytes,Ua: NEGATIVE
Nitrite: NEGATIVE
RBC / HPF: NONE SEEN (ref 0–?)
Specific Gravity, Urine: 1.02 (ref 1.000–1.030)
Total Protein, Urine: NEGATIVE
Urine Glucose: NEGATIVE
Urobilinogen, UA: 0.2 (ref 0.0–1.0)
WBC, UA: NONE SEEN (ref 0–?)
pH: 5 (ref 5.0–8.0)

## 2019-05-10 LAB — LIPID PANEL
Cholesterol: 164 mg/dL (ref 0–200)
HDL: 50.2 mg/dL (ref >39.00)
NonHDL: 114.16
Total CHOL/HDL Ratio: 3
Triglycerides: 204 mg/dL — ABNORMAL HIGH (ref 0.0–149.0)
VLDL: 40.8 mg/dL — ABNORMAL HIGH (ref 0.0–40.0)

## 2019-05-10 LAB — BASIC METABOLIC PANEL
BUN: 25 mg/dL — ABNORMAL HIGH (ref 6–23)
CO2: 34 mEq/L — ABNORMAL HIGH (ref 19–32)
Calcium: 9.2 mg/dL (ref 8.4–10.5)
Chloride: 100 mEq/L (ref 96–112)
Creatinine, Ser: 1.14 mg/dL (ref 0.40–1.50)
GFR: 65.68 mL/min (ref >60.00)
Glucose, Bld: 76 mg/dL (ref 70–99)
Potassium: 4.6 mEq/L (ref 3.5–5.1)
Sodium: 140 mEq/L (ref 135–145)

## 2019-05-10 LAB — HEPATIC FUNCTION PANEL
ALT: 28 U/L (ref 0–53)
AST: 23 U/L (ref 0–37)
Albumin: 4.1 g/dL (ref 3.5–5.2)
Alkaline Phosphatase: 120 U/L — ABNORMAL HIGH (ref 39–117)
Bilirubin, Direct: 0.1 mg/dL (ref 0.0–0.3)
Total Bilirubin: 0.4 mg/dL (ref 0.2–1.2)
Total Protein: 7.4 g/dL (ref 6.0–8.3)

## 2019-05-10 LAB — PSA: PSA: 0.33 ng/mL (ref 0.10–4.00)

## 2019-05-10 LAB — TSH: TSH: 1.35 u[IU]/mL (ref 0.35–4.50)

## 2019-05-10 LAB — CK: Total CK: 138 U/L (ref 7–232)

## 2019-05-10 LAB — LDL CHOLESTEROL, DIRECT: Direct LDL: 77 mg/dL

## 2019-05-10 LAB — HEMOGLOBIN A1C: Hgb A1c MFr Bld: 6.7 % — ABNORMAL HIGH (ref 4.6–6.5)

## 2019-05-10 NOTE — Assessment & Plan Note (Signed)
Encouraged to start using cpap

## 2019-05-10 NOTE — Assessment & Plan Note (Addendum)
stable overall by history and exam, recent data reviewed with pt, and pt to continue medical treatment as before,  to f/u any worsening symptoms or concerns  I spent 31 minutes in preparing to see the patient by review of recent labs, imaging and procedures, obtaining and reviewing separately obtained history, communicating with the patient and family or caregiver, ordering medications, tests or procedures, and documenting clinical information in the EHR including the differential Dx, treatment, and any further evaluation and other management of myalgia, hyperglycemia, HLD, OSA, foot corn, htN

## 2019-05-10 NOTE — Assessment & Plan Note (Signed)
Mild uncontrolled, declines med change, for contd wt control and cPAP compliance

## 2019-05-10 NOTE — Patient Instructions (Signed)
Make sure to use your Home CPAP at night every night,as this will help the daytime fatigue, and probably the blood pressure as well  Please continue all other medications as before, and refills have been done if requested.  Please have the pharmacy call with any other refills you may need.  Please continue your efforts at being more active, low cholesterol diet, and weight control.  You are otherwise up to date with prevention measures today.  Please keep your appointments with your specialists as you may have planned  Please go to the LAB at the blood drawing area for the tests to be done  You will be contacted by phone if any changes need to be made immediately.  Otherwise, you will receive a letter about your results with an explanation, but please check with MyChart first.  Please remember to sign up for MyChart if you have not done so, as this will be important to you in the future with finding out test results, communicating by private email, and scheduling acute appointments online when needed.  Please make an Appointment to return in 6 months, or sooner if needed,

## 2019-05-10 NOTE — Progress Notes (Signed)
Subjective:    Patient ID: Phillip Carwin., male    DOB: 1960/08/15, 59 y.o.   MRN: [id number]  HPI  Here to f/u; overall doing ok,  Pt denies chest pain, increasing sob or doe, wheezing, orthopnea, PND, increased LE swelling, palpitations, dizziness or syncope.  Pt denies new neurological symptoms such as new headache, or facial or extremity weakness or numbness.  Pt denies polydipsia, polyuria, or low sugar episode.  Pt states overall good compliance with meds, mostly trying to follow appropriate diet, with wt overall stable,  but little exercise however.  Not using the cpap nightly but states willing to start.  Does c/o ongoing fatigue, and has signficant daytime hypersomnolence.  Wt recently stable but too tired for exercise and wt loss  Also co muscle pain 'all over' Past Medical History:  Diagnosis Date  . Allergic rhinitis   . Anemia of chronic disease   . Anxiety   . Asthma   . Autism   . 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 Depression   . Gallstone   . GERD (gastroesophageal reflux disease)   . Hyperlipidemia   . Hypertension   . IBS (irritable bowel syndrome)   . Obesity   . OSA (obstructive sleep apnea)   . Pneumonia   . UTI (urinary tract infection)    Past Surgical History:  Procedure Laterality Date  . ORIF ANKLE FRACTURE Left 03/02/2015   Procedure: OPEN REDUCTION INTERNAL FIXATION (ORIF) LEFT ANKLE TRIMALLEOLAR FRACTURE;  Surgeon: Wylene Simmer, MD;  Location: [place];  Service: Orthopedics;  Laterality: Left;    reports that he has never smoked. He has never used smokeless tobacco. He reports that he does not drink alcohol or use drugs. family history includes Alcohol abuse in an other family member; Arthritis in an other family member; Breast cancer in an other family member; Diabetes in an other family member; Heart disease in his father and another family member; Hyperlipidemia in an other family member; Irritable bowel syndrome in his mother; Kidney disease in an  other family member; Pancreatic cancer in an other family member; Prostate cancer in an other family member; Stroke in an other family member. Allergies  Allergen Reactions  . Fluoxetine Hcl Other (See Comments)    ineffective  . Xanax [Alprazolam] Other (See Comments)    Made patient too sleepy   Current Outpatient Medications on File Prior to Visit  Medication Sig Dispense Refill  . albuterol (VENTOLIN HFA) 108 (90 Base) MCG/ACT inhaler Inhale 2 puffs into the lungs every 6 (six) hours as needed for wheezing or shortness of breath. 3 Inhaler 3  . amoxicillin (AMOXIL) 500 MG capsule Take 2 capsules (1,000 mg total) by mouth 2 (two) times daily. 40 capsule 0  . apixaban (ELIQUIS) 5 MG TABS tablet Take 1 tablet (5 mg total) by mouth 2 (two) times daily. 180 tablet 3  . aspirin EC 81 MG tablet Take 81 mg by mouth daily.    Marland Kitchen atorvastatin (LIPITOR) 40 MG tablet Take 1 tablet (40 mg total) by mouth daily. 90 tablet 3  . buPROPion (WELLBUTRIN XL) 300 MG 24 hr tablet Take 1 tablet (300 mg total) by mouth daily. 90 tablet 3  . cholecalciferol (VITAMIN D3) 25 MCG (1000 UNIT) tablet Take 1,000 Units by mouth daily.    . fexofenadine (ALLEGRA) 180 MG tablet Take 1 tablet (180 mg total) by mouth daily. 30 tablet 2  . furosemide (LASIX) 40 MG tablet Take 1 tablet (40 mg  total) by mouth daily. 90 tablet 3  . gabapentin (NEURONTIN) 300 MG capsule Take 2 capsules (600 mg total) by mouth at bedtime. 180 capsule 1  . hydroxypropyl methylcellulose / hypromellose (ISOPTO TEARS / GONIOVISC) 2.5 % ophthalmic solution Place 1 drop into both eyes 3 (three) times daily as needed for dry eyes.    Marland Kitchen lisinopril (ZESTRIL) 20 MG tablet Take 1 tablet (20 mg total) by mouth daily. 90 tablet 3  . metoprolol tartrate (LOPRESSOR) 25 MG tablet Take 0.5 tablets (12.5 mg total) by mouth 2 (two) times daily. 90 tablet 3  . nystatin (MYCOSTATIN/NYSTOP) powder Use as directed twice daily as needed for groin and scrotal rash 45 g 1    . omeprazole (PRILOSEC) 20 MG capsule Take 1 capsule (20 mg total) by mouth daily. 90 capsule 3  . sertraline (ZOLOFT) 100 MG tablet Take 2 tablets (200 mg total) by mouth daily. 180 tablet 3  . tamsulosin (FLOMAX) 0.4 MG CAPS capsule Take 1 capsule (0.4 mg total) by mouth 2 (two) times daily. 180 capsule 3  . triamcinolone (NASACORT) 55 MCG/ACT AERO nasal inhaler Place 2 sprays into the nose daily. 1 Inhaler 12  . vitamin B-12 (CYANOCOBALAMIN) 1000 MCG tablet Take 1,000 mcg by mouth daily.     No current facility-administered medications on file prior to visit.   Review of Systems All otherwise neg per pt    Objective:   Physical Exam BP (!) 158/70 (BP Location: Left Arm, Patient Position: Sitting, Cuff Size: Large)   Pulse 78   Temp 98.7 F (37.1 C) (Oral)   Resp (!) 26   Ht 5\' 10"  (1.778 m)   Wt (!) 327 lb (148.3 kg)   SpO2 92%   BMI 46.92 kg/m  VS noted, morbid obese Constitutional: Pt appears in NAD HENT: Head: NCAT.  Right Ear: External ear normal.  Left Ear: External ear normal.  Eyes: . Pupils are equal, round, and reactive to light. Conjunctivae and EOM are normal Nose: without d/c or deformity Neck: Neck supple. Gross normal ROM Cardiovascular: Normal rate and regular rhythm.   Pulmonary/Chest: Effort normal and breath sounds without rales or wheezing.  Abd:  Soft, NT, ND, + BS, no organomegaly Neurological: Pt is alert. At baseline orientation, motor grossly intact Skin: Skin is warm. No rashes, other new lesions, no LE edema Psychiatric: Pt behavior is normal without agitation  All otherwise neg per pt   Lab Results  Component Value Date   WBC 6.7 03/07/2019   HGB 11.9 (L) 03/07/2019   HCT 38.0 (L) 03/07/2019   PLT 236 03/07/2019   GLUCOSE 115 (H) 03/07/2019   CHOL 161 06/25/2018   TRIG 105 02/12/2019   HDL 43.10 06/25/2018   LDLDIRECT 121.0 02/26/2017   LDLCALC 91 06/25/2018   ALT 27 02/12/2019   AST 31 02/12/2019   NA 141 03/07/2019   K 4.1  03/07/2019   CL 104 03/07/2019   CREATININE 1.13 03/07/2019   BUN 29 (H) 03/07/2019   CO2 29 03/07/2019   TSH 1.32 06/25/2018   PSA 0.32 06/25/2018   INR 1.05 03/02/2015   HGBA1C 6.6 (H) 06/25/2018      Assessment & Plan:

## 2019-05-10 NOTE — Assessment & Plan Note (Signed)
stable overall by history and exam, recent data reviewed with pt, and pt to continue medical treatment as before,  to f/u any worsening symptoms or concerns  

## 2019-05-10 NOTE — Assessment & Plan Note (Signed)
For ck with labs 

## 2019-05-10 NOTE — Assessment & Plan Note (Signed)
Has been referred to podiatry

## 2019-05-21 DIAGNOSIS — Z23 Encounter for immunization: Secondary | ICD-10-CM | POA: Diagnosis not present

## 2019-06-02 ENCOUNTER — Other Ambulatory Visit: Payer: Self-pay

## 2019-06-02 ENCOUNTER — Ambulatory Visit (INDEPENDENT_AMBULATORY_CARE_PROVIDER_SITE_OTHER): Payer: Medicare Other | Admitting: Internal Medicine

## 2019-06-02 ENCOUNTER — Ambulatory Visit (HOSPITAL_COMMUNITY)
Admission: RE | Admit: 2019-06-02 | Discharge: 2019-06-02 | Disposition: A | Discharge status: Home / Self Care | Payer: Medicare Other | Source: Ambulatory Visit | Attending: Cardiovascular Disease | Admitting: Cardiovascular Disease

## 2019-06-02 ENCOUNTER — Encounter: Payer: Self-pay | Admitting: Internal Medicine

## 2019-06-02 VITALS — BP 180/90 | HR 76 | Temp 97.6°F | Ht 70.0 in | Wt 332.0 lb

## 2019-06-02 DIAGNOSIS — M12562 Traumatic arthropathy, left knee: Secondary | ICD-10-CM

## 2019-06-02 DIAGNOSIS — I10 Essential (primary) hypertension: Secondary | ICD-10-CM

## 2019-06-02 DIAGNOSIS — R739 Hyperglycemia, unspecified: Secondary | ICD-10-CM

## 2019-06-02 DIAGNOSIS — M7989 Other specified soft tissue disorders: Secondary | ICD-10-CM | POA: Insufficient documentation

## 2019-06-02 DIAGNOSIS — M79662 Pain in left lower leg: Secondary | ICD-10-CM

## 2019-06-02 MED ORDER — DOXYCYCLINE HYCLATE 100 MG PO TABS: 100.0000 mg | ORAL_TABLET | Freq: Two times a day (BID) | ORAL | 0 refills | Status: DC

## 2019-06-02 NOTE — Progress Notes (Signed)
Subjective:    Patient ID: Phillip Ohm., male    DOB: 1960-09-30, 59 y.o.   MRN: [id number]  HPI  Here to f/u with c/o worsening le edema left > right worse since a fall to the left knee with large effusion today and abrasion; also mentions only taking the eliquis once daily for some reason.  Also has worsening redness especially to the post calf area but no fever, chills.  Also has some itching to the feet worse with the swelling. Pt denies chest pain, increased sob or doe, wheezing, orthopnea, PND, increased LE swelling, palpitations, dizziness or syncope.  Pt denies new neurological symptoms such as new headache, or facial or extremity weakness or numbness   Pt denies polydipsia, polyuria Past Medical History:  Diagnosis Date  . Allergic rhinitis   . Anemia of chronic disease   . Anxiety   . Asthma   . Autism   . 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 Depression   . Gallstone   . GERD (gastroesophageal reflux disease)   . Hyperlipidemia   . Hypertension   . IBS (irritable bowel syndrome)   . Obesity   . OSA (obstructive sleep apnea)   . Pneumonia   . UTI (urinary tract infection)    Past Surgical History:  Procedure Laterality Date  . ORIF ANKLE FRACTURE Left 03/02/2015   Procedure: OPEN REDUCTION INTERNAL FIXATION (ORIF) LEFT ANKLE TRIMALLEOLAR FRACTURE;  Surgeon: Toni Arthurs, MD;  Location: [facility];  Service: Orthopedics;  Laterality: Left;    reports that he has never smoked. He has never used smokeless tobacco. He reports that he does not drink alcohol or use drugs. family history includes Alcohol abuse in an other family member; Arthritis in an other family member; Breast cancer in an other family member; Diabetes in an other family member; Heart disease in his father and another family member; Hyperlipidemia in an other family member; Irritable bowel syndrome in his mother; Kidney disease in an other family member; Pancreatic cancer in an other family member; Prostate  cancer in an other family member; Stroke in an other family member. Allergies  Allergen Reactions  . Fluoxetine Hcl Other (See Comments)    ineffective  . Xanax [Alprazolam] Other (See Comments)    Made patient too sleepy   Current Outpatient Medications on File Prior to Visit  Medication Sig Dispense Refill  . albuterol (VENTOLIN HFA) 108 (90 Base) MCG/ACT inhaler Inhale 2 puffs into the lungs every 6 (six) hours as needed for wheezing or shortness of breath. 3 Inhaler 3  . amoxicillin (AMOXIL) 500 MG capsule Take 2 capsules (1,000 mg total) by mouth 2 (two) times daily. 40 capsule 0  . apixaban (ELIQUIS) 5 MG TABS tablet Take 1 tablet (5 mg total) by mouth 2 (two) times daily. 180 tablet 3  . aspirin EC 81 MG tablet Take 81 mg by mouth daily.    Marland Kitchen atorvastatin (LIPITOR) 40 MG tablet Take 1 tablet (40 mg total) by mouth daily. 90 tablet 3  . buPROPion (WELLBUTRIN XL) 300 MG 24 hr tablet Take 1 tablet (300 mg total) by mouth daily. 90 tablet 3  . cholecalciferol (VITAMIN D3) 25 MCG (1000 UNIT) tablet Take 1,000 Units by mouth daily.    . fexofenadine (ALLEGRA) 180 MG tablet Take 1 tablet (180 mg total) by mouth daily. 30 tablet 2  . furosemide (LASIX) 40 MG tablet Take 1 tablet (40 mg total) by mouth daily. 90 tablet 3  . gabapentin (  NEURONTIN) 300 MG capsule Take 2 capsules (600 mg total) by mouth at bedtime. 180 capsule 1  . hydroxypropyl methylcellulose / hypromellose (ISOPTO TEARS / GONIOVISC) 2.5 % ophthalmic solution Place 1 drop into both eyes 3 (three) times daily as needed for dry eyes.    Marland Kitchen lisinopril (ZESTRIL) 20 MG tablet Take 1 tablet (20 mg total) by mouth daily. 90 tablet 3  . metoprolol tartrate (LOPRESSOR) 25 MG tablet Take 0.5 tablets (12.5 mg total) by mouth 2 (two) times daily. 90 tablet 3  . nystatin (MYCOSTATIN/NYSTOP) powder Use as directed twice daily as needed for groin and scrotal rash 45 g 1  . omeprazole (PRILOSEC) 20 MG capsule Take 1 capsule (20 mg total) by  mouth daily. 90 capsule 3  . sertraline (ZOLOFT) 100 MG tablet Take 2 tablets (200 mg total) by mouth daily. 180 tablet 3  . tamsulosin (FLOMAX) 0.4 MG CAPS capsule Take 1 capsule (0.4 mg total) by mouth 2 (two) times daily. 180 capsule 3  . triamcinolone (NASACORT) 55 MCG/ACT AERO nasal inhaler Place 2 sprays into the nose daily. 1 Inhaler 12  . vitamin B-12 (CYANOCOBALAMIN) 1000 MCG tablet Take 1,000 mcg by mouth daily.     No current facility-administered medications on file prior to visit.   Review of Systems All otherwise neg per pt     Objective:   Physical Exam BP (!) 180/90 (BP Location: Left Arm, Patient Position: Sitting, Cuff Size: Large)   Pulse 76   Temp 97.6 F (36.4 C) (Oral)   Ht 5\' 10"  (1.778 m)   Wt (!) 332 lb (150.6 kg)   SpO2 90%   BMI 47.64 kg/m  VS noted,  Constitutional: Pt appears in NAD HENT: Head: NCAT.  Right Ear: External ear normal.  Left Ear: External ear normal.  Eyes: . Pupils are equal, round, and reactive to light. Conjunctivae and EOM are normal Nose: without d/c or deformity Neck: Neck supple. Gross normal ROM Cardiovascular: Normal rate and regular rhythm.   Pulmonary/Chest: Effort normal and breath sounds without rales or wheezing.  Abd:  Soft, NT, ND, + BS, no organomegaly Neurological: Pt is alert. At baseline orientation, motor grossly intact Skin: Skin is warm. No rashes, other new lesions, 3+ LLE edema with large left knee effusion and abrasion, also 1+ RLE Edema Psychiatric: Pt behavior is normal without agitation  All otherwise neg per pt Lab Results  Component Value Date   WBC 6.2 05/10/2019   HGB 11.7 (L) 05/10/2019   HCT 35.7 (L) 05/10/2019   PLT 251.0 05/10/2019   GLUCOSE 76 05/10/2019   CHOL 164 05/10/2019   TRIG 204.0 (H) 05/10/2019   HDL 50.20 05/10/2019   LDLDIRECT 77.0 05/10/2019   LDLCALC 91 06/25/2018   ALT 28 05/10/2019   AST 23 05/10/2019   NA 140 05/10/2019   K 4.6 05/10/2019   CL 100 05/10/2019    CREATININE 1.14 05/10/2019   BUN 25 (H) 05/10/2019   CO2 34 (H) 05/10/2019   TSH 1.35 05/10/2019   PSA 0.33 05/10/2019   INR 1.05 03/02/2015   HGBA1C 6.7 (H) 05/10/2019      Assessment & Plan:

## 2019-06-02 NOTE — Assessment & Plan Note (Addendum)
Cant r/o dvt; for increased eliquis to bid, and r/o dvt with lle venous doppler, also empiric antibx ? Cellulitis  I spent 41 minutes in preparing to see the patient by review of recent labs, imaging and procedures, obtaining and reviewing separately obtained history, communicating with the patient and family or caregiver, ordering medications, tests or procedures, and documenting clinical information in the EHR including the differential Dx, treatment, and any further evaluation and other management of lle pain and swelling, hyperglycemia, htn, left knee arthritis

## 2019-06-02 NOTE — Assessment & Plan Note (Signed)
stable overall by history and exam, recent data reviewed with pt, and pt to continue medical treatment as before,  to f/u any worsening symptoms or concerns  

## 2019-06-02 NOTE — Assessment & Plan Note (Signed)
Also refer sports medicine

## 2019-06-02 NOTE — Patient Instructions (Addendum)
Remember to take your eliquis twice per day as prescribed  You will be contacted regarding the referral for: leg vein test for blood clots - go to [facility] at   [address] [facility] [address];   Phone:  [phone] -  Arrive by 330 PM for the 4PM test  Please take all new medication as prescribed - the antibiotic  You will be contacted regarding the referral for: Sports medicine for the left knee on the first floor of this building  You can also use the OTC Benadryl cream for the itching  Please continue all other medications as before, and refills have been done if requested.  Please have the pharmacy call with any other refills you may need.  Please keep your appointments with your specialists as you may have planned  Please make an Appointment to return in 1 week

## 2019-06-03 ENCOUNTER — Encounter: Payer: Self-pay | Admitting: Internal Medicine

## 2019-06-03 ENCOUNTER — Ambulatory Visit: Payer: Medicare Other | Admitting: Family Medicine

## 2019-06-07 ENCOUNTER — Other Ambulatory Visit: Payer: Self-pay

## 2019-06-07 ENCOUNTER — Ambulatory Visit (INDEPENDENT_AMBULATORY_CARE_PROVIDER_SITE_OTHER): Payer: Medicare Other | Admitting: Family Medicine

## 2019-06-07 ENCOUNTER — Encounter: Payer: Self-pay | Admitting: Family Medicine

## 2019-06-07 ENCOUNTER — Ambulatory Visit (INDEPENDENT_AMBULATORY_CARE_PROVIDER_SITE_OTHER): Payer: Medicare Other

## 2019-06-07 ENCOUNTER — Ambulatory Visit: Payer: Medicare Other | Admitting: Family Medicine

## 2019-06-07 VITALS — BP 130/70 | HR 81 | Ht 70.0 in | Wt 339.0 lb

## 2019-06-07 DIAGNOSIS — M79605 Pain in left leg: Secondary | ICD-10-CM

## 2019-06-07 DIAGNOSIS — M7042 Prepatellar bursitis, left knee: Secondary | ICD-10-CM

## 2019-06-07 DIAGNOSIS — M7989 Other specified soft tissue disorders: Secondary | ICD-10-CM | POA: Diagnosis not present

## 2019-06-07 DIAGNOSIS — R600 Localized edema: Secondary | ICD-10-CM

## 2019-06-07 DIAGNOSIS — I872 Venous insufficiency (chronic) (peripheral): Secondary | ICD-10-CM

## 2019-06-07 DIAGNOSIS — M79662 Pain in left lower leg: Secondary | ICD-10-CM

## 2019-06-07 MED ORDER — TRIAMCINOLONE ACETONIDE 0.1 % EX CREA: 1.0000 "application " | TOPICAL_CREAM | Freq: Two times a day (BID) | CUTANEOUS | 0 refills | Status: DC

## 2019-06-07 NOTE — Patient Instructions (Signed)
Thank you for coming in today. Finish antibiotic.  Recheck in about 1 week.  Use triamcinolone cream on the legs twice daily for itching.   We can remove some of the fluid from the front of the knee in a week it is still there.    Prepatellar Bursitis  Prepatellar bursitis is inflammation of the prepatellar bursa, which is a fluid-filled sac that cushions the kneecap (patella). Prepatellar bursitis happens when fluid builds up in this sac and causes it to swell. The condition causes knee pain. What are the causes? This condition may be caused by:  Constant pressure on the knees from kneeling.  A hit to the knee.  Falling on the knee.  Infection from bacteria.  Moving the knee often in a forceful way. What increases the risk? You are more likely to develop this condition if:  You play sports that have a high risk of falling on the knee or being hit on the knee. These include football, wrestling, basketball, or soccer.  You do work in which you kneel for long periods of time, such as roofing, plumbing, or gardening.  You have another inflammatory condition, such as gout or rheumatoid arthritis. What are the signs or symptoms? The most common symptom of this condition is knee pain that gets better with rest. Other symptoms include:  Swelling on the front of the kneecap.  Warmth in the knee.  Tenderness with activity.  Redness in the knee.  Inability to bend the knee or to kneel. How is this diagnosed? This condition is diagnosed based on:  A physical exam. Your health care provider will compare your knees and check for tenderness and pain while moving your knee.  Your medical history.  Tests to check for infection. These may include blood tests and tests on the fluid in the bursa.  Imaging tests, such as X-ray, MRI, or ultrasound, to check for damage in the patella, or fluid buildup and swelling in the bursa. How is this treated? This condition may be treated  by:  Resting the knee.  Putting ice on the knee.  Taking medicines, such as: ? NSAIDs. These can help to reduce pain and swelling. ? Antibiotics. These may be needed if you have an infection. ? Steroids. These are used to reduce swelling and inflammation, and may be prescribed if other treatments are not helping.  Raising (elevating) the knee while resting.  Doing exercises to help you maintain movement (physical therapy). These may be recommended after pain and swelling improve.  Having a procedure to remove fluid from the bursa. This may be done if other treatments are not helping.  Having surgery to remove the bursa. This may be done if you have a severe infection or if the condition keeps coming back after treatment. Follow these instructions at home: Medicines  Take over-the-counter and prescription medicines only as told by your health care provider.  If you were prescribed an antibiotic medicine, take it as told by your health care provider. Do not stop taking the antibiotic even if you start to feel better. Managing pain, stiffness, and swelling   If directed, put ice on the injured area. ? Put ice in a plastic bag. ? Place a towel between your skin and the bag. ? Leave the ice on for 20 minutes, 2-3 times a day.  Elevate the injured area above the level of your heart while you are sitting or lying down. Activity  Do not use the injured limb to support your  body weight until your health care provider says that you can.  Rest your knee.  Avoid activities that cause knee pain.  Return to your normal activities as told by your health care provider. Ask your health care provider what activities are safe for you.  Do exercises as told by your health care provider. General instructions  Ask your health care provider when it is safe for you to drive.  Do not use any products that contain nicotine or tobacco, such as cigarettes, e-cigarettes, and chewing tobacco. These  can delay healing. If you need help quitting, ask your health care provider.  Keep all follow-up visits as told by your health care provider. This is important. How is this prevented?  Warm up and stretch before being active.  Cool down and stretch after being active.  Give your body time to rest between periods of activity.  Maintain physical fitness, including strength and flexibility.  Be safe and responsible while being active. This will help you to avoid falls.  Wear knee pads if you have to kneel for a long period of time. Contact a health care provider if:  Your symptoms do not improve or get worse.  Your symptoms keep coming back after treatment.  You develop a fever and have warmth, redness, or swelling over your knee. Summary  Prepatellar bursitis is inflammation of the prepatellar bursa, which is a fluid-filled sac that cushions the kneecap (patella).  This condition may be caused by injury or constant pressure on the knee. It may also be caused by an infection from bacteria.  Symptoms of this condition include pain, swelling, warmth, and tenderness in the knee.  Follow instructions from your health care provider about taking medicines, resting, and doing activities.  Contact your health care provider if your symptoms do not improve, get worse, or keep coming back after treatment. This information is not intended to replace advice given to you by your health care provider. Make sure you discuss any questions you have with your health care provider. Document Revised: 05/01/2018 Document Reviewed: 03/19/2018 Elsevier Patient Education  [place].

## 2019-06-07 NOTE — Progress Notes (Signed)
Subjective:   I, Phillip Butler, am serving as a scribe for Dr. Clementeen Graham.  I'm seeing this patient as a consultation for: Phillip Barre, MD. Note will be routed back to referring provider/PCP.  CC: L Leg pain   HPI: Patient is a 59 year old male presenting to [facility] today for L Leg pain and swelling. Patient was referred to [place] by Dr. Jonny Ruiz where patient reported worsening le edema left > right worse since a fall to the left knee with large effusion today and abrasion; also mentions only taking the eliquis once daily for some reason.  Also has worsening redness especially to the post calf area. Patient had a [place] to check for DVT which came back negative. Today patient reports Leg pain going on a couple weeks swollen red and warm.   He fell and scraped his left knee a few weeks ago.  He was seen by PCP who was concerned for DVT and cellulitis and prescribed doxycycline and arrange for ultrasound for DVT as above.  DVT ultrasound also did not show a Baker's cyst.  Patient does not think that his medications have changed recently.  He continues to take furosemide for his bilateral lower extremity edema.  He notes also that his legs become very itchy recently as they have gotten more swollen and has been scratching them a lot.  He is transfer over-the-counter skin cream which is helped a little.  Past medical history, Surgical history, Family history, Social history, Allergies, and medications have been entered into the medical record, reviewed.   Review of Systems: No new headache, visual changes, nausea, vomiting, diarrhea, constipation, dizziness, abdominal pain, skin rash, fevers, chills, night sweats, weight loss, swollen lymph nodes, body aches, joint swelling, muscle aches, chest pain, shortness of breath, mood changes, visual or auditory hallucinations.   Objective:    Vitals:   06/07/19 1447  BP: 130/70  Pulse: 81  SpO2: 99%   General: Morbidly obese on  nasal cannula oxygen, in no acute distress.  Neuro/Psych: Alert and oriented x3, extra-ocular muscles intact, able to move all 4 extremities, sensation grossly intact. Skin: Warm and dry, no rashes noted.  Respiratory: Not using accessory muscles, speaking in full sentences, trachea midline.  Cardiovascular: Pulses palpable, no extremity edema. Abdomen: Does not appear distended. MSK: Bilateral lower extremities are enlarged with 2+ nonpitting edema.  Mild erythema present bilateral lower extremities with excoriations on the right. Abrasion present overlying left anterior knee.  No surrounding erythema at the abrasion. Knee is not particularly tender to palpation. Capillary refill and sensation are intact distally however diminished.        Lab and Radiology Results Summary of vascular lower extremity ultrasound Jun 02, 2019 Summary:  RIGHT:  - No evidence of common femoral vein obstruction.    LEFT:  - No evidence of deep vein thrombosis in the lower extremity. No indirect  evidence of obstruction proximal to the inguinal ligament.  - No cystic structure found in the popliteal fossa.  - There is moderate superficial edema seen in the posterior and medial  calf.    *See table(s) above for measurements and observations.   Electronically signed by Nanetta Batty MD on 06/03/2019 at 3:26:53 PM.     Diagnostic Limited MSK Ultrasound of: Left anterior knee Quad tendon intact.  No significant joint effusion present. Patellar tendon is intact however large hypoechoic structure overlying patellar tendon consistent with prepatellar bursitis Impression: Prepatellar bursitis left knee  Impression and Recommendations:    Assessment and Plan: 59 y.o. male with bilateral lower extremity edema.  Fortunately no evidence of DVT with vascular ultrasound.  Looks like worsening edema due to underlying medical condition.  Heart failure is listed on his chart.  Recommend follow-up with PCP  in near future for this.  Additionally there was some concern for cellulitis which seems to be improving with doxycycline.  Recommend continuing doxycycline..  However pertinent to my care today is evidence of prepatellar bursitis left knee on ultrasound.  He has an abrasion and a fall history.  This is likely traumatic prepatellar bursitis however given his recent treated cellulitis I would like to avoid attempted to aspirate it for now.  I am concerned that if it is not septic it will likely become septic during the aspiration attempt in clinic.  Recommend completing course of doxycycline and recheck in a week.  Additionally patient has developing venous stasis dermatitis and itchy skin with areas of excoriation.  These are likely to become infected if he continues to scratch.  Prescribed triamcinolone cream topically which which should help with the itching.  Again recheck in a week.   Orders Placed This Encounter  Procedures  . [place] LIMITED JOINT SPACE STRUCTURES LOW LEFT    Standing Status:   Future    Number of Occurrences:   1    Standing Expiration Date:   08/06/2020    Order Specific Question:   Reason for Exam (SYMPTOM  OR DIAGNOSIS REQUIRED)    Answer:   Left leg pain    Order Specific Question:   Preferred imaging location?    Answer:   [place]   Meds ordered this encounter  Medications  . triamcinolone cream (KENALOG) 0.1 %    Sig: Apply 1 application topically 2 (two) times daily. To legs for itching    Dispense:  453.6 g    Refill:  0    Discussed warning signs or symptoms. Please see discharge instructions. Patient expresses understanding.   The above documentation has been reviewed and is accurate and complete Lynne Leader, M.D.

## 2019-06-08 ENCOUNTER — Ambulatory Visit: Payer: Medicare Other | Admitting: Sports Medicine

## 2019-06-11 ENCOUNTER — Ambulatory Visit: Payer: Medicare Other | Admitting: Family Medicine

## 2019-06-11 DIAGNOSIS — Z23 Encounter for immunization: Secondary | ICD-10-CM | POA: Diagnosis not present

## 2019-06-15 ENCOUNTER — Ambulatory Visit (INDEPENDENT_AMBULATORY_CARE_PROVIDER_SITE_OTHER): Payer: Medicare Other | Admitting: Family Medicine

## 2019-06-15 ENCOUNTER — Encounter: Payer: Self-pay | Admitting: Family Medicine

## 2019-06-15 ENCOUNTER — Other Ambulatory Visit: Payer: Self-pay

## 2019-06-15 ENCOUNTER — Ambulatory Visit: Payer: Self-pay

## 2019-06-15 VITALS — BP 130/80 | HR 75 | Ht 70.0 in | Wt 342.0 lb

## 2019-06-15 DIAGNOSIS — S8002XD Contusion of left knee, subsequent encounter: Secondary | ICD-10-CM | POA: Diagnosis not present

## 2019-06-15 DIAGNOSIS — M7042 Prepatellar bursitis, left knee: Secondary | ICD-10-CM

## 2019-06-15 NOTE — Patient Instructions (Signed)
Thank you for coming in today. Recheck in 3 weeks.  Let me know if the pain returns.  Contact Dr Jonny Ruiz if the legs do not go down.  Keep me updated.   The fluid in your knee will eventually get more liquidy and easier to drain.

## 2019-06-15 NOTE — Progress Notes (Signed)
342.0 l        I, Molly Weber, LAT, ATC, am serving as scribe for Dr. Clementeen Graham.  Phillip Butler. is a 59 y.o. male who presents to [facility] at [facility] today for f/u of B LE pain and swelling and L knee pain/abrasion.  He was last seen by Dr. Denyse Amass on 06/07/19 and was advised to complete his course of doxycycline that had been previously prescribed.  He was also advised to use triamcinolone cream for his L knee abrasion.  Since his last visit, pt reports that he's feeling about the same as at his last visit.  He has finished his doxycycline and con't to take the triamcinolone cream.    Diagnostic testing: L LE venous doppler- 06/02/19;    Pertinent review of systems: No fevers or chills  Relevant historical information: Hypertension, heart failure   Exam:  BP 130/80 (BP Location: Right Arm, Patient Position: Sitting, Cuff Size: Large)   Pulse 75   Ht 5\' 10"  (1.778 m)   Wt (!) 342 lb (155.1 kg)   SpO2 96%   BMI 49.07 kg/m  General: Well Developed, well nourished, and in no acute distress.   MSK: Left knee swelling overlying anterior portion of left knee. Healing abrasion with no surrounding skin erythema. Bilateral lower extremities are large with 2+ nonpitting edema. Excoriations present bilateral lower extremities with no surrounding erythema.    Lab and Radiology Results Procedure: Real-time Ultrasound Guided attempted aspiration and injection of left knee prepatellar bursitis Device: Philips Affiniti 50G Images permanently stored and available for review in the ultrasound unit. Verbal informed consent obtained.  Discussed risks and benefits of procedure. Warned about infection bleeding damage to structures skin hypopigmentation and fat atrophy among others. Patient expresses understanding and agreement Time-out conducted.   Noted no overlying erythema, induration, or other signs of local infection.   On ultrasound examination prior to injection:  hypoechoic structure present anterior left knee superficial to patellar tendon consistent with prepatellar bursitis.  No loculations present. Skin prepped in a sterile fashion.   Local anesthesia: Topical Ethyl chloride.   With sterile technique and under real time ultrasound guidance:  1 mL of lidocaine injected subcutaneously and along planned aspiration tract achieving good anesthesia.   The skin was again sterilized. 18-gauge needle was used to access the prepatellar bursa. Small amount of dark blood was aspirated.  Needle was rotated and again confirmed under ultrasound guidance that was in the bursa and free of any surrounding synovium.  Again attempted aspiration and no significant return.  This was done several times without any significant return. Compression dressing applied. Advised to call if fevers/chills, erythema, induration, drainage, or persistent bleeding.   Images permanently stored and available for review in the ultrasound unit.  Impression: Unsuccessful aspiration of presumed prepatellar bursitis.  After unsuccessful attempt my diagnosis is that this was a coagulated hematoma and not seroma.         Assessment and Plan: 59 y.o. male with left knee hematoma/traumatic prepatellar bursitis. Attempted aspiration and injection.  However not able to get significant amount of fluid back.  Based on the amount of material I was able to get back which was less than 1 mlI think that he has a coagulated hematoma that has not yet turned into a seroma.  Based on his edema of his lower leg I do not think that trying to incise and evacuate the hematoma is a good idea.  Plan to apply  dressing and recheck in about 3 weeks.  Would consider reattempted aspiration at that point.    Orders Placed This Encounter  Procedures  . [place] LIMITED JOINT SPACE STRUCTURES LOW LEFT(NO LINKED CHARGES)    Order Specific Question:   Reason for Exam (SYMPTOM  OR DIAGNOSIS REQUIRED)    Answer:   L knee  pain    Order Specific Question:   Preferred imaging location?    Answer:   [place]   No orders of the defined types were placed in this encounter.    Discussed warning signs or symptoms. Please see discharge instructions. Patient expresses understanding.   The above documentation has been reviewed and is accurate and complete Lynne Leader, M.D.

## 2019-07-06 ENCOUNTER — Other Ambulatory Visit: Payer: Self-pay

## 2019-07-06 ENCOUNTER — Encounter: Payer: Self-pay | Admitting: Family Medicine

## 2019-07-06 ENCOUNTER — Ambulatory Visit (INDEPENDENT_AMBULATORY_CARE_PROVIDER_SITE_OTHER): Payer: Medicare Other | Admitting: Family Medicine

## 2019-07-06 ENCOUNTER — Ambulatory Visit: Payer: Self-pay

## 2019-07-06 VITALS — BP 142/78 | HR 75 | Ht 70.0 in | Wt 331.0 lb

## 2019-07-06 DIAGNOSIS — I872 Venous insufficiency (chronic) (peripheral): Secondary | ICD-10-CM

## 2019-07-06 DIAGNOSIS — J351 Hypertrophy of tonsils: Secondary | ICD-10-CM

## 2019-07-06 DIAGNOSIS — M79662 Pain in left lower leg: Secondary | ICD-10-CM | POA: Diagnosis not present

## 2019-07-06 DIAGNOSIS — M7989 Other specified soft tissue disorders: Secondary | ICD-10-CM | POA: Diagnosis not present

## 2019-07-06 NOTE — Progress Notes (Signed)
   Wynema Birch, am serving as a Neurosurgeon for Dr. Clementeen Graham.  Phillip Butler. is a 59 y.o. male who presents to [facility] at [facility] today for f/u of L knee pain and B LE swelling.  He was last seen by Dr. Denyse Amass on 06/07/19 and was advised to finish his antibiotic and to use triamcinolone cream on his legs for itching.  Since his last visit, pt reports Leg and knee are still swollen and itchy, but a little better. Has been using the cream to help with the itching which does help some.   He also has elongated toenails that he is having trouble trimming and would like to go back to see a podiatrist.  [place]  Diagnostic imaging: LE venous dopper [place]- 06/02/19  Pertinent review of systems: No fevers or chills  Relevant historical information: Heart failure, stasis dermatitis   Exam:  BP (!) 142/78 (BP Location: Left Arm, Patient Position: Sitting, Cuff Size: Large)   Pulse 75   Ht 5\' 10"  (1.778 m)   Wt (!) 331 lb (150.1 kg)   SpO2 95%   BMI 47.49 kg/m  General: Well Developed, well nourished, and in no acute distress.   MSK: Bilateral extremities erythematous with thickened skin consistent with venous stasis dermatitis.  Nontender. Knees bilaterally nontender normal motion.      Assessment and Plan: 59 y.o. male with  Originally knee pain after fall due to prepatellar bursitis.  This has since largely resolved and is no longer his main issue.  Watchful waiting for this and recheck back with me as needed for this.  His main issue today is his leg itching due to venous stasis dermatitis.  I prescribed triamcinolone cream at the last visit which seems to be helpful.  Recommend that he follow-up with his PCP for this for further issues if needed.  Additionally he mentioned that his toenails are long and he has difficulty trimming them himself.  Looking through his records he previously was seen by Dr. 46  (podiatry).  Refer back to her for further evaluation and management of this issue.  Check back with me as needed for his orthopedic or sports medicine related issues.   PDMP not reviewed this encounter. Orders Placed This Encounter  Procedures  . [place] LIMITED JOINT SPACE STRUCTURES LOW LEFT    Standing Status:   Future    Number of Occurrences:   1    Standing Expiration Date:   07/05/2020    Order Specific Question:   Reason for Exam (SYMPTOM  OR DIAGNOSIS REQUIRED)    Answer:   L knee pain    Order Specific Question:   Preferred imaging location?    Answer:   07/07/2020 [facility]  . Ambulatory referral to Podiatry    Referral Priority:   Routine    Referral Type:   Consultation    Referral Reason:   Specialty Services Required    Referred to Provider:   [facility], DPM    Requested Specialty:   Podiatry    Number of Visits Requested:   1   No orders of the defined types were placed in this encounter.    Discussed warning signs or symptoms. Please see discharge instructions. Patient expresses understanding.   The above documentation has been reviewed and is accurate and complete Asencion Islam, M.D.

## 2019-07-06 NOTE — Patient Instructions (Addendum)
Thank you for coming in today. Continue the use the triamcinolone cream on the legs. Follow up with Dr Jonny Ruiz for your legs.  You should hear from Dr Wynema Birch office about your toenails.  Recheck with me as needed.

## 2019-07-20 ENCOUNTER — Ambulatory Visit (INDEPENDENT_AMBULATORY_CARE_PROVIDER_SITE_OTHER): Payer: Medicare Other | Admitting: Sports Medicine

## 2019-07-20 ENCOUNTER — Encounter: Payer: Self-pay | Admitting: Sports Medicine

## 2019-07-20 ENCOUNTER — Other Ambulatory Visit: Payer: Self-pay

## 2019-07-20 VITALS — Temp 96.4°F

## 2019-07-20 DIAGNOSIS — I739 Peripheral vascular disease, unspecified: Secondary | ICD-10-CM | POA: Diagnosis not present

## 2019-07-20 DIAGNOSIS — B351 Tinea unguium: Secondary | ICD-10-CM

## 2019-07-20 DIAGNOSIS — M79674 Pain in right toe(s): Secondary | ICD-10-CM

## 2019-07-20 DIAGNOSIS — L853 Xerosis cutis: Secondary | ICD-10-CM

## 2019-07-20 DIAGNOSIS — M79675 Pain in left toe(s): Secondary | ICD-10-CM

## 2019-07-20 NOTE — Progress Notes (Signed)
Subjective: Phillip Butler. is a 59 y.o. male patient seen today in office with complaint of mildly painful thickened and elongated toenails and dry skin; unable to trim. Patient is limited in speech and does not have legal guardian present with him today.   Patient Active Problem List   Diagnosis Date Noted  . Pain and swelling of left lower leg 06/02/2019  . Traumatic arthritis of left knee 06/02/2019  . Myalgia 05/10/2019  . Right otitis media 04/17/2019  . History of DVT (deep vein thrombosis) 02/13/2019  . Pre-ulcerative corn or callous 02/09/2019  . Lump in throat 02/09/2019  . Cough 01/06/2019  . Increased abdominal girth 09/29/2018  . Rash 09/29/2018  . Hematochezia 06/25/2018  . Urinary stream slowing 12/13/2017  . Plantar fasciitis, left 09/23/2017  . Conjunctivitis of left eye 06/11/2017  . Left ear hearing loss 06/11/2017  . Skin lesion 06/11/2017  . Hyperglycemia 06/11/2017  . Cellulitis 07/25/2016  . 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 Right leg pain 06/20/2016  . Left ankle pain 06/13/2016  . Acute sinus infection 04/12/2016  . RLS (restless legs syndrome) 04/12/2016  . Respiratory abnormality   . Obesity (BMI 30-39.9) 03/02/2015  . Acute on chronic diastolic CHF (congestive heart failure) (HCC)   . Autism   . DVT, lower extremity, distal, acute (HCC)   . Closed trimalleolar fracture of left ankle 02/26/2015  . Hyperlipidemia 02/19/2014  . Depression with anxiety 02/19/2014  . Acute respiratory failure with hypoxia (HCC) 02/19/2014  . Normocytic anemia 02/19/2014  . Community acquired pneumonia 02/18/2014  . Left foot pain 02/09/2013  . Urinary frequency 02/03/2012  . Bilateral hearing loss 02/03/2012  . Preventative health care 11/28/2010  . Obstructive sleep apnea 08/14/2009  . Essential hypertension 02/16/2007  . Allergic rhinitis 02/16/2007  . GERD 02/16/2007    Current Outpatient Medications on File Prior to Visit  Medication  Sig Dispense Refill  . apixaban (ELIQUIS) 5 MG TABS tablet Take 1 tablet (5 mg total) by mouth 2 (two) times daily. 180 tablet 3  . albuterol (VENTOLIN HFA) 108 (90 Base) MCG/ACT inhaler Inhale 2 puffs into the lungs every 6 (six) hours as needed for wheezing or shortness of breath. 3 Inhaler 3  . aspirin EC 81 MG tablet Take 81 mg by mouth daily.    Marland Kitchen atorvastatin (LIPITOR) 40 MG tablet Take 1 tablet (40 mg total) by mouth daily. 90 tablet 3  . buPROPion (WELLBUTRIN XL) 300 MG 24 hr tablet Take 1 tablet (300 mg total) by mouth daily. 90 tablet 3  . cholecalciferol (VITAMIN D3) 25 MCG (1000 UNIT) tablet Take 1,000 Units by mouth daily.    Marland Kitchen doxycycline (VIBRA-TABS) 100 MG tablet Take 1 tablet (100 mg total) by mouth 2 (two) times daily. 20 tablet 0  . fexofenadine (ALLEGRA) 180 MG tablet Take 1 tablet (180 mg total) by mouth daily. 30 tablet 2  . furosemide (LASIX) 40 MG tablet Take 1 tablet (40 mg total) by mouth daily. 90 tablet 3  . gabapentin (NEURONTIN) 300 MG capsule Take 2 capsules (600 mg total) by mouth at bedtime. 180 capsule 1  . hydroxypropyl methylcellulose / hypromellose (ISOPTO TEARS / GONIOVISC) 2.5 % ophthalmic solution Place 1 drop into both eyes 3 (three) times daily as needed for dry eyes.    Marland Kitchen lisinopril (ZESTRIL) 20 MG tablet Take 1 tablet (20 mg total) by mouth daily. 90 tablet 3  . metoprolol tartrate (LOPRESSOR) 25 MG tablet Take 0.5 tablets (  12.5 mg total) by mouth 2 (two) times daily. 90 tablet 3  . nystatin (MYCOSTATIN/NYSTOP) powder Use as directed twice daily as needed for groin and scrotal rash 45 g 1  . omeprazole (PRILOSEC) 20 MG capsule Take 1 capsule (20 mg total) by mouth daily. 90 capsule 3  . sertraline (ZOLOFT) 100 MG tablet Take 2 tablets (200 mg total) by mouth daily. 180 tablet 3  . tamsulosin (FLOMAX) 0.4 MG CAPS capsule Take 1 capsule (0.4 mg total) by mouth 2 (two) times daily. 180 capsule 3  . triamcinolone (NASACORT) 55 MCG/ACT AERO nasal inhaler Place  2 sprays into the nose daily. 1 Inhaler 12  . triamcinolone cream (KENALOG) 0.1 % Apply 1 application topically 2 (two) times daily. To legs for itching 453.6 g 0  . vitamin B-12 (CYANOCOBALAMIN) 1000 MCG tablet Take 1,000 mcg by mouth daily.     No current facility-administered medications on file prior to visit.    Allergies  Allergen Reactions  . Fluoxetine Hcl Other (See Comments)    ineffective  . Xanax [Alprazolam] Other (See Comments)    Made patient too sleepy    Objective: Physical Exam  General: Well developed, nourished, no acute distress, awake, alert and oriented x2  Vascular: Dorsalis pedis artery 1/4 bilateral, Posterior tibial artery 0/4 bilateral, skin temperature warm to warm proximal to distal bilateral lower extremities, trophic skin changes bilateral, no pedal hair present.  Neurological: Gross sensation present via light touch bilateral.   Dermatological: Skin is warm, dry, and supple bilateral, Nails 1-10 are tender, long, thick, and discolored with mild subungal debris, no webspace macerations present bilateral, no open lesions present bilateral, no callus/corns/hyperkeratotic tissue present bilateral.  Dry skin to plantar heels.  No signs of infection bilateral.  Musculoskeletal: Asymptomatic pes planus boney deformities noted bilateral. Muscular strength within normal limits without painon range of motion. No pain with calf compression bilateral.  Assessment and Plan:  Problem List Items Addressed This Visit    None    Visit Diagnoses    Pain due to onychomycosis of toenails of both feet    -  Primary   Dry skin       PVD (peripheral vascular disease) (HCC)          -Examined patient.  -Discussed treatment options for painful mycotic nails. -Mechanically debrided and reduced mycotic nails with sterile nail nipper and dremel nail file without incident. -Recommend daily skin emollients for dry skin gave sample of foot miracle cream -Patient to return  in 3 months for follow up evaluation or sooner if symptoms worsen.  Asencion Islam, DPM

## 2019-10-21 ENCOUNTER — Ambulatory Visit: Payer: Medicare Other | Admitting: Sports Medicine

## 2019-11-25 ENCOUNTER — Ambulatory Visit (INDEPENDENT_AMBULATORY_CARE_PROVIDER_SITE_OTHER): Payer: Medicare Other | Admitting: Sports Medicine

## 2019-11-25 ENCOUNTER — Other Ambulatory Visit: Payer: Self-pay

## 2019-11-25 ENCOUNTER — Encounter: Payer: Self-pay | Admitting: Sports Medicine

## 2019-11-25 DIAGNOSIS — M79675 Pain in left toe(s): Secondary | ICD-10-CM

## 2019-11-25 DIAGNOSIS — B351 Tinea unguium: Secondary | ICD-10-CM

## 2019-11-25 DIAGNOSIS — L853 Xerosis cutis: Secondary | ICD-10-CM

## 2019-11-25 DIAGNOSIS — I739 Peripheral vascular disease, unspecified: Secondary | ICD-10-CM | POA: Diagnosis not present

## 2019-11-25 DIAGNOSIS — M79674 Pain in right toe(s): Secondary | ICD-10-CM

## 2019-11-25 NOTE — Progress Notes (Signed)
Subjective: Phillip Butler. is a 59 y.o. male patient seen today in office with complaint of mildly painful thickened and elongated toenails and dry skin; unable to trim. Patient is limited in speech and does not have legal guardian present with him today. No other issues noted.  Patient Active Problem List   Diagnosis Date Noted  . Pain and swelling of left lower leg 06/02/2019  . Traumatic arthritis of left knee 06/02/2019  . Myalgia 05/10/2019  . Right otitis media 04/17/2019  . History of DVT (deep vein thrombosis) 02/13/2019  . Pre-ulcerative corn or callous 02/09/2019  . Lump in throat 02/09/2019  . Cough 01/06/2019  . Increased abdominal girth 09/29/2018  . Rash 09/29/2018  . Hematochezia 06/25/2018  . Urinary stream slowing 12/13/2017  . Plantar fasciitis, left 09/23/2017  . Conjunctivitis of left eye 06/11/2017  . Left ear hearing loss 06/11/2017  . Skin lesion 06/11/2017  . Hyperglycemia 06/11/2017  . Cellulitis 07/25/2016  . 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 Right leg pain 06/20/2016  . Left ankle pain 06/13/2016  . Acute sinus infection 04/12/2016  . RLS (restless legs syndrome) 04/12/2016  . Respiratory abnormality   . Obesity (BMI 30-39.9) 03/02/2015  . Acute on chronic diastolic CHF (congestive heart failure) (HCC)   . Autism   . DVT, lower extremity, distal, acute (HCC)   . Closed trimalleolar fracture of left ankle 02/26/2015  . Hyperlipidemia 02/19/2014  . Depression with anxiety 02/19/2014  . Acute respiratory failure with hypoxia (HCC) 02/19/2014  . Normocytic anemia 02/19/2014  . Community acquired pneumonia 02/18/2014  . Left foot pain 02/09/2013  . Urinary frequency 02/03/2012  . Bilateral hearing loss 02/03/2012  . Preventative health care 11/28/2010  . Obstructive sleep apnea 08/14/2009  . Essential hypertension 02/16/2007  . Allergic rhinitis 02/16/2007  . GERD 02/16/2007    Current Outpatient Medications on File Prior  to Visit  Medication Sig Dispense Refill  . albuterol (VENTOLIN HFA) 108 (90 Base) MCG/ACT inhaler Inhale 2 puffs into the lungs every 6 (six) hours as needed for wheezing or shortness of breath. 3 Inhaler 3  . apixaban (ELIQUIS) 5 MG TABS tablet Take 1 tablet (5 mg total) by mouth 2 (two) times daily. 180 tablet 3  . aspirin EC 81 MG tablet Take 81 mg by mouth daily.    Marland Kitchen atorvastatin (LIPITOR) 40 MG tablet Take 1 tablet (40 mg total) by mouth daily. 90 tablet 3  . buPROPion (WELLBUTRIN XL) 300 MG 24 hr tablet Take 1 tablet (300 mg total) by mouth daily. 90 tablet 3  . cholecalciferol (VITAMIN D3) 25 MCG (1000 UNIT) tablet Take 1,000 Units by mouth daily.    Marland Kitchen doxycycline (VIBRA-TABS) 100 MG tablet Take 1 tablet (100 mg total) by mouth 2 (two) times daily. 20 tablet 0  . fexofenadine (ALLEGRA) 180 MG tablet Take 1 tablet (180 mg total) by mouth daily. 30 tablet 2  . furosemide (LASIX) 40 MG tablet Take 1 tablet (40 mg total) by mouth daily. 90 tablet 3  . gabapentin (NEURONTIN) 300 MG capsule Take 2 capsules (600 mg total) by mouth at bedtime. 180 capsule 1  . hydroxypropyl methylcellulose / hypromellose (ISOPTO TEARS / GONIOVISC) 2.5 % ophthalmic solution Place 1 drop into both eyes 3 (three) times daily as needed for dry eyes.    Marland Kitchen lisinopril (ZESTRIL) 20 MG tablet Take 1 tablet (20 mg total) by mouth daily. 90 tablet 3  . metoprolol tartrate (LOPRESSOR) 25 MG tablet  Take 0.5 tablets (12.5 mg total) by mouth 2 (two) times daily. 90 tablet 3  . nystatin (MYCOSTATIN/NYSTOP) powder Use as directed twice daily as needed for groin and scrotal rash 45 g 1  . omeprazole (PRILOSEC) 20 MG capsule Take 1 capsule (20 mg total) by mouth daily. 90 capsule 3  . sertraline (ZOLOFT) 100 MG tablet Take 2 tablets (200 mg total) by mouth daily. 180 tablet 3  . tamsulosin (FLOMAX) 0.4 MG CAPS capsule Take 1 capsule (0.4 mg total) by mouth 2 (two) times daily. 180 capsule 3  . triamcinolone (NASACORT) 55 MCG/ACT  AERO nasal inhaler Place 2 sprays into the nose daily. 1 Inhaler 12  . triamcinolone cream (KENALOG) 0.1 % Apply 1 application topically 2 (two) times daily. To legs for itching 453.6 g 0  . vitamin B-12 (CYANOCOBALAMIN) 1000 MCG tablet Take 1,000 mcg by mouth daily.     No current facility-administered medications on file prior to visit.    Allergies  Allergen Reactions  . Fluoxetine Hcl Other (See Comments)    ineffective  . Xanax [Alprazolam] Other (See Comments)    Made patient too sleepy    Objective: Physical Exam  General: Well developed, nourished, no acute distress, awake, alert and oriented x2  Vascular: Dorsalis pedis artery 1/4 bilateral, Posterior tibial artery 0/4 bilateral, skin temperature warm to warm proximal to distal bilateral lower extremities, trophic skin changes bilateral, no pedal hair present.  Neurological: Gross sensation present via light touch bilateral.   Dermatological: Skin is warm, dry, and supple bilateral, Nails 1-10 are tender, long, thick, and discolored with mild subungal debris, no webspace macerations present bilateral, no open lesions present bilateral, no callus/corns/hyperkeratotic tissue present bilateral.  Dry skin to plantar heels.  No signs of infection bilateral.  Musculoskeletal: Asymptomatic pes planus boney deformities noted bilateral. Muscular strength within normal limits without painon range of motion. No pain with calf compression bilateral.  Assessment and Plan:  Problem List Items Addressed This Visit    None    Visit Diagnoses    Pain due to onychomycosis of toenails of both feet    -  Primary   Dry skin       PVD (peripheral vascular disease) (HCC)          -Examined patient.  -Discussed treatment options for painful mycotic nails. -Mechanically debrided and reduced mycotic nails with sterile nail nipper and dremel nail file without incident. -Recommend daily skin emollients for dry skin gave sample of foot miracle  cream again this visit -Patient to return in 3 months for follow up evaluation or sooner if symptoms worsen.  Asencion Islam, DPM

## 2019-12-24 ENCOUNTER — Encounter (HOSPITAL_COMMUNITY): Payer: Self-pay

## 2019-12-24 ENCOUNTER — Emergency Department (HOSPITAL_COMMUNITY)
Admission: EM | Admit: 2019-12-24 | Discharge: 2019-12-24 | Disposition: A | Discharge status: Home / Self Care | Payer: Medicare Other | Attending: Emergency Medicine | Admitting: Emergency Medicine

## 2019-12-24 ENCOUNTER — Other Ambulatory Visit: Payer: Self-pay

## 2019-12-24 ENCOUNTER — Emergency Department (HOSPITAL_COMMUNITY): Payer: Medicare Other

## 2019-12-24 DIAGNOSIS — W228XXA Striking against or struck by other objects, initial encounter: Secondary | ICD-10-CM | POA: Diagnosis not present

## 2019-12-24 DIAGNOSIS — J45909 Unspecified asthma, uncomplicated: Secondary | ICD-10-CM | POA: Insufficient documentation

## 2019-12-24 DIAGNOSIS — I5033 Acute on chronic diastolic (congestive) heart failure: Secondary | ICD-10-CM | POA: Insufficient documentation

## 2019-12-24 DIAGNOSIS — Z79899 Other long term (current) drug therapy: Secondary | ICD-10-CM | POA: Diagnosis not present

## 2019-12-24 DIAGNOSIS — F840 Autistic disorder: Secondary | ICD-10-CM | POA: Diagnosis not present

## 2019-12-24 DIAGNOSIS — I110 Hypertensive heart disease with heart failure: Secondary | ICD-10-CM | POA: Insufficient documentation

## 2019-12-24 DIAGNOSIS — M7989 Other specified soft tissue disorders: Secondary | ICD-10-CM | POA: Diagnosis not present

## 2019-12-24 DIAGNOSIS — Z7982 Long term (current) use of aspirin: Secondary | ICD-10-CM | POA: Insufficient documentation

## 2019-12-24 DIAGNOSIS — S90812A Abrasion, left foot, initial encounter: Secondary | ICD-10-CM | POA: Diagnosis not present

## 2019-12-24 DIAGNOSIS — S91332A Puncture wound without foreign body, left foot, initial encounter: Secondary | ICD-10-CM | POA: Diagnosis not present

## 2019-12-24 MED ORDER — ACETAMINOPHEN 325 MG PO TABS
650.0000 mg | ORAL_TABLET | Freq: Once | ORAL | Status: AC
  Administered 2019-12-24: 650 mg via ORAL
  Filled 2019-12-24: qty 2

## 2019-12-24 NOTE — ED Provider Notes (Signed)
[facility]-EMERGENCY DEPT Provider Note   CSN: [id number] Arrival date & time: 12/24/19  0211     History No chief complaint on file.   Phillip OhmCharles G Chichester Jr. is a 59 y.o. male with a history of autism spectrum disorder, anxiety, asthma, obesity, DVT on Eliquis and onychomycosis who presents the emergency department with a chief complaint of foot wound.  The patient reports that he stepped on an unknown sharp object earlier tonight with his left foot.  He denies that the object went through his shoe.  He is unsure what the object was that cut him.  He was brought in by his legal guardian who is not at bedside at this time.  Unfortunately, they were unable to stop the bleeding at home.  He is endorsing pain to the sole of the left foot.  He denies numbness, weakness, left ankle pain, fever, chills, dizziness, or lightheadedness.  He applied a bandage to the bottom of the foot.  No other treatment prior to arrival.  Per chart review, Tdap was last updated in 2019.   The history is provided by the patient and medical records. No language interpreter was used.       Past Medical History:  Diagnosis Date  . Allergic rhinitis   . Anemia of chronic disease   . Anxiety   . Asthma   . Autism   . 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 Depression   . Gallstone   . GERD (gastroesophageal reflux disease)   . Hyperlipidemia   . Hypertension   . IBS (irritable bowel syndrome)   . Obesity   . OSA (obstructive sleep apnea)   . Pneumonia   . UTI (urinary tract infection)     Patient Active Problem List   Diagnosis Date Noted  . Pain and swelling of left lower leg 06/02/2019  . Traumatic arthritis of left knee 06/02/2019  . Myalgia 05/10/2019  . Right otitis media 04/17/2019  . History of DVT (deep vein thrombosis) 02/13/2019  . Pre-ulcerative corn or callous 02/09/2019  . Lump in throat 02/09/2019  . Cough 01/06/2019  . Increased abdominal girth 09/29/2018  . Rash  09/29/2018  . Hematochezia 06/25/2018  . Urinary stream slowing 12/13/2017  . Plantar fasciitis, left 09/23/2017  . Conjunctivitis of left eye 06/11/2017  . Left ear hearing loss 06/11/2017  . Skin lesion 06/11/2017  . Hyperglycemia 06/11/2017  . Cellulitis 07/25/2016  . 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 Right leg pain 06/20/2016  . Left ankle pain 06/13/2016  . Acute sinus infection 04/12/2016  . RLS (restless legs syndrome) 04/12/2016  . Respiratory abnormality   . Obesity (BMI 30-39.9) 03/02/2015  . Acute on chronic diastolic CHF (congestive heart failure) (HCC)   . Autism   . DVT, lower extremity, distal, acute (HCC)   . Closed trimalleolar fracture of left ankle 02/26/2015  . Hyperlipidemia 02/19/2014  . Depression with anxiety 02/19/2014  . Acute respiratory failure with hypoxia (HCC) 02/19/2014  . Normocytic anemia 02/19/2014  . Community acquired pneumonia 02/18/2014  . Left foot pain 02/09/2013  . Urinary frequency 02/03/2012  . Bilateral hearing loss 02/03/2012  . Preventative health care 11/28/2010  . Obstructive sleep apnea 08/14/2009  . Essential hypertension 02/16/2007  . Allergic rhinitis 02/16/2007  . GERD 02/16/2007    Past Surgical History:  Procedure Laterality Date  . ORIF ANKLE FRACTURE Left 03/02/2015   Procedure: OPEN REDUCTION INTERNAL FIXATION (ORIF) LEFT ANKLE TRIMALLEOLAR FRACTURE;  Surgeon: Jonny RuizJohn  Victorino Dike, MD;  Location: [facility] OR;  Service: Orthopedics;  Laterality: Left;       Family History  Problem Relation Age of Onset  . Irritable bowel syndrome Mother   . Heart disease Father   . Alcohol abuse Other   . Arthritis Other   . Breast cancer Other   . Prostate cancer Other   . Hyperlipidemia Other   . Heart disease Other   . Stroke Other   . Kidney disease Other   . Diabetes Other   . Pancreatic cancer Other   . Colon cancer Neg Hx   . Esophageal cancer Neg Hx     Social History   Tobacco Use  . Smoking status:  Never Smoker  . Smokeless tobacco: Never Used  Vaping Use  . Vaping Use: Never used  Substance Use Topics  . Alcohol use: No  . Drug use: No    Home Medications Prior to Admission medications   Medication Sig Start Date End Date Taking? Authorizing Provider  albuterol (VENTOLIN HFA) 108 (90 Base) MCG/ACT inhaler Inhale 2 puffs into the lungs every 6 (six) hours as needed for wheezing or shortness of breath. 06/25/18   Corwin Levins, MD  apixaban (ELIQUIS) 5 MG TABS tablet Take 1 tablet (5 mg total) by mouth 2 (two) times daily. 03/29/19   Corwin Levins, MD  aspirin EC 81 MG tablet Take 81 mg by mouth daily.    [provider]  atorvastatin (LIPITOR) 40 MG tablet Take 1 tablet (40 mg total) by mouth daily. 03/29/19   Corwin Levins, MD  buPROPion (WELLBUTRIN XL) 300 MG 24 hr tablet Take 1 tablet (300 mg total) by mouth daily. 04/16/19   Corwin Levins, MD  cholecalciferol (VITAMIN D3) 25 MCG (1000 UNIT) tablet Take 1,000 Units by mouth daily.    [provider]  doxycycline (VIBRA-TABS) 100 MG tablet Take 1 tablet (100 mg total) by mouth 2 (two) times daily. 06/02/19   Corwin Levins, MD  fexofenadine (ALLEGRA) 180 MG tablet Take 1 tablet (180 mg total) by mouth daily. 03/29/19 03/28/20  Corwin Levins, MD  furosemide (LASIX) 40 MG tablet Take 1 tablet (40 mg total) by mouth daily. 03/29/19 06/27/19  Corwin Levins, MD  gabapentin (NEURONTIN) 300 MG capsule Take 2 capsules (600 mg total) by mouth at bedtime. 03/29/19   Corwin Levins, MD  hydroxypropyl methylcellulose / hypromellose (ISOPTO TEARS / GONIOVISC) 2.5 % ophthalmic solution Place 1 drop into both eyes 3 (three) times daily as needed for dry eyes.    [provider]  lisinopril (ZESTRIL) 20 MG tablet Take 1 tablet (20 mg total) by mouth daily. 03/29/19   Corwin Levins, MD  metoprolol tartrate (LOPRESSOR) 25 MG tablet Take 0.5 tablets (12.5 mg total) by mouth 2 (two) times daily. 03/29/19 06/27/19  Corwin Levins, MD  nystatin  (MYCOSTATIN/NYSTOP) powder Use as directed twice daily as needed for groin and scrotal rash 04/16/19   Corwin Levins, MD  omeprazole (PRILOSEC) 20 MG capsule Take 1 capsule (20 mg total) by mouth daily. 03/29/19   Corwin Levins, MD  sertraline (ZOLOFT) 100 MG tablet Take 2 tablets (200 mg total) by mouth daily. 03/29/19   Corwin Levins, MD  tamsulosin (FLOMAX) 0.4 MG CAPS capsule Take 1 capsule (0.4 mg total) by mouth 2 (two) times daily. 03/29/19   Corwin Levins, MD  triamcinolone (NASACORT) 55 MCG/ACT AERO nasal inhaler Place 2 sprays into  the nose daily. 03/29/19   Corwin Levins, MD  triamcinolone cream (KENALOG) 0.1 % Apply 1 application topically 2 (two) times daily. To legs for itching 06/07/19   Rodolph Bong, MD  vitamin B-12 (CYANOCOBALAMIN) 1000 MCG tablet Take 1,000 mcg by mouth daily.    [provider]    Allergies    Fluoxetine hcl and Xanax [alprazolam]  Review of Systems   Review of Systems  Constitutional: Negative for appetite change and fever.  HENT: Negative for congestion and sore throat.   Respiratory: Negative for shortness of breath.   Cardiovascular: Negative for chest pain.  Gastrointestinal: Negative for abdominal pain, diarrhea, nausea and vomiting.  Genitourinary: Negative for dysuria.  Musculoskeletal: Positive for arthralgias and myalgias. Negative for back pain.  Skin: Positive for wound. Negative for color change and rash.  Allergic/Immunologic: Negative for immunocompromised state.  Neurological: Negative for syncope, weakness, numbness and headaches.  Psychiatric/Behavioral: Negative for confusion.    Physical Exam Updated Vital Signs BP (!) 179/99 (BP Location: Right Arm)   Pulse 93   Temp 98.4 F (36.9 C)   Resp 20   SpO2 92%   Physical Exam Vitals and nursing note reviewed.  Constitutional:      General: He is not in acute distress.    Appearance: He is well-developed. He is obese. He is not ill-appearing, toxic-appearing or diaphoretic.    HENT:     Head: Normocephalic.  Eyes:     Conjunctiva/sclera: Conjunctivae normal.  Cardiovascular:     Rate and Rhythm: Normal rate and regular rhythm.     Heart sounds: No murmur heard.   Pulmonary:     Effort: Pulmonary effort is normal.  Abdominal:     General: There is no distension.     Palpations: Abdomen is soft.  Musculoskeletal:     Cervical back: Neck supple.     Comments: Dried blood is noted to the sole of the left foot.  A Band-Aid was placed over the sole of the great toe.  No active bleeding at this time.  He is tender to palpation to the sole of the great toe.  Full active and passive range of motion of the left ankle.  He is neurovascular intact throughout the bilateral lower extremities.  No obvious lacerations.  Skin:    General: Skin is warm and dry.  Neurological:     Mental Status: He is alert.  Psychiatric:        Behavior: Behavior normal.     ED Results / Procedures / Treatments   Labs (all labs ordered are listed, but only abnormal results are displayed) Labs Reviewed - No data to display  EKG None  Radiology DG Foot Complete Left  Result Date: 12/24/2019 CLINICAL DATA:  Left foot injury, puncture wound. EXAM: LEFT FOOT - COMPLETE 3+ VIEW COMPARISON:  None. FINDINGS: Three view radiograph of the left foot is slightly limited by obliquity of the hindfoot. By malleolar ORIF with instrumentation is incidentally noted. There is normal alignment. No acute fracture or dislocation. Joint spaces appear preserved. There is moderate to severe soft tissue swelling of the left forefoot. No retained radiopaque foreign body identified. IMPRESSION: Soft tissue swelling.  No retained radiopaque foreign body. Electronically Signed   By: Helyn Numbers MD   On: 12/24/2019 06:33    Procedures Procedures (including critical care time)  Medications Ordered in ED Medications  acetaminophen (TYLENOL) tablet 650 mg (650 mg Oral Given 12/24/19 7322)    ED  Course  I  have reviewed the triage vital signs and the nursing notes.  Pertinent labs & imaging results that were available during my care of the patient were reviewed by me and considered in my medical decision making (see chart for details).    MDM Rules/Calculators/A&P                          59 year old male with a history of autism spectrum disorder, anxiety, asthma, obesity, DVT on Eliquis and onychomycosis who presents to the emergency department with a chief complaint of left foot wound after stepping on an unknown sharp object earlier tonight.  Patient's legal guardian is not at bedside.  He is on Eliquis.  Imaging has been reviewed and independently interpreted by me.  X-ray of the foot is unremarkable for foreign body.  On evaluation, wounds are hemostatic.  No laceration amenable to repair.  The patient's foot was irrigated and wound was cleaned in the emergency department.  He had a sterile dressing applied.  Home wound care instructions given.  ER return precautions given.  He is hemodynamically stable no acute distress.  Safe for discharge to home with outpatient follow-up as needed.  Final Clinical Impression(s) / ED Diagnoses Final diagnoses:  Abrasion of left foot, initial encounter    Rx / DC Orders ED Discharge Orders    None       Barkley Boards, PA-C 12/24/19 0905    Nira Conn, MD 12/24/19 2109

## 2019-12-24 NOTE — ED Notes (Signed)
Brother in Orthoptist called to come get pt for discharge. Advised he was on his way.

## 2019-12-24 NOTE — ED Notes (Signed)
Pt's foot washed and redressed prior to discharge.

## 2019-12-24 NOTE — Discharge Instructions (Addendum)
Thank you for allowing me to care for you today in the Emergency Department.   Your x-ray today was reassuring.  There were no foreign bodies.  Your tetanus immunization is up-to-date.  A dressing was applied to your wound.  Keep the sole of the foot clean and dry with warm water and soap.  You can keep a bandage on the foot that you change daily until the wound has healed.  You can take 650 mg of Tylenol once every 6 hours for pain control.  Your blood pressure was high today.  Please make sure that you are taking your home blood pressure medications.  Try to elevate your leg so that your toes are at or above the level of your nose help with pain and swelling.  You can apply an ice pack to areas that are sore for 15 to 20 minutes.  Return to the emergency department if you develop uncontrollable bleeding, if you become unable to walk, have new numbness or weakness, or other new, concerning symptoms.

## 2019-12-24 NOTE — ED Triage Notes (Addendum)
Pt BIB legal guardian. Pt reports cutting his left foot and reports being unable to stop the bleeding. Bleeding controlled at this time.

## 2020-01-25 ENCOUNTER — Encounter (HOSPITAL_COMMUNITY): Payer: Self-pay | Admitting: Emergency Medicine

## 2020-01-25 ENCOUNTER — Emergency Department (HOSPITAL_COMMUNITY)
Admission: EM | Admit: 2020-01-25 | Discharge: 2020-01-25 | Disposition: A | Discharge status: Home / Self Care | Payer: Medicare Other | Attending: Emergency Medicine | Admitting: Emergency Medicine

## 2020-01-25 ENCOUNTER — Other Ambulatory Visit: Payer: Self-pay

## 2020-01-25 DIAGNOSIS — Z7982 Long term (current) use of aspirin: Secondary | ICD-10-CM | POA: Insufficient documentation

## 2020-01-25 DIAGNOSIS — Z79899 Other long term (current) drug therapy: Secondary | ICD-10-CM | POA: Diagnosis not present

## 2020-01-25 DIAGNOSIS — Z9981 Dependence on supplemental oxygen: Secondary | ICD-10-CM | POA: Diagnosis present

## 2020-01-25 DIAGNOSIS — I82409 Acute embolism and thrombosis of unspecified deep veins of unspecified lower extremity: Secondary | ICD-10-CM | POA: Diagnosis not present

## 2020-01-25 DIAGNOSIS — I110 Hypertensive heart disease with heart failure: Secondary | ICD-10-CM | POA: Diagnosis not present

## 2020-01-25 DIAGNOSIS — Z7901 Long term (current) use of anticoagulants: Secondary | ICD-10-CM | POA: Diagnosis not present

## 2020-01-25 DIAGNOSIS — I5033 Acute on chronic diastolic (congestive) heart failure: Secondary | ICD-10-CM | POA: Diagnosis not present

## 2020-01-25 DIAGNOSIS — J45909 Unspecified asthma, uncomplicated: Secondary | ICD-10-CM | POA: Diagnosis not present

## 2020-01-25 DIAGNOSIS — F840 Autistic disorder: Secondary | ICD-10-CM | POA: Insufficient documentation
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
## 2020-01-25 NOTE — ED Notes (Signed)
Spoke to Mr. Darden Amber, pt's brother in law.  He will be here at 9a to pick up the Pt.

## 2020-02-28 ENCOUNTER — Other Ambulatory Visit: Payer: Self-pay | Admitting: Family Medicine

## 2020-02-28 ENCOUNTER — Other Ambulatory Visit: Payer: Self-pay | Admitting: Internal Medicine

## 2020-02-28 NOTE — Telephone Encounter (Signed)
Please advise 

## 2020-03-02 ENCOUNTER — Other Ambulatory Visit: Payer: Self-pay

## 2020-03-02 ENCOUNTER — Encounter: Payer: Self-pay | Admitting: Sports Medicine

## 2020-03-02 ENCOUNTER — Ambulatory Visit (INDEPENDENT_AMBULATORY_CARE_PROVIDER_SITE_OTHER): Payer: Medicare Other | Admitting: Sports Medicine

## 2020-03-02 DIAGNOSIS — M79674 Pain in right toe(s): Secondary | ICD-10-CM | POA: Diagnosis not present

## 2020-03-02 DIAGNOSIS — I739 Peripheral vascular disease, unspecified: Secondary | ICD-10-CM

## 2020-03-02 DIAGNOSIS — B351 Tinea unguium: Secondary | ICD-10-CM

## 2020-03-02 DIAGNOSIS — M79675 Pain in left toe(s): Secondary | ICD-10-CM | POA: Diagnosis not present

## 2020-03-02 DIAGNOSIS — L853 Xerosis cutis: Secondary | ICD-10-CM

## 2020-03-02 NOTE — Progress Notes (Signed)
Subjective: Phillip Cuthrell. is a 60 y.o. male patient seen today in office with complaint of mildly painful thickened and elongated toenails and dry skin; unable to trim. Patient is limited in speech and does not have legal guardian present with him today. No other issues noted.  Patient Active Problem List   Diagnosis Date Noted  . Pain and swelling of left lower leg 06/02/2019  . Traumatic arthritis of left knee 06/02/2019  . Myalgia 05/10/2019  . Right otitis media 04/17/2019  . History of DVT (deep vein thrombosis) 02/13/2019  . Pre-ulcerative corn or callous 02/09/2019  . Lump in throat 02/09/2019  . Cough 01/06/2019  . Increased abdominal girth 09/29/2018  . Rash 09/29/2018  . Hematochezia 06/25/2018  . Urinary stream slowing 12/13/2017  . Plantar fasciitis, left 09/23/2017  . Conjunctivitis of left eye 06/11/2017  . Left ear hearing loss 06/11/2017  . Skin lesion 06/11/2017  . Hyperglycemia 06/11/2017  . Cellulitis 07/25/2016  . 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 Right leg pain 06/20/2016  . Left ankle pain 06/13/2016  . Acute sinus infection 04/12/2016  . RLS (restless legs syndrome) 04/12/2016  . Respiratory abnormality   . Obesity (BMI 30-39.9) 03/02/2015  . Acute on chronic diastolic CHF (congestive heart failure) (HCC)   . Autism   . DVT, lower extremity, distal, acute (HCC)   . Closed trimalleolar fracture of left ankle 02/26/2015  . Hyperlipidemia 02/19/2014  . Depression with anxiety 02/19/2014  . Acute respiratory failure with hypoxia (HCC) 02/19/2014  . Normocytic anemia 02/19/2014  . Community acquired pneumonia 02/18/2014  . Left foot pain 02/09/2013  . Urinary frequency 02/03/2012  . Bilateral hearing loss 02/03/2012  . Preventative health care 11/28/2010  . Obstructive sleep apnea 08/14/2009  . Essential hypertension 02/16/2007  . Allergic rhinitis 02/16/2007  . GERD 02/16/2007    Current Outpatient Medications on File Prior  to Visit  Medication Sig Dispense Refill  . albuterol (VENTOLIN HFA) 108 (90 Base) MCG/ACT inhaler Inhale 2 puffs into the lungs every 6 (six) hours as needed for wheezing or shortness of breath. 3 Inhaler 3  . apixaban (ELIQUIS) 5 MG TABS tablet Take 1 tablet (5 mg total) by mouth 2 (two) times daily. 180 tablet 3  . aspirin EC 81 MG tablet Take 81 mg by mouth daily.    Marland Kitchen atorvastatin (LIPITOR) 40 MG tablet Take 1 tablet (40 mg total) by mouth daily. 90 tablet 3  . buPROPion (WELLBUTRIN XL) 300 MG 24 hr tablet Take 1 tablet (300 mg total) by mouth daily. 90 tablet 3  . cholecalciferol (VITAMIN D3) 25 MCG (1000 UNIT) tablet Take 1,000 Units by mouth daily.    Marland Kitchen doxycycline (VIBRA-TABS) 100 MG tablet Take 1 tablet (100 mg total) by mouth 2 (two) times daily. 20 tablet 0  . fexofenadine (ALLEGRA) 180 MG tablet Take 1 tablet (180 mg total) by mouth daily. 30 tablet 2  . furosemide (LASIX) 40 MG tablet Take 1 tablet (40 mg total) by mouth daily. 90 tablet 3  . gabapentin (NEURONTIN) 300 MG capsule TAKE 2 CAPSULES (600 MG TOTAL) BY MOUTH AT BEDTIME. 180 capsule 1  . hydroxypropyl methylcellulose / hypromellose (ISOPTO TEARS / GONIOVISC) 2.5 % ophthalmic solution Place 1 drop into both eyes 3 (three) times daily as needed for dry eyes.    Marland Kitchen lisinopril (ZESTRIL) 20 MG tablet Take 1 tablet (20 mg total) by mouth daily. 90 tablet 3  . metoprolol tartrate (LOPRESSOR) 25 MG tablet  Take 0.5 tablets (12.5 mg total) by mouth 2 (two) times daily. 90 tablet 3  . nystatin (MYCOSTATIN/NYSTOP) powder Use as directed twice daily as needed for groin and scrotal rash 45 g 1  . omeprazole (PRILOSEC) 20 MG capsule Take 1 capsule (20 mg total) by mouth daily. 90 capsule 3  . sertraline (ZOLOFT) 100 MG tablet TAKE 2 TABLETS BY MOUTH EVERY DAY 180 tablet 3  . tamsulosin (FLOMAX) 0.4 MG CAPS capsule Take 1 capsule (0.4 mg total) by mouth 2 (two) times daily. 180 capsule 3  . triamcinolone (NASACORT) 55 MCG/ACT AERO nasal  inhaler Place 2 sprays into the nose daily. 1 Inhaler 12  . triamcinolone cream (KENALOG) 0.1 % Apply 1 application topically 2 (two) times daily. To legs for itching 453.6 g 0  . vitamin B-12 (CYANOCOBALAMIN) 1000 MCG tablet Take 1,000 mcg by mouth daily.     No current facility-administered medications on file prior to visit.    Allergies  Allergen Reactions  . Fluoxetine Hcl Other (See Comments)    ineffective  . Xanax [Alprazolam] Other (See Comments)    Made patient too sleepy    Objective: Physical Exam  General: Well developed, nourished, no acute distress, awake, alert and oriented x2  Vascular: Dorsalis pedis artery 1/4 bilateral, Posterior tibial artery 0/4 bilateral, skin temperature warm to warm proximal to distal bilateral lower extremities, trophic skin changes bilateral, no pedal hair present.  Neurological: Gross sensation present via light touch bilateral.   Dermatological: Skin is warm, dry, and supple bilateral, Nails 1-10 are tender, long, thick, and discolored with mild subungal debris, no webspace macerations present bilateral, no open lesions present bilateral, no callus/corns/hyperkeratotic tissue present bilateral.  Dry skin to plantar heels.  No signs of infection bilateral.  Musculoskeletal: Asymptomatic pes planus boney deformities noted bilateral. Muscular strength within normal limits without painon range of motion. No pain with calf compression bilateral.  Assessment and Plan:  Problem List Items Addressed This Visit   None   Visit Diagnoses    Pain due to onychomycosis of toenails of both feet    -  Primary   Dry skin       PVD (peripheral vascular disease) (HCC)          -Examined patient.  -Re-Discussed treatment options for painful mycotic nails. -Mechanically debrided and reduced mycotic nails with sterile nail nipper and dremel nail file without incident -Patient to return in 3 months for follow up evaluation or sooner if symptoms  worsen.  Asencion Islam, DPM

## 2020-03-07 ENCOUNTER — Ambulatory Visit (INDEPENDENT_AMBULATORY_CARE_PROVIDER_SITE_OTHER): Payer: Medicare Other | Admitting: Internal Medicine

## 2020-03-07 ENCOUNTER — Other Ambulatory Visit: Payer: Self-pay

## 2020-03-07 VITALS — BP 178/88 | HR 98 | Temp 98.0°F | Ht 70.0 in | Wt 333.0 lb

## 2020-03-07 DIAGNOSIS — R739 Hyperglycemia, unspecified: Secondary | ICD-10-CM

## 2020-03-07 DIAGNOSIS — I872 Venous insufficiency (chronic) (peripheral): Secondary | ICD-10-CM

## 2020-03-07 DIAGNOSIS — R350 Frequency of micturition: Secondary | ICD-10-CM | POA: Diagnosis not present

## 2020-03-07 DIAGNOSIS — L84 Corns and callosities: Secondary | ICD-10-CM

## 2020-03-07 DIAGNOSIS — E538 Deficiency of other specified B group vitamins: Secondary | ICD-10-CM

## 2020-03-07 DIAGNOSIS — Z23 Encounter for immunization: Secondary | ICD-10-CM

## 2020-03-07 DIAGNOSIS — E559 Vitamin D deficiency, unspecified: Secondary | ICD-10-CM

## 2020-03-07 DIAGNOSIS — N320 Bladder-neck obstruction: Secondary | ICD-10-CM

## 2020-03-07 DIAGNOSIS — E7849 Other hyperlipidemia: Secondary | ICD-10-CM

## 2020-03-07 DIAGNOSIS — L309 Dermatitis, unspecified: Secondary | ICD-10-CM

## 2020-03-07 DIAGNOSIS — R21 Rash and other nonspecific skin eruption: Secondary | ICD-10-CM

## 2020-03-07 MED ORDER — PREDNISONE 10 MG PO TABS: ORAL_TABLET | ORAL | 0 refills | Status: DC

## 2020-03-07 MED ORDER — METOPROLOL TARTRATE 25 MG PO TABS: 12.5000 mg | ORAL_TABLET | Freq: Two times a day (BID) | ORAL | 3 refills | Status: DC

## 2020-03-07 MED ORDER — LISINOPRIL 20 MG PO TABS: 20.0000 mg | ORAL_TABLET | Freq: Every day | ORAL | 3 refills | Status: DC

## 2020-03-07 MED ORDER — TRIAMCINOLONE ACETONIDE 0.1 % EX CREA: 1.0000 "application " | TOPICAL_CREAM | Freq: Two times a day (BID) | CUTANEOUS | 0 refills | Status: DC

## 2020-03-07 NOTE — Patient Instructions (Addendum)
You had the flu shot today  Please take all new medication as prescribed - the prednisone and cream for the excema  You will be contacted regarding the referral for: dermatology, and podiatry (foot doctor)  You are given the prescription for the compression stockings, to be taken to a medical supply store, such as [facility] on [address]  Please continue all other medications as before, and refills have been done if requested  - the blood pressure medications  Please have the pharmacy call with any other refills you may need.  Please continue your efforts at being more active, low cholesterol diet, and weight control.  You are otherwise up to date with prevention measures today.  Please keep your appointments with your specialists as you may have planned  Please go to the LAB at the blood drawing area for the tests to be done  You will be contacted by phone if any changes need to be made immediately.  Otherwise, you will receive a letter about your results with an explanation, but please check with MyChart first.  Please remember to sign up for MyChart if you have not done so, as this will be important to you in the future with finding out test results, communicating by private email, and scheduling acute appointments online when needed.  Please make an Appointment to return in 6 months, or sooner if needed, also with Lab Appointment for testing done 3-5 days before at the FIRST FLOOR Lab (so this is for TWO appointments - please see the scheduling desk as you leave)  Due to the ongoing Covid 19 pandemic, our lab now requires an appointment for any labs done at our office.  If you need labs done and do not have an appointment, please call our office ahead of time to schedule before presenting to the lab for your testing.

## 2020-03-07 NOTE — Progress Notes (Signed)
Patient ID: Phillip Ohm., male   DOB: 1960/03/17, 60 y.o.   MRN: [id number]        Chief Complaint: eczema rash flare, persistent leg swelling, callous to both heals, and urinary frequency       HPI:  Phillip Heslin. is a 60 y.o. male here with c/o 2-3 wks onset worsening itchy eczema rash to the arms, just not improved with OTC steroid creams and benadryl.  Pt denies chest pain, increased sob or doe, wheezing, orthopnea, PND, increased LE swelling, palpitations, dizziness or syncope, but has persistent leg swelling better in the AM, but worsening in the PM.  Also has bilateral heel pain and callouses worsening in the past month it seems.  Also c/o urinary frequency mild to mod for 3 days, but Denies urinary symptoms such as dysuria, urgency, flank pain, hematuria or n/v, fever, chills.  Wt Readings from Last 3 Encounters:  03/07/20 (!) 333 lb (151 kg)  07/06/19 (!) 331 lb (150.1 kg)  06/15/19 (!) 342 lb (155.1 kg)   BP Readings from Last 3 Encounters:  03/07/20 (!) 178/88  01/25/20 (!) 169/86  12/24/19 (!) 179/99         Past Medical History:  Diagnosis Date  . Allergic rhinitis   . Anemia of chronic disease   . Anxiety   . Asthma   . Autism   . 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 Depression   . Gallstone   . GERD (gastroesophageal reflux disease)   . Hyperlipidemia   . Hypertension   . IBS (irritable bowel syndrome)   . Obesity   . OSA (obstructive sleep apnea)   . Pneumonia   . UTI (urinary tract infection)    Past Surgical History:  Procedure Laterality Date  . ORIF ANKLE FRACTURE Left 03/02/2015   Procedure: OPEN REDUCTION INTERNAL FIXATION (ORIF) LEFT ANKLE TRIMALLEOLAR FRACTURE;  Surgeon: Toni Arthurs, MD;  Location: [facility] OR;  Service: Orthopedics;  Laterality: Left;    reports that he has never smoked. He has never used smokeless tobacco. He reports that he does not drink alcohol and does not use drugs. family history includes Alcohol abuse in an other family  member; Arthritis in an other family member; Breast cancer in an other family member; Diabetes in an other family member; Heart disease in his father and another family member; Hyperlipidemia in an other family member; Irritable bowel syndrome in his mother; Kidney disease in an other family member; Pancreatic cancer in an other family member; Prostate cancer in an other family member; Stroke in an other family member. Allergies  Allergen Reactions  . Fluoxetine Hcl Other (See Comments)    ineffective  . Xanax [Alprazolam] Other (See Comments)    Made patient too sleepy   Current Outpatient Medications on File Prior to Visit  Medication Sig Dispense Refill  . albuterol (VENTOLIN HFA) 108 (90 Base) MCG/ACT inhaler Inhale 2 puffs into the lungs every 6 (six) hours as needed for wheezing or shortness of breath. 3 Inhaler 3  . apixaban (ELIQUIS) 5 MG TABS tablet Take 1 tablet (5 mg total) by mouth 2 (two) times daily. 180 tablet 3  . aspirin EC 81 MG tablet Take 81 mg by mouth daily.    Marland Kitchen atorvastatin (LIPITOR) 40 MG tablet Take 1 tablet (40 mg total) by mouth daily. 90 tablet 3  . buPROPion (WELLBUTRIN XL) 300 MG 24 hr tablet Take 1 tablet (300 mg total) by mouth daily. 90 tablet 3  .  cholecalciferol (VITAMIN D3) 25 MCG (1000 UNIT) tablet Take 1,000 Units by mouth daily.    Marland Kitchen doxycycline (VIBRA-TABS) 100 MG tablet Take 1 tablet (100 mg total) by mouth 2 (two) times daily. 20 tablet 0  . fexofenadine (ALLEGRA) 180 MG tablet Take 1 tablet (180 mg total) by mouth daily. 30 tablet 2  . furosemide (LASIX) 40 MG tablet Take 1 tablet (40 mg total) by mouth daily. 90 tablet 3  . gabapentin (NEURONTIN) 300 MG capsule TAKE 2 CAPSULES (600 MG TOTAL) BY MOUTH AT BEDTIME. 180 capsule 1  . hydroxypropyl methylcellulose / hypromellose (ISOPTO TEARS / GONIOVISC) 2.5 % ophthalmic solution Place 1 drop into both eyes 3 (three) times daily as needed for dry eyes.    Marland Kitchen nystatin (MYCOSTATIN/NYSTOP) powder Use as  directed twice daily as needed for groin and scrotal rash 45 g 1  . omeprazole (PRILOSEC) 20 MG capsule Take 1 capsule (20 mg total) by mouth daily. 90 capsule 3  . sertraline (ZOLOFT) 100 MG tablet TAKE 2 TABLETS BY MOUTH EVERY DAY 180 tablet 3  . tamsulosin (FLOMAX) 0.4 MG CAPS capsule Take 1 capsule (0.4 mg total) by mouth 2 (two) times daily. 180 capsule 3  . triamcinolone (NASACORT) 55 MCG/ACT AERO nasal inhaler Place 2 sprays into the nose daily. 1 Inhaler 12  . vitamin B-12 (CYANOCOBALAMIN) 1000 MCG tablet Take 1,000 mcg by mouth daily.     No current facility-administered medications on file prior to visit.        ROS:  All others reviewed and negative.  Objective        PE:  BP (!) 178/88   Pulse 98   Temp 98 F (36.7 C) (Oral)   Ht 5\' 10"  (1.778 m)   Wt (!) 333 lb (151 kg)   SpO2 92%   BMI 47.78 kg/m                 Constitutional: Pt appears in NAD               HENT: Head: NCAT.                Right Ear: External ear normal.                 Left Ear: External ear normal.                Eyes: . Pupils are equal, round, and reactive to light. Conjunctivae and EOM are normal               Nose: without d/c or deformity               Neck: Neck supple. Gross normal ROM               Cardiovascular: Normal rate and regular rhythm.                 Pulmonary/Chest: Effort normal and breath sounds without rales or wheezing.                Abd:  Soft, NT, ND, + BS, no organomegaly               Neurological: Pt is alert. At baseline orientation, motor grossly intact               Skin: Skin is warm.marked eczematous change to arms, no other new lesions, LE edema - trace to 1+ bilateral, also has tender bllateral heel callous  Psychiatric: Pt behavior is normal without agitation   Micro: none  Cardiac tracings I have personally interpreted today:  none  Pertinent Radiological findings (summarize): none   Lab Results  Component Value Date   WBC 6.2  05/10/2019   HGB 11.7 (L) 05/10/2019   HCT 35.7 (L) 05/10/2019   PLT 251.0 05/10/2019   GLUCOSE 76 05/10/2019   CHOL 164 05/10/2019   TRIG 204.0 (H) 05/10/2019   HDL 50.20 05/10/2019   LDLDIRECT 77.0 05/10/2019   LDLCALC 91 06/25/2018   ALT 28 05/10/2019   AST 23 05/10/2019   NA 140 05/10/2019   K 4.6 05/10/2019   CL 100 05/10/2019   CREATININE 1.14 05/10/2019   BUN 25 (H) 05/10/2019   CO2 34 (H) 05/10/2019   TSH 1.35 05/10/2019   PSA 0.33 05/10/2019   INR 1.05 03/02/2015   HGBA1C 6.7 (H) 05/10/2019   Assessment/Plan:  Phillip Binette. is a 60 y.o. White or Caucasian [1] male with  has a past medical history of Allergic rhinitis, Anemia of chronic disease, Anxiety, Asthma, Autism, Bladder neck obstruction (05/29/2011), Depression, Gallstone, GERD (gastroesophageal reflux disease), Hyperlipidemia, Hypertension, IBS (irritable bowel syndrome), Obesity, OSA (obstructive sleep apnea), Pneumonia, and UTI (urinary tract infection).  Pre-ulcerative corn or callous No ulcer or infection, but will need podiatry referral,  to f/u any worsening symptoms or concerns  Hyperglycemia Lab Results  Component Value Date   HGBA1C 6.7 (H) 05/10/2019   Stable, pt to continue current medical treatment - diet  Hyperlipidemia Lab Results  Component Value Date   LDLCALC 91 06/25/2018   Stable, pt to continue current statin  - lipitor 40  Current Outpatient Medications (Endocrine & Metabolic):  .  predniSONE (DELTASONE) 10 MG tablet, 3 tabs by mouth per day for 3 days,2tabs per day for 3 days,1tab per day for 3 days  Current Outpatient Medications (Cardiovascular):  .  atorvastatin (LIPITOR) 40 MG tablet, Take 1 tablet (40 mg total) by mouth daily. .  furosemide (LASIX) 40 MG tablet, Take 1 tablet (40 mg total) by mouth daily. Marland Kitchen  lisinopril (ZESTRIL) 20 MG tablet, Take 1 tablet (20 mg total) by mouth daily. .  metoprolol tartrate (LOPRESSOR) 25 MG tablet, Take 0.5 tablets (12.5 mg total) by  mouth 2 (two) times daily.  Current Outpatient Medications (Respiratory):  .  albuterol (VENTOLIN HFA) 108 (90 Base) MCG/ACT inhaler, Inhale 2 puffs into the lungs every 6 (six) hours as needed for wheezing or shortness of breath. .  fexofenadine (ALLEGRA) 180 MG tablet, Take 1 tablet (180 mg total) by mouth daily. Marland Kitchen  triamcinolone (NASACORT) 55 MCG/ACT AERO nasal inhaler, Place 2 sprays into the nose daily.  Current Outpatient Medications (Analgesics):  .  aspirin EC 81 MG tablet, Take 81 mg by mouth daily.  Current Outpatient Medications (Hematological):  .  apixaban (ELIQUIS) 5 MG TABS tablet, Take 1 tablet (5 mg total) by mouth 2 (two) times daily. .  vitamin B-12 (CYANOCOBALAMIN) 1000 MCG tablet, Take 1,000 mcg by mouth daily.  Current Outpatient Medications (Other):  Marland Kitchen  buPROPion (WELLBUTRIN XL) 300 MG 24 hr tablet, Take 1 tablet (300 mg total) by mouth daily. .  cholecalciferol (VITAMIN D3) 25 MCG (1000 UNIT) tablet, Take 1,000 Units by mouth daily. Marland Kitchen  doxycycline (VIBRA-TABS) 100 MG tablet, Take 1 tablet (100 mg total) by mouth 2 (two) times daily. Marland Kitchen  gabapentin (NEURONTIN) 300 MG capsule, TAKE 2 CAPSULES (600 MG TOTAL) BY MOUTH AT BEDTIME. .  hydroxypropyl methylcellulose /  hypromellose (ISOPTO TEARS / GONIOVISC) 2.5 % ophthalmic solution, Place 1 drop into both eyes 3 (three) times daily as needed for dry eyes. Marland Kitchen.  nystatin (MYCOSTATIN/NYSTOP) powder, Use as directed twice daily as needed for groin and scrotal rash .  omeprazole (PRILOSEC) 20 MG capsule, Take 1 capsule (20 mg total) by mouth daily. .  sertraline (ZOLOFT) 100 MG tablet, TAKE 2 TABLETS BY MOUTH EVERY DAY .  tamsulosin (FLOMAX) 0.4 MG CAPS capsule, Take 1 capsule (0.4 mg total) by mouth 2 (two) times daily. Marland Kitchen.  triamcinolone (KENALOG) 0.1 %, Apply 1 application topically 2 (two) times daily. To legs for itching   Urinary frequency Etiology unclear, exam benign but for urine studies and culture  Rash C/w eczema  flare, for predpac and topical steroid cream,  to f/u any worsening symptoms or concerns; for dermatology referral  Venous insufficiency Ok for compression stockings,  to f/u any worsening symptoms or concerns  Followup: Return in about 6 months (around 09/04/2020).  Oliver BarreJames Xiomara Sevillano, MD 03/12/2020 8:04 PM [facility]

## 2020-03-12 ENCOUNTER — Encounter: Payer: Self-pay | Admitting: Internal Medicine

## 2020-03-12 DIAGNOSIS — I872 Venous insufficiency (chronic) (peripheral): Secondary | ICD-10-CM | POA: Insufficient documentation

## 2020-03-12 NOTE — Assessment & Plan Note (Signed)
Etiology unclear, exam benign but for urine studies and culture

## 2020-03-12 NOTE — Assessment & Plan Note (Signed)
Lab Results  Component Value Date   HGBA1C 6.7 (H) 05/10/2019   Stable, pt to continue current medical treatment - diet

## 2020-03-12 NOTE — Assessment & Plan Note (Signed)
Lab Results  Component Value Date   LDLCALC 91 06/25/2018   Stable, pt to continue current statin  - lipitor 40  Current Outpatient Medications (Endocrine & Metabolic):  .  predniSONE (DELTASONE) 10 MG tablet, 3 tabs by mouth per day for 3 days,2tabs per day for 3 days,1tab per day for 3 days  Current Outpatient Medications (Cardiovascular):  .  atorvastatin (LIPITOR) 40 MG tablet, Take 1 tablet (40 mg total) by mouth daily. .  furosemide (LASIX) 40 MG tablet, Take 1 tablet (40 mg total) by mouth daily. Marland Kitchen  lisinopril (ZESTRIL) 20 MG tablet, Take 1 tablet (20 mg total) by mouth daily. .  metoprolol tartrate (LOPRESSOR) 25 MG tablet, Take 0.5 tablets (12.5 mg total) by mouth 2 (two) times daily.  Current Outpatient Medications (Respiratory):  .  albuterol (VENTOLIN HFA) 108 (90 Base) MCG/ACT inhaler, Inhale 2 puffs into the lungs every 6 (six) hours as needed for wheezing or shortness of breath. .  fexofenadine (ALLEGRA) 180 MG tablet, Take 1 tablet (180 mg total) by mouth daily. Marland Kitchen  triamcinolone (NASACORT) 55 MCG/ACT AERO nasal inhaler, Place 2 sprays into the nose daily.  Current Outpatient Medications (Analgesics):  .  aspirin EC 81 MG tablet, Take 81 mg by mouth daily.  Current Outpatient Medications (Hematological):  .  apixaban (ELIQUIS) 5 MG TABS tablet, Take 1 tablet (5 mg total) by mouth 2 (two) times daily. .  vitamin B-12 (CYANOCOBALAMIN) 1000 MCG tablet, Take 1,000 mcg by mouth daily.  Current Outpatient Medications (Other):  Marland Kitchen  buPROPion (WELLBUTRIN XL) 300 MG 24 hr tablet, Take 1 tablet (300 mg total) by mouth daily. .  cholecalciferol (VITAMIN D3) 25 MCG (1000 UNIT) tablet, Take 1,000 Units by mouth daily. Marland Kitchen  doxycycline (VIBRA-TABS) 100 MG tablet, Take 1 tablet (100 mg total) by mouth 2 (two) times daily. Marland Kitchen  gabapentin (NEURONTIN) 300 MG capsule, TAKE 2 CAPSULES (600 MG TOTAL) BY MOUTH AT BEDTIME. .  hydroxypropyl methylcellulose / hypromellose (ISOPTO TEARS / GONIOVISC)  2.5 % ophthalmic solution, Place 1 drop into both eyes 3 (three) times daily as needed for dry eyes. Marland Kitchen  nystatin (MYCOSTATIN/NYSTOP) powder, Use as directed twice daily as needed for groin and scrotal rash .  omeprazole (PRILOSEC) 20 MG capsule, Take 1 capsule (20 mg total) by mouth daily. .  sertraline (ZOLOFT) 100 MG tablet, TAKE 2 TABLETS BY MOUTH EVERY DAY .  tamsulosin (FLOMAX) 0.4 MG CAPS capsule, Take 1 capsule (0.4 mg total) by mouth 2 (two) times daily. Marland Kitchen  triamcinolone (KENALOG) 0.1 %, Apply 1 application topically 2 (two) times daily. To legs for itching

## 2020-03-12 NOTE — Assessment & Plan Note (Addendum)
C/w eczema flare, for predpac and topical steroid cream,  to f/u any worsening symptoms or concerns; for dermatology referral

## 2020-03-12 NOTE — Assessment & Plan Note (Signed)
Ok for compression stockings,  to f/u any worsening symptoms or concerns

## 2020-03-12 NOTE — Assessment & Plan Note (Signed)
No ulcer or infection, but will need podiatry referral,  to f/u any worsening symptoms or concerns

## 2020-03-30 ENCOUNTER — Encounter: Payer: Self-pay | Admitting: Sports Medicine

## 2020-03-30 ENCOUNTER — Telehealth: Payer: Self-pay | Admitting: Sports Medicine

## 2020-03-30 NOTE — Telephone Encounter (Signed)
Called pt to rescheduled 06/01/20 appt to 5/19 unable to leave a vm.

## 2020-05-27 ENCOUNTER — Other Ambulatory Visit: Payer: Self-pay | Admitting: Internal Medicine

## 2020-05-27 NOTE — Telephone Encounter (Signed)
Please refill as per office routine med refill policy (all routine meds refilled for 3 mo or monthly per pt preference up to one year from last visit, then month to month grace period for 3 mo, then further med refills will have to be denied)  

## 2020-05-31 ENCOUNTER — Telehealth: Payer: Self-pay

## 2020-06-01 ENCOUNTER — Ambulatory Visit: Payer: Medicare Other | Admitting: Sports Medicine

## 2020-06-01 ENCOUNTER — Telehealth: Payer: Self-pay | Admitting: Internal Medicine

## 2020-06-01 NOTE — Telephone Encounter (Signed)
If possible, check the oxygen saturation on regular air (room air) at the home; if less than 90 percent he needs to go NOW to ED  Also, if he cannot check the oxygen saturation in the home, and pt is SOB - pt should go to ED NOW (to call EMS if needed)  If able to check the oxygen saturation (o2 sat) and it is > 90, and he does not feel sob, we need to know the name of his oxygen company (or home health company), how much oxygen he is normally on (such 2L or 3L, and whether all the time or just at night)

## 2020-06-01 NOTE — Telephone Encounter (Signed)
Patients brother calling, states the patients oxygen machine stopped working and the number on the machine does not work, wondering what they need to do.

## 2020-06-02 NOTE — Telephone Encounter (Signed)
Notified both Regan Rakers and the patient with Dr. Jonny Ruiz last note. Both parties stated the machine was not working due to blockage from knots and stuck under the recliner.

## 2020-06-07 ENCOUNTER — Other Ambulatory Visit: Payer: Self-pay

## 2020-06-08 ENCOUNTER — Encounter: Payer: Self-pay | Admitting: Internal Medicine

## 2020-06-08 ENCOUNTER — Ambulatory Visit (INDEPENDENT_AMBULATORY_CARE_PROVIDER_SITE_OTHER): Payer: Medicare Other | Admitting: Internal Medicine

## 2020-06-08 VITALS — BP 148/72 | HR 57 | Temp 98.1°F | Ht 70.0 in | Wt 328.0 lb

## 2020-06-08 DIAGNOSIS — R739 Hyperglycemia, unspecified: Secondary | ICD-10-CM

## 2020-06-08 DIAGNOSIS — G5601 Carpal tunnel syndrome, right upper limb: Secondary | ICD-10-CM

## 2020-06-08 DIAGNOSIS — J309 Allergic rhinitis, unspecified: Secondary | ICD-10-CM | POA: Diagnosis not present

## 2020-06-08 DIAGNOSIS — L84 Corns and callosities: Secondary | ICD-10-CM

## 2020-06-08 DIAGNOSIS — E7800 Pure hypercholesterolemia, unspecified: Secondary | ICD-10-CM

## 2020-06-08 DIAGNOSIS — I10 Essential (primary) hypertension: Secondary | ICD-10-CM

## 2020-06-08 MED ORDER — TRAZODONE HCL 50 MG PO TABS: 50.0000 mg | ORAL_TABLET | Freq: Every evening | ORAL | 1 refills | Status: DC | PRN

## 2020-06-08 MED ORDER — PREDNISONE 10 MG PO TABS: ORAL_TABLET | ORAL | 0 refills | Status: AC

## 2020-06-08 MED ORDER — METHYLPREDNISOLONE ACETATE 80 MG/ML IJ SUSP
80.0000 mg | Freq: Once | INTRAMUSCULAR | Status: AC
  Administered 2020-06-08: 80 mg via INTRAMUSCULAR

## 2020-06-08 NOTE — Patient Instructions (Signed)
Your left ear was irrigated of wax today  You had the steroid shot today  Please take all new medication as prescribed - the trazodone for sleep as needed, and prednisone for the allergies and eczema  Please continue all other medications as before, and refills have been done if requested.  Please have the pharmacy call with any other refills you may need.  Please continue your efforts at being more active, low cholesterol diet, and weight control.  You are otherwise up to date with prevention measures today.  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  Otherwise, you will receive a letter about your results with an explanation, but please check with MyChart first.  Please remember to sign up for MyChart if you have not done so, as this will be important to you in the future with finding out test results, communicating by private email, and scheduling acute appointments online when needed.  Please make an Appointment to return in 6 months, or sooner if needed

## 2020-06-08 NOTE — Progress Notes (Signed)
Patient ID: Phillip Ohm., male   DOB: 1960/08/26, 60 y.o.   MRN: [id number]        Chief Complaint: foot callous, right cts, insomnia, left hearing loss, and allergies       HPI:  Phillip Gappa. is a 60 y.o. male here with numbness to right hand and arm mild intermittent for several weeks, without pain or weakness but persistent and pt requests referral.  Also has worsening foot callous bilateral, needs attention as starting to hurt as well.  Also has persistent insomnia with getting and staying asleep ongoing for many months, just worse recently as well.  Denies worsening depressive symptoms, suicidal ideation, or panic.  Also has left hearing loss acute on chronic mild intermittent without pain or HA or fever  -? Wax.  Also, Does have several wks ongoing nasal allergy symptoms with clearish congestion, itch and sneezing, without fever, pain, ST, cough, swelling or wheezing.  Pt denies chest pain, increased sob or doe, wheezing, orthopnea, PND, increased LE swelling, palpitations, dizziness or syncope.   Pt denies polydipsia, polyuria, or new focal neuro s/s.   Wt Readings from Last 3 Encounters:  06/08/20 (!) 328 lb (148.8 kg)  03/07/20 (!) 333 lb (151 kg)  07/06/19 (!) 331 lb (150.1 kg)   BP Readings from Last 3 Encounters:  06/08/20 (!) 148/72  03/07/20 (!) 178/88  01/25/20 (!) 169/86         Past Medical History:  Diagnosis Date  . Allergic rhinitis   . Anemia of chronic disease   . Anxiety   . Asthma   . Autism   . 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 Depression   . Gallstone   . GERD (gastroesophageal reflux disease)   . Hyperlipidemia   . Hypertension   . IBS (irritable bowel syndrome)   . Obesity   . OSA (obstructive sleep apnea)   . Pneumonia   . UTI (urinary tract infection)    Past Surgical History:  Procedure Laterality Date  . ORIF ANKLE FRACTURE Left 03/02/2015   Procedure: OPEN REDUCTION INTERNAL FIXATION (ORIF) LEFT ANKLE TRIMALLEOLAR FRACTURE;   Surgeon: Toni Arthurs, MD;  Location: [facility] OR;  Service: Orthopedics;  Laterality: Left;    reports that he has never smoked. He has never used smokeless tobacco. He reports that he does not drink alcohol and does not use drugs. family history includes Alcohol abuse in an other family member; Arthritis in an other family member; Breast cancer in an other family member; Diabetes in an other family member; Heart disease in his father and another family member; Hyperlipidemia in an other family member; Irritable bowel syndrome in his mother; Kidney disease in an other family member; Pancreatic cancer in an other family member; Prostate cancer in an other family member; Stroke in an other family member. Allergies  Allergen Reactions  . Fluoxetine Hcl Other (See Comments)    ineffective  . Xanax [Alprazolam] Other (See Comments)    Made patient too sleepy   Current Outpatient Medications on File Prior to Visit  Medication Sig Dispense Refill  . albuterol (VENTOLIN HFA) 108 (90 Base) MCG/ACT inhaler Inhale 2 puffs into the lungs every 6 (six) hours as needed for wheezing or shortness of breath. 3 Inhaler 3  . apixaban (ELIQUIS) 5 MG TABS tablet Take 1 tablet (5 mg total) by mouth 2 (two) times daily. 180 tablet 3  . aspirin EC 81 MG tablet Take 81 mg by mouth daily.    Marland Kitchen  atorvastatin (LIPITOR) 40 MG tablet Take 1 tablet (40 mg total) by mouth daily. 90 tablet 3  . buPROPion (WELLBUTRIN XL) 300 MG 24 hr tablet Take 1 tablet (300 mg total) by mouth daily. 90 tablet 3  . cholecalciferol (VITAMIN D3) 25 MCG (1000 UNIT) tablet Take 1,000 Units by mouth daily.    Marland Kitchen gabapentin (NEURONTIN) 300 MG capsule TAKE 2 CAPSULES (600 MG TOTAL) BY MOUTH AT BEDTIME. 180 capsule 1  . hydroxypropyl methylcellulose / hypromellose (ISOPTO TEARS / GONIOVISC) 2.5 % ophthalmic solution Place 1 drop into both eyes 3 (three) times daily as needed for dry eyes.    Marland Kitchen lisinopril (ZESTRIL) 20 MG tablet Take 1 tablet (20 mg total) by  mouth daily. 90 tablet 3  . nystatin (MYCOSTATIN/NYSTOP) powder Use as directed twice daily as needed for groin and scrotal rash 45 g 1  . omeprazole (PRILOSEC) 20 MG capsule TAKE 1 CAPSULE BY MOUTH EVERY DAY 90 capsule 3  . sertraline (ZOLOFT) 100 MG tablet TAKE 2 TABLETS BY MOUTH EVERY DAY 180 tablet 3  . tamsulosin (FLOMAX) 0.4 MG CAPS capsule Take 1 capsule (0.4 mg total) by mouth 2 (two) times daily. 180 capsule 3  . triamcinolone (KENALOG) 0.1 % Apply 1 application topically 2 (two) times daily. To legs for itching 453.6 g 0  . triamcinolone (NASACORT) 55 MCG/ACT AERO nasal inhaler Place 2 sprays into the nose daily. 1 Inhaler 12  . vitamin B-12 (CYANOCOBALAMIN) 1000 MCG tablet Take 1,000 mcg by mouth daily.    . fexofenadine (ALLEGRA) 180 MG tablet Take 1 tablet (180 mg total) by mouth daily. 30 tablet 2  . furosemide (LASIX) 40 MG tablet Take 1 tablet (40 mg total) by mouth daily. 90 tablet 3  . metoprolol tartrate (LOPRESSOR) 25 MG tablet Take 0.5 tablets (12.5 mg total) by mouth 2 (two) times daily. 180 tablet 3   No current facility-administered medications on file prior to visit.        ROS:  All others reviewed and negative.  Objective        PE:  BP (!) 148/72 (BP Location: Right Arm, Patient Position: Sitting, Cuff Size: Large)   Pulse (!) 57   Temp 98.1 F (36.7 C) (Oral)   Ht 5\' 10"  (1.778 m)   Wt (!) 328 lb (148.8 kg)   SpO2 98%   BMI 47.06 kg/m                 Constitutional: Pt appears in NAD               HENT: Head: NCAT.                Right Ear: External ear normal.                 Left Ear: External ear normal. Left wax impaction resolved with irrigation; Bilat tm's with mild erythema.  Max sinus areas none tender.  Pharynx with mild erythema, no exudate               Eyes: . Pupils are equal, round, and reactive to light. Conjunctivae and EOM are normal               Nose: without d/c or deformity               Neck: Neck supple. Gross normal ROM                Cardiovascular: Normal rate and regular rhythm.  Pulmonary/Chest: Effort normal and breath sounds without rales or wheezing.                Abd:  Soft, NT, ND, + BS, no organomegaly               Neurological: Pt is alert. At baseline orientation, motor grossly intact               Skin: has marked bilat foot callous LE edema - trace to 1+ chronic bilat               Psychiatric: Pt behavior is normal without agitation   Micro: none  Cardiac tracings I have personally interpreted today:  none  Pertinent Radiological findings (summarize): none   Lab Results  Component Value Date   WBC 6.2 05/10/2019   HGB 11.7 (L) 05/10/2019   HCT 35.7 (L) 05/10/2019   PLT 251.0 05/10/2019   GLUCOSE 76 05/10/2019   CHOL 164 05/10/2019   TRIG 204.0 (H) 05/10/2019   HDL 50.20 05/10/2019   LDLDIRECT 77.0 05/10/2019   LDLCALC 91 06/25/2018   ALT 28 05/10/2019   AST 23 05/10/2019   NA 140 05/10/2019   K 4.6 05/10/2019   CL 100 05/10/2019   CREATININE 1.14 05/10/2019   BUN 25 (H) 05/10/2019   CO2 34 (H) 05/10/2019   TSH 1.35 05/10/2019   PSA 0.33 05/10/2019   INR 1.05 03/02/2015   HGBA1C 6.7 (H) 05/10/2019   Assessment/Plan:  Phillip Amick. is a 60 y.o. White or Caucasian [1] male with  has a past medical history of Allergic rhinitis, Anemia of chronic disease, Anxiety, Asthma, Autism, Bladder neck obstruction (05/29/2011), Depression, Gallstone, GERD (gastroesophageal reflux disease), Hyperlipidemia, Hypertension, IBS (irritable bowel syndrome), Obesity, OSA (obstructive sleep apnea), Pneumonia, and UTI (urinary tract infection).  Hyperlipidemia Lab Results  Component Value Date   LDLCALC 91 06/25/2018   Stable, pt to continue current statin lipitor 40   Hyperglycemia Lab Results  Component Value Date   HGBA1C 6.7 (H) 05/10/2019   Stable, pt to continue current medical treatment  - diet   Essential hypertension BP Readings from Last 3 Encounters:  06/08/20  (!) 148/72  03/07/20 (!) 178/88  01/25/20 (!) 169/86   Stable, pt to continue medical treatment as is, I suspect some element of non compliance   Allergic rhinitis Mild to mod seasonal flare - for depomedrol im 80, predpac asd,  to f/u any worsening symptoms or concerns  Pre-ulcerative calluses For podiatry referral  Right carpal tunnel syndrome Also for hand surgury referral, and qhs wrist splint  Followup: Return in about 6 months (around 12/09/2020).  Oliver Barre, MD 06/11/2020 3:04 PM [facility]

## 2020-06-11 ENCOUNTER — Encounter: Payer: Self-pay | Admitting: Internal Medicine

## 2020-06-11 DIAGNOSIS — L84 Corns and callosities: Secondary | ICD-10-CM | POA: Insufficient documentation

## 2020-06-11 DIAGNOSIS — G5601 Carpal tunnel syndrome, right upper limb: Secondary | ICD-10-CM | POA: Insufficient documentation

## 2020-06-11 NOTE — Assessment & Plan Note (Signed)
BP Readings from Last 3 Encounters:  06/08/20 (!) 148/72  03/07/20 (!) 178/88  01/25/20 (!) 169/86   Stable, pt to continue medical treatment as is, I suspect some element of non compliance

## 2020-06-11 NOTE — Assessment & Plan Note (Signed)
Mild to mod seasonal flare - for depomedrol im 80, predpac asd,  to f/u any worsening symptoms or concerns

## 2020-06-11 NOTE — Assessment & Plan Note (Signed)
For podiatry referral 

## 2020-06-11 NOTE — Assessment & Plan Note (Signed)
Lab Results  Component Value Date   LDLCALC 91 06/25/2018   Stable, pt to continue current statin lipitor 40

## 2020-06-11 NOTE — Assessment & Plan Note (Signed)
Also for hand surgury referral, and qhs wrist splint

## 2020-06-11 NOTE — Assessment & Plan Note (Signed)
Lab Results  Component Value Date   HGBA1C 6.7 (H) 05/10/2019   Stable, pt to continue current medical treatment - diet 

## 2020-06-22 ENCOUNTER — Other Ambulatory Visit: Payer: Self-pay

## 2020-06-22 ENCOUNTER — Encounter: Payer: Self-pay | Admitting: Podiatry

## 2020-06-22 ENCOUNTER — Ambulatory Visit (INDEPENDENT_AMBULATORY_CARE_PROVIDER_SITE_OTHER): Payer: Medicare Other | Admitting: Podiatry

## 2020-06-22 DIAGNOSIS — B351 Tinea unguium: Secondary | ICD-10-CM | POA: Diagnosis not present

## 2020-06-22 DIAGNOSIS — M79674 Pain in right toe(s): Secondary | ICD-10-CM

## 2020-06-22 DIAGNOSIS — M79675 Pain in left toe(s): Secondary | ICD-10-CM

## 2020-06-22 NOTE — Progress Notes (Signed)
Subjective:   Patient ID: Phillip Ohm., male   DOB: 60 y.o.   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

## 2020-07-05 ENCOUNTER — Ambulatory Visit (INDEPENDENT_AMBULATORY_CARE_PROVIDER_SITE_OTHER): Payer: Medicare Other | Admitting: Orthopaedic Surgery

## 2020-07-05 ENCOUNTER — Encounter: Payer: Self-pay | Admitting: Orthopaedic Surgery

## 2020-07-05 ENCOUNTER — Other Ambulatory Visit: Payer: Self-pay

## 2020-07-05 DIAGNOSIS — R200 Anesthesia of skin: Secondary | ICD-10-CM | POA: Diagnosis not present

## 2020-07-05 DIAGNOSIS — R202 Paresthesia of skin: Secondary | ICD-10-CM

## 2020-07-05 NOTE — Progress Notes (Signed)
Office Visit Note   Patient: Phillip Butler.           Date of Birth: September 08, 1960           MRN: [id number] Visit Date: 07/05/2020              Requested by: Corwin Levins, MD [address] [place],  [place] [address] PCP: Corwin Levins, MD   Assessment & Plan: Visit Diagnoses:  1. Numbness and tingling of right hand     Plan:   At this point I do feel that EMG/nerve conduction studies are warranted of the right upper extremity to rule out a compressive neuropathy at the cubital tunnel versus a carpal tunnel.  We will see about setting him up for the studies and see him back accordingly in follow-up.  All questions and concerns were answered and addressed.  Follow-Up Instructions: No follow-ups on file. will follow-up after nerve conduction studies.  Orders:  No orders of the defined types were placed in this encounter.  No orders of the defined types were placed in this encounter.     Procedures: No procedures performed   Clinical Data: No additional findings.   Subjective: Chief Complaint  Patient presents with   Right Hand - Pain  The patient is a 60 year old gentleman I am seeing for the first time.  This is for right hand numbness and tingling.  He said this been going on for over a month and getting slowly worse.  He denies being a diabetic.  He has never had upper extremity surgery he states.  He does have restless leg syndrome at night which he takes Neurontin for.  He cannot take anti-inflammatories since she is on Eliquis.  I did review his medical chart.  He has never had any type of nerve conduction studies.  He does point to his little finger and ring finger as a source of his numbness and tingling on the right side.  HPI  Review of Systems Today he denies any headache, chest pain, shortness of breath, fever, chills, nausea, vomiting  Objective: Vital Signs: There were no vitals taken for this visit.  Physical Exam He is alert and orient x3 and  in no acute distress Ortho Exam Examination of his right hand shows no muscle atrophy.  His hand is well-perfused.  He has palpable pulses in his wrist.  He reports subjective decrease sensation over the fifth finger.  He does seem to have irritation of the ulnar nerve at the cubital tunnel when I perform a Tinel's exam on this area as opposed to the transverse carpal ligament.  He does state that the numbness does wake him up at night and again seems to be getting worse. Specialty Comments:  No specialty comments available.  Imaging: No results found.   PMFS History: Patient Active Problem List   Diagnosis Date Noted   Pre-ulcerative calluses 06/11/2020   Right carpal tunnel syndrome 06/11/2020   Venous insufficiency 03/12/2020   Pain and swelling of left lower leg 06/02/2019   Traumatic arthritis of left knee 06/02/2019   Myalgia 05/10/2019   Right otitis media 04/17/2019   History of DVT (deep vein thrombosis) 02/13/2019   Pre-ulcerative corn or callous 02/09/2019   Lump in throat 02/09/2019   Cough 01/06/2019   Increased abdominal girth 09/29/2018   Rash 09/29/2018   Hematochezia 06/25/2018   Urinary stream slowing 12/13/2017   Plantar fasciitis, left 09/23/2017   Conjunctivitis of left eye  06/11/2017   Left ear hearing loss 06/11/2017   Skin lesion 06/11/2017   Hyperglycemia 06/11/2017   Cellulitis 07/25/2016   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/11/2016   Right leg pain 06/20/2016   Left ankle pain 06/13/2016   Acute sinus infection 04/12/2016   RLS (restless legs syndrome) 04/12/2016   Respiratory abnormality    Obesity (BMI 30-39.9) 03/02/2015   Acute on chronic diastolic CHF (congestive heart failure) (HCC)    Autism    DVT, lower extremity, distal, acute (HCC)    Closed trimalleolar fracture of left ankle 02/26/2015   Hyperlipidemia 02/19/2014   Depression with anxiety 02/19/2014   Acute respiratory failure with hypoxia (HCC) 02/19/2014   Normocytic  anemia 02/19/2014   Community acquired pneumonia 02/18/2014   Left foot pain 02/09/2013   Urinary frequency 02/03/2012   Bilateral hearing loss 02/03/2012   Preventative health care 11/28/2010   Obstructive sleep apnea 08/14/2009   Essential hypertension 02/16/2007   Allergic rhinitis 02/16/2007   GERD 02/16/2007   Past Medical History:  Diagnosis Date   Allergic rhinitis    Anemia of chronic disease    Anxiety    Asthma    Autism    Bladder 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/29/2011   Depression    Gallstone    GERD (gastroesophageal reflux disease)    Hyperlipidemia    Hypertension    IBS (irritable bowel syndrome)    Obesity    OSA (obstructive sleep apnea)    Pneumonia    UTI (urinary tract infection)     Family History  Problem Relation Age of Onset   Irritable bowel syndrome Mother    Heart disease Father    Alcohol abuse Other    Arthritis Other    Breast cancer Other    Prostate cancer Other    Hyperlipidemia Other    Heart disease Other    Stroke Other    Kidney disease Other    Diabetes Other    Pancreatic cancer Other    Colon cancer Neg Hx    Esophageal cancer Neg Hx     Past Surgical History:  Procedure Laterality Date   ORIF ANKLE FRACTURE Left 03/02/2015   Procedure: OPEN REDUCTION INTERNAL FIXATION (ORIF) LEFT ANKLE TRIMALLEOLAR FRACTURE;  Surgeon: Toni Arthurs, MD;  Location: [facility] OR;  Service: Orthopedics;  Laterality: Left;   Social History   Occupational History   Not on file  Tobacco Use   Smoking status: Never   Smokeless tobacco: Never  Vaping Use   Vaping Use: Never used  Substance and Sexual Activity   Alcohol use: No   Drug use: No   Sexual activity: Not on file

## 2020-07-11 ENCOUNTER — Telehealth: Payer: Self-pay | Admitting: Internal Medicine

## 2020-07-11 ENCOUNTER — Emergency Department (HOSPITAL_COMMUNITY)
Admission: EM | Admit: 2020-07-11 | Discharge: 2020-07-11 | Disposition: A | Discharge status: Home / Self Care | Payer: Medicare Other | Attending: Emergency Medicine | Admitting: Emergency Medicine

## 2020-07-11 ENCOUNTER — Emergency Department (HOSPITAL_COMMUNITY): Payer: Medicare Other

## 2020-07-11 ENCOUNTER — Other Ambulatory Visit: Payer: Self-pay

## 2020-07-11 ENCOUNTER — Encounter (HOSPITAL_COMMUNITY): Payer: Self-pay

## 2020-07-11 DIAGNOSIS — J45909 Unspecified asthma, uncomplicated: Secondary | ICD-10-CM | POA: Insufficient documentation

## 2020-07-11 DIAGNOSIS — I5033 Acute on chronic diastolic (congestive) heart failure: Secondary | ICD-10-CM | POA: Insufficient documentation

## 2020-07-11 DIAGNOSIS — M25552 Pain in left hip: Secondary | ICD-10-CM | POA: Diagnosis present

## 2020-07-11 DIAGNOSIS — Z7982 Long term (current) use of aspirin: Secondary | ICD-10-CM | POA: Insufficient documentation

## 2020-07-11 DIAGNOSIS — K625 Hemorrhage of anus and rectum: Secondary | ICD-10-CM | POA: Diagnosis not present

## 2020-07-11 DIAGNOSIS — I110 Hypertensive heart disease with heart failure: Secondary | ICD-10-CM | POA: Diagnosis not present

## 2020-07-11 DIAGNOSIS — F840 Autistic disorder: Secondary | ICD-10-CM | POA: Insufficient documentation

## 2020-07-11 DIAGNOSIS — Z79899 Other long term (current) drug therapy: Secondary | ICD-10-CM | POA: Diagnosis not present

## 2020-07-11 LAB — CBC
HCT: 35.3 % — ABNORMAL LOW (ref 39.0–52.0)
Hemoglobin: 10.7 g/dL — ABNORMAL LOW (ref 13.0–17.0)
MCH: 29.1 pg (ref 26.0–34.0)
MCHC: 30.3 g/dL (ref 30.0–36.0)
MCV: 95.9 fL (ref 80.0–100.0)
Platelets: 205 10*3/uL (ref 150–400)
RBC: 3.68 MIL/uL — ABNORMAL LOW (ref 4.22–5.81)
RDW: 14.1 % (ref 11.5–15.5)
WBC: 5.2 10*3/uL (ref 4.0–10.5)
nRBC: 0 % (ref 0.0–0.2)

## 2020-07-11 LAB — TYPE AND SCREEN
ABO/RH(D): A POS
Antibody Screen: NEGATIVE

## 2020-07-11 LAB — COMPREHENSIVE METABOLIC PANEL
ALT: 27 U/L (ref 0–44)
AST: 24 U/L (ref 15–41)
Albumin: 3.5 g/dL (ref 3.5–5.0)
Alkaline Phosphatase: 95 U/L (ref 38–126)
Anion gap: 6 (ref 5–15)
BUN: 19 mg/dL (ref 6–20)
CO2: 31 mmol/L (ref 22–32)
Calcium: 8.6 mg/dL — ABNORMAL LOW (ref 8.9–10.3)
Chloride: 103 mmol/L (ref 98–111)
Creatinine, Ser: 1.34 mg/dL — ABNORMAL HIGH (ref 0.61–1.24)
GFR, Estimated: >60 mL/min (ref >60)
Glucose, Bld: 99 mg/dL (ref 70–99)
Potassium: 4.3 mmol/L (ref 3.5–5.1)
Sodium: 140 mmol/L (ref 135–145)
Total Bilirubin: 0.2 mg/dL — ABNORMAL LOW (ref 0.3–1.2)
Total Protein: 7.3 g/dL (ref 6.5–8.1)

## 2020-07-11 LAB — ABO/RH: ABO/RH(D): A POS

## 2020-07-11 NOTE — ED Triage Notes (Signed)
Patient c/o bright red rectal bleeding that occurred last night.  Patient states that he lives alone. Patient denies any pain. Patient states he had N/ "the other day", but when questioned stated, "I don't know."

## 2020-07-11 NOTE — ED Provider Notes (Signed)
[facility]   CSN: [id number] Arrival date & time: 07/11/20  1039     History Chief Complaint  Patient presents with   Rectal Bleeding   Hip Pain    Phillip OhmCharles G Topor Jr. is a 60 y.o. male.  HPI Patient seen and evaluated once he returned to an exam room after I initially screened him in triage.  Patient complains of left hip pain.  Several times I tried to ascertain what specifically was hurting today, and he denies rectal pain, rectal bleeding, abdominal pain, vomiting.  He does have left hip pain.  No clear onset, no clear alleviating or exacerbating factors.  There is a mismatch in the nose prior to arrival with initial notes suggest patient had left hip pain, but this possible and transcribed into rectal bleeding, the patient denies this.    Past Medical History:  Diagnosis Date   Allergic rhinitis    Anemia of chronic disease    Anxiety    Asthma    Autism    Bladder 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/29/2011   Depression    Gallstone    GERD (gastroesophageal reflux disease)    Hyperlipidemia    Hypertension    IBS (irritable bowel syndrome)    Obesity    OSA (obstructive sleep apnea)    Pneumonia    UTI (urinary tract infection)     Patient Active Problem List   Diagnosis Date Noted   Pre-ulcerative calluses 06/11/2020   Right carpal tunnel syndrome 06/11/2020   Venous insufficiency 03/12/2020   Pain and swelling of left lower leg 06/02/2019   Traumatic arthritis of left knee 06/02/2019   Myalgia 05/10/2019   Right otitis media 04/17/2019   History of DVT (deep vein thrombosis) 02/13/2019   Pre-ulcerative corn or callous 02/09/2019   Lump in throat 02/09/2019   Cough 01/06/2019   Increased abdominal girth 09/29/2018   Rash 09/29/2018   Hematochezia 06/25/2018   Urinary stream slowing 12/13/2017   Plantar fasciitis, left 09/23/2017   Conjunctivitis of left eye 06/11/2017   Left ear hearing loss 06/11/2017   Skin  lesion 06/11/2017   Hyperglycemia 06/11/2017   Cellulitis 07/25/2016   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/11/2016   Right leg pain 06/20/2016   Left ankle pain 06/13/2016   Acute sinus infection 04/12/2016   RLS (restless legs syndrome) 04/12/2016   Respiratory abnormality    Obesity (BMI 30-39.9) 03/02/2015   Acute on chronic diastolic CHF (congestive heart failure) (HCC)    Autism    DVT, lower extremity, distal, acute (HCC)    Closed trimalleolar fracture of left ankle 02/26/2015   Hyperlipidemia 02/19/2014   Depression with anxiety 02/19/2014   Acute respiratory failure with hypoxia (HCC) 02/19/2014   Normocytic anemia 02/19/2014   Community acquired pneumonia 02/18/2014   Left foot pain 02/09/2013   Urinary frequency 02/03/2012   Bilateral hearing loss 02/03/2012   Preventative health care 11/28/2010   Obstructive sleep apnea 08/14/2009   Essential hypertension 02/16/2007   Allergic rhinitis 02/16/2007   GERD 02/16/2007    Past Surgical History:  Procedure Laterality Date   ORIF ANKLE FRACTURE Left 03/02/2015   Procedure: OPEN REDUCTION INTERNAL FIXATION (ORIF) LEFT ANKLE TRIMALLEOLAR FRACTURE;  Surgeon: Toni ArthursJohn Hewitt, MD;  Location: [facility] OR;  Service: Orthopedics;  Laterality: Left;       Family History  Problem Relation Age of Onset   Irritable bowel syndrome Mother    Heart disease Father    Alcohol  abuse Other    Arthritis Other    Breast cancer Other    Prostate cancer Other    Hyperlipidemia Other    Heart disease Other    Stroke Other    Kidney disease Other    Diabetes Other    Pancreatic cancer Other    Colon cancer Neg Hx    Esophageal cancer Neg Hx     Social History   Tobacco Use   Smoking status: Never   Smokeless tobacco: Never  Vaping Use   Vaping Use: Never used  Substance Use Topics   Alcohol use: No   Drug use: No    Home Medications Prior to Admission medications   Medication Sig Start Date End Date Taking? Authorizing  Provider  albuterol (VENTOLIN HFA) 108 (90 Base) MCG/ACT inhaler Inhale 2 puffs into the lungs every 6 (six) hours as needed for wheezing or shortness of breath. 06/25/18   Corwin Levins, MD  apixaban (ELIQUIS) 5 MG TABS tablet Take 1 tablet (5 mg total) by mouth 2 (two) times daily. 03/29/19   Corwin Levins, MD  aspirin EC 81 MG tablet Take 81 mg by mouth daily.    [provider]  atorvastatin (LIPITOR) 40 MG tablet Take 1 tablet (40 mg total) by mouth daily. 03/29/19   Corwin Levins, MD  buPROPion (WELLBUTRIN XL) 300 MG 24 hr tablet Take 1 tablet (300 mg total) by mouth daily. 04/16/19   Corwin Levins, MD  cholecalciferol (VITAMIN D3) 25 MCG (1000 UNIT) tablet Take 1,000 Units by mouth daily.    [provider]  fexofenadine (ALLEGRA) 180 MG tablet Take 1 tablet (180 mg total) by mouth daily. 03/29/19 03/28/20  Corwin Levins, MD  furosemide (LASIX) 40 MG tablet Take 1 tablet (40 mg total) by mouth daily. 03/29/19 06/27/19  Corwin Levins, MD  gabapentin (NEURONTIN) 300 MG capsule TAKE 2 CAPSULES (600 MG TOTAL) BY MOUTH AT BEDTIME. 02/28/20   Corwin Levins, MD  hydroxypropyl methylcellulose / hypromellose (ISOPTO TEARS / GONIOVISC) 2.5 % ophthalmic solution Place 1 drop into both eyes 3 (three) times daily as needed for dry eyes.    [provider]  lisinopril (ZESTRIL) 20 MG tablet Take 1 tablet (20 mg total) by mouth daily. 03/07/20   Corwin Levins, MD  metoprolol tartrate (LOPRESSOR) 25 MG tablet Take 0.5 tablets (12.5 mg total) by mouth 2 (two) times daily. 03/07/20 06/05/20  Corwin Levins, MD  nystatin (MYCOSTATIN/NYSTOP) powder Use as directed twice daily as needed for groin and scrotal rash 04/16/19   Corwin Levins, MD  omeprazole (PRILOSEC) 20 MG capsule TAKE 1 CAPSULE BY MOUTH EVERY DAY 05/30/20   Corwin Levins, MD  predniSONE (DELTASONE) 10 MG tablet 3 tabs by mouth per day for 3 days,2tabs per day for 3 days,1tab per day for 3 days 06/08/20   Corwin Levins, MD  sertraline (ZOLOFT) 100  MG tablet TAKE 2 TABLETS BY MOUTH EVERY DAY 02/28/20   Corwin Levins, MD  tamsulosin (FLOMAX) 0.4 MG CAPS capsule Take 1 capsule (0.4 mg total) by mouth 2 (two) times daily. 03/29/19   Corwin Levins, MD  traZODone (DESYREL) 50 MG tablet Take 1 tablet (50 mg total) by mouth at bedtime as needed for sleep. 06/08/20   Corwin Levins, MD  triamcinolone (KENALOG) 0.1 % Apply 1 application topically 2 (two) times daily. To legs for itching 03/07/20   Corwin Levins, MD  triamcinolone (  NASACORT) 55 MCG/ACT AERO nasal inhaler Place 2 sprays into the nose daily. 03/29/19   Corwin Levins, MD  vitamin B-12 (CYANOCOBALAMIN) 1000 MCG tablet Take 1,000 mcg by mouth daily.    [provider]    Allergies    Fluoxetine hcl and Xanax [alprazolam]  Review of Systems   Review of Systems  Unable to perform ROS: Other  Cognitive impairment Physical Exam Updated Vital Signs BP (!) 182/94   Pulse 61   Temp 98.7 F (37.1 C) (Oral)   Resp 20   Ht 5\' 10"  (1.778 m)   Wt (!) 144.7 kg   SpO2 95%   BMI 45.77 kg/m   Physical Exam Vitals and nursing note reviewed.  Constitutional:      General: He is not in acute distress.    Appearance: He is well-developed. He is obese. He is not ill-appearing, toxic-appearing or diaphoretic.  HENT:     Head: Normocephalic and atraumatic.  Eyes:     Conjunctiva/sclera: Conjunctivae normal.  Cardiovascular:     Rate and Rhythm: Normal rate and regular rhythm.  Pulmonary:     Effort: Pulmonary effort is normal. No respiratory distress.     Breath sounds: No stridor.  Abdominal:     General: There is no distension.     Comments: Protuberant abdomen, not tender, no guarding  Musculoskeletal:     Comments: Patient is able to bear weight, flexes left hip, extend, no gross deformity.  He does have some tenderness palpation with pressure on the lateral superior aspect.  Skin:    General: Skin is warm and dry.     Comments: Melanocytic nevi back in a pattern consistent with  pityriasis  Neurological:     Mental Status: He is alert.     Comments: Beyond patient's difficult to understand speech, cranial nerves are intact.  He moves all extremities spontaneously, follows commands.  Psychiatric:        Cognition and Memory: Cognition is impaired.    ED Results / Procedures / Treatments   Labs (all labs ordered are listed, but only abnormal results are displayed) Labs Reviewed  COMPREHENSIVE METABOLIC PANEL - Abnormal; Notable for the following components:      Result Value   Creatinine, Ser 1.34 (*)    Calcium 8.6 (*)    Total Bilirubin 0.2 (*)    All other components within normal limits  CBC - Abnormal; Notable for the following components:   RBC 3.68 (*)    Hemoglobin 10.7 (*)    HCT 35.3 (*)    All other components within normal limits  POC OCCULT BLOOD, ED  TYPE AND SCREEN  ABO/RH    EKG None  Radiology DG Hip Unilat With Pelvis 2-3 Views Left  Result Date: 07/11/2020 CLINICAL DATA:  Pain.  Right red rectal bleeding. EXAM: DG HIP (WITH OR WITHOUT PELVIS) 2-3V LEFT COMPARISON:  No recent prior. FINDINGS: No acute bony or joint abnormality identified. No evidence of fracture dislocation. Soft tissues are unremarkable. Small pelvic calcification most likely phleboliths. IMPRESSION: No acute abnormality. Electronically Signed   By: 07/13/2020  Register   On: 07/11/2020 16:10    Procedures Procedures   Medications Ordered in ED Medications - No data to display  ED Course  I have reviewed the triage vital signs and the nursing notes.  Pertinent labs & imaging results that were available during my care of the patient were reviewed by me and considered in my medical decision making (see chart  for details).  5:07 PM Adult male with autism presents with hip pain, though there is some concern for prior rectal bleeding.  Patient himself denies this, denies rectal pain, has no hemodynamic instability no abdominal pain.  Hemoglobin slightly lower, but  without complaints of this, hemodynamic stability, patient may follow this as an outpatient. Patient's other studies including labs, x-ray all reassuring similarly, patient discharged, in stable condition. Final Clinical Impression(s) / ED Diagnoses Final diagnoses:  Left hip pain     Gerhard Munch, MD 07/11/20 1707

## 2020-07-11 NOTE — Telephone Encounter (Signed)
Patient said that he is bleeding from his hips. He said that he does not know how long it has been going on. Transferred to [facility]

## 2020-07-11 NOTE — ED Provider Notes (Signed)
Emergency Medicine Provider Triage Evaluation Note  Phillip Butler. , a 60 y.o. male  was evaluated in triage.  Pt complains of hip pain and/or bleeding, left side pain.  This is inconsistent with note from outside referral which suggest possible rectal bleeding, but the patient deni2es this  Review of Systems  Positive: Pain Negative: Fever, hematuria, rectal bleeding, dyspnea  Physical Exam  BP (!) 165/84 (BP Location: Right Arm)   Pulse (!) 58   Temp 98.7 F (37.1 C) (Oral)   Resp 17   Ht 5\' 10"  (1.778 m)   Wt (!) 144.7 kg   SpO2 92%   BMI 45.77 kg/m  Gen:   Awake, no distress sitting upright Resp:  Normal effort no increased work of breathing MSK:   Moves extremities without difficulty gross edema bilaterally Other:  Patient's speech is somewhat difficult to understand  Medical Decision Making  Medically screening exam initiated at 12:08 PM.  Appropriate orders placed.  . was informed that the remainder of the evaluation will be completed by another provider, this initial triage assessment does not replace that evaluation, and the importance of remaining in the ED until their evaluation is complete.  Adult male with multiple medical issues presents with left-sided pain.  He is awake, alert, afebrile, hemodynamically unremarkable on initial evaluation.  Patient will require additional evaluation once placed in exam room.   [name], MD 07/11/20 1210

## 2020-07-11 NOTE — Telephone Encounter (Signed)
[facility] FYI:  ---Caller wants to make an appointment. Caller has rectal bleeding  Advised for pt to go to the ED

## 2020-07-11 NOTE — Discharge Instructions (Addendum)
Today's evaluation was reassuring.  But if you develop new, or concerning changes do not hesitate to return here.  Otherwise, follow-up with your physician via telephone tomorrow to ensure appropriate ongoing care.

## 2020-07-12 NOTE — Telephone Encounter (Signed)
Followed up with patient he states he went to the emergency room.

## 2020-07-19 NOTE — Telephone Encounter (Signed)
Error

## 2020-07-26 ENCOUNTER — Encounter: Payer: Self-pay | Admitting: Internal Medicine

## 2020-07-26 ENCOUNTER — Telehealth: Payer: Self-pay | Admitting: Internal Medicine

## 2020-07-26 ENCOUNTER — Other Ambulatory Visit: Payer: Self-pay

## 2020-07-26 ENCOUNTER — Ambulatory Visit (INDEPENDENT_AMBULATORY_CARE_PROVIDER_SITE_OTHER): Payer: Medicare Other | Admitting: Internal Medicine

## 2020-07-26 VITALS — BP 170/74 | HR 67 | Temp 98.0°F | Ht 70.0 in | Wt 325.0 lb

## 2020-07-26 DIAGNOSIS — F418 Other specified anxiety disorders: Secondary | ICD-10-CM | POA: Diagnosis not present

## 2020-07-26 DIAGNOSIS — J9611 Chronic respiratory failure with hypoxia: Secondary | ICD-10-CM

## 2020-07-26 DIAGNOSIS — R739 Hyperglycemia, unspecified: Secondary | ICD-10-CM

## 2020-07-26 NOTE — Patient Instructions (Signed)
Unfortunately , we could not prescribe the xanax as it made you too sleepy in the past  Please continue all other medications as before, and refills have been done if requested.  Please have the pharmacy call with any other refills you may need.  Please continue your efforts at being more active, low cholesterol diet, and weight control.  Please keep your appointments with your specialists as you may have planned  Please make an Appointment to return in 6 months, or sooner if needed

## 2020-07-26 NOTE — Telephone Encounter (Signed)
   Patient brother in law called and was wondering if the patient is to continue oxygen. He can be reached at [phone]
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
## 2020-07-26 NOTE — Assessment & Plan Note (Signed)
Lab Results  Component Value Date   HGBA1C 6.7 (H) 05/10/2019   Stable, pt to continue current medical treatment  - diet

## 2020-07-26 NOTE — Assessment & Plan Note (Signed)
Pt requests restart xanax but had issue with sedation prior, so I had to defer this

## 2020-07-27 NOTE — Telephone Encounter (Signed)
Should be continuous

## 2020-07-27 NOTE — Telephone Encounter (Signed)
Spoke with patient's brother-in-law and he would like to know if patient needs oxygen 24/7 or just for a few hours daily?

## 2020-07-28 NOTE — Telephone Encounter (Signed)
Patient's brother-in-law notified. 

## 2020-08-24 ENCOUNTER — Other Ambulatory Visit: Payer: Self-pay | Admitting: Internal Medicine

## 2020-08-29 ENCOUNTER — Telehealth: Payer: Self-pay

## 2020-08-29 ENCOUNTER — Telehealth: Payer: Self-pay | Admitting: Physical Medicine and Rehabilitation

## 2020-08-29 NOTE — Telephone Encounter (Signed)
Pt called and would like to reschedule his appt on the 12th  CB [phone]

## 2020-08-29 NOTE — Telephone Encounter (Signed)
Pt called and wants to resch his appt Friday the 12th with Dr. Alvester Morin

## 2020-08-31 ENCOUNTER — Other Ambulatory Visit: Payer: Self-pay | Admitting: Internal Medicine

## 2020-08-31 ENCOUNTER — Ambulatory Visit (INDEPENDENT_AMBULATORY_CARE_PROVIDER_SITE_OTHER): Payer: Medicare Other

## 2020-08-31 ENCOUNTER — Other Ambulatory Visit: Payer: Self-pay

## 2020-08-31 VITALS — BP 130/80 | HR 73 | Temp 98.0°F | Ht 70.0 in | Wt 329.2 lb

## 2020-08-31 DIAGNOSIS — Z0000 Encounter for general adult medical examination without abnormal findings: Secondary | ICD-10-CM | POA: Diagnosis not present

## 2020-08-31 MED ORDER — APIXABAN 5 MG PO TABS: 5.0000 mg | ORAL_TABLET | Freq: Two times a day (BID) | ORAL | 3 refills | Status: DC

## 2020-08-31 MED ORDER — TRAZODONE HCL 50 MG PO TABS
50.0000 mg | ORAL_TABLET | Freq: Every evening | ORAL | 1 refills | Status: DC | PRN
Start: 2020-08-31 — End: 2021-08-13

## 2020-08-31 NOTE — Patient Instructions (Signed)
Phillip Butler , Thank you for taking time to come for your Medicare Wellness Visit. I appreciate your ongoing commitment to your health goals. Please review the following plan we discussed and let me know if I can assist you in the future.   Screening recommendations/referrals: Colonoscopy: need colonoscopy; will discuss with Dr. Jonny Ruiz  Recommended yearly ophthalmology/optometry visit for glaucoma screening and checkup Recommended yearly dental visit for hygiene and checkup  Vaccinations: Influenza vaccine: 03/07/2020 Pneumococcal vaccine: need vaccine; discuss with Dr. Jonny Ruiz Tdap vaccine: 07/31/2017; due every 10 years Shingles vaccine: Please call your insurance company to determine your out of pocket expense for the Shingrix vaccine. You may receive this vaccine at your local pharmacy.   Covid-19: declined  Advanced directives: Advance directive discussed with you today. Even though you declined this today please call our office should you change your mind and we can give you the proper paperwork for you to fill out.  Conditions/risks identified: Yes; Client understands the importance of follow-up with providers by attending scheduled visits and discussed goals to eat healthier, increase physical activity, exercise the brain, socialize more, get enough sleep and make time for laughter.  Next appointment: Please schedule your next Medicare Wellness Visit with your Nurse Health Advisor in 1 year by calling [phone].  Preventive Care 40-64 Years, Male Preventive care refers to lifestyle choices and visits with your health care provider that can promote health and wellness. What does preventive care include? A yearly physical exam. This is also called an annual well check. Dental exams once or twice a year. Routine eye exams. Ask your health care provider how often you should have your eyes checked. Personal lifestyle choices, including: Daily care of your teeth and gums. Regular physical  activity. Eating a healthy diet. Avoiding tobacco and drug use. Limiting alcohol use. Practicing safe sex. Taking low-dose aspirin every day starting at age 27. What happens during an annual well check? The services and screenings done by your health care provider during your annual well check will depend on your age, overall health, lifestyle risk factors, and family history of disease. Counseling  Your health care provider may ask you questions about your: Alcohol use. Tobacco use. Drug use. Emotional well-being. Home and relationship well-being. Sexual activity. Eating habits. Work and work Astronomer. Screening  You may have the following tests or measurements: Height, weight, and BMI. Blood pressure. Lipid and cholesterol levels. These may be checked every 5 years, or more frequently if you are over 81 years old. Skin check. Lung cancer screening. You may have this screening every year starting at age 34 if you have a 30-pack-year history of smoking and currently smoke or have quit within the past 15 years. Fecal occult blood test (FOBT) of the stool. You may have this test every year starting at age 36. Flexible sigmoidoscopy or colonoscopy. You may have a sigmoidoscopy every 5 years or a colonoscopy every 10 years starting at age 83. Prostate cancer screening. Recommendations will vary depending on your family history and other risks. Hepatitis C blood test. Hepatitis B blood test. Sexually transmitted disease (STD) testing. Diabetes screening. This is done by checking your blood sugar (glucose) after you have not eaten for a while (fasting). You may have this done every 1-3 years. Discuss your test results, treatment options, and if necessary, the need for more tests with your health care provider. Vaccines  Your health care provider may recommend certain vaccines, such as: Influenza vaccine. This is recommended every year. Tetanus,  diphtheria, and acellular pertussis  (Tdap, Td) vaccine. You may need a Td booster every 10 years. Zoster vaccine. You may need this after age 65. Pneumococcal 13-valent conjugate (PCV13) vaccine. You may need this if you have certain conditions and have not been vaccinated. Pneumococcal polysaccharide (PPSV23) vaccine. You may need one or two doses if you smoke cigarettes or if you have certain conditions. Talk to your health care provider about which screenings and vaccines you need and how often you need them. This information is not intended to replace advice given to you by your health care provider. Make sure you discuss any questions you have with your health care provider. Document Released: 02/03/2015 Document Revised: 09/27/2015 Document Reviewed: 11/08/2014 Elsevier Interactive Patient Education  2017 [employer].  Fall Prevention in the Home Falls can cause injuries. They can happen to people of all ages. There are many things you can do to make your home safe and to help prevent falls. What can I do on the outside of my home? Regularly fix the edges of walkways and driveways and fix any cracks. Remove anything that might make you trip as you walk through a door, such as a raised step or threshold. Trim any bushes or trees on the path to your home. Use bright outdoor lighting. Clear any walking paths of anything that might make someone trip, such as rocks or tools. Regularly check to see if handrails are loose or broken. Make sure that both sides of any steps have handrails. Any raised decks and porches should have guardrails on the edges. Have any leaves, snow, or ice cleared regularly. Use sand or salt on walking paths during winter. Clean up any spills in your garage right away. This includes oil or grease spills. What can I do in the bathroom? Use night lights. Install grab bars by the toilet and in the tub and shower. Do not use towel bars as grab bars. Use non-skid mats or decals in the tub or shower. If  you need to sit down in the shower, use a plastic, non-slip stool. Keep the floor dry. Clean up any water that spills on the floor as soon as it happens. Remove soap buildup in the tub or shower regularly. Attach bath mats securely with double-sided non-slip rug tape. Do not have throw rugs and other things on the floor that can make you trip. What can I do in the bedroom? Use night lights. Make sure that you have a light by your bed that is easy to reach. Do not use any sheets or blankets that are too big for your bed. They should not hang down onto the floor. Have a firm chair that has side arms. You can use this for support while you get dressed. Do not have throw rugs and other things on the floor that can make you trip. What can I do in the kitchen? Clean up any spills right away. Avoid walking on wet floors. Keep items that you use a lot in easy-to-reach places. If you need to reach something above you, use a strong step stool that has a grab bar. Keep electrical cords out of the way. Do not use floor polish or wax that makes floors slippery. If you must use wax, use non-skid floor wax. Do not have throw rugs and other things on the floor that can make you trip. What can I do with my stairs? Do not leave any items on the stairs. Make sure that there are handrails  on both sides of the stairs and use them. Fix handrails that are broken or loose. Make sure that handrails are as long as the stairways. Check any carpeting to make sure that it is firmly attached to the stairs. Fix any carpet that is loose or worn. Avoid having throw rugs at the top or bottom of the stairs. If you do have throw rugs, attach them to the floor with carpet tape. Make sure that you have a light switch at the top of the stairs and the bottom of the stairs. If you do not have them, ask someone to add them for you. What else can I do to help prevent falls? Wear shoes that: Do not have high heels. Have rubber  bottoms. Are comfortable and fit you well. Are closed at the toe. Do not wear sandals. If you use a stepladder: Make sure that it is fully opened. Do not climb a closed stepladder. Make sure that both sides of the stepladder are locked into place. Ask someone to hold it for you, if possible. Clearly mark and make sure that you can see: Any grab bars or handrails. First and last steps. Where the edge of each step is. Use tools that help you move around (mobility aids) if they are needed. These include: Canes. Walkers. Scooters. Crutches. Turn on the lights when you go into a dark area. Replace any light bulbs as soon as they burn out. Set up your furniture so you have a clear path. Avoid moving your furniture around. If any of your floors are uneven, fix them. If there are any pets around you, be aware of where they are. Review your medicines with your doctor. Some medicines can make you feel dizzy. This can increase your chance of falling. Ask your doctor what other things that you can do to help prevent falls. This information is not intended to replace advice given to you by your health care provider. Make sure you discuss any questions you have with your health care provider. Document Released: 11/03/2008 Document Revised: 06/15/2015 Document Reviewed: 02/11/2014 Elsevier Interactive Patient Education  2017 [employer].

## 2020-08-31 NOTE — Patient Instructions (Addendum)
Phillip Butler , Thank you for taking time to come for your Medicare Wellness Visit. I appreciate your ongoing commitment to your health goals. Please review the following plan we discussed and let me know if I can assist you in the future.   Screening recommendations/referrals: Colonoscopy: never done Recommended yearly ophthalmology/optometry visit for glaucoma screening and checkup Recommended yearly dental visit for hygiene and checkup  Vaccinations: Influenza vaccine: 03/07/2020 Pneumococcal vaccine: never done Tdap vaccine: 07/31/2017; due every 10 years Shingles vaccine: never done   Covid-19: never done  Advanced directives: Advance directive discussed with you today. Even though you declined this today please call our office should you change your mind and we can give you the proper paperwork for you to fill out.  Conditions/risks identified: Yes; Client understands the importance of follow-up with providers by attending scheduled visits and discussed goals to eat healthier, increase physical activity, exercise the brain, socialize more, get enough sleep and make time for laughter.  Next appointment: Please schedule your next Medicare Wellness Visit with your Nurse Health Advisor in 1 year by calling [phone].  Preventive Care 40-64 Years, Male Preventive care refers to lifestyle choices and visits with your health care provider that can promote health and wellness. What does preventive care include? A yearly physical exam. This is also called an annual well check. Dental exams once or twice a year. Routine eye exams. Ask your health care provider how often you should have your eyes checked. Personal lifestyle choices, including: Daily care of your teeth and gums. Regular physical activity. Eating a healthy diet. Avoiding tobacco and drug use. Limiting alcohol use. Practicing safe sex. Taking low-dose aspirin every day starting at age 69. What happens during an annual well  check? The services and screenings done by your health care provider during your annual well check will depend on your age, overall health, lifestyle risk factors, and family history of disease. Counseling  Your health care provider may ask you questions about your: Alcohol use. Tobacco use. Drug use. Emotional well-being. Home and relationship well-being. Sexual activity. Eating habits. Work and work Astronomer. Screening  You may have the following tests or measurements: Height, weight, and BMI. Blood pressure. Lipid and cholesterol levels. These may be checked every 5 years, or more frequently if you are over 50 years old. Skin check. Lung cancer screening. You may have this screening every year starting at age 57 if you have a 30-pack-year history of smoking and currently smoke or have quit within the past 15 years. Fecal occult blood test (FOBT) of the stool. You may have this test every year starting at age 45. Flexible sigmoidoscopy or colonoscopy. You may have a sigmoidoscopy every 5 years or a colonoscopy every 10 years starting at age 55. Prostate cancer screening. Recommendations will vary depending on your family history and other risks. Hepatitis C blood test. Hepatitis B blood test. Sexually transmitted disease (STD) testing. Diabetes screening. This is done by checking your blood sugar (glucose) after you have not eaten for a while (fasting). You may have this done every 1-3 years. Discuss your test results, treatment options, and if necessary, the need for more tests with your health care provider. Vaccines  Your health care provider may recommend certain vaccines, such as: Influenza vaccine. This is recommended every year. Tetanus, diphtheria, and acellular pertussis (Tdap, Td) vaccine. You may need a Td booster every 10 years. Zoster vaccine. You may need this after age 87. Pneumococcal 13-valent conjugate (PCV13) vaccine. You may  need this if you have certain  conditions and have not been vaccinated. Pneumococcal polysaccharide (PPSV23) vaccine. You may need one or two doses if you smoke cigarettes or if you have certain conditions. Talk to your health care provider about which screenings and vaccines you need and how often you need them. This information is not intended to replace advice given to you by your health care provider. Make sure you discuss any questions you have with your health care provider. Document Released: 02/03/2015 Document Revised: 09/27/2015 Document Reviewed: 11/08/2014 Elsevier Interactive Patient Education  2017 [employer].  Fall Prevention in the Home Falls can cause injuries. They can happen to people of all ages. There are many things you can do to make your home safe and to help prevent falls. What can I do on the outside of my home? Regularly fix the edges of walkways and driveways and fix any cracks. Remove anything that might make you trip as you walk through a door, such as a raised step or threshold. Trim any bushes or trees on the path to your home. Use bright outdoor lighting. Clear any walking paths of anything that might make someone trip, such as rocks or tools. Regularly check to see if handrails are loose or broken. Make sure that both sides of any steps have handrails. Any raised decks and porches should have guardrails on the edges. Have any leaves, snow, or ice cleared regularly. Use sand or salt on walking paths during winter. Clean up any spills in your garage right away. This includes oil or grease spills. What can I do in the bathroom? Use night lights. Install grab bars by the toilet and in the tub and shower. Do not use towel bars as grab bars. Use non-skid mats or decals in the tub or shower. If you need to sit down in the shower, use a plastic, non-slip stool. Keep the floor dry. Clean up any water that spills on the floor as soon as it happens. Remove soap buildup in the tub or shower  regularly. Attach bath mats securely with double-sided non-slip rug tape. Do not have throw rugs and other things on the floor that can make you trip. What can I do in the bedroom? Use night lights. Make sure that you have a light by your bed that is easy to reach. Do not use any sheets or blankets that are too big for your bed. They should not hang down onto the floor. Have a firm chair that has side arms. You can use this for support while you get dressed. Do not have throw rugs and other things on the floor that can make you trip. What can I do in the kitchen? Clean up any spills right away. Avoid walking on wet floors. Keep items that you use a lot in easy-to-reach places. If you need to reach something above you, use a strong step stool that has a grab bar. Keep electrical cords out of the way. Do not use floor polish or wax that makes floors slippery. If you must use wax, use non-skid floor wax. Do not have throw rugs and other things on the floor that can make you trip. What can I do with my stairs? Do not leave any items on the stairs. Make sure that there are handrails on both sides of the stairs and use them. Fix handrails that are broken or loose. Make sure that handrails are as long as the stairways. Check any carpeting to make sure  that it is firmly attached to the stairs. Fix any carpet that is loose or worn. Avoid having throw rugs at the top or bottom of the stairs. If you do have throw rugs, attach them to the floor with carpet tape. Make sure that you have a light switch at the top of the stairs and the bottom of the stairs. If you do not have them, ask someone to add them for you. What else can I do to help prevent falls? Wear shoes that: Do not have high heels. Have rubber bottoms. Are comfortable and fit you well. Are closed at the toe. Do not wear sandals. If you use a stepladder: Make sure that it is fully opened. Do not climb a closed stepladder. Make sure that  both sides of the stepladder are locked into place. Ask someone to hold it for you, if possible. Clearly mark and make sure that you can see: Any grab bars or handrails. First and last steps. Where the edge of each step is. Use tools that help you move around (mobility aids) if they are needed. These include: Canes. Walkers. Scooters. Crutches. Turn on the lights when you go into a dark area. Replace any light bulbs as soon as they burn out. Set up your furniture so you have a clear path. Avoid moving your furniture around. If any of your floors are uneven, fix them. If there are any pets around you, be aware of where they are. Review your medicines with your doctor. Some medicines can make you feel dizzy. This can increase your chance of falling. Ask your doctor what other things that you can do to help prevent falls. This information is not intended to replace advice given to you by your health care provider. Make sure you discuss any questions you have with your health care provider. Document Released: 11/03/2008 Document Revised: 06/15/2015 Document Reviewed: 02/11/2014 Elsevier Interactive Patient Education  2017 [employer].

## 2020-08-31 NOTE — Progress Notes (Signed)
Subjective:   Kouper Spinella. is a 60 y.o. male who presents for Medicare Annual/Subsequent preventive examination.  Review of Systems     Cardiac Risk Factors include: advanced age (>29men, >57 women);hypertension;male gender;obesity (BMI >30kg/m2);dyslipidemia;family history of premature cardiovascular disease     Objective:    Today's Vitals   08/31/20 1404  BP: 130/80  Pulse: 73  Temp: 98 F (36.7 C)  SpO2: 91%  Weight: (!) 329 lb 3.2 oz (149.3 kg)  Height: 5\' 10"  (1.778 m)  PainSc: 0-No pain   Body mass index is 47.24 kg/m.  Advanced Directives 08/31/2020 07/11/2020 01/25/2020 12/24/2019 02/12/2019 01/29/2016 03/30/2015  Does Patient Have a Medical Advance Directive? No No (No Data) No No No Yes  Would patient like information on creating a medical advance directive? No - Patient declined No - Patient declined - No - Patient declined No - Patient declined - -    Current Medications (verified) Outpatient Encounter Medications as of 08/31/2020  Medication Sig   gabapentin (NEURONTIN) 300 MG capsule TAKE 2 CAPSULES BY MOUTH AT BEDTIME.   albuterol (VENTOLIN HFA) 108 (90 Base) MCG/ACT inhaler Inhale 2 puffs into the lungs every 6 (six) hours as needed for wheezing or shortness of breath.   apixaban (ELIQUIS) 5 MG TABS tablet Take 1 tablet (5 mg total) by mouth 2 (two) times daily.   aspirin EC 81 MG tablet Take 81 mg by mouth daily.   atorvastatin (LIPITOR) 40 MG tablet Take 1 tablet (40 mg total) by mouth daily.   buPROPion (WELLBUTRIN XL) 300 MG 24 hr tablet Take 1 tablet (300 mg total) by mouth daily.   furosemide (LASIX) 40 MG tablet Take 1 tablet (40 mg total) by mouth daily.   halobetasol (ULTRAVATE) 0.05 % ointment Apply 1 application topically 2 (two) times daily.   hydroxypropyl methylcellulose / hypromellose (ISOPTO TEARS / GONIOVISC) 2.5 % ophthalmic solution Place 1 drop into both eyes 3 (three) times daily as needed for dry eyes.   lisinopril (ZESTRIL) 20 MG tablet  Take 1 tablet (20 mg total) by mouth daily.   metoprolol tartrate (LOPRESSOR) 25 MG tablet Take 0.5 tablets (12.5 mg total) by mouth 2 (two) times daily. (Patient taking differently: Take 25 mg by mouth daily.)   mupirocin ointment (BACTROBAN) 2 % Apply 1 application topically 2 (two) times daily.   nystatin (MYCOSTATIN/NYSTOP) powder Use as directed twice daily as needed for groin and scrotal rash (Patient taking differently: Apply 1 application topically 2 (two) times daily. Use as directed twice daily as needed for groin and scrotal rash)   omeprazole (PRILOSEC) 20 MG capsule TAKE 1 CAPSULE BY MOUTH EVERY DAY (Patient taking differently: Take 20 mg by mouth daily.)   predniSONE (DELTASONE) 10 MG tablet 3 tabs by mouth per day for 3 days,2tabs per day for 3 days,1tab per day for 3 days (Patient taking differently: Take 10 mg by mouth daily with breakfast.)   sertraline (ZOLOFT) 100 MG tablet TAKE 2 TABLETS BY MOUTH EVERY DAY (Patient taking differently: Take 100 mg by mouth at bedtime.)   tamsulosin (FLOMAX) 0.4 MG CAPS capsule Take 1 capsule (0.4 mg total) by mouth 2 (two) times daily. (Patient taking differently: Take 0.4 mg by mouth daily.)   traZODone (DESYREL) 50 MG tablet Take 1 tablet (50 mg total) by mouth at bedtime as needed for sleep.   triamcinolone (KENALOG) 0.1 % Apply 1 application topically 2 (two) times daily. To legs for itching   triamcinolone (NASACORT) 55  MCG/ACT AERO nasal inhaler Place 2 sprays into the nose daily.   No facility-administered encounter medications on file as of 08/31/2020.    Allergies (verified) Fluoxetine hcl and Xanax [alprazolam]   History: Past Medical History:  Diagnosis Date   Allergic rhinitis    Anemia of chronic disease    Anxiety    Asthma    Autism    Bladder 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/29/2011   Depression    Gallstone    GERD (gastroesophageal reflux disease)    Hyperlipidemia    Hypertension    IBS (irritable bowel syndrome)    Obesity     OSA (obstructive sleep apnea)    Pneumonia    UTI (urinary tract infection)    Past Surgical History:  Procedure Laterality Date   ORIF ANKLE FRACTURE Left 03/02/2015   Procedure: OPEN REDUCTION INTERNAL FIXATION (ORIF) LEFT ANKLE TRIMALLEOLAR FRACTURE;  Surgeon: Toni Arthurs, MD;  Location: [facility] OR;  Service: Orthopedics;  Laterality: Left;   Family History  Problem Relation Age of Onset   Irritable bowel syndrome Mother    Heart disease Father    Alcohol abuse Other    Arthritis Other    Breast cancer Other    Prostate cancer Other    Hyperlipidemia Other    Heart disease Other    Stroke Other    Kidney disease Other    Diabetes Other    Pancreatic cancer Other    Colon cancer Neg Hx    Esophageal cancer Neg Hx    Social History   Socioeconomic History   Marital status: Single    Spouse name: Not on file   Number of children: Not on file   Years of education: Not on file   Highest education level: Not on file  Occupational History   Not on file  Tobacco Use   Smoking status: Never   Smokeless tobacco: Never  Vaping Use   Vaping Use: Never used  Substance and Sexual Activity   Alcohol use: No   Drug use: No   Sexual activity: Not on file  Other Topics Concern   Not on file  Social History Narrative   Not on file   Social Determinants of Health   Financial Resource Strain: Low Risk    Difficulty of Paying Living Expenses: Not hard at all  Food Insecurity: No Food Insecurity   Worried About Programme researcher, broadcasting/film/video in the Last Year: Never true   Ran Out of Food in the Last Year: Never true  Transportation Needs: No Transportation Needs   Lack of Transportation (Medical): No   Lack of Transportation (Non-Medical): No  Physical Activity: Inactive   Days of Exercise per Week: 0 days   Minutes of Exercise per Session: 0 min  Stress: No Stress Concern Present   Feeling of Stress : Not at all  Social Connections: Not on file    Tobacco Counseling Counseling given:  Not Answered   Clinical Intake:  Pre-visit preparation completed: Yes  Pain : No/denies pain Pain Score: 0-No pain     BMI - recorded: 47.24 Nutritional Status: BMI > 30  Obese Nutritional Risks: None Diabetes: No  How often do you need to have someone help you when you read instructions, pamphlets, or other written materials from your doctor or pharmacy?: 1 - Never What is the last grade level you completed in school?: GED  Diabetic? no  Interpreter Needed?: No  Information entered by :: Susie Cassette, LPN   Activities  of Daily Living In your present state of health, do you have any difficulty performing the following activities: 08/31/2020  Hearing? Y  Vision? N  Difficulty concentrating or making decisions? Y  Walking or climbing stairs? Y  Dressing or bathing? Y  Doing errands, shopping? Y  Preparing Food and eating ? Y  Using the Toilet? Y  In the past six months, have you accidently leaked urine? N  Do you have problems with loss of bowel control? N  Managing your Medications? Y  Managing your Finances? Y  Housekeeping or managing your Housekeeping? Y  Some recent data might be hidden    Patient Care Team: Corwin LevinsJohn, James W, MD as PCP - General  Indicate any recent Medical Services you may have received from other than [facility] providers in the past year (date may be approximate).     Assessment:   This is a routine wellness examination for Rena Laraharles.  Hearing/Vision screen Hearing Screening - Comments:: Patient has some decreased hearing issues.  No hearing aids. Referral needed. Vision Screening - Comments:: Patient has not had a recent eye exam.  Referral needed.  Dietary issues and exercise activities discussed: Current Exercise Habits: The patient does not participate in regular exercise at present, Exercise limited by: psychological condition(s);respiratory conditions(s);orthopedic condition(s)   Goals Addressed   None   Depression Screen PHQ 2/9  Scores 08/31/2020 03/07/2020 06/02/2019 05/10/2019 05/10/2019 02/26/2017  PHQ - 2 Score 2 0 3 1 5 4   PHQ- 9 Score - - 8 - 19 4    Fall Risk Fall Risk  08/31/2020 03/07/2020 06/02/2019 05/10/2019 05/10/2019  Falls in the past year? 1 0 1 0 1  Number falls in past yr: 1 - 0 - 0  Injury with Fall? 0 - 1 - 0  Risk for fall due to : History of fall(s) - History of fall(s) - Impaired balance/gait;Impaired mobility;Impaired vision  Follow up Falls evaluation completed - Falls evaluation completed - Falls evaluation completed    FALL RISK PREVENTION PERTAINING TO THE HOME:  Any stairs in or around the home? No  If so, are there any without handrails? No  Home free of loose throw rugs in walkways, pet beds, electrical cords, etc? Yes  Adequate lighting in your home to reduce risk of falls? Yes   ASSISTIVE DEVICES UTILIZED TO PREVENT FALLS:  Life alert? No  Use of a cane, walker or w/c? No  Grab bars in the bathroom? No  Shower chair or bench in shower? No  Elevated toilet seat or a handicapped toilet? No   TIMED UP AND GO:  Was the test performed? Yes .  Length of time to ambulate 10 feet: 13 sec.   Gait slow and steady without use of assistive device  Cognitive Function: No flowsheet data found.         Immunizations Immunization History  Administered Date(s) Administered   Influenza,inj,Quad PF,6+ Mos 02/20/2014, 02/26/2017, 09/23/2017, 09/29/2018, 03/04/2019, 03/07/2020   PPD Test 03/06/2015   Td 01/22/2004   Tdap 02/21/2014, 07/31/2017    TDAP status: Up to date  Flu Vaccine status: Up to date  Pneumococcal vaccine status: Due, Education has been provided regarding the importance of this vaccine. Advised may receive this vaccine at local pharmacy or Health Dept. Aware to provide a copy of the vaccination record if obtained from local pharmacy or Health Dept. Verbalized acceptance and understanding.  Covid-19 vaccine status: Declined, Education has been provided regarding the  importance of this vaccine but patient  still declined. Advised may receive this vaccine at local pharmacy or Health Dept.or vaccine clinic. Aware to provide a copy of the vaccination record if obtained from local pharmacy or Health Dept. Verbalized acceptance and understanding.  Qualifies for Shingles Vaccine? Yes   Zostavax completed No   Shingrix Completed?: No.    Education has been provided regarding the importance of this vaccine. Patient has been advised to call insurance company to determine out of pocket expense if they have not yet received this vaccine. Advised may also receive vaccine at local pharmacy or Health Dept. Verbalized acceptance and understanding.  Screening Tests Health Maintenance  Topic Date Due   Zoster Vaccines- Shingrix (1 of 2) Never done   INFLUENZA VACCINE  08/21/2020   COLONOSCOPY (Pts 45-76yrs Insurance coverage will need to be confirmed)  06/08/2021 (Originally 01/14/2005)   TETANUS/TDAP  08/01/2027   Hepatitis C Screening  Completed   HIV Screening  Completed   Pneumococcal Vaccine 70-58 Years old  Aged Out   HPV VACCINES  Aged Out   COVID-19 Vaccine  Discontinued    Health Maintenance  Health Maintenance Due  Topic Date Due   Zoster Vaccines- Shingrix (1 of 2) Never done   INFLUENZA VACCINE  08/21/2020     Colorectal screening: never done; will speak with PCP at next visit  Lung Cancer Screening: (Low Dose CT Chest recommended if Age 22-80 years, 30 pack-year currently smoking OR have quit w/in 15years.) does not qualify.   Lung Cancer Screening Referral: no  Additional Screening:  Hepatitis C Screening: does qualify; Completed yes  Vision Screening: Recommended annual ophthalmology exams for early detection of glaucoma and other disorders of the eye. Is the patient up to date with their annual eye exam?  No  Who is the provider or what is the name of the office in which the patient attends annual eye exams? Never done If pt is not  established with a provider, would they like to be referred to a provider to establish care? Yes .   Dental Screening: Recommended annual dental exams for proper oral hygiene  Community Resource Referral / Chronic Care Management: CRR required this visit?  Yes   CCM required this visit?  No      Plan:     I have personally reviewed and noted the following in the patient's chart:   Medical and social history Use of alcohol, tobacco or illicit drugs  Current medications and supplements including opioid prescriptions. Patient is not currently taking opioid prescriptions. Functional ability and status Nutritional status Physical activity Advanced directives List of other physicians Hospitalizations, surgeries, and ER visits in previous 12 months Vitals Screenings to include cognitive, depression, and falls Referrals and appointments  In addition, I have reviewed and discussed with patient certain preventive protocols, quality metrics, and best practice recommendations. A written personalized care plan for preventive services as well as general preventive health recommendations were provided to patient.     Mickeal Needy, LPN   05/23/8880   Nurse Notes: n/a

## 2020-09-01 ENCOUNTER — Encounter: Payer: Medicare Other | Admitting: Physical Medicine and Rehabilitation

## 2020-10-12 NOTE — Progress Notes (Signed)
Patient ID: Phillip G Bogus Jr., male   DOB: 05/08/1960, 59 y.o.   MRN: [id number]  Medical screening examination/treatment/procedure(s) were performed by non-physician practitioner and as supervising physician I was immediately available for consultation/collaboration.  I agree with above. Psalms Olarte, MD  

## 2020-10-12 NOTE — Progress Notes (Signed)
Patient ID: [name]., male   DOB: 07-Dec-1960, 60 y.o.   MRN: [id number]  Medical screening examination/treatment/procedure(s) were performed by non-physician practitioner and as supervising physician I was immediately available for consultation/collaboration.  I agree with above. Oliver Barre, MD

## 2020-10-12 NOTE — Progress Notes (Signed)
This encounter was created in error - please disregard.

## 2020-10-20 ENCOUNTER — Encounter: Payer: Medicare Other | Admitting: Physical Medicine and Rehabilitation

## 2020-10-26 ENCOUNTER — Other Ambulatory Visit: Payer: Self-pay

## 2020-10-26 ENCOUNTER — Ambulatory Visit (INDEPENDENT_AMBULATORY_CARE_PROVIDER_SITE_OTHER): Payer: Medicare Other | Admitting: Internal Medicine

## 2020-10-26 ENCOUNTER — Encounter: Payer: Self-pay | Admitting: Internal Medicine

## 2020-10-26 VITALS — BP 176/78 | HR 56 | Temp 98.5°F | Ht 70.0 in | Wt 335.0 lb

## 2020-10-26 DIAGNOSIS — L84 Corns and callosities: Secondary | ICD-10-CM

## 2020-10-26 DIAGNOSIS — I10 Essential (primary) hypertension: Secondary | ICD-10-CM

## 2020-10-26 DIAGNOSIS — R739 Hyperglycemia, unspecified: Secondary | ICD-10-CM

## 2020-10-26 DIAGNOSIS — K611 Rectal abscess: Secondary | ICD-10-CM

## 2020-10-26 MED ORDER — AMOXICILLIN-POT CLAVULANATE 875-125 MG PO TABS: 1.0000 | ORAL_TABLET | Freq: Two times a day (BID) | ORAL | 0 refills | Status: DC

## 2020-10-26 NOTE — Patient Instructions (Signed)
Please take all new medication as prescribed - the antibiotic  You will be contacted regarding the referral for: general surgury, but also podiatry for your feet  Please continue all other medications as before, and refills have been done if requested.  Please have the pharmacy call with any other refills you may need.  Please keep your appointments with your specialists as you may have planned

## 2020-10-26 NOTE — Progress Notes (Signed)
Patient ID: Phillip Ohm., male   DOB: 1960/05/18, 60 y.o.   MRN: [id number]        Chief Complaint: follow up c/o right buttock pain (not hip pain as he was embarrased to say to staff)       HPI:  Phillip Square. is a 60 y.o. male here with c/o 3 days osnet right buttock/perianal pain, tender swelling without drainage, fever, chills, trauma.  No prior hx same.  Seems to have started after some scratching.  Pt denies chest pain, increased sob or doe, wheezing, orthopnea, PND, increased LE swelling, palpitations, dizziness or syncope.   Pt denies polydipsia, polyuria, but has persistent bilateral feet pain, and was not able to see podiatry.     Wt Readings from Last 3 Encounters:  10/26/20 (!) 335 lb (152 kg)  08/31/20 (!) 329 lb 3.2 oz (149.3 kg)  07/26/20 (!) 325 lb (147.4 kg)   BP Readings from Last 3 Encounters:  10/26/20 (!) 176/78  08/31/20 130/80  07/26/20 (!) 170/74         Past Medical History:  Diagnosis Date   Allergic rhinitis    Anemia of chronic disease    Anxiety    Asthma    Autism    Bladder 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/29/2011   Depression    Gallstone    GERD (gastroesophageal reflux disease)    Hyperlipidemia    Hypertension    IBS (irritable bowel syndrome)    Obesity    OSA (obstructive sleep apnea)    Pneumonia    UTI (urinary tract infection)    Past Surgical History:  Procedure Laterality Date   ORIF ANKLE FRACTURE Left 03/02/2015   Procedure: OPEN REDUCTION INTERNAL FIXATION (ORIF) LEFT ANKLE TRIMALLEOLAR FRACTURE;  Surgeon: Toni Arthurs, MD;  Location: [facility] OR;  Service: Orthopedics;  Laterality: Left;    reports that he has never smoked. He has never used smokeless tobacco. He reports that he does not drink alcohol and does not use drugs. family history includes Alcohol abuse in an other family member; Arthritis in an other family member; Breast cancer in an other family member; Diabetes in an other family member; Heart disease in his father and another  family member; Hyperlipidemia in an other family member; Irritable bowel syndrome in his mother; Kidney disease in an other family member; Pancreatic cancer in an other family member; Prostate cancer in an other family member; Stroke in an other family member. Allergies  Allergen Reactions   Fluoxetine Hcl Other (See Comments)    ineffective   Xanax [Alprazolam] Other (See Comments)    Made patient too sleepy   Current Outpatient Medications on File Prior to Visit  Medication Sig Dispense Refill   albuterol (VENTOLIN HFA) 108 (90 Base) MCG/ACT inhaler Inhale 2 puffs into the lungs every 6 (six) hours as needed for wheezing or shortness of breath. 3 Inhaler 3   apixaban (ELIQUIS) 5 MG TABS tablet Take 1 tablet (5 mg total) by mouth 2 (two) times daily. 180 tablet 3   aspirin EC 81 MG tablet Take 81 mg by mouth daily.     atorvastatin (LIPITOR) 40 MG tablet Take 1 tablet (40 mg total) by mouth daily. 90 tablet 3   buPROPion (WELLBUTRIN XL) 300 MG 24 hr tablet Take 1 tablet (300 mg total) by mouth daily. 90 tablet 3   gabapentin (NEURONTIN) 300 MG capsule TAKE 2 CAPSULES BY MOUTH AT BEDTIME. 180 capsule 1   halobetasol (ULTRAVATE) 0.05 %  ointment Apply 1 application topically 2 (two) times daily.     hydroxypropyl methylcellulose / hypromellose (ISOPTO TEARS / GONIOVISC) 2.5 % ophthalmic solution Place 1 drop into both eyes 3 (three) times daily as needed for dry eyes.     lisinopril (ZESTRIL) 20 MG tablet Take 1 tablet (20 mg total) by mouth daily. 90 tablet 3   mupirocin ointment (BACTROBAN) 2 % Apply 1 application topically 2 (two) times daily.     nystatin (MYCOSTATIN/NYSTOP) powder Use as directed twice daily as needed for groin and scrotal rash (Patient taking differently: Apply 1 application topically 2 (two) times daily. Use as directed twice daily as needed for groin and scrotal rash) 45 g 1   omeprazole (PRILOSEC) 20 MG capsule TAKE 1 CAPSULE BY MOUTH EVERY DAY (Patient taking  differently: Take 20 mg by mouth daily.) 90 capsule 3   sertraline (ZOLOFT) 100 MG tablet TAKE 2 TABLETS BY MOUTH EVERY DAY (Patient taking differently: Take 100 mg by mouth at bedtime.) 180 tablet 3   tamsulosin (FLOMAX) 0.4 MG CAPS capsule Take 1 capsule (0.4 mg total) by mouth 2 (two) times daily. (Patient taking differently: Take 0.4 mg by mouth daily.) 180 capsule 3   traZODone (DESYREL) 50 MG tablet Take 1 tablet (50 mg total) by mouth at bedtime as needed for sleep. 90 tablet 1   triamcinolone (KENALOG) 0.1 % Apply 1 application topically 2 (two) times daily. To legs for itching 453.6 g 0   triamcinolone (NASACORT) 55 MCG/ACT AERO nasal inhaler Place 2 sprays into the nose daily. 1 Inhaler 12   furosemide (LASIX) 40 MG tablet Take 1 tablet (40 mg total) by mouth daily. 90 tablet 3   metoprolol tartrate (LOPRESSOR) 25 MG tablet Take 0.5 tablets (12.5 mg total) by mouth 2 (two) times daily. (Patient taking differently: Take 25 mg by mouth daily.) 180 tablet 3   predniSONE (DELTASONE) 10 MG tablet 3 tabs by mouth per day for 3 days,2tabs per day for 3 days,1tab per day for 3 days (Patient not taking: Reported on 10/26/2020) 18 tablet 0   No current facility-administered medications on file prior to visit.        ROS:  All others reviewed and negative.  Objective        PE:  BP (!) 176/78 (BP Location: Right Arm, Patient Position: Sitting, Cuff Size: Large)   Pulse (!) 56   Temp 98.5 F (36.9 C) (Oral)   Ht 5\' 10"  (1.778 m)   Wt (!) 335 lb (152 kg)   SpO2 95%   BMI 48.07 kg/m                 Constitutional: Pt appears in NAD               HENT: Head: NCAT.                Right Ear: External ear normal.                 Left Ear: External ear normal.                Eyes: . Pupils are equal, round, and reactive to light. Conjunctivae and EOM are normal               Nose: without d/c or deformity               Neck: Neck supple. Gross normal ROM  Cardiovascular: Normal  rate and regular rhythm.                 Pulmonary/Chest: Effort normal and breath sounds without rales or wheezing.                Abd:  Soft, NT, ND, + BS, no organomegaly;  right ischial area with 1 cm are red, tender swelling and fluctuance, small amount BRB drainage noted               Neurological: Pt is alert. At baseline orientation, motor grossly intact               Skin: Skin is warm. No rashes, no other new lesions, LE edema - trace bilater; feet with pre ulcerative callouses               Psychiatric: Pt behavior is normal without agitation   Micro: none  Cardiac tracings I have personally interpreted today:  none  Pertinent Radiological findings (summarize): none   Lab Results  Component Value Date   WBC 5.2 07/11/2020   HGB 10.7 (L) 07/11/2020   HCT 35.3 (L) 07/11/2020   PLT 205 07/11/2020   GLUCOSE 99 07/11/2020   CHOL 164 05/10/2019   TRIG 204.0 (H) 05/10/2019   HDL 50.20 05/10/2019   LDLDIRECT 77.0 05/10/2019   LDLCALC 91 06/25/2018   ALT 27 07/11/2020   AST 24 07/11/2020   NA 140 07/11/2020   K 4.3 07/11/2020   CL 103 07/11/2020   CREATININE 1.34 (H) 07/11/2020   BUN 19 07/11/2020   CO2 31 07/11/2020   TSH 1.35 05/10/2019   PSA 0.33 05/10/2019   INR 1.05 03/02/2015   HGBA1C 6.7 (H) 05/10/2019   Assessment/Plan:  Phillip Butler. is a 60 y.o. White or Caucasian [1] male with  has a past medical history of Allergic rhinitis, Anemia of chronic disease, Anxiety, Asthma, Autism, Bladder neck obstruction (05/29/2011), Depression, Gallstone, GERD (gastroesophageal reflux disease), Hyperlipidemia, Hypertension, IBS (irritable bowel syndrome), Obesity, OSA (obstructive sleep apnea), Pneumonia, and UTI (urinary tract infection).  Perirectal abscess Mild to mod, for antibx course, refer general surgury, to f/u any worsening symptoms or concerns  Essential hypertension BP Readings from Last 3 Encounters:  10/26/20 (!) 176/78  08/31/20 130/80  07/26/20 (!)  170/74   Uncontrolled, likely reactive, pt declines med change, pt to continue medical treatment lisinopril, lopressor   Hyperglycemia Lab Results  Component Value Date   HGBA1C 6.7 (H) 05/10/2019   Mild uncontrolled, pt to continue current medical treatment  - diet, declines metformin for now   Pre-ulcerative corn or callous Ok for re-referral podiatry  Followup: Return if symptoms worsen or fail to improve.  Oliver Barre, MD 10/28/2020 7:07 PM [facility] Medicine

## 2020-10-28 ENCOUNTER — Encounter: Payer: Self-pay | Admitting: Internal Medicine

## 2020-10-28 NOTE — Assessment & Plan Note (Signed)
BP Readings from Last 3 Encounters:  10/26/20 (!) 176/78  08/31/20 130/80  07/26/20 (!) 170/74   Uncontrolled, likely reactive, pt declines med change, pt to continue medical treatment lisinopril, lopressor

## 2020-10-28 NOTE — Assessment & Plan Note (Signed)
Mild to mod, for antibx course, refer general surgury, to f/u any worsening symptoms or concerns

## 2020-10-28 NOTE — Assessment & Plan Note (Signed)
Ok for re-referral podiatry

## 2020-10-28 NOTE — Assessment & Plan Note (Signed)
Lab Results  Component Value Date   HGBA1C 6.7 (H) 05/10/2019   Mild uncontrolled, pt to continue current medical treatment  - diet, declines metformin for now

## 2020-11-13 ENCOUNTER — Telehealth: Payer: Self-pay

## 2020-11-13 NOTE — Telephone Encounter (Signed)
Please advise as the pts Brother-In-Law has stated pt is in need of Oxygen Concentrator Portable as pt is on O2 at home but has no way of bringing it with him when he goes out to [place], Doctors visits, etc as this is a better way for pt to get around.

## 2020-11-13 NOTE — Telephone Encounter (Signed)
This is a common complaint, and the short answer is that the portable o2 is only available depending on which provider is providing the oxygen service.  Please contact the provider of the service, and if they send me an order, I will sign.  Otherwise most do not provide this portable oxygen.

## 2020-11-15 ENCOUNTER — Other Ambulatory Visit: Payer: Self-pay

## 2020-11-15 ENCOUNTER — Ambulatory Visit (INDEPENDENT_AMBULATORY_CARE_PROVIDER_SITE_OTHER): Payer: Medicare Other

## 2020-11-15 DIAGNOSIS — Z23 Encounter for immunization: Secondary | ICD-10-CM | POA: Diagnosis not present

## 2020-11-15 NOTE — Telephone Encounter (Signed)
Spoke with patient's brother in Social worker. He will find the provider of oxygen and give me a call back.

## 2020-11-16 NOTE — Telephone Encounter (Signed)
So [facility] is one I think that does not provide the portable o2.  Ok for the brother in law to call to request this from them, but I think that they do not do this, in my experieince with other patients.  So sending an order is not likely to be helpful.

## 2020-11-16 NOTE — Telephone Encounter (Signed)
Patient brother in law calling back in  Says the name of the 02 oxygen provider is [facility]  Would like a call back when order has been sent & completed

## 2020-11-17 NOTE — Telephone Encounter (Signed)
Patient's brother in law has been notified

## 2020-11-27 NOTE — Telephone Encounter (Signed)
Ok done to [employer] - ok for add portable oxgen  J96.11

## 2020-11-27 NOTE — Telephone Encounter (Signed)
Patient brother in law calling in  Says he spoke to rep at [facility] & they advised him that they do carry the portable oxygen machines  Advised him that provider needs to write order specifically for Portable Oxygen Concentrator 3-5lbs  Please let him know when this has been submitted

## 2020-11-28 NOTE — Telephone Encounter (Signed)
Patient's brother-in-law notified.

## 2020-12-11 ENCOUNTER — Encounter: Payer: Self-pay | Admitting: Internal Medicine

## 2020-12-11 ENCOUNTER — Other Ambulatory Visit: Payer: Self-pay

## 2020-12-11 ENCOUNTER — Ambulatory Visit (INDEPENDENT_AMBULATORY_CARE_PROVIDER_SITE_OTHER): Payer: Medicare Other | Admitting: Internal Medicine

## 2020-12-11 VITALS — BP 158/80 | HR 76 | Temp 98.3°F | Resp 18 | Ht 70.0 in | Wt 330.0 lb

## 2020-12-11 DIAGNOSIS — H538 Other visual disturbances: Secondary | ICD-10-CM | POA: Diagnosis not present

## 2020-12-11 DIAGNOSIS — I10 Essential (primary) hypertension: Secondary | ICD-10-CM | POA: Diagnosis not present

## 2020-12-11 DIAGNOSIS — Z1211 Encounter for screening for malignant neoplasm of colon: Secondary | ICD-10-CM

## 2020-12-11 DIAGNOSIS — N529 Male erectile dysfunction, unspecified: Secondary | ICD-10-CM

## 2020-12-11 DIAGNOSIS — L84 Corns and callosities: Secondary | ICD-10-CM | POA: Diagnosis not present

## 2020-12-11 DIAGNOSIS — H9193 Unspecified hearing loss, bilateral: Secondary | ICD-10-CM

## 2020-12-11 DIAGNOSIS — R739 Hyperglycemia, unspecified: Secondary | ICD-10-CM

## 2020-12-11 NOTE — Progress Notes (Signed)
Patient ID: Phillip Ohm., male   DOB: [birth year], 61 y.o.   MRN: [id number]        Chief Complaint: follow up HTN, bilateral hearing loss, feet, need eye and hearing and colon screening       HPI:  Phillip Campoy. is a 60 y.o. male here overall doing ok, but c/o persistent obesity, cant seem to lose wt.  Declines change in tx for now however including antiHTN meds as wants referrals to podiaty, audiology, optho and colonoscopy.  Has been referred to podiatry 4 times in the past 2 yrs but this time says he will go.  Does also have bilateral hearing loss ? Wax over the past 2 wks.  Has ongoing feet callouses that occasionally hurt.  Pt denies chest pain, increased sob or doe, wheezing, orthopnea, PND, increased LE swelling, palpitations, dizziness or syncope.   Pt denies polydipsia, polyuria, Declines pneumovax today. Has some occasional  blurry vision, has had no eye exam in the past yr.       Wt Readings from Last 3 Encounters:  12/11/20 (!) 330 lb (149.7 kg)  10/26/20 (!) 335 lb (152 kg)  08/31/20 (!) 329 lb 3.2 oz (149.3 kg)   BP Readings from Last 3 Encounters:  12/11/20 (!) 158/80  10/26/20 (!) 176/78  08/31/20 130/80         Past Medical History:  Diagnosis Date   Allergic rhinitis    Anemia of chronic disease    Anxiety    Asthma    Autism    Bladder 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/29/2011   Depression    Gallstone    GERD (gastroesophageal reflux disease)    Hyperlipidemia    Hypertension    IBS (irritable bowel syndrome)    Obesity    OSA (obstructive sleep apnea)    Pneumonia    UTI (urinary tract infection)    Past Surgical History:  Procedure Laterality Date   ORIF ANKLE FRACTURE Left 03/02/2015   Procedure: OPEN REDUCTION INTERNAL FIXATION (ORIF) LEFT ANKLE TRIMALLEOLAR FRACTURE;  Surgeon: Toni Arthurs, MD;  Location: [facility] OR;  Service: Orthopedics;  Laterality: Left;    reports that he has never smoked. He has never used smokeless tobacco. He reports that he does not  drink alcohol and does not use drugs. family history includes Alcohol abuse in an other family member; Arthritis in an other family member; Breast cancer in an other family member; Diabetes in an other family member; Heart disease in his father and another family member; Hyperlipidemia in an other family member; Irritable bowel syndrome in his mother; Kidney disease in an other family member; Pancreatic cancer in an other family member; Prostate cancer in an other family member; Stroke in an other family member. Allergies  Allergen Reactions   Fluoxetine Hcl Other (See Comments)    ineffective   Xanax [Alprazolam] Other (See Comments)    Made patient too sleepy   Current Outpatient Medications on File Prior to Visit  Medication Sig Dispense Refill   albuterol (VENTOLIN HFA) 108 (90 Base) MCG/ACT inhaler Inhale 2 puffs into the lungs every 6 (six) hours as needed for wheezing or shortness of breath. 3 Inhaler 3   apixaban (ELIQUIS) 5 MG TABS tablet Take 1 tablet (5 mg total) by mouth 2 (two) times daily. 180 tablet 3   aspirin EC 81 MG tablet Take 81 mg by mouth daily.     atorvastatin (LIPITOR) 40 MG tablet Take 1 tablet (40 mg total)  by mouth daily. 90 tablet 3   buPROPion (WELLBUTRIN XL) 300 MG 24 hr tablet Take 1 tablet (300 mg total) by mouth daily. 90 tablet 3   gabapentin (NEURONTIN) 300 MG capsule TAKE 2 CAPSULES BY MOUTH AT BEDTIME. 180 capsule 1   halobetasol (ULTRAVATE) 0.05 % ointment Apply 1 application topically 2 (two) times daily.     hydroxypropyl methylcellulose / hypromellose (ISOPTO TEARS / GONIOVISC) 2.5 % ophthalmic solution Place 1 drop into both eyes 3 (three) times daily as needed for dry eyes.     lisinopril (ZESTRIL) 20 MG tablet Take 1 tablet (20 mg total) by mouth daily. 90 tablet 3   mupirocin ointment (BACTROBAN) 2 % Apply 1 application topically 2 (two) times daily.     nystatin (MYCOSTATIN/NYSTOP) powder Use as directed twice daily as needed for groin and scrotal  rash (Patient taking differently: Apply 1 application topically 2 (two) times daily. Use as directed twice daily as needed for groin and scrotal rash) 45 g 1   omeprazole (PRILOSEC) 20 MG capsule TAKE 1 CAPSULE BY MOUTH EVERY DAY (Patient taking differently: Take 20 mg by mouth daily.) 90 capsule 3   predniSONE (DELTASONE) 10 MG tablet 3 tabs by mouth per day for 3 days,2tabs per day for 3 days,1tab per day for 3 days 18 tablet 0   sertraline (ZOLOFT) 100 MG tablet TAKE 2 TABLETS BY MOUTH EVERY DAY (Patient taking differently: Take 100 mg by mouth at bedtime.) 180 tablet 3   tamsulosin (FLOMAX) 0.4 MG CAPS capsule Take 1 capsule (0.4 mg total) by mouth 2 (two) times daily. (Patient taking differently: Take 0.4 mg by mouth daily.) 180 capsule 3   traZODone (DESYREL) 50 MG tablet Take 1 tablet (50 mg total) by mouth at bedtime as needed for sleep. 90 tablet 1   triamcinolone (KENALOG) 0.1 % Apply 1 application topically 2 (two) times daily. To legs for itching 453.6 g 0   triamcinolone (NASACORT) 55 MCG/ACT AERO nasal inhaler Place 2 sprays into the nose daily. 1 Inhaler 12   furosemide (LASIX) 40 MG tablet Take 1 tablet (40 mg total) by mouth daily. 90 tablet 3   metoprolol tartrate (LOPRESSOR) 25 MG tablet Take 0.5 tablets (12.5 mg total) by mouth 2 (two) times daily. (Patient taking differently: Take 25 mg by mouth daily.) 180 tablet 3   No current facility-administered medications on file prior to visit.        ROS:  All others reviewed and negative.  Objective        PE:  BP (!) 158/80 (BP Location: Left Arm, Patient Position: Sitting, Cuff Size: Large)   Pulse 76   Temp 98.3 F (36.8 C) (Oral)   Resp 18   Ht 5\' 10"  (1.778 m)   Wt (!) 330 lb (149.7 kg)   BMI 47.35 kg/m                 Constitutional: Pt appears in NAD, not ill appearing               HENT: Head: NCAT.                Right Ear: External ear normal.                 Left Ear: External ear normal.  Bilateral ear wax  irrigated cleared and hearing improved               Eyes: . Pupils are equal, round, and  reactive to light. Conjunctivae and EOM are normal               Nose: without d/c or deformity               Neck: Neck supple. Gross normal ROM               Cardiovascular: Normal rate and regular rhythm.                 Pulmonary/Chest: Effort normal and breath sounds without rales or wheezing.                Abd:  Soft, NT, ND, + BS, no organomegaly               Neurological: Pt is alert. At baseline orientation, motor grossly intact               Skin: Skin is warm. No rashes, no other new lesions, LE edema - trace pedal bilateral with bilateral feet callouses, loss of arches and multiple long nails he cannot reach               Psychiatric: Pt behavior is normal without agitation   Micro: none  Cardiac tracings I have personally interpreted today:  none  Pertinent Radiological findings (summarize): none   Lab Results  Component Value Date   WBC 5.2 07/11/2020   HGB 10.7 (L) 07/11/2020   HCT 35.3 (L) 07/11/2020   PLT 205 07/11/2020   GLUCOSE 99 07/11/2020   CHOL 164 05/10/2019   TRIG 204.0 (H) 05/10/2019   HDL 50.20 05/10/2019   LDLDIRECT 77.0 05/10/2019   LDLCALC 91 06/25/2018   ALT 27 07/11/2020   AST 24 07/11/2020   NA 140 07/11/2020   K 4.3 07/11/2020   CL 103 07/11/2020   CREATININE 1.34 (H) 07/11/2020   BUN 19 07/11/2020   CO2 31 07/11/2020   TSH 1.35 05/10/2019   PSA 0.33 05/10/2019   INR 1.05 03/02/2015   HGBA1C 6.7 (H) 05/10/2019   Assessment/Plan:  Phillip Lumley. is a 60 y.o. White or Caucasian [1] male with  has a past medical history of Allergic rhinitis, Anemia of chronic disease, Anxiety, Asthma, Autism, Bladder neck obstruction (05/29/2011), Depression, Gallstone, GERD (gastroesophageal reflux disease), Hyperlipidemia, Hypertension, IBS (irritable bowel syndrome), Obesity, OSA (obstructive sleep apnea), Pneumonia, and UTI (urinary tract infection).  Bilateral  hearing loss Improved with irrigation, but ok for audiology referral as well  Pre-ulcerative calluses D/w pt - ok for podiatry referral  Essential hypertension BP Readings from Last 3 Encounters:  12/11/20 (!) 158/80  10/26/20 (!) 176/78  08/31/20 130/80   Uncontrolled here, d/w pt and declines change in tx for now,, pt to continue medical treatment lisinopril, lopressor   Hyperglycemia Lab Results  Component Value Date   HGBA1C 6.7 (H) 05/10/2019   Mild uncontrolled, declines change in tx,, pt to continue current medical treatment  - diet and wt control efforts    Blurred vision Etiology unclear, for optho referral  Followup: Return in about 3 months (around 03/13/2021).  Oliver Barre, MD 12/13/2020 8:34 PM [facility]

## 2020-12-11 NOTE — Patient Instructions (Addendum)
Your ears were irrigated and cleared of wax today  You will be contacted regarding the referral for: urology, eye doctor, audiologist and colonoscopy  Please continue all other medications as before, and refills have been done if requested.  Please have the pharmacy call with any other refills you may need.  Please continue your efforts at being more active, low cholesterol diet, and weight control.  You are otherwise up to date with prevention measures today.  Please keep your appointments with your specialists as you may have planned  Please go to the LAB at the blood drawing area for the tests to be done  You will be contacted by phone if any changes need to be made immediately.  Otherwise, you will receive a letter about your results with an explanation, but please check with MyChart first.  Please remember to sign up for MyChart if you have not done so, as this will be important to you in the future with finding out test results, communicating by private email, and scheduling acute appointments online when needed.  Please make an Appointment to return in 3 months, or sooner if needed

## 2020-12-13 ENCOUNTER — Encounter: Payer: Self-pay | Admitting: Internal Medicine

## 2020-12-13 DIAGNOSIS — H538 Other visual disturbances: Secondary | ICD-10-CM | POA: Insufficient documentation

## 2020-12-13 NOTE — Assessment & Plan Note (Signed)
D/w pt - ok for podiatry referral

## 2020-12-13 NOTE — Assessment & Plan Note (Signed)
Improved with irrigation, but ok for audiology referral as well

## 2020-12-13 NOTE — Assessment & Plan Note (Signed)
BP Readings from Last 3 Encounters:  12/11/20 (!) 158/80  10/26/20 (!) 176/78  08/31/20 130/80   Uncontrolled here, d/w pt and declines change in tx for now,, pt to continue medical treatment lisinopril, lopressor

## 2020-12-13 NOTE — Assessment & Plan Note (Signed)
Etiology unclear, for optho referral 

## 2020-12-13 NOTE — Assessment & Plan Note (Signed)
Lab Results  Component Value Date   HGBA1C 6.7 (H) 05/10/2019   Mild uncontrolled, declines change in tx,, pt to continue current medical treatment  - diet and wt control efforts

## 2021-02-15 ENCOUNTER — Other Ambulatory Visit: Payer: Self-pay | Admitting: Internal Medicine

## 2021-02-15 NOTE — Telephone Encounter (Signed)
Please refill as per office routine med refill policy (all routine meds to be refilled for 3 mo or monthly (per pt preference) up to one year from last visit, then month to month grace period for 3 mo, then further med refills will have to be denied) ? ?

## 2021-02-28 ENCOUNTER — Other Ambulatory Visit: Payer: Self-pay

## 2021-02-28 ENCOUNTER — Ambulatory Visit (INDEPENDENT_AMBULATORY_CARE_PROVIDER_SITE_OTHER): Payer: Medicare Other | Admitting: Podiatry

## 2021-02-28 ENCOUNTER — Encounter: Payer: Self-pay | Admitting: Podiatry

## 2021-02-28 DIAGNOSIS — L600 Ingrowing nail: Secondary | ICD-10-CM | POA: Diagnosis not present

## 2021-02-28 DIAGNOSIS — L03032 Cellulitis of left toe: Secondary | ICD-10-CM | POA: Diagnosis not present

## 2021-02-28 NOTE — Patient Instructions (Signed)

## 2021-03-04 NOTE — Progress Notes (Signed)
Subjective:   Patient ID: Phillip Butler., male   DOB: 61 y.o.   MRN: [id number]   HPI Patient presents stating that he is not sure why he is here and he does present with caregiver and has nail disease thickness and appears to have a mild ingrown toenail of the left big toe.  Difficult to communicate with    ROS      Objective:  Physical Exam  Patient has fairly diminished mental capacity with nail disease thickness incurvation and slight redness around the corners but localized      Assessment:  Combination of nail disease with relative poor health with possibility for ingrown toenail     Plan:  H&P reviewed all conditions with patient and his caregiver and did the best of my ability to communicate with them concerns.  Today went ahead and cleaned out nail borders I did not note any active infection I discussed soaks and I discussed routine care.  Patient will be seen back to recheck if any changes were to occur but will continue along the same path and I did again educate them on good routine care of feet and shoes that would be most appropriate for him

## 2021-03-05 ENCOUNTER — Encounter: Payer: Self-pay | Admitting: Internal Medicine

## 2021-03-05 ENCOUNTER — Other Ambulatory Visit: Payer: Self-pay

## 2021-03-05 ENCOUNTER — Ambulatory Visit (INDEPENDENT_AMBULATORY_CARE_PROVIDER_SITE_OTHER): Payer: Medicare Other | Admitting: Internal Medicine

## 2021-03-05 VITALS — BP 162/82 | HR 77 | Temp 99.3°F | Ht 70.0 in | Wt 329.1 lb

## 2021-03-05 DIAGNOSIS — R079 Chest pain, unspecified: Secondary | ICD-10-CM

## 2021-03-05 DIAGNOSIS — R739 Hyperglycemia, unspecified: Secondary | ICD-10-CM

## 2021-03-05 DIAGNOSIS — E7800 Pure hypercholesterolemia, unspecified: Secondary | ICD-10-CM

## 2021-03-05 DIAGNOSIS — I10 Essential (primary) hypertension: Secondary | ICD-10-CM

## 2021-03-05 DIAGNOSIS — F418 Other specified anxiety disorders: Secondary | ICD-10-CM | POA: Diagnosis not present

## 2021-03-05 DIAGNOSIS — Z23 Encounter for immunization: Secondary | ICD-10-CM | POA: Diagnosis not present

## 2021-03-05 MED ORDER — ONDANSETRON HCL 4 MG PO TABS: 4.0000 mg | ORAL_TABLET | Freq: Three times a day (TID) | ORAL | 1 refills | Status: AC | PRN

## 2021-03-05 MED ORDER — NYSTATIN 100000 UNIT/GM EX POWD: CUTANEOUS | 3 refills | Status: AC

## 2021-03-05 MED ORDER — PANTOPRAZOLE SODIUM 40 MG PO TBEC: 40.0000 mg | DELAYED_RELEASE_TABLET | Freq: Every day | ORAL | 3 refills | Status: DC

## 2021-03-05 MED ORDER — SERTRALINE HCL 100 MG PO TABS: 200.0000 mg | ORAL_TABLET | Freq: Every day | ORAL | 3 refills | Status: DC

## 2021-03-05 NOTE — Assessment & Plan Note (Signed)
C/w msk pain, very low suspicion for other including cardiac, declines cxr and ecg today

## 2021-03-05 NOTE — Assessment & Plan Note (Signed)
Uncontrolled here, pt conts to decline change in tx  BP Readings from Last 3 Encounters:  03/05/21 (!) 162/82  12/11/20 (!) 158/80  10/26/20 (!) 176/78

## 2021-03-05 NOTE — Progress Notes (Signed)
Patient ID: Phillip Butler., male   DOB: October 28, 1960, 61 y.o.   MRN: [id number]

## 2021-03-05 NOTE — Patient Instructions (Addendum)
You had the flu shot today  Ok to the sertraline at 200 mg per day  Please consider seeing the Sports Medicine on the first floor for the possible tendonitis to the mid right foot  Ok to take the Zofran (odansetron) as needed for nausea, and ok to change the prilosec 20 mg to the stronger protonix 40 mg per day   Please continue all other medications as before, and refills have been done if requested - the powder  Please have the pharmacy call with any other refills you may need.  Please continue your efforts at being more active, low cholesterol diet, and weight control.  You are otherwise up to date with prevention measures today.  Please keep your appointments with your specialists as you may have planned

## 2021-03-05 NOTE — Progress Notes (Signed)
Patient ID: Phillip Ohmharles G Pullen Jr., male   DOB: 09-08-1960, 61 y.o.   MRN: [id number]        Chief Complaint: follow up chronic nausea, elevated bp, left cp, groin itching, anxiety       HPI:  Phillip OhmCharles G Gambale Jr. is a 61 y.o. male here overall doing ok, but with itchy rash to the bialteral groin areas as before, asks for refill nystatin powder.  Has 1 wk intermittent left uppper chest pain, sharp, sore to touch and move left arm and shoudler, non pleuritic and non positional, no fever, cough and Pt denies other chest pain, increased sob or doe, wheezing, orthopnea, PND, increased LE swelling, palpitations, dizziness or syncope.  Denies worsening depressive symptoms, suicidal ideation, or panic; has ongoing anxiety, asks for zoloft refill.  BP has been ok at home, tends to get markedly nervous with coming here per pt.   Pt denies polydipsia, polyuria, or new focal neuro s/s.    Due for flu shot Wt Readings from Last 3 Encounters:  03/05/21 (!) 329 lb 2 oz (149.3 kg)  12/11/20 (!) 330 lb (149.7 kg)  10/26/20 (!) 335 lb (152 kg)   BP Readings from Last 3 Encounters:  03/05/21 (!) 162/82  12/11/20 (!) 158/80  10/26/20 (!) 176/78         Past Medical History:  Diagnosis Date   Allergic rhinitis    Anemia of chronic disease    Anxiety    Asthma    Autism    Bladder 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/29/2011   Depression    Gallstone    GERD (gastroesophageal reflux disease)    Hyperlipidemia    Hypertension    IBS (irritable bowel syndrome)    Obesity    OSA (obstructive sleep apnea)    Pneumonia    UTI (urinary tract infection)    Past Surgical History:  Procedure Laterality Date   ORIF ANKLE FRACTURE Left 03/02/2015   Procedure: OPEN REDUCTION INTERNAL FIXATION (ORIF) LEFT ANKLE TRIMALLEOLAR FRACTURE;  Surgeon: Toni ArthursJohn Hewitt, MD;  Location: [facility] OR;  Service: Orthopedics;  Laterality: Left;    reports that he has never smoked. He has never used smokeless tobacco. He reports that he does not drink alcohol  and does not use drugs. family history includes Alcohol abuse in an other family member; Arthritis in an other family member; Breast cancer in an other family member; Diabetes in an other family member; Heart disease in his father and another family member; Hyperlipidemia in an other family member; Irritable bowel syndrome in his mother; Kidney disease in an other family member; Pancreatic cancer in an other family member; Prostate cancer in an other family member; Stroke in an other family member. Allergies  Allergen Reactions   Fluoxetine Hcl Other (See Comments)    ineffective   Xanax [Alprazolam] Other (See Comments)    Made patient too sleepy   Current Outpatient Medications on File Prior to Visit  Medication Sig Dispense Refill   albuterol (VENTOLIN HFA) 108 (90 Base) MCG/ACT inhaler Inhale 2 puffs into the lungs every 6 (six) hours as needed for wheezing or shortness of breath. 3 Inhaler 3   apixaban (ELIQUIS) 5 MG TABS tablet Take 1 tablet (5 mg total) by mouth 2 (two) times daily. 180 tablet 3   aspirin EC 81 MG tablet Take 81 mg by mouth daily.     atorvastatin (LIPITOR) 40 MG tablet Take 1 tablet (40 mg total) by mouth daily. 90 tablet 3  buPROPion (WELLBUTRIN XL) 300 MG 24 hr tablet Take 1 tablet (300 mg total) by mouth daily. 90 tablet 3   gabapentin (NEURONTIN) 300 MG capsule TAKE 2 CAPSULES BY MOUTH AT BEDTIME. 180 capsule 1   halobetasol (ULTRAVATE) 0.05 % ointment Apply 1 application topically 2 (two) times daily.     hydroxypropyl methylcellulose / hypromellose (ISOPTO TEARS / GONIOVISC) 2.5 % ophthalmic solution Place 1 drop into both eyes 3 (three) times daily as needed for dry eyes.     lisinopril (ZESTRIL) 20 MG tablet Take 1 tablet (20 mg total) by mouth daily. 90 tablet 3   metoprolol tartrate (LOPRESSOR) 25 MG tablet TAKE 0.5 TABLETS BY MOUTH 2 TIMES DAILY. 90 tablet 3   mupirocin ointment (BACTROBAN) 2 % Apply 1 application topically 2 (two) times daily.      predniSONE (DELTASONE) 10 MG tablet 3 tabs by mouth per day for 3 days,2tabs per day for 3 days,1tab per day for 3 days 18 tablet 0   tamsulosin (FLOMAX) 0.4 MG CAPS capsule Take 1 capsule (0.4 mg total) by mouth 2 (two) times daily. (Patient taking differently: Take 0.4 mg by mouth daily.) 180 capsule 3   traZODone (DESYREL) 50 MG tablet Take 1 tablet (50 mg total) by mouth at bedtime as needed for sleep. 90 tablet 1   triamcinolone (KENALOG) 0.1 % Apply 1 application topically 2 (two) times daily. To legs for itching 453.6 g 0   triamcinolone (NASACORT) 55 MCG/ACT AERO nasal inhaler Place 2 sprays into the nose daily. 1 Inhaler 12   furosemide (LASIX) 40 MG tablet Take 1 tablet (40 mg total) by mouth daily. 90 tablet 3   No current facility-administered medications on file prior to visit.        ROS:  All others reviewed and negative.  Objective        PE:  BP (!) 162/82    Pulse 77    Temp 99.3 F (37.4 C) (Oral)    Ht 5\' 10"  (1.778 m)    Wt (!) 329 lb 2 oz (149.3 kg)    SpO2 93%    BMI 47.22 kg/m                 Constitutional: Pt appears in NAD               HENT: Head: NCAT.                Right Ear: External ear normal.                 Left Ear: External ear normal.                Eyes: . Pupils are equal, round, and reactive to light. Conjunctivae and EOM are normal               Nose: without d/c or deformity               Neck: Neck supple. Gross normal ROM               Cardiovascular: Normal rate and regular rhythm.                 Pulmonary/Chest: Effort normal and breath sounds without rales or wheezing.                Abd:  Soft, NT, ND, + BS, no organomegaly  Neurological: Pt is alert. At baseline orientation, motor grossly intact               Skin: Skin is warm. No rashes, no other new lesions, LE edema - none               Psychiatric: Pt behavior is normal without agitation , 1-2+ nervous  Micro: none  Cardiac tracings I have personally interpreted  today:  none  Pertinent Radiological findings (summarize): none   Lab Results  Component Value Date   WBC 5.2 07/11/2020   HGB 10.7 (L) 07/11/2020   HCT 35.3 (L) 07/11/2020   PLT 205 07/11/2020   GLUCOSE 99 07/11/2020   CHOL 164 05/10/2019   TRIG 204.0 (H) 05/10/2019   HDL 50.20 05/10/2019   LDLDIRECT 77.0 05/10/2019   LDLCALC 91 06/25/2018   ALT 27 07/11/2020   AST 24 07/11/2020   NA 140 07/11/2020   K 4.3 07/11/2020   CL 103 07/11/2020   CREATININE 1.34 (H) 07/11/2020   BUN 19 07/11/2020   CO2 31 07/11/2020   TSH 1.35 05/10/2019   PSA 0.33 05/10/2019   INR 1.05 03/02/2015   HGBA1C 6.7 (H) 05/10/2019   Assessment/Plan:  Phillip Drotar. is a 61 y.o. White or Caucasian [1] male with  has a past medical history of Allergic rhinitis, Anemia of chronic disease, Anxiety, Asthma, Autism, Bladder neck obstruction (05/29/2011), Depression, Gallstone, GERD (gastroesophageal reflux disease), Hyperlipidemia, Hypertension, IBS (irritable bowel syndrome), Obesity, OSA (obstructive sleep apnea), Pneumonia, and UTI (urinary tract infection).  Depression with anxiety Overall stable, no SI or HI, for zoloft refill  Hyperglycemia Lab Results  Component Value Date   HGBA1C 6.7 (H) 05/10/2019   Stable, pt to continue current medical treatment  - diet and wt control   Hyperlipidemia Lab Results  Component Value Date   LDLCALC 91 06/25/2018   Stable, pt to continue current statin lipitor 40   Essential hypertension Uncontrolled here, pt conts to decline change in tx  BP Readings from Last 3 Encounters:  03/05/21 (!) 162/82  12/11/20 (!) 158/80  10/26/20 (!) 176/78    Chest pain C/w msk pain, very low suspicion for other including cardiac, declines cxr and ecg today  Followup: Return in about 6 months (around 09/02/2021).  Cathlean Cower, MD 03/05/2021 9:32 PM [facility]

## 2021-03-05 NOTE — Assessment & Plan Note (Signed)
Overall stable, no SI or HI, for zoloft refill

## 2021-03-05 NOTE — Assessment & Plan Note (Signed)
Lab Results  Component Value Date   LDLCALC 91 06/25/2018   Stable, pt to continue current statin lipitor 40  

## 2021-03-05 NOTE — Assessment & Plan Note (Signed)
Lab Results  Component Value Date   HGBA1C 6.7 (H) 05/10/2019   Stable, pt to continue current medical treatment  - diet and wt control

## 2021-03-14 ENCOUNTER — Ambulatory Visit: Payer: Medicare Other | Admitting: Internal Medicine

## 2021-05-03 ENCOUNTER — Other Ambulatory Visit: Payer: Self-pay | Admitting: Internal Medicine

## 2021-05-03 NOTE — Telephone Encounter (Signed)
Please refill as per office routine med refill policy (all routine meds to be refilled for 3 mo or monthly (per pt preference) up to one year from last visit, then month to month grace period for 3 mo, then further med refills will have to be denied) ? ?

## 2021-06-19 ENCOUNTER — Encounter: Payer: Self-pay | Admitting: Internal Medicine

## 2021-06-19 ENCOUNTER — Ambulatory Visit (INDEPENDENT_AMBULATORY_CARE_PROVIDER_SITE_OTHER): Payer: Medicare Other | Admitting: Internal Medicine

## 2021-06-19 VITALS — BP 132/84 | HR 60 | Temp 98.4°F | Ht 70.0 in | Wt 300.0 lb

## 2021-06-19 DIAGNOSIS — G2581 Restless legs syndrome: Secondary | ICD-10-CM

## 2021-06-19 DIAGNOSIS — M25511 Pain in right shoulder: Secondary | ICD-10-CM | POA: Diagnosis not present

## 2021-06-19 DIAGNOSIS — F5101 Primary insomnia: Secondary | ICD-10-CM

## 2021-06-19 DIAGNOSIS — M79672 Pain in left foot: Secondary | ICD-10-CM

## 2021-06-19 DIAGNOSIS — F418 Other specified anxiety disorders: Secondary | ICD-10-CM | POA: Diagnosis not present

## 2021-06-19 DIAGNOSIS — M25512 Pain in left shoulder: Secondary | ICD-10-CM | POA: Diagnosis not present

## 2021-06-19 DIAGNOSIS — G4700 Insomnia, unspecified: Secondary | ICD-10-CM | POA: Insufficient documentation

## 2021-06-19 MED ORDER — GABAPENTIN 300 MG PO CAPS: 600.0000 mg | ORAL_CAPSULE | Freq: Every day | ORAL | 1 refills | Status: DC

## 2021-06-19 MED ORDER — TRIAMCINOLONE ACETONIDE 0.1 % EX CREA: 1.0000 "application " | TOPICAL_CREAM | Freq: Two times a day (BID) | CUTANEOUS | 0 refills | Status: AC

## 2021-06-19 NOTE — Assessment & Plan Note (Signed)
Uncontrolled likely due to running out of gabapentin - for med restart

## 2021-06-19 NOTE — Assessment & Plan Note (Signed)
Etiology unclear, mild, tylenol prn, for sport med for possible cortisone

## 2021-06-19 NOTE — Assessment & Plan Note (Signed)
?   Plantar fasciits - for referral sport medicine

## 2021-06-19 NOTE — Progress Notes (Signed)
Patient ID: Phillip Ohm., male   DOB: [birth year], 61 y.o.   MRN: [id number]        Chief Complaint: follow up several concerns       HPI:  Phillip Thrush. is a 61 y.o. male here with persistent difficulty getting to sleep despite gabapentin (though has run out of that as well and need restart for RLS) and trazodone 50; also c/o persistent left mid foot soreness and firm area, also has increased bilateral shoulder pains worse to  lie on shoulders at night, but has FROM, ongoing for > 1 mo, mild to mod,intermittent, nothing else makes better or worse.  Denies worsening depressive symptoms, suicidal ideation, or panic; has ongoing anxiety, increased recently despite good med compliance, not currently followed by psychiatry.  Pt denies chest pain, increased sob or doe, wheezing, orthopnea, PND, increased LE swelling, palpitations, dizziness or syncope.   Pt denies polydipsia, polyuria, or new focal neuro s/s.          Wt Readings from Last 3 Encounters:  06/19/21 300 lb (136.1 kg)  03/05/21 (!) 329 lb 2 oz (149.3 kg)  12/11/20 (!) 330 lb (149.7 kg)   BP Readings from Last 3 Encounters:  06/19/21 132/84  03/05/21 (!) 162/82  12/11/20 (!) 158/80         Past Medical History:  Diagnosis Date   Allergic rhinitis    Anemia of chronic disease    Anxiety    Asthma    Autism    Bladder 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/29/2011   Depression    Gallstone    GERD (gastroesophageal reflux disease)    Hyperlipidemia    Hypertension    IBS (irritable bowel syndrome)    Obesity    OSA (obstructive sleep apnea)    Pneumonia    UTI (urinary tract infection)    Past Surgical History:  Procedure Laterality Date   ORIF ANKLE FRACTURE Left 03/02/2015   Procedure: OPEN REDUCTION INTERNAL FIXATION (ORIF) LEFT ANKLE TRIMALLEOLAR FRACTURE;  Surgeon: Toni Arthurs, MD;  Location: [facility] OR;  Service: Orthopedics;  Laterality: Left;    reports that he has never smoked. He has never used smokeless tobacco. He reports  that he does not drink alcohol and does not use drugs. family history includes Alcohol abuse in an other family member; Arthritis in an other family member; Breast cancer in an other family member; Diabetes in an other family member; Heart disease in his father and another family member; Hyperlipidemia in an other family member; Irritable bowel syndrome in his mother; Kidney disease in an other family member; Pancreatic cancer in an other family member; Prostate cancer in an other family member; Stroke in an other family member. Allergies  Allergen Reactions   Fluoxetine Hcl Other (See Comments)    ineffective   Xanax [Alprazolam] Other (See Comments)    Made patient too sleepy   Current Outpatient Medications on File Prior to Visit  Medication Sig Dispense Refill   albuterol (VENTOLIN HFA) 108 (90 Base) MCG/ACT inhaler Inhale 2 puffs into the lungs every 6 (six) hours as needed for wheezing or shortness of breath. 3 Inhaler 3   apixaban (ELIQUIS) 5 MG TABS tablet Take 1 tablet (5 mg total) by mouth 2 (two) times daily. 180 tablet 3   aspirin EC 81 MG tablet Take 81 mg by mouth daily.     atorvastatin (LIPITOR) 40 MG tablet Take 1 tablet (40 mg total) by mouth daily. 90 tablet 3  buPROPion (WELLBUTRIN XL) 300 MG 24 hr tablet Take 1 tablet (300 mg total) by mouth daily. 90 tablet 3   halobetasol (ULTRAVATE) 0.05 % ointment Apply 1 application topically 2 (two) times daily.     hydroxypropyl methylcellulose / hypromellose (ISOPTO TEARS / GONIOVISC) 2.5 % ophthalmic solution Place 1 drop into both eyes 3 (three) times daily as needed for dry eyes.     lisinopril (ZESTRIL) 20 MG tablet TAKE 1 TABLET BY MOUTH EVERY DAY 90 tablet 3   metoprolol tartrate (LOPRESSOR) 25 MG tablet TAKE 0.5 TABLETS BY MOUTH 2 TIMES DAILY. 90 tablet 3   mupirocin ointment (BACTROBAN) 2 % Apply 1 application topically 2 (two) times daily.     nystatin (MYCOSTATIN/NYSTOP) powder Use as directed twice daily as needed 45 g 3    ondansetron (ZOFRAN) 4 MG tablet Take 1 tablet (4 mg total) by mouth every 8 (eight) hours as needed for nausea or vomiting. 40 tablet 1   pantoprazole (PROTONIX) 40 MG tablet Take 1 tablet (40 mg total) by mouth daily. 90 tablet 3   predniSONE (DELTASONE) 10 MG tablet 3 tabs by mouth per day for 3 days,2tabs per day for 3 days,1tab per day for 3 days 18 tablet 0   sertraline (ZOLOFT) 100 MG tablet Take 2 tablets (200 mg total) by mouth daily. 180 tablet 3   tamsulosin (FLOMAX) 0.4 MG CAPS capsule Take 1 capsule (0.4 mg total) by mouth 2 (two) times daily. (Patient taking differently: Take 0.4 mg by mouth daily.) 180 capsule 3   traZODone (DESYREL) 50 MG tablet Take 1 tablet (50 mg total) by mouth at bedtime as needed for sleep. 90 tablet 1   triamcinolone (NASACORT) 55 MCG/ACT AERO nasal inhaler Place 2 sprays into the nose daily. 1 Inhaler 12   furosemide (LASIX) 40 MG tablet Take 1 tablet (40 mg total) by mouth daily. 90 tablet 3   No current facility-administered medications on file prior to visit.        ROS:  All others reviewed and negative.  Objective        PE:  BP 132/84 (BP Location: Right Arm, Patient Position: Sitting, Cuff Size: Large)   Pulse 60   Temp 98.4 F (36.9 C) (Oral)   Ht  (1.778 m)   Wt 300 lb (136.1 kg)   SpO2 95%   BMI 43.05 kg/m                 Constitutional: Pt appears in NAD               HENT: Head: NCAT.                Right Ear: External ear normal.                 Left Ear: External ear normal.                Eyes: . Pupils are equal, round, and reactive to light. Conjunctivae and EOM are normal               Nose: without d/c or deformity               Neck: Neck supple. Gross normal ROM               Cardiovascular: Normal rate and regular rhythm.                 Pulmonary/Chest: Effort normal and breath sounds without  rales or wheezing. ; bilat shoulders with diffuse tender but FROM, no swelling; left plantar medial midfoot with tender  sore firm area                              Neurological: Pt is alert. At baseline orientation, motor grossly intact               Skin: Skin is warm. No rashes, no other new lesions, LE edema - none               Psychiatric: Pt behavior is normal without agitation , mod nervous  Micro: none  Cardiac tracings I have personally interpreted today:  none  Pertinent Radiological findings (summarize): none   Lab Results  Component Value Date   WBC 5.2 07/11/2020   HGB 10.7 (L) 07/11/2020   HCT 35.3 (L) 07/11/2020   PLT 205 07/11/2020   GLUCOSE 99 07/11/2020   CHOL 164 05/10/2019   TRIG 204.0 (H) 05/10/2019   HDL 50.20 05/10/2019   LDLDIRECT 77.0 05/10/2019   LDLCALC 91 06/25/2018   ALT 27 07/11/2020   AST 24 07/11/2020   NA 140 07/11/2020   K 4.3 07/11/2020   CL 103 07/11/2020   CREATININE 1.34 (H) 07/11/2020   BUN 19 07/11/2020   CO2 31 07/11/2020   TSH 1.35 05/10/2019   PSA 0.33 05/10/2019   INR 1.05 03/02/2015   HGBA1C 6.7 (H) 05/10/2019   Assessment/Plan:  Phillip Wimmer. is a 61 y.o. White or Caucasian [1] male with  has a past medical history of Allergic rhinitis, Anemia of chronic disease, Anxiety, Asthma, Autism, Bladder neck obstruction (05/29/2011), Depression, Gallstone, GERD (gastroesophageal reflux disease), Hyperlipidemia, Hypertension, IBS (irritable bowel syndrome), Obesity, OSA (obstructive sleep apnea), Pneumonia, and UTI (urinary tract infection).  Depression with anxiety Worsening recently despite good med compliance, no SI or HI, now for psychiatry referral  Left foot pain ? Plantar fasciits - for referral sport medicine  Bilateral shoulder pain Etiology unclear, mild, tylenol prn, for sport med for possible cortisone  RLS (restless legs syndrome) Uncontrolled likely due to running out of gabapentin - for med restart  Insomnia Despite gabapentin, ok for increased trazodone 100 qhs  Followup: Return in about 4 months (around  10/20/2021).  Oliver Barre, MD 06/19/2021 7:39 PM [facility]

## 2021-06-19 NOTE — Assessment & Plan Note (Signed)
Worsening recently despite good med compliance, no SI or HI, now for psychiatry referral

## 2021-06-19 NOTE — Assessment & Plan Note (Signed)
Despite gabapentin, ok for increased trazodone 100 qhs

## 2021-06-19 NOTE — Patient Instructions (Signed)
Ok to increase the trazodone to 100 mg at bedtime for sleep  Ok to restart the gabapentin at bedtime for RLS as well  Please continue all other medications as before, and refills have been done if requested - the leg cream  Please have the pharmacy call with any other refills you may need.  Please continue your efforts at being more active, low cholesterol diet, and weight control.  Please keep your appointments with your specialists as you may have planned  You will be contacted regarding the referral for: psychiatry, as well as sports medicine for the left foot and the shoulders  Please make an Appointment to return in 4 months, or sooner if needed

## 2021-06-21 ENCOUNTER — Encounter: Payer: Self-pay | Admitting: Family Medicine

## 2021-07-09 ENCOUNTER — Ambulatory Visit: Payer: Medicare Other | Admitting: Internal Medicine

## 2021-07-17 ENCOUNTER — Ambulatory Visit: Payer: Medicare Other | Admitting: Internal Medicine

## 2021-07-26 ENCOUNTER — Ambulatory Visit: Payer: Medicare Other | Admitting: Internal Medicine

## 2021-08-08 ENCOUNTER — Ambulatory Visit: Payer: Medicare Other | Admitting: Internal Medicine

## 2021-08-13 ENCOUNTER — Encounter: Payer: Self-pay | Admitting: Internal Medicine

## 2021-08-13 ENCOUNTER — Ambulatory Visit (INDEPENDENT_AMBULATORY_CARE_PROVIDER_SITE_OTHER): Payer: Medicare Other | Admitting: Internal Medicine

## 2021-08-13 VITALS — BP 150/82 | HR 69 | Temp 99.5°F | Ht 70.0 in | Wt 295.2 lb

## 2021-08-13 DIAGNOSIS — L84 Corns and callosities: Secondary | ICD-10-CM | POA: Diagnosis not present

## 2021-08-13 DIAGNOSIS — M79672 Pain in left foot: Secondary | ICD-10-CM

## 2021-08-13 DIAGNOSIS — I10 Essential (primary) hypertension: Secondary | ICD-10-CM

## 2021-08-13 DIAGNOSIS — L609 Nail disorder, unspecified: Secondary | ICD-10-CM

## 2021-08-13 DIAGNOSIS — F5101 Primary insomnia: Secondary | ICD-10-CM

## 2021-08-13 MED ORDER — TRAZODONE HCL 100 MG PO TABS: 100.0000 mg | ORAL_TABLET | Freq: Every evening | ORAL | 1 refills | Status: DC | PRN

## 2021-08-13 MED ORDER — METOPROLOL TARTRATE 50 MG PO TABS: 50.0000 mg | ORAL_TABLET | Freq: Two times a day (BID) | ORAL | 3 refills | Status: DC

## 2021-08-13 NOTE — Assessment & Plan Note (Signed)
BP Readings from Last 3 Encounters:  08/13/21 (!) 150/82  06/19/21 132/84  03/05/21 (!) 162/82   uncontrolled, pt to increase metoprolol 50 bid

## 2021-08-13 NOTE — Assessment & Plan Note (Signed)
Uncontrolled, ok for increased trazodone 100 mg qhs prn

## 2021-08-13 NOTE — Patient Instructions (Addendum)
Ok to increase the metoprolol to 50 mg twice per day for the blood pressure AND the tremors  Ok to increase the trazodone to 100 mg at bedtime as needed for sleep  Please continue all other medications as before, and refills have been done if requested.  Please have the pharmacy call with any other refills you may need.  Please keep your appointments with your specialists as you may have planned  You will be contacted regarding the referral for: podiatry for the foot pain AND the nails  Please make an Appointment to return in 4 months, or sooner if needed

## 2021-08-13 NOTE — Assessment & Plan Note (Signed)
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

## 2021-08-13 NOTE — Progress Notes (Signed)
Patient ID: Phillip Ohm., male   DOB: 21-Jun-1960, 61 y.o.   MRN: [id number]        Chief Complaint: follow up HTN, tremor, toenail disorder, insomnia       HPI:  Phillip Clasby. is a 61 y.o. male here overall doing ok, but has worsening long nails, worsening sleep difficulty in the past month, worsening tremors to both hands and Pt denies chest pain, increased sob or doe, wheezing, orthopnea, PND, increased LE swelling, palpitations, dizziness or syncope.   Pt denies polydipsia, polyuria, or new focal neuro s/s.     Wt Readings from Last 3 Encounters:  08/13/21 295 lb 4 oz (133.9 kg)  06/19/21 300 lb (136.1 kg)  03/05/21 (!) 329 lb 2 oz (149.3 kg)   BP Readings from Last 3 Encounters:  08/13/21 (!) 150/82  06/19/21 132/84  03/05/21 (!) 162/82         Past Medical History:  Diagnosis Date   Allergic rhinitis    Anemia of chronic disease    Anxiety    Asthma    Autism    Bladder 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/29/2011   Depression    Gallstone    GERD (gastroesophageal reflux disease)    Hyperlipidemia    Hypertension    IBS (irritable bowel syndrome)    Obesity    OSA (obstructive sleep apnea)    Pneumonia    UTI (urinary tract infection)    Past Surgical History:  Procedure Laterality Date   ORIF ANKLE FRACTURE Left 03/02/2015   Procedure: OPEN REDUCTION INTERNAL FIXATION (ORIF) LEFT ANKLE TRIMALLEOLAR FRACTURE;  Surgeon: Toni Arthurs, MD;  Location: [facility] OR;  Service: Orthopedics;  Laterality: Left;    reports that he has never smoked. He has never used smokeless tobacco. He reports that he does not drink alcohol and does not use drugs. family history includes Alcohol abuse in an other family member; Arthritis in an other family member; Breast cancer in an other family member; Diabetes in an other family member; Heart disease in his father and another family member; Hyperlipidemia in an other family member; Irritable bowel syndrome in his mother; Kidney disease in an other family  member; Pancreatic cancer in an other family member; Prostate cancer in an other family member; Stroke in an other family member. Allergies  Allergen Reactions   Fluoxetine Hcl Other (See Comments)    ineffective   Xanax [Alprazolam] Other (See Comments)    Made patient too sleepy   Current Outpatient Medications on File Prior to Visit  Medication Sig Dispense Refill   albuterol (VENTOLIN HFA) 108 (90 Base) MCG/ACT inhaler Inhale 2 puffs into the lungs every 6 (six) hours as needed for wheezing or shortness of breath. 3 Inhaler 3   apixaban (ELIQUIS) 5 MG TABS tablet Take 1 tablet (5 mg total) by mouth 2 (two) times daily. 180 tablet 3   aspirin EC 81 MG tablet Take 81 mg by mouth daily.     atorvastatin (LIPITOR) 40 MG tablet Take 1 tablet (40 mg total) by mouth daily. 90 tablet 3   buPROPion (WELLBUTRIN XL) 300 MG 24 hr tablet Take 1 tablet (300 mg total) by mouth daily. 90 tablet 3   gabapentin (NEURONTIN) 300 MG capsule Take 2 capsules (600 mg total) by mouth at bedtime. 180 capsule 1   halobetasol (ULTRAVATE) 0.05 % ointment Apply 1 application topically 2 (two) times daily.     hydroxypropyl methylcellulose / hypromellose (ISOPTO TEARS / GONIOVISC) 2.5 %  ophthalmic solution Place 1 drop into both eyes 3 (three) times daily as needed for dry eyes.     lisinopril (ZESTRIL) 20 MG tablet TAKE 1 TABLET BY MOUTH EVERY DAY 90 tablet 3   mupirocin ointment (BACTROBAN) 2 % Apply 1 application topically 2 (two) times daily.     nystatin (MYCOSTATIN/NYSTOP) powder Use as directed twice daily as needed 45 g 3   ondansetron (ZOFRAN) 4 MG tablet Take 1 tablet (4 mg total) by mouth every 8 (eight) hours as needed for nausea or vomiting. 40 tablet 1   pantoprazole (PROTONIX) 40 MG tablet Take 1 tablet (40 mg total) by mouth daily. 90 tablet 3   predniSONE (DELTASONE) 10 MG tablet 3 tabs by mouth per day for 3 days,2tabs per day for 3 days,1tab per day for 3 days 18 tablet 0   sertraline (ZOLOFT) 100 MG  tablet Take 2 tablets (200 mg total) by mouth daily. 180 tablet 3   tamsulosin (FLOMAX) 0.4 MG CAPS capsule Take 1 capsule (0.4 mg total) by mouth 2 (two) times daily. (Patient taking differently: Take 0.4 mg by mouth daily.) 180 capsule 3   triamcinolone (NASACORT) 55 MCG/ACT AERO nasal inhaler Place 2 sprays into the nose daily. 1 Inhaler 12   triamcinolone cream (KENALOG) 0.1 % Apply 1 application. topically 2 (two) times daily. To legs for itching 453.6 g 0   clobetasol cream (TEMOVATE) 0.05 % Apply 1 Application topically 2 (two) times daily.     furosemide (LASIX) 40 MG tablet Take 1 tablet (40 mg total) by mouth daily. 90 tablet 3   No current facility-administered medications on file prior to visit.        ROS:  All others reviewed and negative.  Objective        PE:  BP (!) 150/82   Pulse 69   Temp 99.5 F (37.5 C) (Oral)   Ht 5\' 10"  (1.778 m)   Wt 295 lb 4 oz (133.9 kg)   SpO2 95%   BMI 42.36 kg/m                 Constitutional: Pt appears in NAD               HENT: Head: NCAT.                Right Ear: External ear normal.                 Left Ear: External ear normal.                Eyes: . Pupils are equal, round, and reactive to light. Conjunctivae and EOM are normal               Nose: without d/c or deformity               Neck: Neck supple. Gross normal ROM               Cardiovascular: Normal rate and regular rhythm.                 Pulmonary/Chest: Effort normal and breath sounds without rales or wheezing.                Abd:  Soft, NT, ND, + BS, no organomegaly               Neurological: Pt is alert. At baseline orientation, motor grossly intact  Skin: Skin is warm. No rashes, no other new lesions, LE edema - none               Psychiatric: Pt behavior is normal without agitation   Micro: none  Cardiac tracings I have personally interpreted today:  none  Pertinent Radiological findings (summarize): none   Lab Results  Component Value Date    WBC 5.2 07/11/2020   HGB 10.7 (L) 07/11/2020   HCT 35.3 (L) 07/11/2020   PLT 205 07/11/2020   GLUCOSE 99 07/11/2020   CHOL 164 05/10/2019   TRIG 204.0 (H) 05/10/2019   HDL 50.20 05/10/2019   LDLDIRECT 77.0 05/10/2019   LDLCALC 91 06/25/2018   ALT 27 07/11/2020   AST 24 07/11/2020   NA 140 07/11/2020   K 4.3 07/11/2020   CL 103 07/11/2020   CREATININE 1.34 (H) 07/11/2020   BUN 19 07/11/2020   CO2 31 07/11/2020   TSH 1.35 05/10/2019   PSA 0.33 05/10/2019   INR 1.05 03/02/2015   HGBA1C 6.7 (H) 05/10/2019   Assessment/Plan:  Phillip Marcin. is a 61 y.o. White or Caucasian [1] male with  has a past medical history of Allergic rhinitis, Anemia of chronic disease, Anxiety, Asthma, Autism, Bladder neck obstruction (05/29/2011), Depression, Gallstone, GERD (gastroesophageal reflux disease), Hyperlipidemia, Hypertension, IBS (irritable bowel syndrome), Obesity, OSA (obstructive sleep apnea), Pneumonia, and UTI (urinary tract infection).  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  Oliver Barre, MD 08/13/2021 8:41 PM [facility] [place] [facility]

## 2021-08-13 NOTE — Assessment & Plan Note (Signed)
Also for podaitry referral for ongoing management

## 2021-08-13 NOTE — Assessment & Plan Note (Signed)
Also for podiatry referral 

## 2021-09-03 ENCOUNTER — Telehealth: Payer: Self-pay | Admitting: Internal Medicine

## 2021-09-03 NOTE — Telephone Encounter (Signed)
Left message for patient to call back to schedule Medicare Annual Wellness Visit   Last AWV  08/31/20  Please schedule at anytime with [facility] Advisor if patient calls the office back.    Any questions, please call me at [phone]

## 2021-09-14 ENCOUNTER — Ambulatory Visit: Payer: Medicare Other | Admitting: Podiatry

## 2021-09-24 ENCOUNTER — Other Ambulatory Visit: Payer: Self-pay | Admitting: Internal Medicine

## 2021-10-11 ENCOUNTER — Ambulatory Visit (INDEPENDENT_AMBULATORY_CARE_PROVIDER_SITE_OTHER): Payer: Medicare Other | Admitting: Podiatry

## 2021-10-11 ENCOUNTER — Encounter: Payer: Self-pay | Admitting: Podiatry

## 2021-10-11 DIAGNOSIS — M79675 Pain in left toe(s): Secondary | ICD-10-CM | POA: Diagnosis not present

## 2021-10-11 DIAGNOSIS — M79674 Pain in right toe(s): Secondary | ICD-10-CM | POA: Diagnosis not present

## 2021-10-11 DIAGNOSIS — L853 Xerosis cutis: Secondary | ICD-10-CM | POA: Diagnosis not present

## 2021-10-11 DIAGNOSIS — B351 Tinea unguium: Secondary | ICD-10-CM

## 2021-10-11 MED ORDER — AMMONIUM LACTATE 12 % EX LOTN: 1.0000 | TOPICAL_LOTION | CUTANEOUS | 0 refills | Status: AC | PRN

## 2021-10-11 NOTE — Progress Notes (Signed)
  Subjective:  Patient ID: Phillip Butler., male    DOB: 1960-11-18,  MRN: [id number]  Chief Complaint  Patient presents with   Nail Problem    Nail trim   61 y.o. male returns for the above complaint.  Patient presents with thickened elongated dystrophic toenails x10 mild pain on palpation hurts with ambulation hurts with pressure he would like to have it debrided down.  He is not able to do it himself.  He also has secondary complaint of dry skin to both monitor foot he would like to have that treated and discuss options.  He has failed over-the-counter lotions  Objective:  There were no vitals filed for this visit. Podiatric Exam: Vascular: dorsalis pedis and posterior tibial pulses are palpable bilateral. Capillary return is immediate. Temperature gradient is WNL. Skin turgor WNL  Sensorium: Normal Semmes Weinstein monofilament test. Normal tactile sensation bilaterally. Nail Exam: Pt has thick disfigured discolored nails with subungual debris noted bilateral entire nail hallux through fifth toenails.  Pain on palpation to the nails. Ulcer Exam: There is no evidence of ulcer or pre-ulcerative changes or infection. Orthopedic Exam: Muscle tone and strength are WNL. No limitations in general ROM. No crepitus or effusions noted.  Skin: No Porokeratosis. No infection or ulcers.  Xerosis noted to bilateral plantar foot no severe fissures or breakdown of skin noted.  No itching noted.    Assessment & Plan:   1. Pain due to onychomycosis of toenails of both feet   2. Xerosis of skin     Patient was evaluated and treated and all questions answered.  Bilateral skin xerosis -I explained to the patient the etiology of xerosis and various treatment options were extensively discussed.  I explained to the patient the importance of maintaining moisturization of the skin with application of over-the-counter lotion such as Eucerin or Luciderm.  Patient has failed over-the-counter medication she  would benefit from ammonium lactate ammonium lactate was sent to the pharmacy   Onychomycosis with pain  -Nails palliatively debrided as below. -Educated on self-care  Procedure: Nail Debridement Rationale: pain  Type of Debridement: manual, sharp debridement. Instrumentation: Nail nipper, rotary burr. Number of Nails: 10  Procedures and Treatment: Consent by patient was obtained for treatment procedures. The patient understood the discussion of treatment and procedures well. All questions were answered thoroughly reviewed. Debridement of mycotic and hypertrophic toenails, 1 through 5 bilateral and clearing of subungual debris. No ulceration, no infection noted.  Return Visit-Office Procedure: Patient instructed to return to the office for a follow up visit 3 months for continued evaluation and treatment.  Boneta Lucks, DPM    Return in about 3 months (around 01/10/2022) for MAyer.

## 2021-10-16 ENCOUNTER — Ambulatory Visit: Payer: Medicare Other | Admitting: Internal Medicine

## 2021-12-03 IMAGING — DX DG CHEST 1V PORT
1 series · 1 of 1 positions shown · non-contrast
Comparison: 02/13/2019

CLINICAL DATA: Short of breath.

EXAM:
PORTABLE CHEST 1 VIEW

[chest ap]
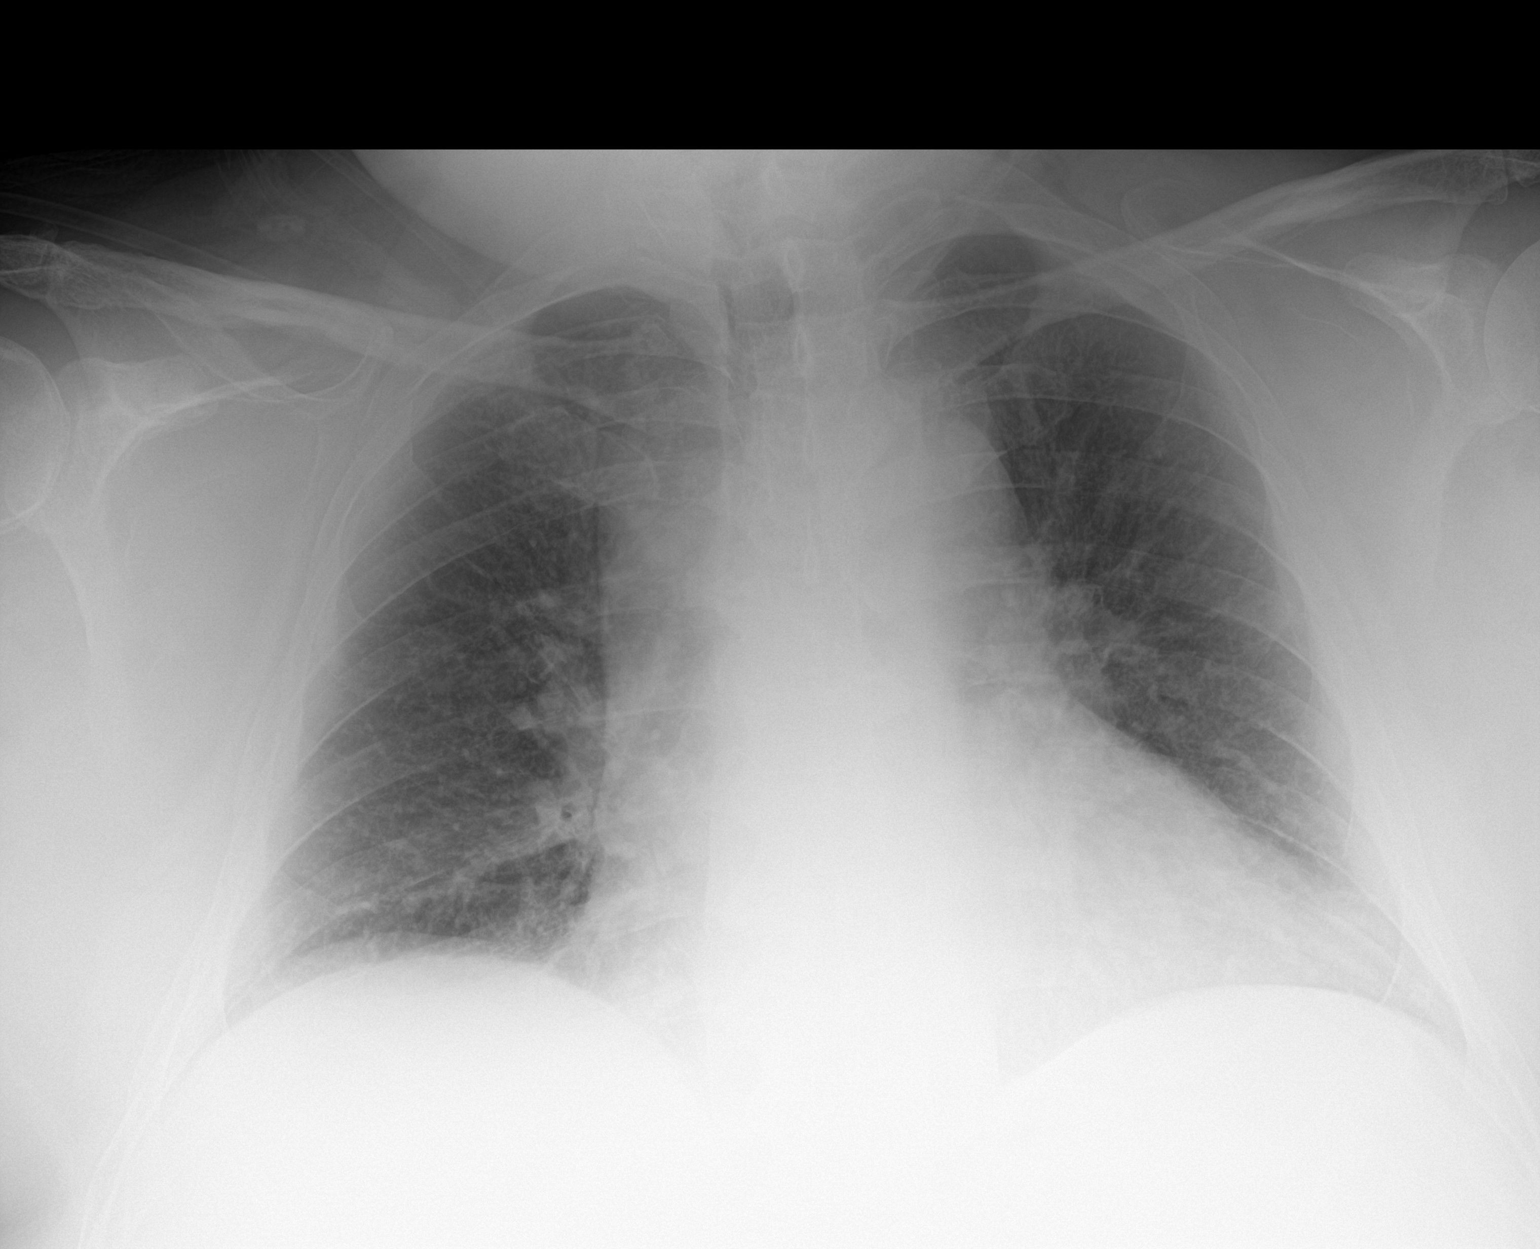

[1 of 1 positions shown; findings below may reference images not displayed]

FINDINGS: Heart size and mediastinal contours are unremarkable. No pleural
effusion or edema. Diffuse decreased lung volumes. No airspace
consolidation.
IMPRESSION: Low lung volumes.  No acute findings.

## 2021-12-14 ENCOUNTER — Ambulatory Visit: Payer: Medicare Other | Admitting: Internal Medicine

## 2021-12-17 ENCOUNTER — Ambulatory Visit: Payer: Medicare Other | Admitting: Internal Medicine

## 2021-12-20 ENCOUNTER — Ambulatory Visit: Payer: Medicare Other | Admitting: Internal Medicine

## 2021-12-25 ENCOUNTER — Encounter: Payer: Self-pay | Admitting: Podiatry

## 2022-01-03 ENCOUNTER — Encounter: Payer: Self-pay | Admitting: Internal Medicine

## 2022-01-03 ENCOUNTER — Ambulatory Visit (INDEPENDENT_AMBULATORY_CARE_PROVIDER_SITE_OTHER): Payer: Medicare Other | Admitting: Internal Medicine

## 2022-01-03 VITALS — BP 136/72 | HR 122 | Temp 98.4°F | Ht 70.0 in | Wt 285.0 lb

## 2022-01-03 DIAGNOSIS — E559 Vitamin D deficiency, unspecified: Secondary | ICD-10-CM

## 2022-01-03 DIAGNOSIS — R350 Frequency of micturition: Secondary | ICD-10-CM

## 2022-01-03 DIAGNOSIS — R739 Hyperglycemia, unspecified: Secondary | ICD-10-CM

## 2022-01-03 DIAGNOSIS — N320 Bladder-neck obstruction: Secondary | ICD-10-CM

## 2022-01-03 DIAGNOSIS — I10 Essential (primary) hypertension: Secondary | ICD-10-CM

## 2022-01-03 DIAGNOSIS — E538 Deficiency of other specified B group vitamins: Secondary | ICD-10-CM | POA: Diagnosis not present

## 2022-01-03 DIAGNOSIS — R251 Tremor, unspecified: Secondary | ICD-10-CM | POA: Diagnosis not present

## 2022-01-03 DIAGNOSIS — E7849 Other hyperlipidemia: Secondary | ICD-10-CM | POA: Diagnosis not present

## 2022-01-03 DIAGNOSIS — Z23 Encounter for immunization: Secondary | ICD-10-CM | POA: Diagnosis not present

## 2022-01-03 LAB — BASIC METABOLIC PANEL
BUN: 30 mg/dL — ABNORMAL HIGH (ref 6–23)
CO2: 29 mEq/L (ref 19–32)
Calcium: 8.7 mg/dL (ref 8.4–10.5)
Chloride: 109 mEq/L (ref 96–112)
Creatinine, Ser: 1.89 mg/dL — ABNORMAL HIGH (ref 0.40–1.50)
GFR: 37.72 mL/min — ABNORMAL LOW (ref >60.00)
Glucose, Bld: 95 mg/dL (ref 70–99)
Potassium: 5 mEq/L (ref 3.5–5.1)
Sodium: 141 mEq/L (ref 135–145)

## 2022-01-03 LAB — CBC WITH DIFFERENTIAL/PLATELET
Basophils Absolute: 0 10*3/uL (ref 0.0–0.1)
Basophils Relative: 0.5 % (ref 0.0–3.0)
Eosinophils Absolute: 0.3 10*3/uL (ref 0.0–0.7)
Eosinophils Relative: 5.4 % — ABNORMAL HIGH (ref 0.0–5.0)
HCT: 30.5 % — ABNORMAL LOW (ref 39.0–52.0)
Hemoglobin: 10.1 g/dL — ABNORMAL LOW (ref 13.0–17.0)
Lymphocytes Relative: 24 % (ref 12.0–46.0)
Lymphs Abs: 1.4 10*3/uL (ref 0.7–4.0)
MCHC: 33.2 g/dL (ref 30.0–36.0)
MCV: 84.9 fl (ref 78.0–100.0)
Monocytes Absolute: 0.7 10*3/uL (ref 0.1–1.0)
Monocytes Relative: 11.6 % (ref 3.0–12.0)
Neutro Abs: 3.3 10*3/uL (ref 1.4–7.7)
Neutrophils Relative %: 58.5 % (ref 43.0–77.0)
Platelets: 229 10*3/uL (ref 150.0–400.0)
RBC: 3.6 Mil/uL — ABNORMAL LOW (ref 4.22–5.81)
RDW: 14.6 % (ref 11.5–15.5)
WBC: 5.6 10*3/uL (ref 4.0–10.5)

## 2022-01-03 LAB — LIPID PANEL
Cholesterol: 119 mg/dL (ref 0–200)
HDL: 37.6 mg/dL — ABNORMAL LOW (ref >39.00)
LDL Cholesterol: 61 mg/dL (ref 0–99)
NonHDL: 80.96
Total CHOL/HDL Ratio: 3
Triglycerides: 102 mg/dL (ref 0.0–149.0)
VLDL: 20.4 mg/dL (ref 0.0–40.0)

## 2022-01-03 LAB — HEPATIC FUNCTION PANEL
ALT: 17 U/L (ref 0–53)
AST: 22 U/L (ref 0–37)
Albumin: 3.5 g/dL (ref 3.5–5.2)
Alkaline Phosphatase: 126 U/L — ABNORMAL HIGH (ref 39–117)
Bilirubin, Direct: 0.1 mg/dL (ref 0.0–0.3)
Total Bilirubin: 0.4 mg/dL (ref 0.2–1.2)
Total Protein: 7.1 g/dL (ref 6.0–8.3)

## 2022-01-03 LAB — VITAMIN D 25 HYDROXY (VIT D DEFICIENCY, FRACTURES): VITD: 11.36 ng/mL — ABNORMAL LOW (ref 30.00–100.00)

## 2022-01-03 LAB — PSA: PSA: 0.5 ng/mL (ref 0.10–4.00)

## 2022-01-03 LAB — VITAMIN B12: Vitamin B-12: 227 pg/mL (ref 211–911)

## 2022-01-03 LAB — HEMOGLOBIN A1C: Hgb A1c MFr Bld: 5.6 % (ref 4.6–6.5)

## 2022-01-03 NOTE — Assessment & Plan Note (Signed)
Lab Results  Component Value Date   LDLCALC 91 06/25/2018   Uncontrolled, pt to continue current statin liptior 40 mg - declines increase today

## 2022-01-03 NOTE — Assessment & Plan Note (Signed)
C/w familial type tremor, mild, to continue lopressor 50 bid,  to f/u any worsening symptoms or concerns

## 2022-01-03 NOTE — Assessment & Plan Note (Signed)
BP Readings from Last 3 Encounters:  01/03/22 136/72  08/13/21 (!) 150/82  06/19/21 132/84   Stable, pt to continue medical treatment lisinopril 20mg  qd, lopressor 50 mg bid

## 2022-01-03 NOTE — Assessment & Plan Note (Signed)
Lab Results  Component Value Date   HGBA1C 6.7 (H) 05/10/2019   Mild uncontrolled, pt to continue diet, excericse, wt control as delcines OHA for now

## 2022-01-03 NOTE — Patient Instructions (Signed)
You had the flu shot today  Please have your Shingrix (shingles) shots done at your local pharmacy.  Please continue all other medications as before, and refills have been done if requested.  Please have the pharmacy call with any other refills you may need.  Please continue your efforts at being more active, low cholesterol diet, and weight control.  Please keep your appointments with your specialists as you may have planned  Please go to the LAB at the blood drawing area for the tests to be done  You will be contacted by phone if any changes need to be made immediately.  Otherwise, you will receive a letter about your results with an explanation, but please check with MyChart first.  Please remember to sign up for MyChart if you have not done so, as this will be important to you in the future with finding out test results, communicating by private email, and scheduling acute appointments online when needed.  Please make an Appointment to return in 4 months, or sooner if needed

## 2022-01-03 NOTE — Progress Notes (Signed)
Patient ID: Phillip Ohm., male   DOB: 28-Dec-1960, 61 y.o.   MRN: [id number]        Chief Complaint: follow up HTN, HLD and hyperglycemia, tremor right hand       HPI:  Phillip Canady. is a 61 y.o. male here with c/o new worsening right hand tremor, overall mild without gait change or other foacl neuro s/s; already taking lopressor 50 bid.  Pt denies chest pain, increased sob or doe, wheezing, orthopnea, PND, increased LE swelling, palpitations, dizziness or syncope.   Pt denies polydipsia, polyuria, or new focal neuro s/s.    Pt denies fever, wt loss, night sweats, loss of appetite, or other constitutional symptoms  Still has a painful subq tender firm mass to the left medial instep, still has not seen podiatry but plans to do this.  Due for flu shot       Wt Readings from Last 3 Encounters:  01/03/22 285 lb (129.3 kg)  08/13/21 295 lb 4 oz (133.9 kg)  06/19/21 300 lb (136.1 kg)   BP Readings from Last 3 Encounters:  01/03/22 136/72  08/13/21 (!) 150/82  06/19/21 132/84         Past Medical History:  Diagnosis Date   Allergic rhinitis    Anemia of chronic disease    Anxiety    Asthma    Autism    Bladder 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/29/2011   Depression    Gallstone    GERD (gastroesophageal reflux disease)    Hyperlipidemia    Hypertension    IBS (irritable bowel syndrome)    Obesity    OSA (obstructive sleep apnea)    Pneumonia    UTI (urinary tract infection)    Past Surgical History:  Procedure Laterality Date   ORIF ANKLE FRACTURE Left 03/02/2015   Procedure: OPEN REDUCTION INTERNAL FIXATION (ORIF) LEFT ANKLE TRIMALLEOLAR FRACTURE;  Surgeon: Toni Arthurs, MD;  Location: [facility] OR;  Service: Orthopedics;  Laterality: Left;    reports that he has never smoked. He has never used smokeless tobacco. He reports that he does not drink alcohol and does not use drugs. family history includes Alcohol abuse in an other family member; Arthritis in an other family member; Breast cancer in  an other family member; Diabetes in an other family member; Heart disease in his father and another family member; Hyperlipidemia in an other family member; Irritable bowel syndrome in his mother; Kidney disease in an other family member; Pancreatic cancer in an other family member; Prostate cancer in an other family member; Stroke in an other family member. Allergies  Allergen Reactions   Fluoxetine Hcl Other (See Comments)    ineffective   Xanax [Alprazolam] Other (See Comments)    Made patient too sleepy   Current Outpatient Medications on File Prior to Visit  Medication Sig Dispense Refill   albuterol (VENTOLIN HFA) 108 (90 Base) MCG/ACT inhaler Inhale 2 puffs into the lungs every 6 (six) hours as needed for wheezing or shortness of breath. 3 Inhaler 3   ammonium lactate (AMLACTIN) 12 % lotion Apply 1 Application topically as needed for dry skin. 400 g 0   aspirin EC 81 MG tablet Take 81 mg by mouth daily.     atorvastatin (LIPITOR) 40 MG tablet Take 1 tablet (40 mg total) by mouth daily. 90 tablet 3   buPROPion (WELLBUTRIN XL) 300 MG 24 hr tablet Take 1 tablet (300 mg total) by mouth daily. 90 tablet 3   clobetasol  cream (TEMOVATE) 0.05 % Apply 1 Application topically 2 (two) times daily.     ELIQUIS 5 MG TABS tablet TAKE 1 TABLET BY MOUTH TWICE A DAY 180 tablet 3   gabapentin (NEURONTIN) 300 MG capsule Take 2 capsules (600 mg total) by mouth at bedtime. 180 capsule 1   halobetasol (ULTRAVATE) 0.05 % ointment Apply 1 application topically 2 (two) times daily.     hydroxypropyl methylcellulose / hypromellose (ISOPTO TEARS / GONIOVISC) 2.5 % ophthalmic solution Place 1 drop into both eyes 3 (three) times daily as needed for dry eyes.     lisinopril (ZESTRIL) 20 MG tablet TAKE 1 TABLET BY MOUTH EVERY DAY 90 tablet 3   metoprolol tartrate (LOPRESSOR) 50 MG tablet Take 1 tablet (50 mg total) by mouth 2 (two) times daily. 180 tablet 3   mupirocin ointment (BACTROBAN) 2 % Apply 1 application  topically 2 (two) times daily.     nystatin (MYCOSTATIN/NYSTOP) powder Use as directed twice daily as needed 45 g 3   ondansetron (ZOFRAN) 4 MG tablet Take 1 tablet (4 mg total) by mouth every 8 (eight) hours as needed for nausea or vomiting. 40 tablet 1   pantoprazole (PROTONIX) 40 MG tablet Take 1 tablet (40 mg total) by mouth daily. 90 tablet 3   predniSONE (DELTASONE) 10 MG tablet 3 tabs by mouth per day for 3 days,2tabs per day for 3 days,1tab per day for 3 days 18 tablet 0   sertraline (ZOLOFT) 100 MG tablet Take 2 tablets (200 mg total) by mouth daily. 180 tablet 3   tamsulosin (FLOMAX) 0.4 MG CAPS capsule Take 1 capsule (0.4 mg total) by mouth 2 (two) times daily. (Patient taking differently: Take 0.4 mg by mouth daily.) 180 capsule 3   traZODone (DESYREL) 100 MG tablet Take 1 tablet (100 mg total) by mouth at bedtime as needed for sleep. 90 tablet 1   triamcinolone (NASACORT) 55 MCG/ACT AERO nasal inhaler Place 2 sprays into the nose daily. 1 Inhaler 12   triamcinolone cream (KENALOG) 0.1 % Apply 1 application. topically 2 (two) times daily. To legs for itching 453.6 g 0   furosemide (LASIX) 40 MG tablet Take 1 tablet (40 mg total) by mouth daily. 90 tablet 3   No current facility-administered medications on file prior to visit.        ROS:  All others reviewed and negative.  Objective        PE:  BP 136/72 (BP Location: Left Arm, Patient Position: Sitting, Cuff Size: Large)   Pulse (!) 122   Temp 98.4 F (36.9 C) (Oral)   Ht 5\' 10"  (1.778 m)   Wt 285 lb (129.3 kg)   SpO2 93%   BMI 40.89 kg/m                 Constitutional: Pt appears in NAD               HENT: Head: NCAT.                Right Ear: External ear normal.                 Left Ear: External ear normal.                Eyes: . Pupils are equal, round, and reactive to light. Conjunctivae and EOM are normal               Nose: without d/c or deformity  Neck: Neck supple. Gross normal ROM                Cardiovascular: Normal rate and regular rhythm.                 Pulmonary/Chest: Effort normal and breath sounds without rales or wheezing.                Abd:  Soft, NT, ND, + BS, no organomegaly               Neurological: Pt is alert. At baseline orientation, motor grossly intact, + mild right hand constant tremor               Skin: Skin is warm. No rashes, no other new lesions, LE edema - trace to 1+ bilateral               Psychiatric: Pt behavior is normal without agitation   Micro: none  Cardiac tracings I have personally interpreted today:  none  Pertinent Radiological findings (summarize): none   Lab Results  Component Value Date   WBC 5.2 07/11/2020   HGB 10.7 (L) 07/11/2020   HCT 35.3 (L) 07/11/2020   PLT 205 07/11/2020   GLUCOSE 99 07/11/2020   CHOL 164 05/10/2019   TRIG 204.0 (H) 05/10/2019   HDL 50.20 05/10/2019   LDLDIRECT 77.0 05/10/2019   LDLCALC 91 06/25/2018   ALT 27 07/11/2020   AST 24 07/11/2020   NA 140 07/11/2020   K 4.3 07/11/2020   CL 103 07/11/2020   CREATININE 1.34 (H) 07/11/2020   BUN 19 07/11/2020   CO2 31 07/11/2020   TSH 1.35 05/10/2019   PSA 0.33 05/10/2019   INR 1.05 03/02/2015   HGBA1C 6.7 (H) 05/10/2019   Assessment/Plan:  Phillip Sisemore. is a 61 y.o. White or Caucasian [1] male with  has a past medical history of Allergic rhinitis, Anemia of chronic disease, Anxiety, Asthma, Autism, Bladder neck obstruction (05/29/2011), Depression, Gallstone, GERD (gastroesophageal reflux disease), Hyperlipidemia, Hypertension, IBS (irritable bowel syndrome), Obesity, OSA (obstructive sleep apnea), Pneumonia, and UTI (urinary tract infection).  Essential hypertension BP Readings from Last 3 Encounters:  01/03/22 136/72  08/13/21 (!) 150/82  06/19/21 132/84   Stable, pt to continue medical treatment lisinopril 20mg  qd, lopressor 50 mg bid   Hyperglycemia Lab Results  Component Value Date   HGBA1C 6.7 (H) 05/10/2019   Mild uncontrolled, pt  to continue diet, excericse, wt control as delcines OHA for now   Hyperlipidemia Lab Results  Component Value Date   LDLCALC 91 06/25/2018   Uncontrolled, pt to continue current statin liptior 40 mg - declines increase today   Tremor C/w familial type tremor, mild, to continue lopressor 50 bid,  to f/u any worsening symptoms or concerns  Followup: Return in about 4 months (around 05/05/2022).  05/07/2022, MD 01/03/2022 3:10 PM [facility]

## 2022-01-04 ENCOUNTER — Encounter: Payer: Self-pay | Admitting: Internal Medicine

## 2022-01-04 ENCOUNTER — Other Ambulatory Visit: Payer: Self-pay | Admitting: Internal Medicine

## 2022-01-04 DIAGNOSIS — R7989 Other specified abnormal findings of blood chemistry: Secondary | ICD-10-CM

## 2022-01-04 DIAGNOSIS — N179 Acute kidney failure, unspecified: Secondary | ICD-10-CM

## 2022-01-04 DIAGNOSIS — E0590 Thyrotoxicosis, unspecified without thyrotoxic crisis or storm: Secondary | ICD-10-CM

## 2022-01-04 LAB — URINALYSIS, ROUTINE W REFLEX MICROSCOPIC
Bilirubin Urine: NEGATIVE
Hgb urine dipstick: NEGATIVE
Ketones, ur: NEGATIVE
Leukocytes,Ua: NEGATIVE
Nitrite: NEGATIVE
Specific Gravity, Urine: 1.02 (ref 1.000–1.030)
Urine Glucose: NEGATIVE
Urobilinogen, UA: 0.2 (ref 0.0–1.0)
pH: 6 (ref 5.0–8.0)

## 2022-01-04 LAB — URINE CULTURE: Result:: NO GROWTH

## 2022-01-04 LAB — TSH: TSH: <0.01 u[IU]/mL — ABNORMAL LOW (ref 0.35–5.50)

## 2022-01-08 ENCOUNTER — Ambulatory Visit: Payer: Medicare Other | Admitting: Podiatry

## 2022-01-08 ENCOUNTER — Telehealth: Payer: Self-pay | Admitting: Internal Medicine

## 2022-01-08 NOTE — Telephone Encounter (Signed)
Patients states he received a call about his appointment he had and how everything was fine except one thing, he wanted to know what that was. Patient can be reached at [phone].

## 2022-01-09 ENCOUNTER — Ambulatory Visit: Payer: Medicare Other | Admitting: Podiatry

## 2022-01-09 NOTE — Telephone Encounter (Signed)
Left message for a call back regarding concerns

## 2022-01-23 ENCOUNTER — Encounter: Payer: Self-pay | Admitting: Podiatry

## 2022-01-23 ENCOUNTER — Ambulatory Visit (INDEPENDENT_AMBULATORY_CARE_PROVIDER_SITE_OTHER): Payer: Medicare Other | Admitting: Podiatry

## 2022-01-23 DIAGNOSIS — B351 Tinea unguium: Secondary | ICD-10-CM

## 2022-01-23 DIAGNOSIS — M79675 Pain in left toe(s): Secondary | ICD-10-CM | POA: Diagnosis not present

## 2022-01-23 DIAGNOSIS — M79674 Pain in right toe(s): Secondary | ICD-10-CM | POA: Diagnosis not present

## 2022-01-23 DIAGNOSIS — I739 Peripheral vascular disease, unspecified: Secondary | ICD-10-CM

## 2022-01-23 NOTE — Progress Notes (Signed)
This patient returns to my office for at risk foot care.  This patient requires this care by a professional since this patient will be at risk due to having  coagulation defect due to eliquis.  This patient is unable to cut nails himself since the patient cannot reach his nails.These nails are painful walking and wearing shoes.  This patient presents for at risk foot care today.  General Appearance  Alert, conversant and in no acute stress.  Vascular  Dorsalis pedis and posterior tibial  pulses are palpable  bilaterally.  Capillary return is within normal limits  bilaterally. Temperature is within normal limits  bilaterally.  Neurologic  Senn-Weinstein monofilament wire test within normal limits  bilaterally. Muscle power within normal limits bilaterally.  Nails Thick disfigured discolored nails with subungual debris  from hallux to fifth toes bilaterally. No evidence of bacterial infection or drainage bilaterally.  Orthopedic  No limitations of motion  feet .  No crepitus or effusions noted.  No bony pathology or digital deformities noted.  Skin  normotropic skin with no porokeratosis noted bilaterally.  No signs of infections or ulcers noted.     Onychomycosis  Pain in right toes  Pain in left toes  Consent was obtained for treatment procedures.   Mechanical debridement of nails 1-5  bilaterally performed with a nail nipper.  Filed with dremel without incident.    Return office visit  3 months                   Told patient to return for periodic foot care and evaluation due to potential at risk complications.   Cadince Hilscher DPM   

## 2022-02-16 ENCOUNTER — Other Ambulatory Visit: Payer: Self-pay | Admitting: Podiatry

## 2022-02-16 DIAGNOSIS — I5033 Acute on chronic diastolic (congestive) heart failure: Secondary | ICD-10-CM | POA: Diagnosis not present

## 2022-03-06 ENCOUNTER — Other Ambulatory Visit: Payer: Self-pay | Admitting: Internal Medicine

## 2022-03-18 ENCOUNTER — Other Ambulatory Visit: Payer: Self-pay | Admitting: Internal Medicine

## 2022-03-18 ENCOUNTER — Other Ambulatory Visit: Payer: Self-pay

## 2022-03-18 ENCOUNTER — Telehealth: Payer: Self-pay | Admitting: Internal Medicine

## 2022-03-18 MED ORDER — METOPROLOL TARTRATE 50 MG PO TABS: 50.0000 mg | ORAL_TABLET | Freq: Two times a day (BID) | ORAL | 3 refills | Status: DC

## 2022-03-18 NOTE — Telephone Encounter (Signed)
Patient needs a refill of his metoprolol.

## 2022-03-18 NOTE — Telephone Encounter (Signed)
Refill sent to pharmacy.   

## 2022-03-19 DIAGNOSIS — I5033 Acute on chronic diastolic (congestive) heart failure: Secondary | ICD-10-CM | POA: Diagnosis not present

## 2022-03-26 ENCOUNTER — Ambulatory Visit: Payer: 59 | Admitting: Internal Medicine

## 2022-04-02 ENCOUNTER — Ambulatory Visit: Payer: 59 | Admitting: Internal Medicine

## 2022-04-09 ENCOUNTER — Ambulatory Visit (INDEPENDENT_AMBULATORY_CARE_PROVIDER_SITE_OTHER): Payer: 59 | Admitting: Internal Medicine

## 2022-04-09 VITALS — BP 136/78 | HR 58 | Temp 98.4°F | Ht 70.0 in | Wt 296.0 lb

## 2022-04-09 DIAGNOSIS — E559 Vitamin D deficiency, unspecified: Secondary | ICD-10-CM

## 2022-04-09 DIAGNOSIS — Z125 Encounter for screening for malignant neoplasm of prostate: Secondary | ICD-10-CM

## 2022-04-09 DIAGNOSIS — I10 Essential (primary) hypertension: Secondary | ICD-10-CM

## 2022-04-09 DIAGNOSIS — H6120 Impacted cerumen, unspecified ear: Secondary | ICD-10-CM | POA: Insufficient documentation

## 2022-04-09 DIAGNOSIS — H6123 Impacted cerumen, bilateral: Secondary | ICD-10-CM | POA: Diagnosis not present

## 2022-04-09 DIAGNOSIS — R739 Hyperglycemia, unspecified: Secondary | ICD-10-CM | POA: Diagnosis not present

## 2022-04-09 DIAGNOSIS — H538 Other visual disturbances: Secondary | ICD-10-CM

## 2022-04-09 DIAGNOSIS — E538 Deficiency of other specified B group vitamins: Secondary | ICD-10-CM | POA: Diagnosis not present

## 2022-04-09 DIAGNOSIS — M79672 Pain in left foot: Secondary | ICD-10-CM

## 2022-04-09 DIAGNOSIS — Z0001 Encounter for general adult medical examination with abnormal findings: Secondary | ICD-10-CM

## 2022-04-09 DIAGNOSIS — F418 Other specified anxiety disorders: Secondary | ICD-10-CM

## 2022-04-09 LAB — MICROALBUMIN / CREATININE URINE RATIO
Creatinine,U: 55 mg/dL
Microalb Creat Ratio: 36.4 mg/g — ABNORMAL HIGH (ref 0.0–30.0)
Microalb, Ur: 20 mg/dL — ABNORMAL HIGH (ref 0.0–1.9)

## 2022-04-09 LAB — CBC WITH DIFFERENTIAL/PLATELET
Basophils Absolute: 0 10*3/uL (ref 0.0–0.1)
Basophils Relative: 0.7 % (ref 0.0–3.0)
Eosinophils Absolute: 0.4 10*3/uL (ref 0.0–0.7)
Eosinophils Relative: 5.8 % — ABNORMAL HIGH (ref 0.0–5.0)
HCT: 36.8 % — ABNORMAL LOW (ref 39.0–52.0)
Hemoglobin: 12 g/dL — ABNORMAL LOW (ref 13.0–17.0)
Lymphocytes Relative: 22.3 % (ref 12.0–46.0)
Lymphs Abs: 1.5 10*3/uL (ref 0.7–4.0)
MCHC: 32.6 g/dL (ref 30.0–36.0)
MCV: 88.8 fl (ref 78.0–100.0)
Monocytes Absolute: 0.7 10*3/uL (ref 0.1–1.0)
Monocytes Relative: 10.3 % (ref 3.0–12.0)
Neutro Abs: 4 10*3/uL (ref 1.4–7.7)
Neutrophils Relative %: 60.9 % (ref 43.0–77.0)
Platelets: 225 10*3/uL (ref 150.0–400.0)
RBC: 4.14 Mil/uL — ABNORMAL LOW (ref 4.22–5.81)
RDW: 15.1 % (ref 11.5–15.5)
WBC: 6.6 10*3/uL (ref 4.0–10.5)

## 2022-04-09 LAB — BASIC METABOLIC PANEL
BUN: 29 mg/dL — ABNORMAL HIGH (ref 6–23)
CO2: 27 mEq/L (ref 19–32)
Calcium: 8.8 mg/dL (ref 8.4–10.5)
Chloride: 107 mEq/L (ref 96–112)
Creatinine, Ser: 1.65 mg/dL — ABNORMAL HIGH (ref 0.40–1.50)
GFR: 44.31 mL/min — ABNORMAL LOW (ref >60.00)
Glucose, Bld: 99 mg/dL (ref 70–99)
Potassium: 4.7 mEq/L (ref 3.5–5.1)
Sodium: 141 mEq/L (ref 135–145)

## 2022-04-09 LAB — VITAMIN D 25 HYDROXY (VIT D DEFICIENCY, FRACTURES): VITD: <7 ng/mL — ABNORMAL LOW (ref 30.00–100.00)

## 2022-04-09 LAB — HEMOGLOBIN A1C: Hgb A1c MFr Bld: 5.8 % (ref 4.6–6.5)

## 2022-04-09 LAB — LIPID PANEL
Cholesterol: 157 mg/dL (ref 0–200)
HDL: 39 mg/dL — ABNORMAL LOW (ref >39.00)
LDL Cholesterol: 97 mg/dL (ref 0–99)
NonHDL: 117.87
Total CHOL/HDL Ratio: 4
Triglycerides: 103 mg/dL (ref 0.0–149.0)
VLDL: 20.6 mg/dL (ref 0.0–40.0)

## 2022-04-09 LAB — HEPATIC FUNCTION PANEL
ALT: 19 U/L (ref 0–53)
AST: 21 U/L (ref 0–37)
Albumin: 3.7 g/dL (ref 3.5–5.2)
Alkaline Phosphatase: 201 U/L — ABNORMAL HIGH (ref 39–117)
Bilirubin, Direct: 0.1 mg/dL (ref 0.0–0.3)
Total Bilirubin: 0.4 mg/dL (ref 0.2–1.2)
Total Protein: 7.6 g/dL (ref 6.0–8.3)

## 2022-04-09 LAB — VITAMIN B12: Vitamin B-12: 228 pg/mL (ref 211–911)

## 2022-04-09 LAB — PSA: PSA: 0.52 ng/mL (ref 0.10–4.00)

## 2022-04-09 LAB — TSH: TSH: <0.01 u[IU]/mL — ABNORMAL LOW (ref 0.35–5.50)

## 2022-04-09 MED ORDER — CLONAZEPAM 0.5 MG PO TABS: 0.5000 mg | ORAL_TABLET | Freq: Two times a day (BID) | ORAL | 1 refills | Status: AC | PRN

## 2022-04-09 NOTE — Progress Notes (Unsigned)
Patient ID: Phillip Carwin., male   DOB: [birth year], 62 y.o.   MRN: [id number]         Chief Complaint:: wellness exam and Vision concerns  (Blurred vision)  , anxiety, left instep pain tendonitis, bilateral ear wax       HPI:  Phillip Spracklen. is a 62 y.o. male here for wellness exam; declines colonoscopy, for shingrix at pharmacy, o/w up to date                        Also Pt denies chest pain, increased sob or doe, wheezing, orthopnea, PND, increased LE swelling, palpitations, dizziness or syncope.  Does have mild worsening blurry vision both eyes recently, but  Pt denies polydipsia, polyuria, or new focal neuro s/s.   Has persistent tender pain swelling to the left mid instep tendon, and has bilateral hearing muffled with wax impactions.   Pt denies fever, wt loss, night sweats, loss of appetite, or other constitutional symptoms  Denies worsening depressive symptoms, suicidal ideation, or panic; but has mild worsening anxiety.     Wt Readings from Last 3 Encounters:  04/09/22 296 lb (134.3 kg)  01/03/22 285 lb (129.3 kg)  08/13/21 295 lb 4 oz (133.9 kg)   BP Readings from Last 3 Encounters:  04/09/22 136/78  01/03/22 136/72  08/13/21 (!) 150/82   Immunization History  Administered Date(s) Administered   Fluad Quad(high Dose 65+) 01/03/2022   Influenza,inj,Quad PF,6+ Mos 02/20/2014, 10/23/2016, 02/26/2017, 09/23/2017, 09/29/2018, 03/04/2019, 03/07/2020, 11/15/2020, 03/05/2021   Influenza-Unspecified 01/28/2018   PPD Test 03/06/2015   Td 01/22/2004   Tdap 02/21/2014, 07/31/2017   Health Maintenance Due  Topic Date Due   Medicare Annual Wellness (AWV)  08/31/2021      Past Medical History:  Diagnosis Date   Allergic rhinitis    Anemia of chronic disease    Anxiety    Asthma    Autism    Bladder 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/29/2011   Depression    Gallstone    GERD (gastroesophageal reflux disease)    Hyperlipidemia    Hypertension    IBS (irritable bowel syndrome)     Obesity    OSA (obstructive sleep apnea)    Pneumonia    UTI (urinary tract infection)    Past Surgical History:  Procedure Laterality Date   ORIF ANKLE FRACTURE Left 03/02/2015   Procedure: OPEN REDUCTION INTERNAL FIXATION (ORIF) LEFT ANKLE TRIMALLEOLAR FRACTURE;  Surgeon: Wylene Simmer, MD;  Location: [facility];  Service: Orthopedics;  Laterality: Left;    reports that he has never smoked. He has never used smokeless tobacco. He reports that he does not drink alcohol and does not use drugs. family history includes Alcohol abuse in an other family member; Arthritis in an other family member; Breast cancer in an other family member; Diabetes in an other family member; Heart disease in his father and another family member; Hyperlipidemia in an other family member; Irritable bowel syndrome in his mother; Kidney disease in an other family member; Pancreatic cancer in an other family member; Prostate cancer in an other family member; Stroke in an other family member. Allergies  Allergen Reactions   Fluoxetine Hcl Other (See Comments)    ineffective   Xanax [Alprazolam] Other (See Comments)    Made patient too sleepy   Current Outpatient Medications on File Prior to Visit  Medication Sig Dispense Refill   albuterol (VENTOLIN HFA) 108 (90 Base) MCG/ACT inhaler Inhale 2  puffs into the lungs every 6 (six) hours as needed for wheezing or shortness of breath. 3 Inhaler 3   ammonium lactate (AMLACTIN) 12 % lotion Apply 1 Application topically as needed for dry skin. 400 g 0   aspirin EC 81 MG tablet Take 81 mg by mouth daily.     atorvastatin (LIPITOR) 40 MG tablet Take 1 tablet (40 mg total) by mouth daily. 90 tablet 3   buPROPion (WELLBUTRIN XL) 300 MG 24 hr tablet Take 1 tablet (300 mg total) by mouth daily. 90 tablet 3   clobetasol cream (TEMOVATE) [id number] % Apply 1 Application topically 2 (two) times daily.     ELIQUIS 5 MG TABS tablet TAKE 1 TABLET BY MOUTH TWICE A DAY 180 tablet 3   gabapentin  (NEURONTIN) 300 MG capsule Take 2 capsules (600 mg total) by mouth at bedtime. 180 capsule 1   halobetasol (ULTRAVATE) 0.05 % ointment Apply 1 application topically 2 (two) times daily.     hydroxypropyl methylcellulose / hypromellose (ISOPTO TEARS / GONIOVISC) 2.5 % ophthalmic solution Place 1 drop into both eyes 3 (three) times daily as needed for dry eyes.     lisinopril (ZESTRIL) 20 MG tablet TAKE 1 TABLET BY MOUTH EVERY DAY 90 tablet 3   metoprolol tartrate (LOPRESSOR) 50 MG tablet Take 1 tablet (50 mg total) by mouth 2 (two) times daily. 180 tablet 3   mupirocin ointment (BACTROBAN) 2 % Apply 1 application topically 2 (two) times daily.     nystatin (MYCOSTATIN/NYSTOP) powder Use as directed twice daily as needed 45 g 3   ondansetron (ZOFRAN) 4 MG tablet Take 1 tablet (4 mg total) by mouth every 8 (eight) hours as needed for nausea or vomiting. 40 tablet 1   pantoprazole (PROTONIX) 40 MG tablet TAKE 1 TABLET BY MOUTH EVERY DAY 90 tablet 3   predniSONE (DELTASONE) 10 MG tablet 3 tabs by mouth per day for 3 days,2tabs per day for 3 days,1tab per day for 3 days 18 tablet 0   sertraline (ZOLOFT) 100 MG tablet TAKE 2 TABLETS BY MOUTH EVERY DAY 180 tablet 3   tamsulosin (FLOMAX) 0.4 MG CAPS capsule Take 1 capsule (0.4 mg total) by mouth 2 (two) times daily. (Patient taking differently: Take 0.4 mg by mouth daily.) 180 capsule 3   traZODone (DESYREL) 100 MG tablet TAKE 1 TABLET BY MOUTH AT BEDTIME AS NEEDED FOR SLEEP. 90 tablet 1   triamcinolone (NASACORT) 55 MCG/ACT AERO nasal inhaler Place 2 sprays into the nose daily. 1 Inhaler 12   triamcinolone cream (KENALOG) 0.1 % Apply 1 application. topically 2 (two) times daily. To legs for itching 453.6 g 0   furosemide (LASIX) 40 MG tablet Take 1 tablet (40 mg total) by mouth daily. 90 tablet 3   No current facility-administered medications on file prior to visit.        ROS:  All others reviewed and negative.  Objective        PE:  BP 136/78    Pulse (!) 58   Temp 98.4 F (36.9 C) (Oral)   Ht 5\' 10"  (1.778 m)   Wt 296 lb (134.3 kg)   SpO2 95%   BMI 42.47 kg/m                 Constitutional: Pt appears in NAD               HENT: Head: NCAT.  Right Ear: External ear normal.                 Left Ear: External ear normal.                Eyes: . Pupils are equal, round, and reactive to light. Conjunctivae and EOM are normal               Nose: without d/c or deformity               Neck: Neck supple. Gross normal ROM               Cardiovascular: Normal rate and regular rhythm.                 Pulmonary/Chest: Effort normal and breath sounds without rales or wheezing.                Abd:  Soft, NT, ND, + BS, no organomegaly               Neurological: Pt is alert. At baseline orientation, motor grossly intact               Skin: Skin is warm. No rashes, no other new lesions, LE edema - none; left mid instep with ganglion cyst like swelling to the first plantar tendon               Psychiatric: Pt behavior is normal without agitation, mod nervous  Micro: none  Cardiac tracings I have personally interpreted today:  none  Pertinent Radiological findings (summarize): none   Lab Results  Component Value Date   WBC 6.6 04/09/2022   HGB 12.0 (L) 04/09/2022   HCT 36.8 (L) 04/09/2022   PLT 225.0 04/09/2022   GLUCOSE 99 04/09/2022   CHOL 157 04/09/2022   TRIG 103.0 04/09/2022   HDL 39.00 (L) 04/09/2022   LDLDIRECT 77.0 05/10/2019   LDLCALC 97 04/09/2022   ALT 19 04/09/2022   AST 21 04/09/2022   NA 141 04/09/2022   K 4.7 04/09/2022   CL 107 04/09/2022   CREATININE 1.65 (H) 04/09/2022   BUN 29 (H) 04/09/2022   CO2 27 04/09/2022   TSH <0.01 (L) 04/09/2022   PSA 0.52 04/09/2022   INR 1.05 03/02/2015   HGBA1C 5.8 04/09/2022   MICROALBUR 20.0 (H) 04/09/2022   Assessment/Plan:  Phillip Butler. is a 62 y.o. White or Caucasian [1] male with  has a past medical history of Allergic rhinitis, Anemia of chronic  disease, Anxiety, Asthma, Autism, Bladder neck obstruction (05/29/2011), Depression, Gallstone, GERD (gastroesophageal reflux disease), Hyperlipidemia, Hypertension, IBS (irritable bowel syndrome), Obesity, OSA (obstructive sleep apnea), Pneumonia, and UTI (urinary tract infection).  Cerumen impaction Ceruminosis is noted.  Wax is removed by syringing and manual debridement. Instructions for home care to prevent wax buildup are given.   Encounter for well adult exam with abnormal findings Age and sex appropriate education and counseling updated with regular exercise and diet Referrals for preventative services - declines colonoscopy Immunizations addressed - for shingrix at pharmacy Smoking counseling  - none needed Evidence for depression or other mood disorder - none significant Most recent labs reviewed. I have personally reviewed and have noted: 1) the patient's medical and social history 2) The patient's current medications and supplements 3) The patient's height, weight, and BMI have been recorded in the chart   Depression with anxiety With mild worsening anxiety - for clonazepam 0.5 bid prn  Essential hypertension BP Readings  from Last 3 Encounters:  04/09/22 136/78  01/03/22 136/72  08/13/21 (!) 150/82   Stable, pt to continue medical treatment lisinopril 20 qd, lopressor 20 bid   Hyperglycemia Lab Results  Component Value Date   HGBA1C 5.8 04/09/2022   Stable, pt to continue current medical treatment  - diet, wt control   Blurred vision Etiology unclear, for optho referral  Left foot pain ? Ganglion cyst mid instep - for sport med referral  Followup: Return in about 6 months (around 10/10/2022).  Cathlean Cower, MD 04/10/2022 10:02 PM [facility]

## 2022-04-09 NOTE — Patient Instructions (Signed)
Your ears were irrigated of wax today  Please take all new medication as prescribed  - the clonazepam as needed for anxiety  Please continue all other medications as before, and refills have been done if requested.  Please have the pharmacy call with any other refills you may need.  Please continue your efforts at being more active, low cholesterol diet, and weight control.  You are otherwise up to date with prevention measures today.  Please keep your appointments with your specialists as you may have planned  You will be contacted regarding the referral for: Sports Medicine, and eye doctor  Please go to the LAB at the blood drawing area for the tests to be done  You will be contacted by phone if any changes need to be made immediately.  Otherwise, you will receive a letter about your results with an explanation, but please check with MyChart first.  Please remember to sign up for MyChart if you have not done so, as this will be important to you in the future with finding out test results, communicating by private email, and scheduling acute appointments online when needed.  Please make an Appointment to return in 6 months, or sooner if needed

## 2022-04-09 NOTE — Progress Notes (Unsigned)
PRE-PROCEDURE EXAM: Left TM cannot be visualized due to total occlusion/impaction of the ear canal.  PROCEDURE INDICATION: remove wax to visualize ear drum & relieve discomfort  CONSENT:  Verbal     PROCEDURE NOTE:     BILATERAL EAR:  I used warm water irrigation under direct visualization with the otoscope to free the wax bolus from the ear canal.    POST- PROCEDURE EXAM: TMs successfully visualized and found to have no erythema     The patient tolerated the procedure well.   

## 2022-04-10 ENCOUNTER — Encounter: Payer: Self-pay | Admitting: Internal Medicine

## 2022-04-10 DIAGNOSIS — E0590 Thyrotoxicosis, unspecified without thyrotoxic crisis or storm: Secondary | ICD-10-CM | POA: Insufficient documentation

## 2022-04-10 LAB — URINALYSIS, ROUTINE W REFLEX MICROSCOPIC
Bilirubin Urine: NEGATIVE
Hgb urine dipstick: NEGATIVE
Ketones, ur: NEGATIVE
Leukocytes,Ua: NEGATIVE
Nitrite: NEGATIVE
Specific Gravity, Urine: 1.02 (ref 1.000–1.030)
Total Protein, Urine: 30 — AB
Urine Glucose: NEGATIVE
Urobilinogen, UA: 0.2 (ref 0.0–1.0)
pH: 6.5 (ref 5.0–8.0)

## 2022-04-10 NOTE — Assessment & Plan Note (Signed)
Ceruminosis is noted.  Wax is removed by syringing and manual debridement. Instructions for home care to prevent wax buildup are given.  

## 2022-04-10 NOTE — Assessment & Plan Note (Signed)
Age and sex appropriate education and counseling updated with regular exercise and diet Referrals for preventative services - declines colonoscopy Immunizations addressed - for shingrix at pharmacy Smoking counseling  - none needed Evidence for depression or other mood disorder - none significant Most recent labs reviewed. I have personally reviewed and have noted: 1) the patient's medical and social history 2) The patient's current medications and supplements 3) The patient's height, weight, and BMI have been recorded in the chart

## 2022-04-10 NOTE — Assessment & Plan Note (Signed)
Lab Results  Component Value Date   HGBA1C 5.8 04/09/2022   Stable, pt to continue current medical treatment  - diet, wt control

## 2022-04-10 NOTE — Assessment & Plan Note (Signed)
BP Readings from Last 3 Encounters:  04/09/22 136/78  01/03/22 136/72  08/13/21 (!) 150/82   Stable, pt to continue medical treatment lisinopril 20 qd, lopressor 20 bid

## 2022-04-10 NOTE — Assessment & Plan Note (Signed)
Etiology unclear, for optho referral 

## 2022-04-10 NOTE — Assessment & Plan Note (Signed)
?   Ganglion cyst mid instep - for sport med referral

## 2022-04-10 NOTE — Assessment & Plan Note (Signed)
With mild worsening anxiety - for clonazepam 0.5 bid prn

## 2022-04-17 DIAGNOSIS — I5033 Acute on chronic diastolic (congestive) heart failure: Secondary | ICD-10-CM | POA: Diagnosis not present

## 2022-04-17 NOTE — Progress Notes (Unsigned)
Benito Mccreedy D.[place] Phone: [phone]   Assessment and Plan:     There are no diagnoses linked to this encounter.  ***   Pertinent previous records reviewed include ***   Follow Up: ***     Subjective:   I, Azaylah Stailey, am serving as a Education administrator for Doctor Glennon Mac  Chief Complaint: foot pain   HPI:   04/18/2022 Patient is a 62 year old male complaining of foot pain. Patient states  Relevant Historical Information: ***  Additional pertinent review of systems negative.   Current Outpatient Medications:    albuterol (VENTOLIN HFA) 108 (90 Base) MCG/ACT inhaler, Inhale 2 puffs into the lungs every 6 (six) hours as needed for wheezing or shortness of breath., Disp: 3 Inhaler, Rfl: 3   ammonium lactate (AMLACTIN) 12 % lotion, Apply 1 Application topically as needed for dry skin., Disp: 400 g, Rfl: 0   aspirin EC 81 MG tablet, Take 81 mg by mouth daily., Disp: , Rfl:    atorvastatin (LIPITOR) 40 MG tablet, Take 1 tablet (40 mg total) by mouth daily., Disp: 90 tablet, Rfl: 3   buPROPion (WELLBUTRIN XL) 300 MG 24 hr tablet, Take 1 tablet (300 mg total) by mouth daily., Disp: 90 tablet, Rfl: 3   clobetasol cream (TEMOVATE) [id number] %, Apply 1 Application topically 2 (two) times daily., Disp: , Rfl:    clonazePAM (KLONOPIN) 0.5 MG tablet, Take 1 tablet (0.5 mg total) by mouth 2 (two) times daily as needed for anxiety., Disp: 60 tablet, Rfl: 1   ELIQUIS 5 MG TABS tablet, TAKE 1 TABLET BY MOUTH TWICE A DAY, Disp: 180 tablet, Rfl: 3   furosemide (LASIX) 40 MG tablet, Take 1 tablet (40 mg total) by mouth daily., Disp: 90 tablet, Rfl: 3   gabapentin (NEURONTIN) 300 MG capsule, Take 2 capsules (600 mg total) by mouth at bedtime., Disp: 180 capsule, Rfl: 1   halobetasol (ULTRAVATE) 0.05 % ointment, Apply 1 application topically 2 (two) times daily., Disp: , Rfl:    hydroxypropyl methylcellulose /  hypromellose (ISOPTO TEARS / GONIOVISC) 2.5 % ophthalmic solution, Place 1 drop into both eyes 3 (three) times daily as needed for dry eyes., Disp: , Rfl:    lisinopril (ZESTRIL) 20 MG tablet, TAKE 1 TABLET BY MOUTH EVERY DAY, Disp: 90 tablet, Rfl: 3   metoprolol tartrate (LOPRESSOR) 50 MG tablet, Take 1 tablet (50 mg total) by mouth 2 (two) times daily., Disp: 180 tablet, Rfl: 3   mupirocin ointment (BACTROBAN) 2 %, Apply 1 application topically 2 (two) times daily., Disp: , Rfl:    nystatin (MYCOSTATIN/NYSTOP) powder, Use as directed twice daily as needed, Disp: 45 g, Rfl: 3   ondansetron (ZOFRAN) 4 MG tablet, Take 1 tablet (4 mg total) by mouth every 8 (eight) hours as needed for nausea or vomiting., Disp: 40 tablet, Rfl: 1   pantoprazole (PROTONIX) 40 MG tablet, TAKE 1 TABLET BY MOUTH EVERY DAY, Disp: 90 tablet, Rfl: 3   predniSONE (DELTASONE) 10 MG tablet, 3 tabs by mouth per day for 3 days,2tabs per day for 3 days,1tab per day for 3 days, Disp: 18 tablet, Rfl: 0   sertraline (ZOLOFT) 100 MG tablet, TAKE 2 TABLETS BY MOUTH EVERY DAY, Disp: 180 tablet, Rfl: 3   tamsulosin (FLOMAX) 0.4 MG CAPS capsule, Take 1 capsule (0.4 mg total) by mouth 2 (two) times daily. (Patient taking differently: Take 0.4 mg by mouth daily.), Disp:  180 capsule, Rfl: 3   traZODone (DESYREL) 100 MG tablet, TAKE 1 TABLET BY MOUTH AT BEDTIME AS NEEDED FOR SLEEP., Disp: 90 tablet, Rfl: 1   triamcinolone (NASACORT) 55 MCG/ACT AERO nasal inhaler, Place 2 sprays into the nose daily., Disp: 1 Inhaler, Rfl: 12   triamcinolone cream (KENALOG) 0.1 %, Apply 1 application. topically 2 (two) times daily. To legs for itching, Disp: 453.6 g, Rfl: 0   Objective:     There were no vitals filed for this visit.    There is no height or weight on file to calculate BMI.    Physical Exam:    ***   Electronically signed by:  Benito Mccreedy D.[facility] 7:31 AM 04/17/22

## 2022-04-18 ENCOUNTER — Ambulatory Visit (INDEPENDENT_AMBULATORY_CARE_PROVIDER_SITE_OTHER): Payer: 59 | Admitting: Sports Medicine

## 2022-04-18 ENCOUNTER — Ambulatory Visit (INDEPENDENT_AMBULATORY_CARE_PROVIDER_SITE_OTHER): Payer: 59

## 2022-04-18 VITALS — BP 136/66 | HR 76 | Ht 70.0 in | Wt 292.0 lb

## 2022-04-18 DIAGNOSIS — M79672 Pain in left foot: Secondary | ICD-10-CM | POA: Diagnosis not present

## 2022-04-18 DIAGNOSIS — M7732 Calcaneal spur, left foot: Secondary | ICD-10-CM | POA: Diagnosis not present

## 2022-04-18 NOTE — Patient Instructions (Addendum)
Recommend tylenol as needed  Voltaren gel over areas of pain Recommend following up with your podiatrist Dr. Sharyon Cable  [phone] As needed follow up

## 2022-04-25 ENCOUNTER — Ambulatory Visit: Payer: 59 | Admitting: Podiatry

## 2022-05-02 ENCOUNTER — Ambulatory Visit: Payer: 59 | Admitting: Podiatry

## 2022-05-08 ENCOUNTER — Telehealth: Payer: Self-pay

## 2022-05-08 NOTE — Telephone Encounter (Signed)
Called patient to schedule Medicare Annual Wellness Visit (AWV). Left message for patient to call back and schedule Medicare Annual Wellness Visit (AWV).  Last date of AWV: 08/31/20  Please schedule an appointment at any time on Annual Wellness Visit schedule.

## 2022-05-09 ENCOUNTER — Ambulatory Visit: Payer: 59 | Admitting: Podiatry

## 2022-05-16 ENCOUNTER — Encounter: Payer: Self-pay | Admitting: Podiatry

## 2022-05-16 ENCOUNTER — Ambulatory Visit (INDEPENDENT_AMBULATORY_CARE_PROVIDER_SITE_OTHER): Payer: 59 | Admitting: Podiatry

## 2022-05-16 DIAGNOSIS — M79675 Pain in left toe(s): Secondary | ICD-10-CM

## 2022-05-16 DIAGNOSIS — M79674 Pain in right toe(s): Secondary | ICD-10-CM | POA: Diagnosis not present

## 2022-05-16 DIAGNOSIS — B351 Tinea unguium: Secondary | ICD-10-CM | POA: Diagnosis not present

## 2022-05-16 NOTE — Progress Notes (Signed)
Subjective:   Patient ID: Phillip Butler., male   DOB: 62 y.o.   MRN: [id number]   HPI Patient presents with elongated nailbeds 1-5 both feet that are thick yellow and brittle and they become painful   ROS      Objective:  Physical Exam  Neurovascular status unchanged thick yellow brittle nailbeds 1-5 both feet painful     Assessment:  Chronic mycotic nail infection with pain poor health individual     Plan:  Debrided nailbeds 1-5 both feet nitrogen and bleeding reappoint routine care

## 2022-05-18 DIAGNOSIS — I5033 Acute on chronic diastolic (congestive) heart failure: Secondary | ICD-10-CM | POA: Diagnosis not present

## 2022-06-17 DIAGNOSIS — I5033 Acute on chronic diastolic (congestive) heart failure: Secondary | ICD-10-CM | POA: Diagnosis not present

## 2022-07-18 DIAGNOSIS — I5033 Acute on chronic diastolic (congestive) heart failure: Secondary | ICD-10-CM | POA: Diagnosis not present

## 2022-08-17 DIAGNOSIS — I5033 Acute on chronic diastolic (congestive) heart failure: Secondary | ICD-10-CM | POA: Diagnosis not present

## 2022-09-09 ENCOUNTER — Other Ambulatory Visit: Payer: Self-pay | Admitting: Internal Medicine

## 2022-09-17 DIAGNOSIS — I5033 Acute on chronic diastolic (congestive) heart failure: Secondary | ICD-10-CM | POA: Diagnosis not present

## 2022-10-10 ENCOUNTER — Ambulatory Visit (INDEPENDENT_AMBULATORY_CARE_PROVIDER_SITE_OTHER): Payer: 59 | Admitting: Internal Medicine

## 2022-10-10 ENCOUNTER — Encounter: Payer: Self-pay | Admitting: Internal Medicine

## 2022-10-10 VITALS — BP 138/80 | HR 47 | Temp 98.2°F | Ht 70.0 in | Wt 303.0 lb

## 2022-10-10 DIAGNOSIS — H538 Other visual disturbances: Secondary | ICD-10-CM | POA: Diagnosis not present

## 2022-10-10 DIAGNOSIS — E1165 Type 2 diabetes mellitus with hyperglycemia: Secondary | ICD-10-CM | POA: Diagnosis not present

## 2022-10-10 DIAGNOSIS — Z23 Encounter for immunization: Secondary | ICD-10-CM | POA: Diagnosis not present

## 2022-10-10 DIAGNOSIS — L609 Nail disorder, unspecified: Secondary | ICD-10-CM

## 2022-10-10 DIAGNOSIS — E0590 Thyrotoxicosis, unspecified without thyrotoxic crisis or storm: Secondary | ICD-10-CM | POA: Diagnosis not present

## 2022-10-10 DIAGNOSIS — H6123 Impacted cerumen, bilateral: Secondary | ICD-10-CM | POA: Diagnosis not present

## 2022-10-10 DIAGNOSIS — M67472 Ganglion, left ankle and foot: Secondary | ICD-10-CM

## 2022-10-10 DIAGNOSIS — Z7985 Long-term (current) use of injectable non-insulin antidiabetic drugs: Secondary | ICD-10-CM | POA: Diagnosis not present

## 2022-10-10 LAB — HEPATIC FUNCTION PANEL
ALT: 23 U/L (ref 0–53)
AST: 24 U/L (ref 0–37)
Albumin: 3.9 g/dL (ref 3.5–5.2)
Alkaline Phosphatase: 128 U/L — ABNORMAL HIGH (ref 39–117)
Bilirubin, Direct: 0.1 mg/dL (ref 0.0–0.3)
Total Bilirubin: 0.5 mg/dL (ref 0.2–1.2)
Total Protein: 8.1 g/dL (ref 6.0–8.3)

## 2022-10-10 LAB — BASIC METABOLIC PANEL
BUN: 21 mg/dL (ref 6–23)
CO2: 30 mEq/L (ref 19–32)
Calcium: 9.2 mg/dL (ref 8.4–10.5)
Chloride: 106 mEq/L (ref 96–112)
Creatinine, Ser: 1.74 mg/dL — ABNORMAL HIGH (ref 0.40–1.50)
GFR: 41.43 mL/min — ABNORMAL LOW (ref >60.00)
Glucose, Bld: 95 mg/dL (ref 70–99)
Potassium: 4.4 mEq/L (ref 3.5–5.1)
Sodium: 143 mEq/L (ref 135–145)

## 2022-10-10 LAB — HEMOGLOBIN A1C: Hgb A1c MFr Bld: 5.7 % (ref 4.6–6.5)

## 2022-10-10 LAB — LIPID PANEL
Cholesterol: 168 mg/dL (ref 0–200)
HDL: 48.5 mg/dL (ref >39.00)
LDL Cholesterol: 97 mg/dL (ref 0–99)
NonHDL: 119.06
Total CHOL/HDL Ratio: 3
Triglycerides: 108 mg/dL (ref 0.0–149.0)
VLDL: 21.6 mg/dL (ref 0.0–40.0)

## 2022-10-10 LAB — TSH: TSH: <0.01 u[IU]/mL — ABNORMAL LOW (ref 0.35–5.50)

## 2022-10-10 LAB — T4, FREE: Free T4: 0.91 ng/dL (ref 0.60–1.60)

## 2022-10-10 MED ORDER — GABAPENTIN 300 MG PO CAPS: 600.0000 mg | ORAL_CAPSULE | Freq: Every day | ORAL | 1 refills | Status: DC

## 2022-10-10 MED ORDER — TIRZEPATIDE 2.5 MG/0.5ML ~~LOC~~ SOAJ: 2.5000 mg | SUBCUTANEOUS | 11 refills | Status: DC

## 2022-10-10 MED ORDER — ALBUTEROL SULFATE HFA 108 (90 BASE) MCG/ACT IN AERS: 2.0000 | INHALATION_SPRAY | Freq: Four times a day (QID) | RESPIRATORY_TRACT | 3 refills | Status: DC | PRN

## 2022-10-10 NOTE — Patient Instructions (Signed)
Your ears were irrigated of wax today  Please take all new medication as prescribed - the mounjaro 2.5 mg weekly for sugar and weight loss  Please call in 1  month if you are doing ok with this for an increase to 5 mg  Please continue all other medications as before, and refills have been done if requested.  Please have the pharmacy call with any other refills you may need.  Please continue your efforts at being more active, low cholesterol diet, and weight control.  Please keep your appointments with your specialists as you may have planned  You will be contacted regarding the referral for: podiatry (foot doctor), eye doctor  Please go to the LAB at the blood drawing area for the tests to be done  You will be contacted by phone if any changes need to be made immediately.  Otherwise, you will receive a letter about your results with an explanation, but please check with MyChart first.  Please remember to sign up for MyChart if you have not done so, as this will be important to you in the future with finding out test results, communicating by private email, and scheduling acute appointments online when needed.  Please make an Appointment to return in 6 months, or sooner if needed, also with Lab Appointment for testing done 3-5 days before at the FIRST FLOOR Lab (so this is for TWO appointments - please see the scheduling desk as you leave)

## 2022-10-11 NOTE — Progress Notes (Unsigned)
Patient ID: Phillip Ohm., male   DOB: 03-Oct-1960, 62 y.o.   MRN: [id number]        Chief Complaint: follow up left midfoot swelling pain knot, toe nail disorder, blurred vision, dm with obesity, bilateral cerumen impaction       HPI:  Phillip Hucks. is a 62 y.o. male here with c/o pain tender sweling knot area to first dorsal compartment left plantar foot with recent overuse; Pt denies chest pain, increased sob or doe, wheezing, orthopnea, PND, increased LE swelling, palpitations, dizziness or syncope.   Pt denies polydipsia, polyuria, or new focal neuro s/s.    Pt denies fever, wt loss, night sweats, loss of appetite, or other constitutional symptoms  Also has unruly toenails with discomfort of several, Also has mild worsening blurred vision and has not yet been able to see optho - need referral again.  Also has reduced hearing and probable wax impactions again it seems.  Wt continue to worsen.  Due for flu shot       Wt Readings from Last 3 Encounters:  10/10/22 (!) 303 lb (137.4 kg)  04/18/22 292 lb (132.5 kg)  04/09/22 296 lb (134.3 kg)   BP Readings from Last 3 Encounters:  10/10/22 138/80  04/18/22 136/66  04/09/22 136/78         Past Medical History:  Diagnosis Date   Allergic rhinitis    Anemia of chronic disease    Anxiety    Asthma    Autism    Bladder 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/29/2011   Depression    Gallstone    GERD (gastroesophageal reflux disease)    Hyperlipidemia    Hypertension    IBS (irritable bowel syndrome)    Obesity    OSA (obstructive sleep apnea)    Pneumonia    UTI (urinary tract infection)    Past Surgical History:  Procedure Laterality Date   ORIF ANKLE FRACTURE Left 03/02/2015   Procedure: OPEN REDUCTION INTERNAL FIXATION (ORIF) LEFT ANKLE TRIMALLEOLAR FRACTURE;  Surgeon: Toni Arthurs, MD;  Location: [facility] OR;  Service: Orthopedics;  Laterality: Left;    reports that he has never smoked. He has never used smokeless tobacco. He reports that he does  not drink alcohol and does not use drugs. family history includes Alcohol abuse in an other family member; Arthritis in an other family member; Breast cancer in an other family member; Diabetes in an other family member; Heart disease in his father and another family member; Hyperlipidemia in an other family member; Irritable bowel syndrome in his mother; Kidney disease in an other family member; Pancreatic cancer in an other family member; Prostate cancer in an other family member; Stroke in an other family member. Allergies  Allergen Reactions   Fluoxetine Hcl Other (See Comments)    ineffective   Xanax [Alprazolam] Other (See Comments)    Made patient too sleepy   Current Outpatient Medications on File Prior to Visit  Medication Sig Dispense Refill   ammonium lactate (AMLACTIN) 12 % lotion Apply 1 Application topically as needed for dry skin. 400 g 0   aspirin EC 81 MG tablet Take 81 mg by mouth daily.     atorvastatin (LIPITOR) 40 MG tablet Take 1 tablet (40 mg total) by mouth daily. 90 tablet 3   buPROPion (WELLBUTRIN XL) 300 MG 24 hr tablet Take 1 tablet (300 mg total) by mouth daily. 90 tablet 3   clobetasol cream (TEMOVATE) 0.05 % Apply 1 Application topically 2 (  two) times daily.     clonazePAM (KLONOPIN) 0.5 MG tablet Take 1 tablet (0.5 mg total) by mouth 2 (two) times daily as needed for anxiety. 60 tablet 1   ELIQUIS 5 MG TABS tablet TAKE 1 TABLET BY MOUTH TWICE A DAY 180 tablet 3   halobetasol (ULTRAVATE) 0.05 % ointment Apply 1 application topically 2 (two) times daily.     hydroxypropyl methylcellulose / hypromellose (ISOPTO TEARS / GONIOVISC) 2.5 % ophthalmic solution Place 1 drop into both eyes 3 (three) times daily as needed for dry eyes.     lisinopril (ZESTRIL) 20 MG tablet TAKE 1 TABLET BY MOUTH EVERY DAY 90 tablet 3   metoprolol tartrate (LOPRESSOR) 50 MG tablet Take 1 tablet (50 mg total) by mouth 2 (two) times daily. 180 tablet 3   mupirocin ointment (BACTROBAN) 2 %  Apply 1 application topically 2 (two) times daily.     nystatin (MYCOSTATIN/NYSTOP) powder Use as directed twice daily as needed 45 g 3   ondansetron (ZOFRAN) 4 MG tablet Take 1 tablet (4 mg total) by mouth every 8 (eight) hours as needed for nausea or vomiting. 40 tablet 1   pantoprazole (PROTONIX) 40 MG tablet TAKE 1 TABLET BY MOUTH EVERY DAY 90 tablet 3   predniSONE (DELTASONE) 10 MG tablet 3 tabs by mouth per day for 3 days,2tabs per day for 3 days,1tab per day for 3 days 18 tablet 0   sertraline (ZOLOFT) 100 MG tablet TAKE 2 TABLETS BY MOUTH EVERY DAY 180 tablet 3   tamsulosin (FLOMAX) 0.4 MG CAPS capsule Take 1 capsule (0.4 mg total) by mouth 2 (two) times daily. (Patient taking differently: Take 0.4 mg by mouth daily.) 180 capsule 3   traZODone (DESYREL) 100 MG tablet TAKE 1 TABLET BY MOUTH EVERY DAY AT BEDTIME AS NEEDED FOR SLEEP 90 tablet 1   triamcinolone (NASACORT) 55 MCG/ACT AERO nasal inhaler Place 2 sprays into the nose daily. 1 Inhaler 12   triamcinolone cream (KENALOG) 0.1 % Apply 1 application. topically 2 (two) times daily. To legs for itching 453.6 g 0   furosemide (LASIX) 40 MG tablet Take 1 tablet (40 mg total) by mouth daily. 90 tablet 3   No current facility-administered medications on file prior to visit.        ROS:  All others reviewed and negative.  Objective        PE:  BP 138/80 (BP Location: Right Arm, Patient Position: Sitting, Cuff Size: Normal)   Pulse (!) 47   Temp 98.2 F (36.8 C) (Oral)   Ht 5\' 10"  (1.778 m)   Wt (!) 303 lb (137.4 kg)   SpO2 97%   BMI 43.48 kg/m                 Constitutional: Pt appears in NAD               HENT: Head: NCAT.                Right Ear: External ear normal.                 Left Ear: External ear normal.                Eyes: . Pupils are equal, round, and reactive to light. Conjunctivae and EOM are normal               Nose: without d/c or deformity  Neck: Neck supple. Gross normal ROM                Cardiovascular: Normal rate and regular rhythm.                 Pulmonary/Chest: Effort normal and breath sounds without rales or wheezing.                Abd:  Soft, NT, ND, + BS, no organomegaly               Neurological: Pt is alert. At baseline orientation, motor grossly intact               Skin: Skin is warm. No rashes, no other new lesions, LE edema - trace bilateral; left mid foot with plantar ganglion cyst first compartment               Psychiatric: Pt behavior is normal without agitation   Micro: none  Cardiac tracings I have personally interpreted today:  none  Pertinent Radiological findings (summarize): none   Lab Results  Component Value Date   WBC 6.6 04/09/2022   HGB 12.0 (L) 04/09/2022   HCT 36.8 (L) 04/09/2022   PLT 225.0 04/09/2022   GLUCOSE 95 10/10/2022   CHOL 168 10/10/2022   TRIG 108.0 10/10/2022   HDL 48.50 10/10/2022   LDLDIRECT 77.0 05/10/2019   LDLCALC 97 10/10/2022   ALT 23 10/10/2022   AST 24 10/10/2022   NA 143 10/10/2022   K 4.4 10/10/2022   CL 106 10/10/2022   CREATININE 1.74 (H) 10/10/2022   BUN 21 10/10/2022   CO2 30 10/10/2022   TSH <0.01 (L) 10/10/2022   PSA 0.52 04/09/2022   INR 1.05 03/02/2015   HGBA1C 5.7 10/10/2022   MICROALBUR 20.0 (H) 04/09/2022   Assessment/Plan:  Phillip Ratterree. is a 62 y.o. White or Caucasian [1] male with  has a past medical history of Allergic rhinitis, Anemia of chronic disease, Anxiety, Asthma, Autism, Bladder neck obstruction (05/29/2011), Depression, Gallstone, GERD (gastroesophageal reflux disease), Hyperlipidemia, Hypertension, IBS (irritable bowel syndrome), Obesity, OSA (obstructive sleep apnea), Pneumonia, and UTI (urinary tract infection).  Blurred vision, bilateral Etiology unclear, due for DM eye exam - for referral optho  Ganglion cyst of left foot Worsening recent months now tender swelling - for referral podiatry  Hyperthyroidism Lab Results  Component Value Date   TSH <0.01 (L)  10/10/2022   Pt encouraged to f/u endo as planned  - will try refer again  Nail disorder Also for podiatry referral  Type 2 diabetes mellitus with hyperglycemia, without long-term current use of insulin (HCC) Lab Results  Component Value Date   HGBA1C 5.7 10/10/2022   With obesity, pt to start mounjaro 2.5 mg weekly   Cerumen impaction Ceruminosis is noted.  Wax is removed by syringing and manual debridement. Instructions for home care to prevent wax buildup are given.  Followup: Return in about 6 months (around 04/09/2023).  Phillip Barre, MD 10/12/2022 1:38 PM  [facility]

## 2022-10-12 ENCOUNTER — Encounter: Payer: Self-pay | Admitting: Internal Medicine

## 2022-10-12 DIAGNOSIS — H538 Other visual disturbances: Secondary | ICD-10-CM | POA: Insufficient documentation

## 2022-10-12 DIAGNOSIS — M67472 Ganglion, left ankle and foot: Secondary | ICD-10-CM | POA: Insufficient documentation

## 2022-10-12 DIAGNOSIS — E1165 Type 2 diabetes mellitus with hyperglycemia: Secondary | ICD-10-CM | POA: Insufficient documentation

## 2022-10-12 NOTE — Assessment & Plan Note (Signed)
Also for podiatry referral

## 2022-10-12 NOTE — Assessment & Plan Note (Addendum)
Lab Results  Component Value Date   TSH <0.01 (L) 10/10/2022   Pt encouraged to f/u endo as planned  - will try refer again

## 2022-10-12 NOTE — Assessment & Plan Note (Signed)
Lab Results  Component Value Date   HGBA1C 5.7 10/10/2022   With obesity, pt to start mounjaro 2.5 mg weekly

## 2022-10-12 NOTE — Assessment & Plan Note (Signed)
Etiology unclear, due for DM eye exam - for referral optho

## 2022-10-12 NOTE — Assessment & Plan Note (Signed)
Ceruminosis is noted.  Wax is removed by syringing and manual debridement. Instructions for home care to prevent wax buildup are given.

## 2022-10-12 NOTE — Assessment & Plan Note (Signed)
Worsening recent months now tender swelling - for referral podiatry

## 2022-10-18 DIAGNOSIS — I5033 Acute on chronic diastolic (congestive) heart failure: Secondary | ICD-10-CM | POA: Diagnosis not present

## 2022-10-22 ENCOUNTER — Telehealth: Payer: Self-pay

## 2022-10-22 NOTE — Telephone Encounter (Signed)
Pharmacy Patient Advocate Encounter   Received notification from CoverMyMeds that prior authorization for [facility] 2.5MG /0.5ML pen-injectors is required/requested.   Insurance verification completed.   The patient is insured through [facility] .   Per test claim: PA required; PA submitted to [facility] via CoverMyMeds Key/confirmation #/EOC [id number] Status is pending

## 2022-10-23 ENCOUNTER — Other Ambulatory Visit (HOSPITAL_COMMUNITY): Payer: Self-pay

## 2022-10-23 NOTE — Telephone Encounter (Signed)
Pharmacy Patient Advocate Encounter  Received notification from [facility] that Prior Authorization for Mounjaro 2.5MG /0.5ML  has been APPROVED from 10/22/22 to 01/21/23. Ran test claim, Copay is $0. This test claim was processed through [facility]- copay amounts may vary at other pharmacies due to pharmacy/plan contracts, or as the patient moves through the different stages of their insurance plan.   PA #/Case ID/Reference #: [id number]

## 2022-11-17 DIAGNOSIS — I5033 Acute on chronic diastolic (congestive) heart failure: Secondary | ICD-10-CM | POA: Diagnosis not present

## 2022-12-18 DIAGNOSIS — I5033 Acute on chronic diastolic (congestive) heart failure: Secondary | ICD-10-CM | POA: Diagnosis not present

## 2023-01-06 ENCOUNTER — Ambulatory Visit (INDEPENDENT_AMBULATORY_CARE_PROVIDER_SITE_OTHER): Payer: 59 | Admitting: Podiatry

## 2023-01-06 ENCOUNTER — Encounter: Payer: Self-pay | Admitting: Podiatry

## 2023-01-06 DIAGNOSIS — E1165 Type 2 diabetes mellitus with hyperglycemia: Secondary | ICD-10-CM

## 2023-01-06 DIAGNOSIS — M79675 Pain in left toe(s): Secondary | ICD-10-CM

## 2023-01-06 DIAGNOSIS — B351 Tinea unguium: Secondary | ICD-10-CM

## 2023-01-06 DIAGNOSIS — M79674 Pain in right toe(s): Secondary | ICD-10-CM

## 2023-01-06 DIAGNOSIS — I739 Peripheral vascular disease, unspecified: Secondary | ICD-10-CM | POA: Diagnosis not present

## 2023-01-06 NOTE — Progress Notes (Signed)
This patient returns to my office for at risk foot care.  This patient requires this care by a professional since this patient will be at risk due to having  coagulation defect due to eliquis.  This patient is unable to cut nails himself since the patient cannot reach his nails.These nails are painful walking and wearing shoes.  This patient presents for at risk foot care today.  General Appearance  Alert, conversant and in no acute stress.  Vascular  Dorsalis pedis and posterior tibial  pulses are palpable  bilaterally.  Capillary return is within normal limits  bilaterally. Temperature is within normal limits  bilaterally.  Neurologic  Senn-Weinstein monofilament wire test within normal limits  bilaterally. Muscle power within normal limits bilaterally.  Nails Thick disfigured discolored nails with subungual debris  from hallux to fifth toes bilaterally. No evidence of bacterial infection or drainage bilaterally.  Orthopedic  No limitations of motion  feet .  No crepitus or effusions noted.  No bony pathology or digital deformities noted.  Skin  normotropic skin with no porokeratosis noted bilaterally.  No signs of infections or ulcers noted.     Onychomycosis  Pain in right toes  Pain in left toes  Consent was obtained for treatment procedures.   Mechanical debridement of nails 1-5  bilaterally performed with a nail nipper.  Filed with dremel without incident.    Return office visit  3 months                   Told patient to return for periodic foot care and evaluation due to potential at risk complications.   Mitzi Lilja DPM   

## 2023-01-17 DIAGNOSIS — I5033 Acute on chronic diastolic (congestive) heart failure: Secondary | ICD-10-CM | POA: Diagnosis not present

## 2023-01-27 ENCOUNTER — Other Ambulatory Visit (HOSPITAL_COMMUNITY): Payer: Self-pay

## 2023-02-17 DIAGNOSIS — I5033 Acute on chronic diastolic (congestive) heart failure: Secondary | ICD-10-CM | POA: Diagnosis not present

## 2023-02-26 ENCOUNTER — Other Ambulatory Visit: Payer: Self-pay | Admitting: Internal Medicine

## 2023-02-26 ENCOUNTER — Other Ambulatory Visit: Payer: Self-pay

## 2023-03-04 ENCOUNTER — Other Ambulatory Visit: Payer: Self-pay | Admitting: Internal Medicine

## 2023-03-04 ENCOUNTER — Other Ambulatory Visit: Payer: Self-pay

## 2023-03-10 ENCOUNTER — Telehealth: Payer: Self-pay | Admitting: Internal Medicine

## 2023-03-10 NOTE — Telephone Encounter (Signed)
 Copied from CRM [id number]. Topic: Clinical - Prescription Issue >> Mar 10, 2023  9:22 AM Theodis Sato wrote: Reason for CRM: Patients brother in law is requesting Dr. Jonny Ruiz to fax a oxygen supply machine equipment re certification to [phone]

## 2023-03-10 NOTE — Telephone Encounter (Signed)
 Hmmmm - I think most often we get a request from the oxygen company for recertification, so [place] taking the initiative usually is not needed.  Perhaps the caller or pt could ask the oxygen company to send a recertification request to [place], or to the original provider if it was not [place] (such as pulmonary)    thanks

## 2023-03-12 NOTE — Telephone Encounter (Signed)
 Called and left voice mail

## 2023-03-20 DIAGNOSIS — I5033 Acute on chronic diastolic (congestive) heart failure: Secondary | ICD-10-CM | POA: Diagnosis not present

## 2023-03-21 ENCOUNTER — Telehealth: Payer: Self-pay | Admitting: Internal Medicine

## 2023-03-21 NOTE — Telephone Encounter (Signed)
 That would be fine, in addition to otc allegra 180 mg per day , and consider being seen sooner for worsening fever, pain or congestion or pain,   thanks

## 2023-03-21 NOTE — Telephone Encounter (Signed)
 Copied from CRM [id number]. Topic: Clinical - Medical Advice >> Mar 21, 2023  8:07 AM Deaijah H wrote: Reason for CRM: Patients brother in law called in stating patient is having sinus issues, dry cough stated would like to know if patient can take tylenol until he's seen 3/03. Please call [phone]

## 2023-03-24 ENCOUNTER — Ambulatory Visit: Payer: 59 | Admitting: Family Medicine

## 2023-04-08 ENCOUNTER — Ambulatory Visit: Payer: 59

## 2023-04-17 DIAGNOSIS — I5033 Acute on chronic diastolic (congestive) heart failure: Secondary | ICD-10-CM | POA: Diagnosis not present

## 2023-05-05 ENCOUNTER — Ambulatory Visit (INDEPENDENT_AMBULATORY_CARE_PROVIDER_SITE_OTHER): Admitting: Internal Medicine

## 2023-05-05 ENCOUNTER — Encounter: Payer: Self-pay | Admitting: Internal Medicine

## 2023-05-05 VITALS — BP 140/84 | HR 55 | Temp 98.1°F | Ht 70.0 in | Wt 321.0 lb

## 2023-05-05 DIAGNOSIS — E538 Deficiency of other specified B group vitamins: Secondary | ICD-10-CM | POA: Diagnosis not present

## 2023-05-05 DIAGNOSIS — Z7985 Long-term (current) use of injectable non-insulin antidiabetic drugs: Secondary | ICD-10-CM

## 2023-05-05 DIAGNOSIS — E559 Vitamin D deficiency, unspecified: Secondary | ICD-10-CM

## 2023-05-05 DIAGNOSIS — Z0001 Encounter for general adult medical examination with abnormal findings: Secondary | ICD-10-CM

## 2023-05-05 DIAGNOSIS — E7800 Pure hypercholesterolemia, unspecified: Secondary | ICD-10-CM

## 2023-05-05 DIAGNOSIS — Z125 Encounter for screening for malignant neoplasm of prostate: Secondary | ICD-10-CM

## 2023-05-05 DIAGNOSIS — Z1211 Encounter for screening for malignant neoplasm of colon: Secondary | ICD-10-CM

## 2023-05-05 DIAGNOSIS — H6123 Impacted cerumen, bilateral: Secondary | ICD-10-CM | POA: Diagnosis not present

## 2023-05-05 DIAGNOSIS — M5432 Sciatica, left side: Secondary | ICD-10-CM | POA: Diagnosis not present

## 2023-05-05 DIAGNOSIS — E1165 Type 2 diabetes mellitus with hyperglycemia: Secondary | ICD-10-CM

## 2023-05-05 DIAGNOSIS — I10 Essential (primary) hypertension: Secondary | ICD-10-CM | POA: Diagnosis not present

## 2023-05-05 DIAGNOSIS — R739 Hyperglycemia, unspecified: Secondary | ICD-10-CM

## 2023-05-05 MED ORDER — TIRZEPATIDE 5 MG/0.5ML ~~LOC~~ SOAJ: 5.0000 mg | SUBCUTANEOUS | 3 refills | Status: DC

## 2023-05-05 NOTE — Patient Instructions (Addendum)
 Your right ear was cleared of wax today  Please have the Tdap tetanus shot, Shingrix and Prevnar 20 shots done at the pharmacy  Ok to increase the mounjaro to 5 mg once weekly  Please continue all other medications as before, and refills have been done if requested.  Please have the pharmacy call with any other refills you may need.  Please continue your efforts at being more active, low cholesterol diet, and weight control.  You are otherwise up to date with prevention measures today.  Please keep your appointments with your specialists as you may have planned  You will be contacted regarding the referral for: colonoscopy, and eye doctor  Please go to the LAB at the blood drawing area for the tests to be done  You will be contacted by phone if any changes need to be made immediately.  Otherwise, you will receive a letter about your results with an explanation, but please check with MyChart first.  Please make an Appointment to return in 6 months, or sooner if needed

## 2023-05-05 NOTE — Progress Notes (Unsigned)
 Chief Complaint:: wellness exam and dm, right ear pain and bilateral cerumen impaction, dCHF, left sciatica, hld, hyperglycemia       HPI:  Phillip Sydnor. is a 63 y.o. male here for wellness exam; for prevnar 20 and shingrix at pharmacy, accepts colonoscopy and eye referrals, o/w up to date                        Also has had recent left lower back pain with radiation to the leg, but resolved in the past few days.  Pt denies chest pain, increased sob or doe, wheezing, orthopnea, PND, increased LE swelling, palpitations, dizziness or syncope.   Pt denies polydipsia, polyuria, or new focal neuro s/s.    Pt denies fever, wt loss, night sweats, loss of appetite, or other constitutional symptoms  Does also have reduced hearing with    Wt Readings from Last 3 Encounters:  05/06/23 (!) 321 lb (145.6 kg)  05/05/23 (!) 321 lb (145.6 kg)  10/10/22 (!) 303 lb (137.4 kg)   BP Readings from Last 3 Encounters:  05/05/23 (!) 140/84  10/10/22 138/80  04/18/22 136/66   Immunization History  Administered Date(s) Administered   Fluad Quad(high Dose 65+) 01/03/2022   Influenza, Quadrivalent, Recombinant, Inj, Pf 01/28/2018   Influenza, Seasonal, Injecte, Preservative Fre 10/10/2022   Influenza,inj,Quad PF,6+ Mos 02/20/2014, 10/23/2016, 02/26/2017, 09/23/2017, 09/29/2018, 03/04/2019, 03/07/2020, 11/15/2020, 03/05/2021   Influenza-Unspecified 01/28/2018   PPD Test 03/06/2015   Td 01/22/2004   Tdap 02/21/2014, 07/31/2017   Health Maintenance Due  Topic Date Due   OPHTHALMOLOGY EXAM  Never done   Pneumococcal Vaccine 68-5 Years old (1 of 2 - PCV) Never done   Colonoscopy  Never done   Zoster Vaccines- Shingrix (1 of 2) Never done   Diabetic kidney evaluation - Urine ACR  04/09/2023   HEMOGLOBIN A1C  04/09/2023      Past Medical History:  Diagnosis Date   Allergic rhinitis    Anemia of chronic disease    Anxiety    Asthma    Autism    Bladder 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/29/2011   Depression     Gallstone    GERD (gastroesophageal reflux disease)    Hyperlipidemia    Hypertension    IBS (irritable bowel syndrome)    Obesity    OSA (obstructive sleep apnea)    Pneumonia    UTI (urinary tract infection)    Past Surgical History:  Procedure Laterality Date   ORIF ANKLE FRACTURE Left 03/02/2015   Procedure: OPEN REDUCTION INTERNAL FIXATION (ORIF) LEFT ANKLE TRIMALLEOLAR FRACTURE;  Surgeon: Toni Arthurs, MD;  Location: [facility] OR;  Service: Orthopedics;  Laterality: Left;    reports that he has never smoked. He has never used smokeless tobacco. He reports that he does not drink alcohol and does not use drugs. family history includes Alcohol abuse in an other family member; Arthritis in an other family member; Breast cancer in an other family member; Diabetes in an other family member; Heart disease in his father and another family member; Hyperlipidemia in an other family member; Irritable bowel syndrome in his mother; Kidney disease in an other family member; Pancreatic cancer in an other family member; Prostate cancer in an other family member; Stroke in an other family member. Allergies  Allergen Reactions   Fluoxetine Hcl Other (See Comments)    ineffective   Xanax [Alprazolam] Other (See Comments)    Made patient  too sleepy   Current Outpatient Medications on File Prior to Visit  Medication Sig Dispense Refill   albuterol (VENTOLIN HFA) 108 (90 Base) MCG/ACT inhaler Inhale 2 puffs into the lungs every 6 (six) hours as needed for wheezing or shortness of breath. 3 each 3   ammonium lactate (AMLACTIN) 12 % lotion Apply 1 Application topically as needed for dry skin. 400 g 0   aspirin EC 81 MG tablet Take 81 mg by mouth daily.     atorvastatin (LIPITOR) 40 MG tablet Take 1 tablet (40 mg total) by mouth daily. 90 tablet 3   buPROPion (WELLBUTRIN XL) 300 MG 24 hr tablet Take 1 tablet (300 mg total) by mouth daily. 90 tablet 3   clobetasol cream (TEMOVATE) 0.05 % Apply 1 Application  topically 2 (two) times daily.     clonazePAM (KLONOPIN) 0.5 MG tablet Take 1 tablet (0.5 mg total) by mouth 2 (two) times daily as needed for anxiety. 60 tablet 1   ELIQUIS 5 MG TABS tablet TAKE 1 TABLET BY MOUTH TWICE A DAY 180 tablet 3   gabapentin (NEURONTIN) 300 MG capsule Take 2 capsules (600 mg total) by mouth at bedtime. 180 capsule 1   halobetasol (ULTRAVATE) 0.05 % ointment Apply 1 application topically 2 (two) times daily.     hydroxypropyl methylcellulose / hypromellose (ISOPTO TEARS / GONIOVISC) 2.5 % ophthalmic solution Place 1 drop into both eyes 3 (three) times daily as needed for dry eyes.     lisinopril (ZESTRIL) 20 MG tablet TAKE 1 TABLET BY MOUTH EVERY DAY 90 tablet 3   metoprolol tartrate (LOPRESSOR) 50 MG tablet Take 1 tablet (50 mg total) by mouth 2 (two) times daily. 180 tablet 3   mupirocin ointment (BACTROBAN) 2 % Apply 1 application topically 2 (two) times daily.     nystatin (MYCOSTATIN/NYSTOP) powder Use as directed twice daily as needed 45 g 3   ondansetron (ZOFRAN) 4 MG tablet Take 1 tablet (4 mg total) by mouth every 8 (eight) hours as needed for nausea or vomiting. 40 tablet 1   pantoprazole (PROTONIX) 40 MG tablet TAKE 1 TABLET BY MOUTH EVERY DAY 90 tablet 3   predniSONE (DELTASONE) 10 MG tablet 3 tabs by mouth per day for 3 days,2tabs per day for 3 days,1tab per day for 3 days 18 tablet 0   sertraline (ZOLOFT) 100 MG tablet TAKE 2 TABLETS BY MOUTH EVERY DAY 180 tablet 3   tamsulosin (FLOMAX) 0.4 MG CAPS capsule Take 1 capsule (0.4 mg total) by mouth 2 (two) times daily. (Patient taking differently: Take 0.4 mg by mouth daily.) 180 capsule 3   traZODone (DESYREL) 100 MG tablet TAKE 1 TABLET BY MOUTH EVERY DAY AT BEDTIME AS NEEDED FOR SLEEP 90 tablet 1   triamcinolone (NASACORT) 55 MCG/ACT AERO nasal inhaler Place 2 sprays into the nose daily. 1 Inhaler 12   triamcinolone cream (KENALOG) 0.1 % Apply 1 application. topically 2 (two) times daily. To legs for itching  453.6 g 0   furosemide (LASIX) 40 MG tablet Take 1 tablet (40 mg total) by mouth daily. 90 tablet 3   No current facility-administered medications on file prior to visit.        ROS:  All others reviewed and negative.  Objective        PE:  BP (!) 140/84 (BP Location: Left Arm, Patient Position: Sitting, Cuff Size: Normal)   Pulse (!) 55   Temp 98.1 F (36.7 C) (Oral)   Ht 5\' 10"  (  1.778 m)   Wt (!) 321 lb (145.6 kg)   SpO2 96%   BMI 46.06 kg/m                 Constitutional: Pt appears in NAD               HENT: Head: NCAT.                Right Ear: External ear normal.                 Left Ear: External ear normal. Bilateral cerumen impactions resolved               Eyes: . Pupils are equal, round, and reactive to light. Conjunctivae and EOM are normal               Nose: without d/c or deformity               Neck: Neck supple. Gross normal ROM               Cardiovascular: Normal rate and regular rhythm.                 Pulmonary/Chest: Effort normal and breath sounds without rales or wheezing.                Abd:  Soft, NT, ND, + BS, no organomegaly               Neurological: Pt is alert. At baseline orientation, motor grossly intact               Skin: Skin is warm. No rashes, no other new lesions, LE edema - trace bilateral               Psychiatric: Pt behavior is normal without agitation   Micro: none  Cardiac tracings I have personally interpreted today:  none  Pertinent Radiological findings (summarize): none   Lab Results  Component Value Date   WBC 6.6 04/09/2022   HGB 12.0 (L) 04/09/2022   HCT 36.8 (L) 04/09/2022   PLT 225.0 04/09/2022   GLUCOSE 95 10/10/2022   CHOL 168 10/10/2022   TRIG 108.0 10/10/2022   HDL 48.50 10/10/2022   LDLDIRECT 77.0 05/10/2019   LDLCALC 97 10/10/2022   ALT 23 10/10/2022   AST 24 10/10/2022   NA 143 10/10/2022   K 4.4 10/10/2022   CL 106 10/10/2022   CREATININE 1.74 (H) 10/10/2022   BUN 21 10/10/2022   CO2 30  10/10/2022   TSH <0.01 (L) 10/10/2022   PSA 0.52 04/09/2022   INR 1.05 03/02/2015   HGBA1C 5.7 10/10/2022   MICROALBUR 20.0 (H) 04/09/2022   Assessment/Plan:  Phillip Mcbee. is a 63 y.o. White or Caucasian [1] male with  has a past medical history of Allergic rhinitis, Anemia of chronic disease, Anxiety, Asthma, Autism, Bladder neck obstruction (05/29/2011), Depression, Gallstone, GERD (gastroesophageal reflux disease), Hyperlipidemia, Hypertension, IBS (irritable bowel syndrome), Obesity, OSA (obstructive sleep apnea), Pneumonia, and UTI (urinary tract infection).  Encounter for well adult exam with abnormal findings Age and sex appropriate education and counseling updated with regular exercise and diet Referrals for preventative services - for colonoscopy and optho referral Immunizations addressed - for prevnar 20 and shingrx at pharmacy Smoking counseling  - none needed Evidence for depression or other mood disorder - none significant Most recent labs reviewed. I have personally reviewed and have noted: 1) the patient's medical and social history  2) The patient's current medications and supplements 3) The patient's height, weight, and BMI have been recorded in the chart   Essential hypertension BP Readings from Last 3 Encounters:  05/05/23 (!) 140/84  10/10/22 138/80  04/18/22 136/66   uncontrolled, pt to continue medical treatment lisinopril 20 every day, decliens change   Hyperlipidemia Lab Results  Component Value Date   LDLCALC 97 10/10/2022   uncontrolled, pt to continue current statin lipitior 40 mg every day declines chang for now, for f/u lab   Cerumen impaction Bilateral impaactions resolved, but has persistent wax to right ear with discomfort, for ENT referral  Left sided sciatica Mild intermittent, exam benign, ok to follow  Type 2 diabetes mellitus with hyperglycemia, without long-term current use of insulin (HCC) Lab Results  Component Value Date    HGBA1C 5.7 10/10/2022   With uncontrolled obesity, pt to increase mounjaro 5 mg weekly  Followup: Return in about 6 months (around 11/04/2023).  Rosalia Colonel, MD 05/06/2023 8:43 PM [facility]  Primary Care - [facility]

## 2023-05-06 ENCOUNTER — Other Ambulatory Visit: Payer: Self-pay | Admitting: Internal Medicine

## 2023-05-06 ENCOUNTER — Ambulatory Visit

## 2023-05-06 ENCOUNTER — Encounter: Payer: Self-pay | Admitting: Internal Medicine

## 2023-05-06 VITALS — Ht 70.0 in | Wt 321.0 lb

## 2023-05-06 DIAGNOSIS — M5432 Sciatica, left side: Secondary | ICD-10-CM | POA: Insufficient documentation

## 2023-05-06 DIAGNOSIS — Z0000 Encounter for general adult medical examination without abnormal findings: Secondary | ICD-10-CM | POA: Diagnosis not present

## 2023-05-06 DIAGNOSIS — R2681 Unsteadiness on feet: Secondary | ICD-10-CM | POA: Diagnosis not present

## 2023-05-06 DIAGNOSIS — H9202 Otalgia, left ear: Secondary | ICD-10-CM

## 2023-05-06 DIAGNOSIS — E559 Vitamin D deficiency, unspecified: Secondary | ICD-10-CM | POA: Insufficient documentation

## 2023-05-06 NOTE — Assessment & Plan Note (Signed)
 BP Readings from Last 3 Encounters:  05/05/23 (!) 140/84  10/10/22 138/80  04/18/22 136/66   uncontrolled, pt to continue medical treatment lisinopril 20 every day, decliens change

## 2023-05-06 NOTE — Assessment & Plan Note (Signed)
 Lab Results  Component Value Date   LDLCALC 97 10/10/2022   uncontrolled, pt to continue current statin lipitior 40 mg every day declines chang for now, for f/u lab

## 2023-05-06 NOTE — Assessment & Plan Note (Signed)
 Bilateral impaactions resolved, but has persistent wax to right ear with discomfort, for ENT referral

## 2023-05-06 NOTE — Assessment & Plan Note (Signed)
 Mild intermittent, exam benign, ok to follow

## 2023-05-06 NOTE — Progress Notes (Signed)
 Subjective:   Phillip Butler. is a 63 y.o. who presents for a Medicare Wellness preventive visit.  Visit Complete: Virtual I connected with  [name]. on 05/06/23 by a audio enabled telemedicine application and verified that I am speaking with the correct person using two identifiers.  Patient Location: Home  Provider Location: Home Office  I discussed the limitations of evaluation and management by telemedicine. The patient expressed understanding and agreed to proceed.  Vital Signs: Because this visit was a virtual/telehealth visit, some criteria may be missing or patient reported. Any vitals not documented were not able to be obtained and vitals that have been documented are patient reported.  VideoDeclined- This patient declined Librarian, academic. Therefore the visit was completed with audio only.  Persons Participating in Visit: Patient.  AWV Questionnaire: No: Patient Medicare AWV questionnaire was not completed prior to this visit.  Cardiac Risk Factors include: advanced age (>65men, >62 women);hypertension;male gender;Other (see comment);dyslipidemia;diabetes mellitus, Risk factor comments: CHF, OSA,     Objective:    Today's Vitals   05/06/23 1357  Weight: (!) 321 lb (145.6 kg)  Height: 5\' 10"  (1.778 m)   Body mass index is 46.06 kg/m.     05/06/2023    2:32 PM 08/31/2020    2:32 PM 07/11/2020   11:50 AM 01/25/2020    2:03 AM 12/24/2019    2:31 AM 02/12/2019    8:18 PM 01/29/2016    7:44 PM  Advanced Directives  Does Patient Have a Medical Advance Directive? No No No -- No No No  Would patient like information on creating a medical advance directive?  No - Patient declined No - Patient declined  No - Patient declined No - Patient declined     Current Medications (verified) Outpatient Encounter Medications as of 05/06/2023  Medication Sig   albuterol (VENTOLIN HFA) 108 (90 Base) MCG/ACT inhaler Inhale 2 puffs into the lungs  every 6 (six) hours as needed for wheezing or shortness of breath.   ammonium lactate (AMLACTIN) 12 % lotion Apply 1 Application topically as needed for dry skin.   aspirin EC 81 MG tablet Take 81 mg by mouth daily.   atorvastatin (LIPITOR) 40 MG tablet Take 1 tablet (40 mg total) by mouth daily.   buPROPion (WELLBUTRIN XL) 300 MG 24 hr tablet Take 1 tablet (300 mg total) by mouth daily.   clobetasol cream (TEMOVATE) 0.05 % Apply 1 Application topically 2 (two) times daily.   clonazePAM (KLONOPIN) 0.5 MG tablet Take 1 tablet (0.5 mg total) by mouth 2 (two) times daily as needed for anxiety.   ELIQUIS 5 MG TABS tablet TAKE 1 TABLET BY MOUTH TWICE A DAY   gabapentin (NEURONTIN) 300 MG capsule Take 2 capsules (600 mg total) by mouth at bedtime.   halobetasol (ULTRAVATE) 0.05 % ointment Apply 1 application topically 2 (two) times daily.   hydroxypropyl methylcellulose / hypromellose (ISOPTO TEARS / GONIOVISC) 2.5 % ophthalmic solution Place 1 drop into both eyes 3 (three) times daily as needed for dry eyes.   lisinopril (ZESTRIL) 20 MG tablet TAKE 1 TABLET BY MOUTH EVERY DAY   metoprolol tartrate (LOPRESSOR) 50 MG tablet Take 1 tablet (50 mg total) by mouth 2 (two) times daily.   mupirocin ointment (BACTROBAN) 2 % Apply 1 application topically 2 (two) times daily.   nystatin (MYCOSTATIN/NYSTOP) powder Use as directed twice daily as needed   ondansetron (ZOFRAN) 4 MG tablet Take 1 tablet (4 mg  total) by mouth every 8 (eight) hours as needed for nausea or vomiting.   pantoprazole (PROTONIX) 40 MG tablet TAKE 1 TABLET BY MOUTH EVERY DAY   predniSONE (DELTASONE) 10 MG tablet 3 tabs by mouth per day for 3 days,2tabs per day for 3 days,1tab per day for 3 days   sertraline (ZOLOFT) 100 MG tablet TAKE 2 TABLETS BY MOUTH EVERY DAY   tamsulosin (FLOMAX) 0.4 MG CAPS capsule Take 1 capsule (0.4 mg total) by mouth 2 (two) times daily. (Patient taking differently: Take 0.4 mg by mouth daily.)   tirzepatide  (MOUNJARO) 5 MG/0.5ML Pen Inject 5 mg into the skin once a week.   traZODone (DESYREL) 100 MG tablet TAKE 1 TABLET BY MOUTH EVERY DAY AT BEDTIME AS NEEDED FOR SLEEP   triamcinolone (NASACORT) 55 MCG/ACT AERO nasal inhaler Place 2 sprays into the nose daily.   triamcinolone cream (KENALOG) 0.1 % Apply 1 application. topically 2 (two) times daily. To legs for itching   furosemide (LASIX) 40 MG tablet Take 1 tablet (40 mg total) by mouth daily.   No facility-administered encounter medications on file as of 05/06/2023.    Allergies (verified) Fluoxetine hcl and Xanax [alprazolam]   History: Past Medical History:  Diagnosis Date   Allergic rhinitis    Anemia of chronic disease    Anxiety    Asthma    Autism    Bladder 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/29/2011   Depression    Gallstone    GERD (gastroesophageal reflux disease)    Hyperlipidemia    Hypertension    IBS (irritable bowel syndrome)    Obesity    OSA (obstructive sleep apnea)    Pneumonia    UTI (urinary tract infection)    Past Surgical History:  Procedure Laterality Date   ORIF ANKLE FRACTURE Left 03/02/2015   Procedure: OPEN REDUCTION INTERNAL FIXATION (ORIF) LEFT ANKLE TRIMALLEOLAR FRACTURE;  Surgeon: Amada Backer, MD;  Location: [facility] OR;  Service: Orthopedics;  Laterality: Left;   Family History  Problem Relation Age of Onset   Irritable bowel syndrome Mother    Heart disease Father    Alcohol abuse Other    Arthritis Other    Breast cancer Other    Prostate cancer Other    Hyperlipidemia Other    Heart disease Other    Stroke Other    Kidney disease Other    Diabetes Other    Pancreatic cancer Other    Colon cancer Neg Hx    Esophageal cancer Neg Hx    Social History   Socioeconomic History   Marital status: Single    Spouse name: Not on file   Number of children: Not on file   Years of education: Not on file   Highest education level: Not on file  Occupational History   Occupation: RETIRED  Tobacco Use   Smoking  status: Never   Smokeless tobacco: Never  Vaping Use   Vaping status: Never Used  Substance and Sexual Activity   Alcohol use: No   Drug use: No   Sexual activity: Not on file  Other Topics Concern   Not on file  Social History Narrative   Lives with brother in law   Social Drivers of Health   Financial Resource Strain: Low Risk  (05/06/2023)   Overall Financial Resource Strain (CARDIA)    Difficulty of Paying Living Expenses: Not very hard  Food Insecurity: No Food Insecurity (05/06/2023)   Hunger Vital Sign    Worried About Running  Out of Food in the Last Year: Never true    Ran Out of Food in the Last Year: Never true  Transportation Needs: No Transportation Needs (05/06/2023)   PRAPARE - Administrator, Civil Service (Medical): No    Lack of Transportation (Non-Medical): No  Physical Activity: Inactive (05/06/2023)   Exercise Vital Sign    Days of Exercise per Week: 0 days    Minutes of Exercise per Session: 0 min  Stress: No Stress Concern Present (05/06/2023)   [employer] of Occupational Health - Occupational Stress Questionnaire    Feeling of Stress : Not at all  Social Connections: Socially Isolated (05/06/2023)   Social Connection and Isolation Panel [NHANES]    Frequency of Communication with Friends and Family: Once a week    Frequency of Social Gatherings with Friends and Family: Never    Attends Religious Services: Never    Database administrator or Organizations: No    Attends Engineer, structural: Never    Marital Status: Never married    Tobacco Counseling Counseling given: Not Answered    Clinical Intake:  Pre-visit preparation completed: Yes  Pain : No/denies pain     BMI - recorded: 46.06 Nutritional Status: BMI > 30  Obese Nutritional Risks: None Diabetes: Yes CBG done?: No Did pt. bring in CBG monitor from home?: No  Lab Results  Component Value Date   HGBA1C 5.7 10/10/2022   HGBA1C 5.8 04/09/2022   HGBA1C  5.6 01/03/2022     How often do you need to have someone help you when you read instructions, pamphlets, or other written materials from your doctor or pharmacy?: 1 - Never  Interpreter Needed?: No  Comments: Brother in law helped with visit Information entered by :: Dametra Whetsel, RMA   Activities of Daily Living     05/06/2023    1:57 PM  In your present state of health, do you have any difficulty performing the following activities:  Hearing? 0  Vision? 0  Difficulty concentrating or making decisions? 0  Walking or climbing stairs? 0  Dressing or bathing? 0  Doing errands, shopping? 0  Comment brother in law drives him  Preparing Food and eating ? N  Using the Toilet? N  In the past six months, have you accidently leaked urine? N  Do you have problems with loss of bowel control? N  Managing your Medications? N  Managing your Finances? N  Housekeeping or managing your Housekeeping? N    Patient Care Team: Corwin Levins, MD as PCP - General  Indicate any recent Medical Services you may have received from other than [facility] providers in the past year (date may be approximate).     Assessment:   This is a routine wellness examination for [place].  Hearing/Vision screen Hearing Screening - Comments:: Denies hearing difficulties   Vision Screening - Comments:: Denies vision issues.    Goals Addressed   None    Depression Screen Patient refused questions    05/06/2023    2:39 PM 05/05/2023    1:25 PM 10/10/2022    1:54 PM 04/09/2022    3:14 PM 01/03/2022    2:22 PM 08/13/2021    3:10 PM 08/31/2020    2:31 PM  PHQ 2/9 Scores  PHQ - 2 Score  0 0 1 0 1 2  PHQ- 9 Score     0    Exception Documentation Patient refusal  Fall Risk     05/06/2023    2:33 PM 05/05/2023    1:29 PM 10/10/2022    1:54 PM 04/09/2022    3:13 PM 01/03/2022    2:22 PM  Fall Risk   Falls in the past year? 1 0 0 0 0  Number falls in past yr: 0 0 0 0   Injury with Fall? 0 0 0 0 0   Risk for fall due to :  No Fall Risks No Fall Risks  No Fall Risks  Follow up Falls evaluation completed;Falls prevention discussed Falls evaluation completed Falls evaluation completed  Falls evaluation completed    MEDICARE RISK AT HOME:  Medicare Risk at Home Any stairs in or around the home?: No If so, are there any without handrails?: No Home free of loose throw rugs in walkways, pet beds, electrical cords, etc?: Yes Adequate lighting in your home to reduce risk of falls?: Yes Life alert?: No Use of a cane, walker or w/c?: No Grab bars in the bathroom?: Yes Shower chair or bench in shower?: Yes Elevated toilet seat or a handicapped toilet?: No  TIMED UP AND GO:  Was the test performed?  No  Cognitive Function: Impaired: Cognitive status assessed by direct observation. Patient has current diagnosis of cognitive impairment.  Autism          Immunizations Immunization History  Administered Date(s) Administered   Fluad Quad(high Dose 65+) 01/03/2022   Influenza, Quadrivalent, Recombinant, Inj, Pf 01/28/2018   Influenza, Seasonal, Injecte, Preservative Fre 10/10/2022   Influenza,inj,Quad PF,6+ Mos 02/20/2014, 10/23/2016, 02/26/2017, 09/23/2017, 09/29/2018, 03/04/2019, 03/07/2020, 11/15/2020, 03/05/2021   Influenza-Unspecified 01/28/2018   PPD Test 03/06/2015   Td 01/22/2004   Tdap 02/21/2014, 07/31/2017    Screening Tests Health Maintenance  Topic Date Due   OPHTHALMOLOGY EXAM  Never done   Pneumococcal Vaccine 55-63 Years old (1 of 2 - PCV) Never done   Colonoscopy  Never done   Zoster Vaccines- Shingrix (1 of 2) Never done   Medicare Annual Wellness (AWV)  08/31/2021   Diabetic kidney evaluation - Urine ACR  04/09/2023   HEMOGLOBIN A1C  04/09/2023   INFLUENZA VACCINE  08/22/2023   Diabetic kidney evaluation - eGFR measurement  10/10/2023   FOOT EXAM  05/04/2024   DTaP/Tdap/Td (4 - Td or Tdap) 08/01/2027   Hepatitis C Screening  Completed   HIV Screening   Completed   HPV VACCINES  Aged Out   Meningococcal B Vaccine  Aged Out   COVID-19 Vaccine  Discontinued    Health Maintenance  Health Maintenance Due  Topic Date Due   OPHTHALMOLOGY EXAM  Never done   Pneumococcal Vaccine 78-8 Years old (1 of 2 - PCV) Never done   Colonoscopy  Never done   Zoster Vaccines- Shingrix (1 of 2) Never done   Medicare Annual Wellness (AWV)  08/31/2021   Diabetic kidney evaluation - Urine ACR  04/09/2023   HEMOGLOBIN A1C  04/09/2023   Health Maintenance Items Addressed: Referral sent to GI for colonoscopy, Referral sent for Diabetic Retinal Eye Exam, See Nurse Notes  Additional Screening:  Vision Screening: Recommended annual ophthalmology exams for early detection of glaucoma and other disorders of the eye.  Dental Screening: Recommended annual dental exams for proper oral hygiene  Community Resource Referral / Chronic Care Management: CRR required this visit?  No   CCM required this visit?  No     Plan:     I have personally reviewed and noted the following  in the patient's chart:   Medical and social history Use of alcohol, tobacco or illicit drugs  Current medications and supplements including opioid prescriptions. Patient is not currently taking opioid prescriptions. Functional ability and status Nutritional status Physical activity Advanced directives List of other physicians Hospitalizations, surgeries, and ER visits in previous 12 months Vitals Screenings to include cognitive, depression, and falls Referrals and appointments  In addition, I have reviewed and discussed with patient certain preventive protocols, quality metrics, and best practice recommendations. A written personalized care plan for preventive services as well as general preventive health recommendations were provided to patient.     Axton Cihlar L Zari Cly, CMA   05/06/2023   After Visit Summary: (Mail) Due to this being a telephonic visit, the after visit summary  with patients personalized plan was offered to patient via mail   Notes: Please refer to Routing Comments.

## 2023-05-06 NOTE — Assessment & Plan Note (Signed)
 Last vitamin D Lab Results  Component Value Date   VD25OH <7.00 (L) 04/09/2022   Low, to start oral replacement

## 2023-05-06 NOTE — Assessment & Plan Note (Addendum)
 Lab Results  Component Value Date   HGBA1C 5.7 10/10/2022   With uncontrolled obesity, pt to increase mounjaro 5 mg weekly

## 2023-05-06 NOTE — Patient Instructions (Addendum)
 Mr. Phillip Butler , Thank you for taking time to come for your Medicare Wellness Visit. I appreciate your ongoing commitment to your health goals. Please review the following plan we discussed and let me know if I can assist you in the future.   Referrals/Orders/Follow-Ups/Clinician Recommendations: It was nice talking to you and your brother in law today.  You are due for a Shingles vaccine and a Pneumonia vaccine, which you can get at the pharmacy.  Remember to call  [facility], at [phone] and also [address], [place] at [phone], to schedule your colonoscopy and diabetic eye exam.     This is a list of the screening recommended for you and due dates:  Health Maintenance  Topic Date Due   Eye exam for diabetics  Never done   Pneumococcal Vaccination (1 of 2 - PCV) Never done   Colon Cancer Screening  Never done   Zoster (Shingles) Vaccine (1 of 2) Never done   Medicare Annual Wellness Visit  08/31/2021   Yearly kidney health urinalysis for diabetes  04/09/2023   Hemoglobin A1C  04/09/2023   Flu Shot  08/22/2023   Yearly kidney function blood test for diabetes  10/10/2023   Complete foot exam   05/04/2024   DTaP/Tdap/Td vaccine (4 - Td or Tdap) 08/01/2027   Hepatitis C Screening  Completed   HIV Screening  Completed   HPV Vaccine  Aged Out   Meningitis B Vaccine  Aged Out   COVID-19 Vaccine  Discontinued    Advanced directives: (Declined) Advance directive discussed with you today. Even though you declined this today, please call our office should you change your mind, and we can give you the proper paperwork for you to fill out.  Next Medicare Annual Wellness Visit scheduled for next year: Yes

## 2023-05-06 NOTE — Assessment & Plan Note (Addendum)
 Age and sex appropriate education and counseling updated with regular exercise and diet Referrals for preventative services - for colonoscopy and optho referral Immunizations addressed - for prevnar 20 and shingrx at pharmacy Smoking counseling  - none needed Evidence for depression or other mood disorder - none significant Most recent labs reviewed. I have personally reviewed and have noted: 1) the patient's medical and social history 2) The patient's current medications and supplements 3) The patient's height, weight, and BMI have been recorded in the chart

## 2023-05-07 ENCOUNTER — Telehealth: Payer: Self-pay | Admitting: Internal Medicine

## 2023-05-07 DIAGNOSIS — M67472 Ganglion, left ankle and foot: Secondary | ICD-10-CM

## 2023-05-07 DIAGNOSIS — L84 Corns and callosities: Secondary | ICD-10-CM

## 2023-05-07 DIAGNOSIS — R2681 Unsteadiness on feet: Secondary | ICD-10-CM

## 2023-05-07 DIAGNOSIS — M722 Plantar fascial fibromatosis: Secondary | ICD-10-CM

## 2023-05-07 DIAGNOSIS — J9611 Chronic respiratory failure with hypoxia: Secondary | ICD-10-CM

## 2023-05-07 DIAGNOSIS — E1165 Type 2 diabetes mellitus with hyperglycemia: Secondary | ICD-10-CM

## 2023-05-07 DIAGNOSIS — M5432 Sciatica, left side: Secondary | ICD-10-CM

## 2023-05-07 NOTE — Telephone Encounter (Signed)
 Ok this order is done hardcopy to cma

## 2023-05-07 NOTE — Telephone Encounter (Signed)
 Copied from CRM [id number]. Topic: Clinical - Medication Question >> May 06, 2023  4:16 PM [place] J wrote: Reason for CRM: Patient's brother in law wondering if he could have a physical prescription order for a "Lift Chair" to help him when he gets up, sits down, and rests. He stated that the medical supplier would be able to provide 10% off with a physical order.  Patient's brother in law would like a call back.  Callback Number: [phone]

## 2023-05-08 ENCOUNTER — Telehealth: Payer: Self-pay | Admitting: Internal Medicine

## 2023-05-08 ENCOUNTER — Telehealth: Payer: Self-pay | Admitting: *Deleted

## 2023-05-08 MED ORDER — ONETOUCH VERIO VI STRP: ORAL_STRIP | 12 refills | Status: DC

## 2023-05-08 MED ORDER — LANCETS MISC: 3 refills | Status: AC

## 2023-05-08 MED ORDER — ONETOUCH VERIO FLEX SYSTEM W/DEVICE KIT: PACK | 0 refills | Status: AC

## 2023-05-08 NOTE — Progress Notes (Signed)
 Complex Care Management Note Care Guide Note  05/08/2023 Name: Phillip Butler. MRN: [id number] DOB: Jun 02, 1960   Complex Care Management Outreach Attempts: An unsuccessful telephone outreach was attempted today to offer the patient information about available complex care management services.  Follow Up Plan:  Additional outreach attempts will be made to offer the patient complex care management information and services.   Encounter Outcome:  No Answer  Kandis Ormond, CMA [place]  [facility], [facility] Guide Direct Dial: [phone]  Fax: [phone] Website: [email or web address]

## 2023-05-08 NOTE — Telephone Encounter (Signed)
 Called and let Pt brother know it is ready for pick up.

## 2023-05-08 NOTE — Telephone Encounter (Signed)
 Copied from CRM [id number]. Topic: Clinical - Medication Question >> May 08, 2023 12:21 PM Melissa C wrote: Reason for CRM: Patient's brother in law called as he was advised when he talked about coming to pick up the lift chair to call the patient's insurance company to see what Glucose Monitoring system they would cover for his brother in law and call back so doctor could prescribe.    They advised One Touch Verio Flex Glucose Monitoring System. Patient's brother in law would like a call back to know if this will be prescribed and if he would pick this up at clinic or at pharmacy. Thank you. [phone]

## 2023-05-08 NOTE — Telephone Encounter (Signed)
 Ok the scripts are done erx

## 2023-05-09 NOTE — Progress Notes (Signed)
 Complex Care Management Note  Care Guide Note 05/09/2023 Name: Phillip Butler. MRN: [id number] DOB: Apr 23, 1960  Phillip Butler. is a 63 y.o. year old male who sees Roslyn Coombe, MD for primary care. I reached out to Phillip Butler. by phone today to offer complex care management services.  [name] was given information about Complex Care Management services today including:   The Complex Care Management services include support from the care team which includes your Nurse Care Manager, Clinical Social Worker, or Pharmacist.  The Complex Care Management team is here to help remove barriers to the health concerns and goals most important to you. Complex Care Management services are voluntary, and the patient may decline or stop services at any time by request to their care team member.   Complex Care Management Consent Status: Patient agreed to services and verbal consent obtained.   Follow up plan:  Telephone appointment with complex care management team member scheduled for:  05/22/2023  Encounter Outcome:  Patient Scheduled  Kandis Ormond, CMA [place]  [facility], [facility] Guide Direct Dial: [phone]  Fax: [phone] Website: [email or web address]

## 2023-05-12 NOTE — Telephone Encounter (Signed)
 Called and let him know

## 2023-05-18 DIAGNOSIS — I5033 Acute on chronic diastolic (congestive) heart failure: Secondary | ICD-10-CM | POA: Diagnosis not present

## 2023-05-22 ENCOUNTER — Other Ambulatory Visit: Payer: Self-pay | Admitting: *Deleted

## 2023-05-22 NOTE — Patient Instructions (Signed)
 Visit Information  Thank you for taking time to visit with me today. Please don't hesitate to contact me if I can be of assistance to you before our next scheduled appointment.  Our next appointment is by telephone on 06/12/23 at 10am Please call the care guide team at [phone] if you need to cancel or reschedule your appointment.   Following is a copy of your care plan:   Goals Addressed             This Visit's Progress    VBCI Social Work Care Plan       Problems:   Lacks knowledge of how to connect to local day programs. Also requesting resources for a lift chair. Patient has  No Advanced Directives in place  CSW Clinical Goal(s):   Over the next 90 days the Caregiver will work with Child psychotherapist to address concerns related to resources to obtain a lift chair as well as available Adult Day programs. Advanced Directive packet also mailed out to patient's caregiver for review  .  Interventions:  Level of Care Concerns in a patient with DMII Current level of care: Home with other family or significant other(s): family member: brother in Production assistant, radio  Evaluation of patient's unmet needs in current living environment ADL's Assessed needs, level of care concerns, how currently meeting needs and barriers to care Confirmed that patient's brother in law provides assistance with patient's overall care Confirmed interest in Adult Day Programs to increase patient's  socialization  Dicussed request for resources to obtain a lift chair due to difficulty standing  Advance Directive Advance directive packet mailed    Patient Goals/Self-Care Activities:  Call local senior center to obtain information on senior activities             Call [facility] to discuss any possible coverage for lift chair   Plan:   Telephone follow up appointment with care management team member scheduled for:  06/12/23        Please call 911 if you are experiencing a Mental Health or Behavioral Health Crisis  or need someone to talk to.  Patient verbalizes understanding of instructions and care plan provided today and agrees to view in MyChart. Active MyChart status and patient understanding of how to access instructions and care plan via MyChart confirmed with patient.     Heath Badon, LCSW   [facility], [facility] Licensed Clinical Social Worker Care Coordinator  Direct Dial: [phone]

## 2023-05-22 NOTE — Patient Outreach (Signed)
 Complex Care Management   Visit Note  05/22/2023  Name:  Phillip Butler. MRN: [id number] DOB: 1960-09-22  Situation: Referral received for Complex Care Management related to  DME needs, adult day care options and advanced directive completion.  I obtained verbal consent from Caregiver.  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   Depression    Gallstone    GERD (gastroesophageal reflux disease)    Hyperlipidemia    Hypertension    IBS (irritable bowel syndrome)    Obesity    OSA (obstructive sleep apnea)    Pneumonia    UTI (urinary tract infection)     Assessment: Patient Reported Symptoms:  Cognitive Cognitive Status: Able to follow simple commands, Alert and oriented to person, place, and time Cognitive/Intellectual Conditions Management [RPT]: Intellectual Disability Intellectual Disability: Sometimes when talking with someone does not want to talk   Health Maintenance Behaviors: Annual physical exam, Hobbies, Spiritual practice(s) Healing Pattern: Average Health Facilitated by: Healthy diet, Prayer/meditation  Neurological Neurological Review of Symptoms: No symptoms reported    HEENT HEENT Symptoms Reported: No symptoms reported HEENT Conditions: Ear problem(s) (wax buiild up) HEENT Management Strategies: Medical device HEENT Self-Management Outcome: 4 (good) HEENT Comment: per brother in law wax removed from right ear Ear problem(s) (wax buiild up)  Cardiovascular Does patient have uncontrolled Hypertension?: No Cardiovascular Conditions: Hypertension, Heart failure Cardiovascular Management Strategies: Diet modification, Medication therapy, Activity Weight: (!) 321 lb (145.6 kg) Cardiovascular Self-Management Outcome: 3 (uncertain) Cardiovascular Comment: patient on oxygen  -brother  in law reports that he encourages excercise  Respiratory Respiratory Symptoms Reported: No symptoms reported    Endocrine Patient reports the following symptoms related to hypoglycemia or hyperglycemia : No symptoms reported Is patient diabetic?: Yes Is patient checking blood sugars at home?: No (has machine but cannot figure out to use it , going to [facility] today to check blood sugar and ask for assistance) Endocrine Conditions: Diabetes Endocrine Management Strategies: Diet modification, Exercise, Medication therapy Endocrine Self-Management Outcome: 4 (good)  Gastrointestinal Gastrointestinal Symptoms Reported: Constipation Additional Gastrointestinal Details: may be side affect of medications, increased fiber and water Gastrointestinal Conditions: Constipation Nutrition Risk Screen (CP): No indicators present  Genitourinary Genitourinary Symptoms Reported: Not assessed    Integumentary Integumentary Symptoms Reported: Not assessed    Musculoskeletal Musculoskelatal Symptoms Reviewed: Weakness Additional Musculoskeletal Details: has diffculty getting in van-has a step to help him get in- may be due to his weight gain also has difficulty standing Musculoskeletal Conditions: Unsteady gait Musculoskeletal Management Strategies: Medication therapy Musculoskeletal Comment: hx of foot fracture Falls in the past year?: Yes Number of falls in past year: 1 or less Was there an injury with Fall?: No Fall Risk Category Calculator: 1 Patient Fall Risk Level: Low Fall Risk Patient at Risk for Falls Due to: Impaired mobility  Psychosocial Psychosocial Symptoms Reported: No symptoms reported     Quality of Family Relationships: supportive Do you feel physically threatened by others?: No      05/22/2023    9:38 AM  Depression screen PHQ 2/9  Decreased Interest 0  Down, Depressed, Hopeless 0  PHQ - 2 Score 0    There were no vitals filed for this visit.  Medications Reviewed Today      Reviewed by  Phillip Butler, [place] M, [place] (Child psychotherapist) on 05/22/23 at [phone]  Med List Status: <None>   Medication Order Taking? Sig Documenting Provider Last Dose Status Informant  albuterol  (VENTOLIN  HFA) 108 (90 Base) MCG/ACT inhaler [id number] Yes Inhale 2 puffs into the lungs every 6 (six) hours as needed for wheezing or shortness of breath. Phillip Coombe, MD Taking Active   ammonium lactate  (AMLACTIN) 12 % lotion [id number] Yes Apply 1 Application topically as needed for dry skin. Phillip Butler, DPM Taking Active   aspirin  EC 81 MG tablet [id number] Yes Take 81 mg by mouth daily. [provider] Taking Active Self  atorvastatin  (LIPITOR) 40 MG tablet [id number] Yes Take 1 tablet (40 mg total) by mouth daily. Phillip Coombe, MD Taking Active Self  Blood Glucose Monitoring Suppl [facility] VERIO FLEX SYSTEM) w/Device Phillip Butler [id number] Yes Use as directed four times per day E11.9 Phillip Coombe, MD Taking Active   buPROPion  (WELLBUTRIN  XL) 300 MG 24 hr tablet [id number] Yes Take 1 tablet (300 mg total) by mouth daily. Phillip Coombe, MD Taking Active Self  clobetasol cream (TEMOVATE) 0.05 % [id number] Yes Apply 1 Application topically 2 (two) times daily. [provider] Taking Active   clonazePAM  (KLONOPIN ) 0.5 MG tablet [id number] Yes Take 1 tablet (0.5 mg total) by mouth 2 (two) times daily as needed for anxiety. Phillip Coombe, MD Taking Active   ELIQUIS  5 MG TABS tablet [id number] Yes TAKE 1 TABLET BY MOUTH TWICE A DAY Phillip Coombe, MD Taking Active   furosemide  (LASIX ) 40 MG tablet [id number]  Take 1 tablet (40 mg total) by mouth daily. Phillip Coombe, MD  Expired 07/11/20 2359 Self  gabapentin  (NEURONTIN ) 300 MG capsule [id number] Yes Take 2 capsules (600 mg total) by mouth at bedtime. Phillip Coombe, MD Taking Active   glucose blood [facility] VERIO) test strip [id number] Yes Use as instructed four times per day E11.9 Phillip Coombe, MD Taking Active   halobetasol (ULTRAVATE) 0.05 % ointment  [id number] Yes Apply 1 application topically 2 (two) times daily. [provider] Taking Active Self  hydroxypropyl methylcellulose / hypromellose (ISOPTO TEARS / GONIOVISC) 2.5 % ophthalmic solution [id number] Yes Place 1 drop into both eyes 3 (three) times daily as needed for dry eyes. [provider] Taking Active   Lancets MISC [id number] Yes Use as directed four times per day E11.9 Phillip Coombe, MD Taking Active   lisinopril  (ZESTRIL ) 20 MG tablet [id number] Yes TAKE 1 TABLET BY MOUTH EVERY DAY Phillip Coombe, MD Taking Active   metoprolol  tartrate (LOPRESSOR ) 50 MG tablet [id number] Yes Take 1 tablet (50 mg total) by mouth 2 (two) times daily. Phillip Coombe, MD Taking Active   mupirocin ointment (BACTROBAN) 2 % [id number] Yes Apply 1 application topically 2 (two) times daily. [provider] Taking Active Self  nystatin  (MYCOSTATIN /NYSTOP ) powder [id number] Yes Use as directed twice daily as needed Phillip Coombe, MD Taking Active   ondansetron  (ZOFRAN ) 4 MG tablet [id number] Yes Take 1 tablet (4 mg total) by mouth every 8 (eight) hours as needed for nausea or vomiting. Phillip Coombe, MD Taking Active   pantoprazole  (PROTONIX ) 40 MG tablet [id number] Yes TAKE 1 TABLET BY MOUTH EVERY DAY Phillip Coombe, MD Taking Active   predniSONE  (DELTASONE ) 10 MG tablet [id number] Yes 3 tabs by mouth per day for 3 days,2tabs per day for 3 days,1tab per day for 3 days Phillip Coombe, MD Taking Active   sertraline  (  ZOLOFT ) 100 MG tablet [id number] Yes TAKE 2 TABLETS BY MOUTH EVERY DAY Phillip Coombe, MD Taking Active   tamsulosin  (FLOMAX ) 0.4 MG CAPS capsule [id number] Yes Take 1 capsule (0.4 mg total) by mouth 2 (two) times daily.  Patient taking differently: Take 0.4 mg by mouth daily.   Phillip Coombe, MD Taking Active   tirzepatide  (MOUNJARO ) 5 MG/0.5ML Pen [id number] Yes Inject 5 mg into the skin once a week. Phillip Coombe, MD Taking Active   traZODone  (DESYREL ) 100 MG tablet [id number] Yes TAKE 1  TABLET BY MOUTH EVERY DAY AT BEDTIME AS NEEDED FOR SLEEP Phillip Coombe, MD Taking Active   triamcinolone  (NASACORT ) 55 MCG/ACT AERO nasal inhaler [id number] Yes Place 2 sprays into the nose daily. Phillip Coombe, MD Taking Active Self  triamcinolone  cream (KENALOG ) 0.1 % [id number] Yes Apply 1 application. topically 2 (two) times daily. To legs for itching Phillip Coombe, MD Taking Active             Recommendation:   DME requests:  other lift chair-prescription received, resources to be provided  Follow Up Plan:   Telephone follow-up 06/12/23  Phillip Adolphus, LCSW [place]  [facility], [facility] Licensed Clinical Social Worker Care Coordinator  Direct Dial: [phone]

## 2023-06-04 ENCOUNTER — Other Ambulatory Visit: Payer: Self-pay

## 2023-06-04 ENCOUNTER — Other Ambulatory Visit: Payer: Self-pay | Admitting: Internal Medicine

## 2023-06-12 ENCOUNTER — Other Ambulatory Visit: Payer: Self-pay | Admitting: *Deleted

## 2023-06-13 NOTE — Patient Outreach (Signed)
 Complex Care Management   Visit Note  06/13/2023  Name:  Phillip Butler. MRN: [id number] DOB: [birth year]  Situation: Referral received for Complex Care Management related to DME needs, adult day care options and advanced directive completion.  I obtained verbal consent from Caregiver.  Visit completed with caregiver  on the phone on 06/12/23  Background:   Past Medical History:  Diagnosis Date   Allergic rhinitis    Anemia of chronic disease    Anxiety    Asthma    Autism    Bladder 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/29/2011   Depression    Gallstone    GERD (gastroesophageal reflux disease)    Hyperlipidemia    Hypertension    IBS (irritable bowel syndrome)    Obesity    OSA (obstructive sleep apnea)    Pneumonia    UTI (urinary tract infection)     Assessment: Patient Reported Symptoms:  Cognitive Cognitive Status: Able to follow simple commands, Alert and oriented to person, place, and time (per SIL) Cognitive/Intellectual Conditions Management [RPT]: Intellectual Disability   Health Maintenance Behaviors: Annual physical exam, Hobbies, Spiritual practice(s) Health Facilitated by: Healthy diet, Prayer/meditation  Neurological Neurological Review of Symptoms: No symptoms reported    HEENT HEENT Symptoms Reported: No symptoms reported      Cardiovascular Cardiovascular Symptoms Reported: No symptoms reported    Respiratory Respiratory Symptoms Reported: No symptoms reported    Endocrine Patient reports the following symptoms related to hypoglycemia or hyperglycemia : No symptoms reported Is patient diabetic?: Yes Is patient checking blood sugars at home?: Yes (now checks daily, [facility] was given teaching on how to use monitor) Endocrine Conditions: Diabetes Endocrine Management Strategies: Diet modification, Exercise, Medication therapy Endocrine Self-Management Outcome: 4 (good)  Gastrointestinal Gastrointestinal Symptoms Reported: Constipation Gastrointestinal  Conditions: Constipation Gastrointestinal Comment: constipation has resolved due to change in diet( eating more fiber and water) Nutrition Risk Screen (CP): No indicators present  Genitourinary Genitourinary Symptoms Reported: No symptoms reported    Integumentary Integumentary Symptoms Reported: No symptoms reported    Musculoskeletal Musculoskelatal Symptoms Reviewed: Weakness Additional Musculoskeletal Details: weakness improving-very focused on losing weight-weight loss has led to improvement in mobilty-patient taking vitamins,which has seemed to increase his energy per SIL Musculoskeletal Conditions: Unsteady gait Musculoskeletal Management Strategies: Medication therapy, Exercise Musculoskeletal Comment: hx of foot fracture,      Psychosocial              05/22/2023    9:38 AM  Depression screen PHQ 2/9  Decreased Interest 0  Down, Depressed, Hopeless 0  PHQ - 2 Score 0    There were no vitals filed for this visit.  Medications Reviewed Today     Reviewed by Ave Leisure, LCSW (Social Worker) on 06/12/23 at 1039  Med List Status: <None>   Medication Order Taking? Sig Documenting Provider Last Dose Status Informant  albuterol  (VENTOLIN  HFA) 108 (90 Base) MCG/ACT inhaler [id number] No Inhale 2 puffs into the lungs every 6 (six) hours as needed for wheezing or shortness of breath. Roslyn Coombe, MD Taking Active   ammonium lactate  (AMLACTIN) 12 % lotion [id number] No Apply 1 Application topically as needed for dry skin. Velma Ghazi, DPM Taking Active   aspirin  EC 81 MG tablet [id number] No Take 81 mg by mouth daily. [provider] Taking Active Self  atorvastatin  (LIPITOR) 40 MG tablet [id number] No Take 1 tablet (40 mg total) by mouth daily. Roslyn Coombe, MD Taking Active Self  Blood Glucose Monitoring Suppl (ONETOUCH VERIO FLEX SYSTEM) w/Device KIT [id number] No Use as directed four times per day E11.9 Roslyn Coombe, MD Taking Active   buPROPion  (WELLBUTRIN  XL)  300 MG 24 hr tablet [id number] No Take 1 tablet (300 mg total) by mouth daily. Roslyn Coombe, MD Taking Active Self  clobetasol cream (TEMOVATE) 0.05 % [id number] No Apply 1 Application topically 2 (two) times daily. [provider] Taking Active   clonazePAM  (KLONOPIN ) 0.5 MG tablet [id number] No Take 1 tablet (0.5 mg total) by mouth 2 (two) times daily as needed for anxiety. Roslyn Coombe, MD Taking Active   ELIQUIS  5 MG TABS tablet [id number] No TAKE 1 TABLET BY MOUTH TWICE A DAY Roslyn Coombe, MD Taking Active   furosemide  (LASIX ) 40 MG tablet [id number] No Take 1 tablet (40 mg total) by mouth daily. Roslyn Coombe, MD 07/10/2020 Expired 07/11/20 2359 Self  gabapentin  (NEURONTIN ) 300 MG capsule [id number] No Take 2 capsules (600 mg total) by mouth at bedtime. Roslyn Coombe, MD Taking Active   glucose blood [facility] VERIO) test strip [id number] No Use as instructed four times per day E11.9 Roslyn Coombe, MD Taking Active   halobetasol (ULTRAVATE) 0.05 % ointment [id number] No Apply 1 application topically 2 (two) times daily. [provider] Taking Active Self  hydroxypropyl methylcellulose / hypromellose (ISOPTO TEARS / GONIOVISC) 2.5 % ophthalmic solution [id number] No Place 1 drop into both eyes 3 (three) times daily as needed for dry eyes. [provider] Taking Active   Lancets MISC [id number] No Use as directed four times per day E11.9 Roslyn Coombe, MD Taking Active   lisinopril  (ZESTRIL ) 20 MG tablet [id number] No TAKE 1 TABLET BY MOUTH EVERY DAY Roslyn Coombe, MD Taking Active   metoprolol  tartrate (LOPRESSOR ) 50 MG tablet [id number]  TAKE 1 TABLET BY MOUTH TWICE A DAY Roslyn Coombe, MD  Active   mupirocin ointment (BACTROBAN) 2 % [id number] No Apply 1 application topically 2 (two) times daily. [provider] Taking Active Self  nystatin  (MYCOSTATIN /NYSTOP ) powder [id number] No Use as directed twice daily as needed Roslyn Coombe, MD Taking Active   ondansetron  (ZOFRAN )  4 MG tablet [id number] No Take 1 tablet (4 mg total) by mouth every 8 (eight) hours as needed for nausea or vomiting. Roslyn Coombe, MD Taking Active   pantoprazole  (PROTONIX ) 40 MG tablet [id number] No TAKE 1 TABLET BY MOUTH EVERY DAY Roslyn Coombe, MD Taking Active   predniSONE  (DELTASONE ) 10 MG tablet [id number] No 3 tabs by mouth per day for 3 days,2tabs per day for 3 days,1tab per day for 3 days Roslyn Coombe, MD Taking Active   sertraline  (ZOLOFT ) 100 MG tablet [id number] No TAKE 2 TABLETS BY MOUTH EVERY DAY Roslyn Coombe, MD Taking Active   tamsulosin  (FLOMAX ) 0.4 MG CAPS capsule [id number] No Take 1 capsule (0.4 mg total) by mouth 2 (two) times daily.  Patient taking differently: Take 0.4 mg by mouth daily.   Roslyn Coombe, MD Taking Active   tirzepatide  (MOUNJARO ) 5 MG/0.5ML Pen [id number] No Inject 5 mg into the skin once a week. Roslyn Coombe, MD Taking Active   traZODone  (DESYREL ) 100 MG tablet [id number] No TAKE 1 TABLET BY MOUTH EVERY DAY AT BEDTIME AS NEEDED FOR SLEEP Roslyn Coombe, MD Taking Active   triamcinolone  (NASACORT ) 55 MCG/ACT AERO nasal inhaler [id number] No Place 2 sprays into the nose daily. Roslyn Coombe, MD Taking Active Self  triamcinolone  cream (KENALOG ) 0.1 % [id number] No Apply 1 application. topically 2 (two) times daily. To legs for itching Roslyn Coombe, MD Taking Active             Recommendation:   PCP Follow-up  Follow Up Plan:   Telephone follow-up 06/26/23  Michaelle Adolphus, LCSW   [facility], [facility] Licensed Clinical Social Worker Care Coordinator  Direct Dial: [phone]

## 2023-06-13 NOTE — Patient Instructions (Signed)
 Visit Information  Thank you for taking time to visit with me today. Please don't hesitate to contact me if I can be of assistance to you before our next scheduled appointment.  Your next care management appointment is by telephone on 06/26/23 at 9:30am  Telephone follow-up 06/26/23  Please call the care guide team at [phone] if you need to cancel, schedule, or reschedule an appointment.   Please call 911 if you are experiencing a Mental Health or Behavioral Health Crisis or need someone to talk to.  Drue Harr, LCSW [place]  [facility], [facility] Licensed Clinical Social Worker Care Coordinator  Direct Dial: [phone]

## 2023-06-17 DIAGNOSIS — I5033 Acute on chronic diastolic (congestive) heart failure: Secondary | ICD-10-CM | POA: Diagnosis not present

## 2023-06-23 ENCOUNTER — Telehealth: Payer: Self-pay | Admitting: *Deleted

## 2023-06-26 ENCOUNTER — Telehealth: Admitting: *Deleted

## 2023-06-27 ENCOUNTER — Telehealth: Payer: Self-pay | Admitting: *Deleted

## 2023-06-30 ENCOUNTER — Other Ambulatory Visit: Payer: Self-pay | Admitting: *Deleted

## 2023-06-30 NOTE — Patient Instructions (Signed)
 Visit Information  Thank you for taking time to visit with me today. Please don't hesitate to contact me if I can be of assistance to you before our next scheduled appointment.  Your next care management appointment is by telephone on 07/16/23 at 10am  Telephone follow-up 2 weeks  Please call the care guide team at [phone] if you need to cancel, schedule, or reschedule an appointment.   Please call 911 if you are experiencing a Mental Health or Behavioral Health Crisis or need someone to talk to.  Phillip Pargas, LCSW [place]  [facility], [facility] Licensed Clinical Social Worker  Direct Dial: [phone]

## 2023-06-30 NOTE — Patient Outreach (Signed)
 Complex Care Management   Visit Note  06/30/2023  Name:  Phillip Butler. MRN: [id number] DOB: July 11, 1960  Situation:Referral received for Complex Care Management related to DME needs, adult day care options and advanced directive completion.  I obtained verbal consent from Caregiver.  Visit completed with caregiver  on the phone.   Background:   Past Medical History:  Diagnosis Date   Allergic rhinitis    Anemia of chronic disease    Anxiety    Asthma    Autism    Bladder 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/29/2011   Depression    Gallstone    GERD (gastroesophageal reflux disease)    Hyperlipidemia    Hypertension    IBS (irritable bowel syndrome)    Obesity    OSA (obstructive sleep apnea)    Pneumonia    UTI (urinary tract infection)     Assessment: Patient Reported Symptoms:  Cognitive Cognitive Status: Able to follow simple commands, Alert and oriented to person, place, and time (per SIL) Cognitive/Intellectual Conditions Management [RPT]: Intellectual Disability   Health Maintenance Behaviors: Annual physical exam, Hobbies, Spiritual practice(s) Healing Pattern: Average Health Facilitated by: Healthy diet, Prayer/meditation  Neurological Neurological Review of Symptoms: No symptoms reported    HEENT HEENT Symptoms Reported: No symptoms reported      Cardiovascular Cardiovascular Symptoms Reported: No symptoms reported Does patient have uncontrolled Hypertension?: No Cardiovascular Conditions: Hypertension, Heart failure Cardiovascular Management Strategies: Diet modification, Medication therapy, Exercise, Activity  Respiratory Respiratory Symptoms Reported: No symptoms reported    Endocrine Patient reports the following symptoms related to hypoglycemia or hyperglycemia : No symptoms reported Is patient diabetic?: Yes Is patient checking blood sugars at home?: Yes Endocrine Conditions: Diabetes Endocrine Management Strategies: Diet modification, Exercise, Medication  therapy Endocrine Self-Management Outcome: 4 (good)  Gastrointestinal Gastrointestinal Symptoms Reported: No symptoms reported      Genitourinary Genitourinary Symptoms Reported: No symptoms reported    Integumentary Integumentary Symptoms Reported: No symptoms reported    Musculoskeletal Musculoskelatal Symptoms Reviewed: Weakness Additional Musculoskeletal Details: weakness continues to improve-continued focus on weight loss-excercise daily (15 min per date) socialization improving-going to church  regularly Musculoskeletal Conditions: Unsteady gait Musculoskeletal Management Strategies: Coping strategies, Medication therapy, Exercise      Psychosocial Psychosocial Symptoms Reported: No symptoms reported            05/22/2023    9:38 AM  Depression screen PHQ 2/9  Decreased Interest 0  Down, Depressed, Hopeless 0  PHQ - 2 Score 0    There were no vitals filed for this visit.  Medications Reviewed Today     Reviewed by Ave Leisure, LCSW (Social Worker) on 06/30/23 at 1447  Med List Status: <None>   Medication Order Taking? Sig Documenting Provider Last Dose Status Informant  albuterol  (VENTOLIN  HFA) 108 (90 Base) MCG/ACT inhaler [id number] Yes Inhale 2 puffs into the lungs every 6 (six) hours as needed for wheezing or shortness of breath. Roslyn Coombe, MD Taking Active   ammonium lactate  [facility]) 12 % lotion [id number] Yes Apply 1 Application topically as needed for dry skin. Velma Ghazi, DPM Taking Active   aspirin  EC 81 MG tablet [id number] Yes Take 81 mg by mouth daily. [provider] Taking Active Self  atorvastatin  (LIPITOR) 40 MG tablet [id number] Yes Take 1 tablet (40 mg total) by mouth daily. Roslyn Coombe, MD Taking Active Self  Blood Glucose Monitoring Suppl [facility] VERIO FLEX SYSTEM) w/Device Suzanne Erps [id number] Yes Use as directed four times per day  E11.9 Roslyn Coombe, MD Taking Active   buPROPion  (WELLBUTRIN  XL) 300 MG 24 hr tablet [id number] Yes Take  1 tablet (300 mg total) by mouth daily. Roslyn Coombe, MD Taking Active Self  clobetasol cream (TEMOVATE) 0.05 % [id number] Yes Apply 1 Application topically 2 (two) times daily. [provider] Taking Active   clonazePAM  (KLONOPIN ) 0.5 MG tablet [id number] Yes Take 1 tablet (0.5 mg total) by mouth 2 (two) times daily as needed for anxiety. Roslyn Coombe, MD Taking Active   ELIQUIS  5 MG TABS tablet [id number] Yes TAKE 1 TABLET BY MOUTH TWICE A DAY Roslyn Coombe, MD Taking Active   furosemide  (LASIX ) 40 MG tablet [id number]  Take 1 tablet (40 mg total) by mouth daily. Roslyn Coombe, MD  Expired 07/11/20 2359 Self  gabapentin  (NEURONTIN ) 300 MG capsule [id number] Yes Take 2 capsules (600 mg total) by mouth at bedtime. Roslyn Coombe, MD Taking Active   glucose blood [facility] VERIO) test strip [id number] Yes Use as instructed four times per day E11.9 Roslyn Coombe, MD Taking Active   halobetasol (ULTRAVATE) 0.05 % ointment [id number] Yes Apply 1 application topically 2 (two) times daily. [provider] Taking Active Self  hydroxypropyl methylcellulose / hypromellose (ISOPTO TEARS / GONIOVISC) 2.5 % ophthalmic solution [id number]  Place 1 drop into both eyes 3 (three) times daily as needed for dry eyes. [provider]  Active   Lancets MISC [id number] Yes Use as directed four times per day E11.9 Roslyn Coombe, MD Taking Active   lisinopril  (ZESTRIL ) 20 MG tablet [id number] Yes TAKE 1 TABLET BY MOUTH EVERY DAY Roslyn Coombe, MD Taking Active   metoprolol  tartrate (LOPRESSOR ) 50 MG tablet [id number] Yes TAKE 1 TABLET BY MOUTH TWICE A DAY Roslyn Coombe, MD Taking Active   mupirocin ointment (BACTROBAN) 2 % [id number] Yes Apply 1 application topically 2 (two) times daily. [provider] Taking Active Self  nystatin  (MYCOSTATIN /NYSTOP ) powder [id number] Yes Use as directed twice daily as needed Roslyn Coombe, MD Taking Active   ondansetron  (ZOFRAN ) 4 MG tablet [id number] Yes Take 1  tablet (4 mg total) by mouth every 8 (eight) hours as needed for nausea or vomiting. Roslyn Coombe, MD Taking Active   pantoprazole  (PROTONIX ) 40 MG tablet [id number] Yes TAKE 1 TABLET BY MOUTH EVERY DAY Roslyn Coombe, MD Taking Active   predniSONE  (DELTASONE ) 10 MG tablet [id number] Yes 3 tabs by mouth per day for 3 days,2tabs per day for 3 days,1tab per day for 3 days Roslyn Coombe, MD Taking Active   sertraline  (ZOLOFT ) 100 MG tablet [id number] Yes TAKE 2 TABLETS BY MOUTH EVERY DAY Roslyn Coombe, MD Taking Active   tamsulosin  (FLOMAX ) 0.4 MG CAPS capsule [id number] Yes Take 1 capsule (0.4 mg total) by mouth 2 (two) times daily.  Patient taking differently: Take 0.4 mg by mouth daily.   Roslyn Coombe, MD Taking Active   tirzepatide  (MOUNJARO ) 5 MG/0.5ML Pen [id number] Yes Inject 5 mg into the skin once a week. Roslyn Coombe, MD Taking Active   traZODone  (DESYREL ) 100 MG tablet [id number] Yes TAKE 1 TABLET BY MOUTH EVERY DAY AT BEDTIME AS NEEDED FOR SLEEP Roslyn Coombe, MD Taking Active   triamcinolone  (NASACORT ) 55 MCG/ACT AERO nasal inhaler [id number] Yes Place 2 sprays into the nose daily. Roslyn Coombe, MD Taking Active Self  triamcinolone  cream (KENALOG ) 0.1 % [id number] Yes Apply 1 application. topically 2 (two) times daily. To legs for  itching Roslyn Coombe, MD Taking Active             Recommendation:   PCP Follow-up Continue Current Plan of Care  Follow Up Plan:   Telephone follow-up 07/16/23  Michaelle Adolphus, LCSW [address]  [facility], [facility] Licensed Clinical Social Worker  Direct Dial: [phone]

## 2023-07-09 DIAGNOSIS — H401131 Primary open-angle glaucoma, bilateral, mild stage: Secondary | ICD-10-CM | POA: Diagnosis not present

## 2023-07-09 DIAGNOSIS — E119 Type 2 diabetes mellitus without complications: Secondary | ICD-10-CM | POA: Diagnosis not present

## 2023-07-09 LAB — HM DIABETES EYE EXAM

## 2023-07-11 ENCOUNTER — Ambulatory Visit: Admitting: Podiatry

## 2023-07-14 ENCOUNTER — Ambulatory Visit (INDEPENDENT_AMBULATORY_CARE_PROVIDER_SITE_OTHER): Admitting: Podiatry

## 2023-07-14 ENCOUNTER — Encounter: Payer: Self-pay | Admitting: Podiatry

## 2023-07-14 DIAGNOSIS — M2042 Other hammer toe(s) (acquired), left foot: Secondary | ICD-10-CM | POA: Diagnosis not present

## 2023-07-14 DIAGNOSIS — B351 Tinea unguium: Secondary | ICD-10-CM

## 2023-07-14 DIAGNOSIS — M2041 Other hammer toe(s) (acquired), right foot: Secondary | ICD-10-CM | POA: Diagnosis not present

## 2023-07-15 ENCOUNTER — Other Ambulatory Visit: Payer: Self-pay | Admitting: Internal Medicine

## 2023-07-15 NOTE — Progress Notes (Signed)
 Subjective:   Patient ID: Phillip Butler., male   DOB: 63 y.o.   MRN: [id number]   HPI Patient presents with several different problems with 1 being obesity which is difficult for him nail disease and digital deformities bilateral which can be bothersome.  Hard for him to take care of these things himself due to his girth   ROS      Objective:  Physical Exam  Neurovascular status mildly diminished but intact bilateral with patient noted to have elongated nailbeds 1-5 both feet that are painful for him and also noted to have digital deformities of the lesser toes that are bothersome for him and hard to wear shoe gear with comfortably.  The nails are dystrophic for him     Assessment:  Hammertoe deformities lesser digits bilateral and mycotic nail infection bilateral     Plan:  H&P reviewed both conditions and at this point I went ahead and I did do some courtesy debridement to try to help him I discussed hammertoes I reviewed digital deformities consideration for some form of correction but do not recommend currently.  Patient will be seen back to recheck all questions answered today

## 2023-07-16 ENCOUNTER — Other Ambulatory Visit: Payer: Self-pay | Admitting: *Deleted

## 2023-07-16 ENCOUNTER — Encounter: Payer: Self-pay | Admitting: Internal Medicine

## 2023-07-16 ENCOUNTER — Other Ambulatory Visit: Payer: Self-pay

## 2023-07-17 NOTE — Patient Instructions (Signed)
 Visit Information  Thank you for taking time to visit with me today.   Patient has met all care management goals. Care Management case will be closed. Patient has been provided contact information should new needs arise.   Please call the care guide team at [phone] if you need to cancel, schedule, or reschedule an appointment.   Please call 911 if you are experiencing a Mental Health or Behavioral Health Crisis or need someone to talk to.  Lyndon Chenoweth, LCSW [place]  [facility], [facility] Licensed Clinical Social Worker  Direct Dial: [phone]

## 2023-07-17 NOTE — Patient Outreach (Addendum)
 Complex Care Management   Visit Note  07/17/2023  Name:  Phillip Butler. MRN: [id number] DOB: 1960/09/05  Situation: Referral received for Complex Care Management related to DME needs, adult day care options and advanced directive completion.  I obtained verbal consent from Caregiver.  Visit completed with caregiver  on the phone on 07/16/23.   Background:   Past Medical History:  Diagnosis Date   Allergic rhinitis    Anemia of chronic disease    Anxiety    Asthma    Autism    Bladder 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/29/2011   Depression    Gallstone    GERD (gastroesophageal reflux disease)    Hyperlipidemia    Hypertension    IBS (irritable bowel syndrome)    Obesity    OSA (obstructive sleep apnea)    Pneumonia    UTI (urinary tract infection)     Assessment: Patient Reported Symptoms:  Cognitive Cognitive Status: Able to follow simple commands, Alert and oriented to person, place, and time, Insightful and able to interpret abstract concepts (Per SIL) Cognitive/Intellectual Conditions Management [RPT]: Intellectual Disability   Health Maintenance Behaviors: Annual physical exam, Hobbies, Spiritual practice(s) Healing Pattern: Average Health Facilitated by: Healthy diet, Prayer/meditation  Neurological Neurological Review of Symptoms: No symptoms reported    HEENT HEENT Symptoms Reported: No symptoms reported      Cardiovascular Cardiovascular Symptoms Reported: No symptoms reported    Respiratory Respiratory Symptoms Reported: No symptoms reported    Endocrine Patient reports the following symptoms related to hypoglycemia or hyperglycemia : No symptoms reported    Gastrointestinal Gastrointestinal Symptoms Reported: No symptoms reported      Genitourinary Genitourinary Symptoms Reported: No symptoms reported    Integumentary Integumentary Symptoms Reported: No symptoms reported    Musculoskeletal Musculoskelatal Symptoms Reviewed: Weakness Musculoskeletal Conditions:  Unsteady gait Musculoskeletal Management Strategies: Coping strategies, Medication therapy Musculoskeletal Comment: continues  to get stronger-continues to focus on weight loss and excercise daily(15 minutes approx. per day)      Psychosocial Psychosocial Symptoms Reported: No symptoms reported            05/22/2023    9:38 AM  Depression screen PHQ 2/9  Decreased Interest 0  Down, Depressed, Hopeless 0  PHQ - 2 Score 0    There were no vitals filed for this visit.  Medications Reviewed Today   Medications were not reviewed in this encounter     Recommendation:   PCP Follow-up Specialty provider follow-up as scheduled  Follow Up Plan:   Patient has met all care management goals. Care Management case will be closed. Patient has been provided contact information should new needs arise.   Jorgeluis Gurganus, LCSW   [place], [facility] Licensed Clinical Social Worker  Direct Dial: [phone]

## 2023-07-18 DIAGNOSIS — I5033 Acute on chronic diastolic (congestive) heart failure: Secondary | ICD-10-CM | POA: Diagnosis not present

## 2023-07-24 ENCOUNTER — Institutional Professional Consult (permissible substitution) (INDEPENDENT_AMBULATORY_CARE_PROVIDER_SITE_OTHER): Admitting: Otolaryngology

## 2023-08-04 ENCOUNTER — Telehealth: Payer: Self-pay | Admitting: Pharmacist

## 2023-08-04 NOTE — Progress Notes (Signed)
 Pharmacy Quality Measure Review  This patient is appearing on a report for being at risk of failing the adherence measure for diabetes medications this calendar year.   Medication: Mounjaro  5 mg Last fill date: 07/22/23 for 84 day supply  Insurance report was not up to date. No action needed at this time.    Darrelyn Drum, PharmD, BCPS, CPP Clinical Pharmacist Practitioner [facility] [phone]

## 2023-08-17 DIAGNOSIS — I5033 Acute on chronic diastolic (congestive) heart failure: Secondary | ICD-10-CM | POA: Diagnosis not present

## 2023-09-11 ENCOUNTER — Institutional Professional Consult (permissible substitution) (INDEPENDENT_AMBULATORY_CARE_PROVIDER_SITE_OTHER): Admitting: Otolaryngology

## 2023-09-17 DIAGNOSIS — I5033 Acute on chronic diastolic (congestive) heart failure: Secondary | ICD-10-CM | POA: Diagnosis not present

## 2023-09-19 ENCOUNTER — Institutional Professional Consult (permissible substitution) (INDEPENDENT_AMBULATORY_CARE_PROVIDER_SITE_OTHER): Admitting: Otolaryngology

## 2023-10-09 ENCOUNTER — Other Ambulatory Visit: Payer: Self-pay | Admitting: Internal Medicine

## 2023-10-09 NOTE — Telephone Encounter (Signed)
 Copied from CRM [id number]. Topic: Clinical - Medication Refill >> Oct 09, 2023  4:35 PM Leah C wrote: Medication: tirzepatide  (MOUNJARO ) 5 MG/0.5ML Pen  Has the patient contacted their pharmacy? No    This is the patient's preferred pharmacy:  [id number] [place], [place] - [address] [place] [address] Phone: [phone] Fax: [phone]  Is this the correct pharmacy for this prescription? Yes If no, delete pharmacy and type the correct one.   Has the prescription been filled recently? Yes  Is the patient out of the medication? Patient only has one more left for next week.   Has the patient been seen for an appointment in the last year OR does the patient have an upcoming appointment? Yes  Can we respond through MyChart? Yes  Agent: Please be advised that Rx refills may take up to 3 business days. We ask that you follow-up with your pharmacy.

## 2023-10-10 MED ORDER — TIRZEPATIDE 5 MG/0.5ML ~~LOC~~ SOAJ: 5.0000 mg | SUBCUTANEOUS | 3 refills | Status: AC

## 2023-10-20 ENCOUNTER — Telehealth (INDEPENDENT_AMBULATORY_CARE_PROVIDER_SITE_OTHER): Payer: Self-pay | Admitting: Otolaryngology

## 2023-10-20 NOTE — Telephone Encounter (Signed)
 error

## 2023-10-27 ENCOUNTER — Institutional Professional Consult (permissible substitution) (INDEPENDENT_AMBULATORY_CARE_PROVIDER_SITE_OTHER): Admitting: Otolaryngology

## 2023-11-13 ENCOUNTER — Other Ambulatory Visit: Payer: Self-pay | Admitting: Internal Medicine

## 2023-11-17 DIAGNOSIS — I5033 Acute on chronic diastolic (congestive) heart failure: Secondary | ICD-10-CM | POA: Diagnosis not present

## 2023-12-03 ENCOUNTER — Encounter: Payer: Self-pay | Admitting: Internal Medicine

## 2023-12-03 ENCOUNTER — Ambulatory Visit: Admitting: Internal Medicine

## 2023-12-03 VITALS — BP 122/78 | HR 94 | Temp 98.0°F | Ht 70.0 in | Wt 272.4 lb

## 2023-12-03 DIAGNOSIS — M67472 Ganglion, left ankle and foot: Secondary | ICD-10-CM

## 2023-12-03 DIAGNOSIS — I10 Essential (primary) hypertension: Secondary | ICD-10-CM

## 2023-12-03 DIAGNOSIS — Z23 Encounter for immunization: Secondary | ICD-10-CM

## 2023-12-03 DIAGNOSIS — E7800 Pure hypercholesterolemia, unspecified: Secondary | ICD-10-CM

## 2023-12-03 DIAGNOSIS — I824Z2 Acute embolism and thrombosis of unspecified deep veins of left distal lower extremity: Secondary | ICD-10-CM

## 2023-12-03 DIAGNOSIS — E1165 Type 2 diabetes mellitus with hyperglycemia: Secondary | ICD-10-CM

## 2023-12-03 DIAGNOSIS — H6123 Impacted cerumen, bilateral: Secondary | ICD-10-CM | POA: Diagnosis not present

## 2023-12-03 DIAGNOSIS — E559 Vitamin D deficiency, unspecified: Secondary | ICD-10-CM

## 2023-12-03 MED ORDER — ONDANSETRON 4 MG PO TBDP: 4.0000 mg | ORAL_TABLET | Freq: Three times a day (TID) | ORAL | 1 refills | Status: AC | PRN

## 2023-12-03 MED ORDER — APIXABAN 5 MG PO TABS: 5.0000 mg | ORAL_TABLET | Freq: Two times a day (BID) | ORAL | 3 refills | Status: AC

## 2023-12-03 MED ORDER — PANTOPRAZOLE SODIUM 40 MG PO TBEC: 40.0000 mg | DELAYED_RELEASE_TABLET | Freq: Every day | ORAL | 3 refills | Status: AC

## 2023-12-03 NOTE — Patient Instructions (Addendum)
 You had the flu shot today  Your ears were cleared of wax today  Please continue all other medications as before, and refills have been done for the vomiting medication refill  Please have the pharmacy call with any other refills you may need.  Please continue your efforts at being more active, low cholesterol diet, and weight control  Please keep your appointments with your specialists as you may have planned  You will be contacted regarding the referral for: podiatry foot doctor  Please go to the LAB at the blood drawing area for the tests to be done  You will be contacted by phone if any changes need to be made immediately.  Otherwise, you will receive a letter about your results with an explanation, but please check with MyChart first.  Please make an Appointment to return in 6 months, or sooner if needed

## 2023-12-03 NOTE — Progress Notes (Signed)
 PRE-PROCEDURE EXAM: Bilateral TM cannot be visualized due to total occlusion/impaction of the ear canal.  PROCEDURE INDICATION: remove wax to visualize ear drum & relieve discomfort  CONSENT:  Verbal     PROCEDURE NOTE:     Bilateral EAR:  I used a metal wax curette under direct vision with an otoscope to free the wax bolus from the ear wall and then successfully removed a small bit of wax. The ear was then irrigated with warm water to remove the remaining wax.  POST- PROCEDURE EXAM: Bilateral successfully visualized and found to have no erythema     The patient tolerated the procedure well.

## 2023-12-03 NOTE — Progress Notes (Signed)
 Patient ID: Carlin KANDICE Claudene Mickey., male   DOB: 1960/09/07, 63 y.o.   MRN: [id number]        Chief Complaint: follow up HTN, HLD, DM  bilateral cerumen impaction, low vit d, chronic anticoagulation       HPI:  Phillip Butler. is a 63 y.o. male here overall doing ok,  Pt denies chest pain, increased sob or doe, wheezing, orthopnea, PND, increased LE swelling, palpitations, dizziness or syncope.   Pt denies polydipsia, polyuria, or new focal neuro s/s.    Pt denies fever, night sweats, loss of appetite, or other constitutional symptoms, but has lost significant weight 50 lbs in the past 6 months now taking mounjaro  5 mg weekly.  Does have again reduced hearing and bilateral impacted cerumen.  Despite wt loss has ongoing ganglion cyst pain to left foot plantar.  Needs eliquis  refill as ran out several wks ago, Needs zofran  refill.  Due for flu shot.  Wt Readings from Last 3 Encounters:  12/03/23 272 lb 6 oz (123.5 kg)  05/22/23 (!) 321 lb (145.6 kg)  05/06/23 (!) 321 lb (145.6 kg)   BP Readings from Last 3 Encounters:  12/03/23 122/78  05/05/23 (!) 140/84  10/10/22 138/80         Past Medical History:  Diagnosis Date   Allergic rhinitis    Anemia of chronic disease    Anxiety    Asthma    Autism    Bladder 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/29/2011   Depression    Gallstone    GERD (gastroesophageal reflux disease)    Hyperlipidemia    Hypertension    IBS (irritable bowel syndrome)    Obesity    OSA (obstructive sleep apnea)    Pneumonia    UTI (urinary tract infection)    Past Surgical History:  Procedure Laterality Date   ORIF ANKLE FRACTURE Left 03/02/2015   Procedure: OPEN REDUCTION INTERNAL FIXATION (ORIF) LEFT ANKLE TRIMALLEOLAR FRACTURE;  Surgeon: Norleen Armor, MD;  Location: [facility] OR;  Service: Orthopedics;  Laterality: Left;    reports that he has never smoked. He has never used smokeless tobacco. He reports that he does not drink alcohol  and does not use drugs. family history includes  Alcohol  abuse in an other family member; Arthritis in an other family member; Breast cancer in an other family member; Diabetes in an other family member; Heart disease in his father and another family member; Hyperlipidemia in an other family member; Irritable bowel syndrome in his mother; Kidney disease in an other family member; Pancreatic cancer in an other family member; Prostate cancer in an other family member; Stroke in an other family member. Allergies  Allergen Reactions   Fluoxetine  Hcl Other (See Comments)    ineffective   Xanax [Alprazolam] Other (See Comments)    Made patient too sleepy   Current Outpatient Medications on File Prior to Visit  Medication Sig Dispense Refill   albuterol  (VENTOLIN  HFA) 108 (90 Base) MCG/ACT inhaler TAKE 2 PUFFS BY MOUTH EVERY 6 HOURS AS NEEDED FOR WHEEZE OR SHORTNESS OF BREATH 25.5 each 3   ammonium lactate  (AMLACTIN) 12 % lotion Apply 1 Application topically as needed for dry skin. 400 g 0   aspirin  EC 81 MG tablet Take 81 mg by mouth daily.     atorvastatin  (LIPITOR) 40 MG tablet Take 1 tablet (40 mg total) by mouth daily. 90 tablet 3   Blood Glucose Monitoring Suppl (ACCU-CHEK GUIDE) w/Device KIT Use as directed twice per day  E11.9 1 kit 0   Blood Glucose Monitoring Suppl (ONETOUCH VERIO FLEX SYSTEM) w/Device KIT Use as directed four times per day E11.9 1 kit 0   buPROPion  (WELLBUTRIN  XL) 300 MG 24 hr tablet Take 1 tablet (300 mg total) by mouth daily. 90 tablet 3   clobetasol cream (TEMOVATE) 0.05 % Apply 1 Application topically 2 (two) times daily.     clonazePAM  (KLONOPIN ) 0.5 MG tablet Take 1 tablet (0.5 mg total) by mouth 2 (two) times daily as needed for anxiety. 60 tablet 1   gabapentin  (NEURONTIN ) 300 MG capsule TAKE 2 CAPSULES BY MOUTH AT BEDTIME. 180 capsule 1   halobetasol (ULTRAVATE) 0.05 % ointment Apply 1 application topically 2 (two) times daily.     hydroxypropyl methylcellulose / hypromellose (ISOPTO TEARS / GONIOVISC) 2.5 %  ophthalmic solution Place 1 drop into both eyes 3 (three) times daily as needed for dry eyes.     Lancets MISC Use as directed four times per day E11.9 400 each 3   lisinopril  (ZESTRIL ) 20 MG tablet TAKE 1 TABLET BY MOUTH EVERY DAY 90 tablet 3   metoprolol  tartrate (LOPRESSOR ) 50 MG tablet TAKE 1 TABLET BY MOUTH TWICE A DAY 180 tablet 3   mupirocin ointment (BACTROBAN) 2 % Apply 1 application topically 2 (two) times daily.     nystatin  (MYCOSTATIN /NYSTOP ) powder Use as directed twice daily as needed 45 g 3   ondansetron  (ZOFRAN ) 4 MG tablet Take 1 tablet (4 mg total) by mouth every 8 (eight) hours as needed for nausea or vomiting. 40 tablet 1   predniSONE  (DELTASONE ) 10 MG tablet 3 tabs by mouth per day for 3 days,2tabs per day for 3 days,1tab per day for 3 days 18 tablet 0   sertraline  (ZOLOFT ) 100 MG tablet TAKE 2 TABLETS BY MOUTH EVERY DAY 180 tablet 3   tamsulosin  (FLOMAX ) 0.4 MG CAPS capsule Take 1 capsule (0.4 mg total) by mouth 2 (two) times daily. (Patient taking differently: Take 0.4 mg by mouth daily.) 180 capsule 3   tirzepatide  (MOUNJARO ) 5 MG/0.5ML Pen Inject 5 mg into the skin once a week. 6 mL 3   traZODone  (DESYREL ) 100 MG tablet TAKE 1 TABLET BY MOUTH EVERY DAY AT BEDTIME AS NEEDED FOR SLEEP 90 tablet 1   triamcinolone  (NASACORT ) 55 MCG/ACT AERO nasal inhaler Place 2 sprays into the nose daily. 1 Inhaler 12   triamcinolone  cream (KENALOG ) 0.1 % Apply 1 application. topically 2 (two) times daily. To legs for itching 453.6 g 0   furosemide  (LASIX ) 40 MG tablet Take 1 tablet (40 mg total) by mouth daily. 90 tablet 3   No current facility-administered medications on file prior to visit.        ROS:  All others reviewed and negative.  Objective        PE:  BP 122/78   Pulse 94   Temp 98 F (36.7 C) (Temporal)   Ht 5' 10 (1.778 m)   Wt 272 lb 6 oz (123.5 kg)   SpO2 96%   BMI 39.08 kg/m                 Constitutional: Pt appears in NAD               HENT: Head: NCAT.                 Right Ear: External ear normal.                 Left  Ear: External ear normal. Bilateral wax impactions resolved               Eyes: . Pupils are equal, round, and reactive to light. Conjunctivae and EOM are normal               Nose: without d/c or deformity               Neck: Neck supple. Gross normal ROM               Cardiovascular: Normal rate and regular rhythm.                 Pulmonary/Chest: Effort normal and breath sounds without rales or wheezing.                Abd:  Soft, NT, ND, + BS, no organomegaly               Neurological: Pt is alert. At baseline orientation, motor grossly intact               Skin: Skin is warm. No rashes, no other new lesions, LE edema - none; left foot with persistent tender swelling ganglion cyst plantar instep               Psychiatric: Pt behavior is normal without agitation   Micro: none  Cardiac tracings I have personally interpreted today:  none  Pertinent Radiological findings (summarize): none   Lab Results  Component Value Date   WBC 6.6 04/09/2022   HGB 12.0 (L) 04/09/2022   HCT 36.8 (L) 04/09/2022   PLT 225.0 04/09/2022   GLUCOSE 95 10/10/2022   CHOL 168 10/10/2022   TRIG 108.0 10/10/2022   HDL 48.50 10/10/2022   LDLDIRECT 77.0 05/10/2019   LDLCALC 97 10/10/2022   ALT 23 10/10/2022   AST 24 10/10/2022   NA 143 10/10/2022   K 4.4 10/10/2022   CL 106 10/10/2022   CREATININE 1.74 (H) 10/10/2022   BUN 21 10/10/2022   CO2 30 10/10/2022   TSH <0.01 (L) 10/10/2022   PSA 0.52 04/09/2022   INR 1.05 03/02/2015   HGBA1C 5.7 10/10/2022   Assessment/Plan:  Jourden Gilson. is a 63 y.o. White or Caucasian [1] male with  has a past medical history of Allergic rhinitis, Anemia of chronic disease, Anxiety, Asthma, Autism, Bladder neck obstruction (05/29/2011), Depression, Gallstone, GERD (gastroesophageal reflux disease), Hyperlipidemia, Hypertension, IBS (irritable bowel syndrome), Obesity, OSA (obstructive sleep apnea),  Pneumonia, and UTI (urinary tract infection).  DVT, lower extremity, distal, acute (HCC) Hx of severe, for chronic anticoagulation - restart eliquis  5 bid  Vitamin D  deficiency Last vitamin D  Lab Results  Component Value Date   VD25OH <7.00 (L) 04/09/2022   Low, to start oral replacement   Type 2 diabetes mellitus with hyperglycemia, without long-term current use of insulin  (HCC) Lab Results  Component Value Date   HGBA1C 5.7 10/10/2022   Stable, pt to continue current medical treatment mounjaro  5 mg weekly and monitor further wt loss   Hyperlipidemia Lab Results  Component Value Date   LDLCALC 97 10/10/2022   Uncontrolled, cont increased lipitor 40 mg every day with good compliance, for f/u lab soon   Essential hypertension BP Readings from Last 3 Encounters:  12/03/23 122/78  05/05/23 (!) 140/84  10/10/22 138/80   Stable, pt to continue medical treatment lisinopril  20 every day, lopressor  50 bid   Cerumen impaction Resolved with improved hearing,  to f/u any worsening  symptoms or concerns  Ganglion cyst of left foot Pt requests referral to podiatry as still symptomatic  Followup: Return in about 6 months (around 06/01/2024).  Lynwood Rush, MD 12/06/2023 2:14 PM [facility]

## 2023-12-06 NOTE — Assessment & Plan Note (Signed)
 Hx of severe, for chronic anticoagulation - restart eliquis  5 bid

## 2023-12-06 NOTE — Assessment & Plan Note (Signed)
 Lab Results  Component Value Date   HGBA1C 5.7 10/10/2022   Stable, pt to continue current medical treatment mounjaro  5 mg weekly and monitor further wt loss

## 2023-12-06 NOTE — Assessment & Plan Note (Signed)
 Pt requests referral to podiatry as still symptomatic

## 2023-12-06 NOTE — Assessment & Plan Note (Signed)
 Resolved with improved hearing,  to f/u any worsening symptoms or concerns

## 2023-12-06 NOTE — Assessment & Plan Note (Signed)
 BP Readings from Last 3 Encounters:  12/03/23 122/78  05/05/23 (!) 140/84  10/10/22 138/80   Stable, pt to continue medical treatment lisinopril  20 every day, lopressor  50 bid

## 2023-12-06 NOTE — Assessment & Plan Note (Signed)
 Lab Results  Component Value Date   LDLCALC 97 10/10/2022   Uncontrolled, cont increased lipitor 40 mg every day with good compliance, for f/u lab soon

## 2023-12-06 NOTE — Assessment & Plan Note (Signed)
 Last vitamin D Lab Results  Component Value Date   VD25OH <7.00 (L) 04/09/2022   Low, to start oral replacement

## 2023-12-17 ENCOUNTER — Telehealth: Payer: Self-pay | Admitting: Pharmacist

## 2023-12-17 DIAGNOSIS — E7800 Pure hypercholesterolemia, unspecified: Secondary | ICD-10-CM

## 2023-12-17 MED ORDER — ATORVASTATIN CALCIUM 40 MG PO TABS: 40.0000 mg | ORAL_TABLET | Freq: Every day | ORAL | 3 refills | Status: AC

## 2023-12-17 NOTE — Telephone Encounter (Signed)
 Pharmacy Quality Measure Review  This patient is appearing on a report for being at risk of failing the Glycemic Status Assessment in Diabetes measure this calendar year.   Last documented A1c or GMI 5.7% on 10/10/22   and This patient is appearing on the insurance-providing list for being at risk of failing the adherence measure for Statin Use in Persons with Diabetes (SUPD) medications this calendar year.   Medication: Atorvastatin  Last fill date: unknown  **Atorvastatin  Rx has not been sent since 2021 per chart and patient has not filled an Rx for atorvastatin  in at least 3 years per fill history.   Pt is needing labs for this year (labs are entered, have not been collected) and is needing Rx for atorvastatin . Just saw PCP 12/03/23. Will send atorvastatin  refill.  Darrelyn Drum, PharmD, BCPS, CPP Clinical Pharmacist Practitioner [facility] [phone]

## 2023-12-22 NOTE — Telephone Encounter (Signed)
 Pt states he will come by the office on Friday 12/26/23 to have his labs drawn.

## 2023-12-31 ENCOUNTER — Other Ambulatory Visit

## 2023-12-31 DIAGNOSIS — E1165 Type 2 diabetes mellitus with hyperglycemia: Secondary | ICD-10-CM

## 2023-12-31 DIAGNOSIS — E559 Vitamin D deficiency, unspecified: Secondary | ICD-10-CM | POA: Diagnosis not present

## 2023-12-31 DIAGNOSIS — Z125 Encounter for screening for malignant neoplasm of prostate: Secondary | ICD-10-CM | POA: Diagnosis not present

## 2023-12-31 DIAGNOSIS — E538 Deficiency of other specified B group vitamins: Secondary | ICD-10-CM | POA: Diagnosis not present

## 2023-12-31 LAB — BASIC METABOLIC PANEL WITH GFR
BUN: 24 mg/dL — ABNORMAL HIGH (ref 6–23)
CO2: 30 meq/L (ref 19–32)
Calcium: 9.2 mg/dL (ref 8.4–10.5)
Chloride: 105 meq/L (ref 96–112)
Creatinine, Ser: 1.99 mg/dL — ABNORMAL HIGH (ref 0.40–1.50)
GFR: 34.96 mL/min — ABNORMAL LOW (ref >60.00)
Glucose, Bld: 102 mg/dL — ABNORMAL HIGH (ref 70–99)
Potassium: 4.2 meq/L (ref 3.5–5.1)
Sodium: 143 meq/L (ref 135–145)

## 2023-12-31 LAB — LIPID PANEL
Cholesterol: 138 mg/dL (ref 0–200)
HDL: 50.6 mg/dL (ref >39.00)
LDL Cholesterol: 67 mg/dL (ref 0–99)
NonHDL: 87.59
Total CHOL/HDL Ratio: 3
Triglycerides: 103 mg/dL (ref 0.0–149.0)
VLDL: 20.6 mg/dL (ref 0.0–40.0)

## 2023-12-31 LAB — URINALYSIS, ROUTINE W REFLEX MICROSCOPIC
Bilirubin Urine: NEGATIVE
Hgb urine dipstick: NEGATIVE
Ketones, ur: NEGATIVE
Leukocytes,Ua: NEGATIVE
Nitrite: NEGATIVE
RBC / HPF: NONE SEEN (ref 0–?)
Specific Gravity, Urine: 1.025 (ref 1.000–1.030)
Total Protein, Urine: 100 — AB
Urine Glucose: NEGATIVE
Urobilinogen, UA: 1 (ref 0.0–1.0)
pH: 6 (ref 5.0–8.0)

## 2023-12-31 LAB — VITAMIN B12: Vitamin B-12: 674 pg/mL (ref 211–911)

## 2023-12-31 LAB — CBC WITH DIFFERENTIAL/PLATELET
Basophils Absolute: 0 K/uL (ref 0.0–0.1)
Basophils Relative: 0.7 % (ref 0.0–3.0)
Eosinophils Absolute: 0.3 K/uL (ref 0.0–0.7)
Eosinophils Relative: 5.4 % — ABNORMAL HIGH (ref 0.0–5.0)
HCT: 36.5 % — ABNORMAL LOW (ref 39.0–52.0)
Hemoglobin: 12.1 g/dL — ABNORMAL LOW (ref 13.0–17.0)
Lymphocytes Relative: 20.1 % (ref 12.0–46.0)
Lymphs Abs: 1.1 K/uL (ref 0.7–4.0)
MCHC: 33.1 g/dL (ref 30.0–36.0)
MCV: 87.5 fl (ref 78.0–100.0)
Monocytes Absolute: 0.4 K/uL (ref 0.1–1.0)
Monocytes Relative: 7.9 % (ref 3.0–12.0)
Neutro Abs: 3.6 K/uL (ref 1.4–7.7)
Neutrophils Relative %: 65.9 % (ref 43.0–77.0)
Platelets: 188 K/uL (ref 150.0–400.0)
RBC: 4.17 Mil/uL — ABNORMAL LOW (ref 4.22–5.81)
RDW: 15.1 % (ref 11.5–15.5)
WBC: 5.4 K/uL (ref 4.0–10.5)

## 2023-12-31 LAB — HEMOGLOBIN A1C: Hgb A1c MFr Bld: 5.2 % (ref 4.6–6.5)

## 2023-12-31 LAB — HEPATIC FUNCTION PANEL
ALT: 18 U/L (ref 0–53)
AST: 23 U/L (ref 0–37)
Albumin: 4.2 g/dL (ref 3.5–5.2)
Alkaline Phosphatase: 96 U/L (ref 39–117)
Bilirubin, Direct: 0.1 mg/dL (ref 0.0–0.3)
Total Bilirubin: 0.4 mg/dL (ref 0.2–1.2)
Total Protein: 7.6 g/dL (ref 6.0–8.3)

## 2023-12-31 LAB — PSA: PSA: 0.62 ng/mL (ref 0.10–4.00)

## 2023-12-31 LAB — TSH: TSH: 0.76 u[IU]/mL (ref 0.35–5.50)

## 2023-12-31 LAB — VITAMIN D 25 HYDROXY (VIT D DEFICIENCY, FRACTURES): VITD: 33.16 ng/mL (ref 30.00–100.00)

## 2024-01-01 ENCOUNTER — Ambulatory Visit: Payer: Self-pay | Admitting: Internal Medicine

## 2024-01-01 ENCOUNTER — Other Ambulatory Visit: Payer: Self-pay | Admitting: Internal Medicine

## 2024-01-01 DIAGNOSIS — N1831 Chronic kidney disease, stage 3a: Secondary | ICD-10-CM | POA: Insufficient documentation

## 2024-01-01 LAB — MICROALBUMIN / CREATININE URINE RATIO
Creatinine,U: 156.1 mg/dL
Microalb Creat Ratio: 446.2 mg/g — ABNORMAL HIGH (ref 0.0–30.0)
Microalb, Ur: 69.7 mg/dL — ABNORMAL HIGH (ref 0.0–1.9)

## 2024-01-06 ENCOUNTER — Encounter: Payer: Self-pay | Admitting: *Deleted

## 2024-01-06 NOTE — Progress Notes (Signed)
 Phillip Butler.                                          MRN: [id number]   01/06/2024   The [facility] Quality Team Specialist reviewed this patient medical record for the purposes of chart review for care gap closure. The following were reviewed: abstraction for care gap closure-glycemic status assessment and kidney health evaluation for diabetes:eGFR  and uACR.    [facility] Quality Team

## 2024-01-06 NOTE — Progress Notes (Signed)
 Phillip Butler Phillip Butler Phillip Butler Phillip Butler.                                          MRN: [id number]   01/06/2024   The [facility] Quality Team Specialist reviewed this patient medical record for the purposes of chart review for care gap closure. The following were reviewed: chart review for care gap closure-colorectal cancer screening.    [facility] Quality Team

## 2024-01-16 ENCOUNTER — Encounter: Payer: Self-pay | Admitting: Pharmacist

## 2024-01-16 NOTE — Progress Notes (Signed)
 Pharmacy Quality Measure Review  This patient is appearing on a report for being at risk of failing the adherence measure for diabetes medications this calendar year.   Medication: Mounjaro  Last fill date: 01/13/24 for 84 day supply  Insurance report was not up to date. No action needed at this time.   Darrelyn Drum, PharmD, BCPS, CPP Clinical Pharmacist Practitioner [facility] [phone]

## 2024-02-13 ENCOUNTER — Other Ambulatory Visit: Payer: Self-pay | Admitting: Internal Medicine

## 2024-02-19 ENCOUNTER — Ambulatory Visit: Payer: Self-pay

## 2024-02-19 NOTE — Telephone Encounter (Signed)
 FYI Only or Action Required?: Action required by provider: request for appointment.  Patient was last seen in primary care on 12/03/2023 by Norleen Lynwood ORN, MD.  Called Nurse Triage reporting Testicle Pain.  Symptoms began several weeks ago.  Interventions attempted: Nothing.  Symptoms are: unchanged. Left scrotal burning, redness x 2 weeks. Requests appointment next week due to weather. Instructed to call back for worsening of symptoms.  Triage Disposition: See PCP When Office is Open (Within 3 Days)  Patient/caregiver understands and will follow disposition?: Yes       Reason for Triage: Patient is having severe burning, pain on and inside  Scrotum.   Has been going on for 2 weeks.  - Requesting appt for next week - February    Reason for Disposition  [1] Brief pain in scrotum or testicle AND [2] present < 1 hour AND [3] recurrent  (NO swelling)  Answer Assessment - Initial Assessment Questions 1. LOCATION and RADIATION: Where is the pain located?      left 2. QUALITY: What does the pain feel like?  (e.g., sharp, dull, aching, burning)     burning 3. SEVERITY: How bad is the pain?  (Scale 1-10; or mild, moderate, severe)   - MILD (1-3): doesn't interfere with normal activities    - MODERATE (4-7): interferes with normal activities (e.g., work or school) or awakens from sleep   - SEVERE (8-10): excruciating pain, unable to do any normal activities, difficulty walking     5 4. ONSET: When did the pain start?     1 week 5. PATTERN: Does it come and go, or has it been constant since it started?     Comes and goes 6. SCROTAL APPEARANCE: What does the scrotum look like? Is there any swelling or redness?      redness 7. HERNIA: Has a doctor ever told you that you have a hernia?     no 8. OTHER SYMPTOMS: Do you have any other symptoms? (e.g., fever, abdominal pain, vomiting, difficulty passing urine)     Fever at times, difficulty with urination at  times  Protocols used: Scrotal Pain-A-AH

## 2024-02-22 ENCOUNTER — Other Ambulatory Visit: Payer: Self-pay | Admitting: Internal Medicine

## 2024-02-23 ENCOUNTER — Other Ambulatory Visit: Payer: Self-pay

## 2024-02-23 NOTE — Telephone Encounter (Signed)
 Pt called wanting to reschedule his appt. Pt stated that his sx have improved but is worried about the weather and afraid of getting in accident. Pt advised to call back if worsens in mean time. Appt schedule 03/02/23.

## 2024-02-24 ENCOUNTER — Ambulatory Visit: Admitting: Family

## 2024-03-01 ENCOUNTER — Ambulatory Visit: Admitting: Family Medicine

## 2024-05-06 ENCOUNTER — Ambulatory Visit

## 2024-06-11 ENCOUNTER — Ambulatory Visit
